# Patient Record
Sex: Male | Born: 1960
Health system: Southern US, Community
[De-identification: ages and names within clinical notes are randomized; demographics above are authoritative.]

## PROBLEM LIST (undated history)

## (undated) ENCOUNTER — Emergency Department (HOSPITAL_COMMUNITY)
Admission: EM | Disposition: A | Discharge status: Home / Self Care | Payer: Self-pay | Source: Home / Self Care | Attending: Emergency Medicine | Admitting: Emergency Medicine

## (undated) DIAGNOSIS — G4730 Sleep apnea, unspecified: Secondary | ICD-10-CM

## (undated) DIAGNOSIS — K219 Gastro-esophageal reflux disease without esophagitis: Secondary | ICD-10-CM

## (undated) DIAGNOSIS — R05 Cough: Secondary | ICD-10-CM

## (undated) DIAGNOSIS — K5900 Constipation, unspecified: Secondary | ICD-10-CM

## (undated) DIAGNOSIS — K429 Umbilical hernia without obstruction or gangrene: Secondary | ICD-10-CM

## (undated) DIAGNOSIS — R059 Cough, unspecified: Secondary | ICD-10-CM

## (undated) DIAGNOSIS — R531 Weakness: Secondary | ICD-10-CM

## (undated) DIAGNOSIS — R51 Headache: Secondary | ICD-10-CM

## (undated) DIAGNOSIS — R519 Headache, unspecified: Secondary | ICD-10-CM

## (undated) DIAGNOSIS — R062 Wheezing: Secondary | ICD-10-CM

## (undated) DIAGNOSIS — Z8719 Personal history of other diseases of the digestive system: Secondary | ICD-10-CM

## (undated) DIAGNOSIS — I10 Essential (primary) hypertension: Secondary | ICD-10-CM

## (undated) DIAGNOSIS — B009 Herpesviral infection, unspecified: Secondary | ICD-10-CM

## (undated) DIAGNOSIS — R011 Cardiac murmur, unspecified: Secondary | ICD-10-CM

## (undated) DIAGNOSIS — R7303 Prediabetes: Secondary | ICD-10-CM

## (undated) HISTORY — DX: Wheezing: R06.2

## (undated) HISTORY — DX: Umbilical hernia without obstruction or gangrene: K42.9

## (undated) HISTORY — DX: Weakness: R53.1

## (undated) HISTORY — DX: Gastro-esophageal reflux disease without esophagitis: K21.9

## (undated) HISTORY — PX: TOE SURGERY: SHX1073

## (undated) HISTORY — DX: Headache: R51

## (undated) HISTORY — PX: FEMUR SURGERY: SHX943

## (undated) HISTORY — DX: Headache, unspecified: R51.9

## (undated) HISTORY — PX: CARPAL TUNNEL RELEASE: SHX101

## (undated) HISTORY — DX: Essential (primary) hypertension: I10

## (undated) HISTORY — DX: Constipation, unspecified: K59.00

## (undated) HISTORY — PX: KNEE ARTHROSCOPY: SUR90

## (undated) HISTORY — DX: Cough, unspecified: R05.9

## (undated) HISTORY — DX: Cough: R05

---

## 1998-06-30 ENCOUNTER — Encounter: Payer: Self-pay | Admitting: Emergency Medicine

## 1998-06-30 ENCOUNTER — Emergency Department (HOSPITAL_COMMUNITY): Admission: EM | Admit: 1998-06-30 | Discharge: 1998-06-30 | Payer: Self-pay | Admitting: Emergency Medicine

## 2003-03-02 ENCOUNTER — Encounter: Admission: RE | Admit: 2003-03-02 | Discharge: 2003-03-02 | Payer: Self-pay | Admitting: Emergency Medicine

## 2003-03-10 ENCOUNTER — Encounter: Admission: RE | Admit: 2003-03-10 | Discharge: 2003-03-10 | Payer: Self-pay | Admitting: Emergency Medicine

## 2003-03-16 ENCOUNTER — Encounter: Admission: RE | Admit: 2003-03-16 | Discharge: 2003-03-16 | Payer: Self-pay | Admitting: Emergency Medicine

## 2003-07-20 ENCOUNTER — Encounter: Admission: RE | Admit: 2003-07-20 | Discharge: 2003-07-20 | Payer: Self-pay | Admitting: Emergency Medicine

## 2003-11-25 ENCOUNTER — Encounter: Admission: RE | Admit: 2003-11-25 | Discharge: 2003-11-25 | Payer: Self-pay | Admitting: Emergency Medicine

## 2004-02-09 ENCOUNTER — Ambulatory Visit: Payer: Self-pay | Admitting: Psychology

## 2004-02-28 ENCOUNTER — Ambulatory Visit: Payer: Self-pay | Admitting: Psychology

## 2004-03-13 ENCOUNTER — Ambulatory Visit: Payer: Self-pay | Admitting: Psychology

## 2004-03-26 ENCOUNTER — Ambulatory Visit: Payer: Self-pay | Admitting: Psychology

## 2004-04-12 ENCOUNTER — Ambulatory Visit: Payer: Self-pay | Admitting: Psychology

## 2004-04-28 ENCOUNTER — Ambulatory Visit: Payer: Self-pay | Admitting: Psychology

## 2004-05-04 ENCOUNTER — Ambulatory Visit: Payer: Self-pay | Admitting: Psychology

## 2004-05-18 ENCOUNTER — Ambulatory Visit: Payer: Self-pay | Admitting: Psychology

## 2004-06-28 ENCOUNTER — Ambulatory Visit: Payer: Self-pay | Admitting: Psychology

## 2004-07-12 ENCOUNTER — Ambulatory Visit: Payer: Self-pay | Admitting: Psychology

## 2004-07-26 ENCOUNTER — Ambulatory Visit: Payer: Self-pay | Admitting: Psychology

## 2004-08-09 ENCOUNTER — Ambulatory Visit: Payer: Self-pay | Admitting: Psychology

## 2004-08-23 ENCOUNTER — Ambulatory Visit: Payer: Self-pay | Admitting: Psychology

## 2004-09-20 ENCOUNTER — Ambulatory Visit: Payer: Self-pay | Admitting: Psychology

## 2004-10-04 ENCOUNTER — Ambulatory Visit: Payer: Self-pay | Admitting: Psychology

## 2004-10-18 ENCOUNTER — Ambulatory Visit: Payer: Self-pay | Admitting: Psychology

## 2004-11-01 ENCOUNTER — Ambulatory Visit: Payer: Self-pay | Admitting: Psychology

## 2004-11-15 ENCOUNTER — Ambulatory Visit: Payer: Self-pay | Admitting: Psychology

## 2004-11-29 ENCOUNTER — Ambulatory Visit: Payer: Self-pay | Admitting: Psychology

## 2004-12-13 ENCOUNTER — Ambulatory Visit: Payer: Self-pay | Admitting: Psychology

## 2005-01-10 ENCOUNTER — Ambulatory Visit: Payer: Self-pay | Admitting: Psychology

## 2005-03-07 ENCOUNTER — Ambulatory Visit: Payer: Self-pay | Admitting: Psychology

## 2005-04-04 ENCOUNTER — Ambulatory Visit: Payer: Self-pay | Admitting: Psychology

## 2005-04-29 ENCOUNTER — Encounter: Admission: RE | Admit: 2005-04-29 | Discharge: 2005-04-29 | Payer: Self-pay | Admitting: Emergency Medicine

## 2005-05-02 ENCOUNTER — Ambulatory Visit: Payer: Self-pay | Admitting: Psychology

## 2005-06-20 ENCOUNTER — Ambulatory Visit: Payer: Self-pay | Admitting: Psychology

## 2005-07-18 ENCOUNTER — Ambulatory Visit: Payer: Self-pay | Admitting: Psychology

## 2005-08-15 ENCOUNTER — Ambulatory Visit: Payer: Self-pay | Admitting: Psychology

## 2005-09-19 ENCOUNTER — Ambulatory Visit: Payer: Self-pay | Admitting: Psychology

## 2005-10-17 ENCOUNTER — Ambulatory Visit: Payer: Self-pay | Admitting: Psychology

## 2005-12-12 ENCOUNTER — Ambulatory Visit: Payer: Self-pay | Admitting: Psychology

## 2006-01-23 ENCOUNTER — Ambulatory Visit: Payer: Self-pay | Admitting: Psychology

## 2006-02-20 ENCOUNTER — Ambulatory Visit: Payer: Self-pay | Admitting: Psychology

## 2006-03-27 ENCOUNTER — Ambulatory Visit: Payer: Self-pay | Admitting: Psychology

## 2007-03-01 ENCOUNTER — Emergency Department (HOSPITAL_COMMUNITY): Admission: EM | Admit: 2007-03-01 | Discharge: 2007-03-01 | Payer: Self-pay | Admitting: Emergency Medicine

## 2008-06-21 ENCOUNTER — Emergency Department (HOSPITAL_COMMUNITY): Admission: EM | Admit: 2008-06-21 | Discharge: 2008-06-21 | Payer: Self-pay | Admitting: Family Medicine

## 2009-04-27 ENCOUNTER — Encounter: Payer: Self-pay | Admitting: Cardiovascular Disease

## 2009-12-13 ENCOUNTER — Emergency Department (HOSPITAL_COMMUNITY): Admission: EM | Admit: 2009-12-13 | Discharge: 2009-12-13 | Payer: Self-pay | Admitting: Family Medicine

## 2010-02-25 ENCOUNTER — Emergency Department (HOSPITAL_COMMUNITY)
Admission: EM | Admit: 2010-02-25 | Discharge: 2010-02-25 | Payer: Self-pay | Source: Home / Self Care | Admitting: Emergency Medicine

## 2010-05-07 LAB — CBC
HCT: 48.4 % (ref 39.0–52.0)
MCHC: 34.9 g/dL (ref 30.0–36.0)
MCV: 90 fL (ref 78.0–100.0)
RBC: 5.38 MIL/uL (ref 4.22–5.81)
RDW: 14.8 % (ref 11.5–15.5)
WBC: 7.9 10*3/uL (ref 4.0–10.5)

## 2010-05-07 LAB — DIFFERENTIAL
Basophils Absolute: 0 10*3/uL (ref 0.0–0.1)
Basophils Relative: 0 % (ref 0–1)
Monocytes Absolute: 0.7 10*3/uL (ref 0.1–1.0)
Neutro Abs: 4.2 10*3/uL (ref 1.7–7.7)

## 2010-05-07 LAB — POCT I-STAT, CHEM 8
Chloride: 110 mEq/L (ref 96–112)
Glucose, Bld: 99 mg/dL (ref 70–99)
HCT: 51 % (ref 39.0–52.0)
Potassium: 3.8 mEq/L (ref 3.5–5.1)
Sodium: 145 mEq/L (ref 135–145)

## 2010-05-07 LAB — RAPID STREP SCREEN (MED CTR MEBANE ONLY): Streptococcus, Group A Screen (Direct): NEGATIVE

## 2010-05-09 LAB — POCT URINALYSIS DIPSTICK
Bilirubin Urine: NEGATIVE
Glucose, UA: NEGATIVE mg/dL
Ketones, ur: NEGATIVE mg/dL
Nitrite: NEGATIVE
Specific Gravity, Urine: 1.025 (ref 1.005–1.030)
pH: 5.5 (ref 5.0–8.0)

## 2010-07-05 ENCOUNTER — Emergency Department (HOSPITAL_COMMUNITY)
Admission: EM | Admit: 2010-07-05 | Discharge: 2010-07-05 | Disposition: A | Discharge status: Home / Self Care | Payer: Self-pay | Attending: Emergency Medicine | Admitting: Emergency Medicine

## 2010-07-05 DIAGNOSIS — X500XXA Overexertion from strenuous movement or load, initial encounter: Secondary | ICD-10-CM | POA: Insufficient documentation

## 2010-07-05 DIAGNOSIS — IMO0001 Reserved for inherently not codable concepts without codable children: Secondary | ICD-10-CM | POA: Insufficient documentation

## 2010-07-05 DIAGNOSIS — M25519 Pain in unspecified shoulder: Secondary | ICD-10-CM | POA: Insufficient documentation

## 2010-07-05 DIAGNOSIS — IMO0002 Reserved for concepts with insufficient information to code with codable children: Secondary | ICD-10-CM | POA: Insufficient documentation

## 2010-07-05 DIAGNOSIS — M2550 Pain in unspecified joint: Secondary | ICD-10-CM | POA: Insufficient documentation

## 2010-11-21 ENCOUNTER — Emergency Department (HOSPITAL_COMMUNITY)
Admission: EM | Admit: 2010-11-21 | Discharge: 2010-11-22 | Disposition: A | Discharge status: Home / Self Care | Payer: Self-pay | Attending: Emergency Medicine | Admitting: Emergency Medicine

## 2010-11-21 DIAGNOSIS — M7120 Synovial cyst of popliteal space [Baker], unspecified knee: Secondary | ICD-10-CM | POA: Insufficient documentation

## 2010-11-21 DIAGNOSIS — M25569 Pain in unspecified knee: Secondary | ICD-10-CM | POA: Insufficient documentation

## 2010-11-22 ENCOUNTER — Emergency Department (HOSPITAL_COMMUNITY): Payer: Self-pay

## 2010-12-13 ENCOUNTER — Emergency Department (HOSPITAL_COMMUNITY)
Admission: EM | Admit: 2010-12-13 | Discharge: 2010-12-13 | Disposition: A | Discharge status: Home / Self Care | Payer: Self-pay | Attending: Emergency Medicine | Admitting: Emergency Medicine

## 2010-12-13 DIAGNOSIS — M7120 Synovial cyst of popliteal space [Baker], unspecified knee: Secondary | ICD-10-CM | POA: Insufficient documentation

## 2010-12-13 DIAGNOSIS — M25569 Pain in unspecified knee: Secondary | ICD-10-CM | POA: Insufficient documentation

## 2010-12-13 DIAGNOSIS — M25469 Effusion, unspecified knee: Secondary | ICD-10-CM | POA: Insufficient documentation

## 2010-12-13 DIAGNOSIS — Z79899 Other long term (current) drug therapy: Secondary | ICD-10-CM | POA: Insufficient documentation

## 2010-12-16 ENCOUNTER — Emergency Department (HOSPITAL_COMMUNITY)
Admission: EM | Admit: 2010-12-16 | Discharge: 2010-12-17 | Disposition: A | Discharge status: Home / Self Care | Payer: Self-pay | Attending: Emergency Medicine | Admitting: Emergency Medicine

## 2010-12-16 DIAGNOSIS — R079 Chest pain, unspecified: Secondary | ICD-10-CM | POA: Insufficient documentation

## 2010-12-16 DIAGNOSIS — F17200 Nicotine dependence, unspecified, uncomplicated: Secondary | ICD-10-CM | POA: Insufficient documentation

## 2010-12-16 DIAGNOSIS — Z79899 Other long term (current) drug therapy: Secondary | ICD-10-CM | POA: Insufficient documentation

## 2010-12-17 ENCOUNTER — Emergency Department (HOSPITAL_COMMUNITY): Payer: Self-pay

## 2010-12-17 LAB — BASIC METABOLIC PANEL
CO2: 24 mEq/L (ref 19–32)
Calcium: 9.8 mg/dL (ref 8.4–10.5)
Chloride: 104 mEq/L (ref 96–112)
GFR calc Af Amer: 79 mL/min — ABNORMAL LOW (ref >90)
GFR calc non Af Amer: 68 mL/min — ABNORMAL LOW (ref >90)
Sodium: 138 mEq/L (ref 135–145)

## 2010-12-17 LAB — CBC
Hemoglobin: 16.4 g/dL (ref 13.0–17.0)
MCH: 30.7 pg (ref 26.0–34.0)
WBC: 8.3 10*3/uL (ref 4.0–10.5)

## 2010-12-17 LAB — DIFFERENTIAL
Basophils Absolute: 0 10*3/uL (ref 0.0–0.1)
Basophils Relative: 1 % (ref 0–1)
Eosinophils Absolute: 0.3 10*3/uL (ref 0.0–0.7)
Eosinophils Relative: 4 % (ref 0–5)
Lymphs Abs: 3.6 10*3/uL (ref 0.7–4.0)
Monocytes Relative: 7 % (ref 3–12)
Neutro Abs: 3.8 10*3/uL (ref 1.7–7.7)

## 2010-12-17 LAB — PROTIME-INR
INR: 0.9 (ref 0.00–1.49)
Prothrombin Time: 12.3 seconds (ref 11.6–15.2)

## 2010-12-17 LAB — POCT I-STAT TROPONIN I: Troponin i, poc: 0 ng/mL (ref 0.00–0.08)

## 2011-05-11 ENCOUNTER — Emergency Department (HOSPITAL_COMMUNITY)
Admission: EM | Admit: 2011-05-11 | Discharge: 2011-05-11 | Disposition: A | Discharge status: Home / Self Care | Payer: Self-pay | Attending: Emergency Medicine | Admitting: Emergency Medicine

## 2011-05-11 ENCOUNTER — Encounter (HOSPITAL_COMMUNITY): Payer: Self-pay | Admitting: *Deleted

## 2011-05-11 DIAGNOSIS — Z79899 Other long term (current) drug therapy: Secondary | ICD-10-CM | POA: Insufficient documentation

## 2011-05-11 DIAGNOSIS — M25569 Pain in unspecified knee: Secondary | ICD-10-CM | POA: Insufficient documentation

## 2011-05-11 DIAGNOSIS — Z9889 Other specified postprocedural states: Secondary | ICD-10-CM | POA: Insufficient documentation

## 2011-05-11 DIAGNOSIS — M25561 Pain in right knee: Secondary | ICD-10-CM

## 2011-05-11 DIAGNOSIS — F17200 Nicotine dependence, unspecified, uncomplicated: Secondary | ICD-10-CM | POA: Insufficient documentation

## 2011-05-11 MED ORDER — NAPROXEN 375 MG PO TABS: 375.0000 mg | ORAL_TABLET | Freq: Two times a day (BID) | ORAL | Status: DC

## 2011-05-11 MED ORDER — IBUPROFEN 800 MG PO TABS
800.0000 mg | ORAL_TABLET | Freq: Once | ORAL | Status: AC
  Administered 2011-05-11: 800 mg via ORAL
  Filled 2011-05-11: qty 1

## 2011-05-11 MED ORDER — ACETAMINOPHEN-CODEINE #3 300-30 MG PO TABS
1.0000 | ORAL_TABLET | Freq: Four times a day (QID) | ORAL | Status: AC | PRN
Start: 2011-05-11 — End: 2011-05-21

## 2011-05-11 NOTE — ED Provider Notes (Signed)
History     CSN: [id number]  Arrival date & time 05/11/11  0805   First MD Initiated Contact with Patient 05/11/11 [phone]      Chief Complaint  Patient presents with  . Knee Pain    (Consider location/radiation/quality/duration/timing/severity/associated sxs/prior treatment) HPI  Patient presents to ER with complaint of a multiple month hx of intermittent right posterior knee pain. Patient states he notices the pain the most after he walks on his treadmill for long periods of time or has to climb the hill to his mail box. Patient states pain is the same pain that he was having in September of 2012 when he had US showing Baker's cyst of right knee but states at times the severity of pain increases but he correlates the severity of pain the the amount of movement/use of knee. Patient denies any swelling or redness of knee and denies pain or swelling into calf or thigh. Patient has taken an occasional advil with mild relief of pain. He states he is currently unemployeed and therefore did not follow up with ortho as directed in September. He denies any CP or SOB. Pain aggravated by walking and use and improved with rest and NSAID. States he has no other known medical problems.   History reviewed. No pertinent past medical history.  Past Surgical History  Procedure Date  . Femur surgery     left    No family history on file.  History  Substance Use Topics  . Smoking status: Current Everyday Smoker  . Smokeless tobacco: Not on file  . Alcohol Use: Yes      Review of Systems  All other systems reviewed and are negative.    Allergies  Review of patient's allergies indicates no known allergies.  Home Medications   Current Outpatient Rx  Name Route Sig Dispense Refill  . ACYCLOVIR PO Oral Take 1 capsule by mouth 2 (two) times daily as needed. When outbreak occurs    . ALEVE PO Oral Take 1 tablet by mouth daily as needed. For pain    . ACETAMINOPHEN-CODEINE #3 300-30 MG PO TABS  Oral Take 1-2 tablets by mouth every 6 (six) hours as needed for pain. 15 tablet 0  . NAPROXEN 375 MG PO TABS Oral Take 1 tablet (375 mg total) by mouth 2 (two) times daily. 20 tablet 0    BP 127/85  Pulse 77  Temp(Src) 97.8 F (36.6 C) (Oral)  Resp 16  SpO2 95%  Physical Exam  Nursing note and vitals reviewed. Constitutional: He is oriented to person, place, and time. He appears well-developed.  HENT:  Head: Normocephalic and atraumatic.  Eyes: Conjunctivae are normal.  Neck: Normal range of motion. Neck supple.  Cardiovascular: Normal rate.   Pulmonary/Chest: Effort normal.  Musculoskeletal: Normal range of motion. He exhibits tenderness. He exhibits no edema.       Mild TTP of right lateral popliteal fossa but no fullness. Mild pain in lateral posterior knee with FROM but no crepitous. No edema, erythema or skin changes of knee. No TTP of entire right thigh and calf. Good pedal pulse bilaterally.   Neurological: He is alert and oriented to person, place, and time.  Skin: Skin is warm and dry. No rash noted. No erythema. No pallor.    ED Course  Procedures (including critical care time)  PO ibuprofen.   Labs Reviewed - No data to display No results found.   1. Right knee pain       MDM  Patient with hx of multiple month hx of intermittent right knee pain with popliteal fossa with US showing baker's cyst in 9/12 with patient complaining of the same recurrent pain that is aggravated by over use like walking on treadmill and climbing hills. No personal risk for DVT formation with no TTP or swelling of calf. Knee has no edema and no worrisome signs or symptoms of infection. Patient agreeable to NSAID use, ice and decreasing aggravating over use injury then follow up with ortho for ongoing pain recheck.         Lenon Oms [place], [place] 05/11/11 [phone]

## 2011-05-11 NOTE — ED Notes (Signed)
Pt is here with behind the right knee pain.    Pt states hard to walk yesterday and states has been bothering him for one month.

## 2011-05-11 NOTE — Discharge Instructions (Signed)
Use naprosyn as directed for inflammation and pain with tylenol #3 for breakthrough pain but do not drive or operate machinery with Tylenol #3 use. Ice knee throughout the day for additional pain relief and to reduce inflammation. Follow up with orthopedic specialist in the next 2-3 weeks for further evaluation and management of ongoing knee pain but return to ER for emergent changing or worsening of symptoms.   Knee Pain The knee is the complex joint between your thigh and your lower leg. It is made up of bones, tendons, ligaments, and cartilage. The bones that make up the knee are:  The femur in the thigh.   The tibia and fibula in the lower leg.   The patella or kneecap riding in the groove on the lower femur.  CAUSES  Knee pain is a common complaint with many causes. A few of these causes are:  Injury, such as:   A ruptured ligament or tendon injury.   Torn cartilage.   Medical conditions, such as:   Gout   Arthritis   Infections   Overuse, over training or overdoing a physical activity.  Knee pain can be minor or severe. Knee pain can accompany debilitating injury. Minor knee problems often respond well to self-care measures or get well on their own. More serious injuries may need medical intervention or even surgery. SYMPTOMS The knee is complex. Symptoms of knee problems can vary widely. Some of the problems are:  Pain with movement and weight bearing.   Swelling and tenderness.   Buckling of the knee.   Inability to straighten or extend your knee.   Your knee locks and you cannot straighten it.   Warmth and redness with pain and fever.   Deformity or dislocation of the kneecap.  DIAGNOSIS  Determining what is wrong may be very straight forward such as when there is an injury. It can also be challenging because of the complexity of the knee. Tests to make a diagnosis may include:  Your caregiver taking a history and doing a physical exam.   Routine X-rays can  be used to rule out other problems. X-rays will not reveal a cartilage tear. Some injuries of the knee can be diagnosed by:   Arthroscopy a surgical technique by which a small video camera is inserted through tiny incisions on the sides of the knee. This procedure is used to examine and repair internal knee joint problems. Tiny instruments can be used during arthroscopy to repair the torn knee cartilage (meniscus).   Arthrography is a radiology technique. A contrast liquid is directly injected into the knee joint. Internal structures of the knee joint then become visible on X-ray film.   An MRI scan is a non x-ray radiology procedure in which magnetic fields and a computer produce two- or three-dimensional images of the inside of the knee. Cartilage tears are often visible using an MRI scanner. MRI scans have largely replaced arthrography in diagnosing cartilage tears of the knee.   Blood work.   Examination of the fluid that helps to lubricate the knee joint (synovial fluid). This is done by taking a sample out using a needle and a syringe.  TREATMENT The treatment of knee problems depends on the cause. Some of these treatments are:  Depending on the injury, proper casting, splinting, surgery or physical therapy care will be needed.   Give yourself adequate recovery time. Do not overuse your joints. If you begin to get sore during workout routines, back off. Slow down or  do fewer repetitions.   For repetitive activities such as cycling or running, maintain your strength and nutrition.   Alternate muscle groups. For example if you are a weight lifter, work the upper body on one day and the lower body the next.   Either tight or weak muscles do not give the proper support for your knee. Tight or weak muscles do not absorb the stress placed on the knee joint. Keep the muscles surrounding the knee strong.   Take care of mechanical problems.   If you have flat feet, orthotics or special shoes  may help. See your caregiver if you need help.   Arch supports, sometimes with wedges on the inner or outer aspect of the heel, can help. These can shift pressure away from the side of the knee most bothered by osteoarthritis.   A brace called an "unloader" brace also may be used to help ease the pressure on the most arthritic side of the knee.   If your caregiver has prescribed crutches, braces, wraps or ice, use as directed. The acronym for this is PRICE. This means protection, rest, ice, compression and elevation.   Nonsteroidal anti-inflammatory drugs (NSAID's), can help relieve pain. But if taken immediately after an injury, they may actually increase swelling. Take NSAID's with food in your stomach. Stop them if you develop stomach problems. Do not take these if you have a history of ulcers, stomach pain or bleeding from the bowel. Do not take without your caregiver's approval if you have problems with fluid retention, heart failure, or kidney problems.   For ongoing knee problems, physical therapy may be helpful.   Glucosamine and chondroitin are over-the-counter dietary supplements. Both may help relieve the pain of osteoarthritis in the knee. These medicines are different from the usual anti-inflammatory drugs. Glucosamine may decrease the rate of cartilage destruction.   Injections of a corticosteroid drug into your knee joint may help reduce the symptoms of an arthritis flare-up. They may provide pain relief that lasts a few months. You may have to wait a few months between injections. The injections do have a small increased risk of infection, water retention and elevated blood sugar levels.   Hyaluronic acid injected into damaged joints may ease pain and provide lubrication. These injections may work by reducing inflammation. A series of shots may give relief for as long as 6 months.   Topical painkillers. Applying certain ointments to your skin may help relieve the pain and stiffness  of osteoarthritis. Ask your pharmacist for suggestions. Many over the-counter products are approved for temporary relief of arthritis pain.   In some countries, doctors often prescribe topical NSAID's for relief of chronic conditions such as arthritis and tendinitis. A review of treatment with NSAID creams found that they worked as well as oral medications but without the serious side effects.  PREVENTION  Maintain a healthy weight. Extra pounds put more strain on your joints.   Get strong, stay limber. Weak muscles are a common cause of knee injuries. Stretching is important. Include flexibility exercises in your workouts.   Be smart about exercise. If you have osteoarthritis, chronic knee pain or recurring injuries, you may need to change the way you exercise. This does not mean you have to stop being active. If your knees ache after jogging or playing basketball, consider switching to swimming, water aerobics or other low-impact activities, at least for a few days a week. Sometimes limiting high-impact activities will provide relief.   Make sure your  shoes fit well. Choose footwear that is right for your sport.   Protect your knees. Use the proper gear for knee-sensitive activities. Use kneepads when playing volleyball or laying carpet. Buckle your seat belt every time you drive. Most shattered kneecaps occur in car accidents.   Rest when you are tired.  SEEK MEDICAL CARE IF:  You have knee pain that is continual and does not seem to be getting better.  SEEK IMMEDIATE MEDICAL CARE IF:  Your knee joint feels hot to the touch and you have a high fever. MAKE SURE YOU:   Understand these instructions.   Will watch your condition.   Will get help right away if you are not doing well or get worse.  Document Released: 12/09/2006 Document Revised: 01/31/2011 Document Reviewed: 12/09/2006 [facility] Patient Information 2012 [place], [place].

## 2011-05-12 NOTE — ED Provider Notes (Signed)
Medical screening examination/treatment/procedure(s) were performed by non-physician practitioner and as supervising physician I was immediately available for consultation/collaboration.  Rmoni Keplinger R. Glayds [place], MD 05/12/11 [id number] 

## 2011-06-09 ENCOUNTER — Emergency Department (HOSPITAL_COMMUNITY)
Admission: EM | Admit: 2011-06-09 | Discharge: 2011-06-09 | Disposition: A | Discharge status: Home / Self Care | Payer: Self-pay | Attending: Emergency Medicine | Admitting: Emergency Medicine

## 2011-06-09 ENCOUNTER — Encounter (HOSPITAL_COMMUNITY): Payer: Self-pay | Admitting: *Deleted

## 2011-06-09 DIAGNOSIS — M79609 Pain in unspecified limb: Secondary | ICD-10-CM | POA: Insufficient documentation

## 2011-06-09 DIAGNOSIS — F17200 Nicotine dependence, unspecified, uncomplicated: Secondary | ICD-10-CM | POA: Insufficient documentation

## 2011-06-09 DIAGNOSIS — R209 Unspecified disturbances of skin sensation: Secondary | ICD-10-CM | POA: Insufficient documentation

## 2011-06-09 DIAGNOSIS — G5600 Carpal tunnel syndrome, unspecified upper limb: Secondary | ICD-10-CM | POA: Insufficient documentation

## 2011-06-09 MED ORDER — NAPROXEN 375 MG PO TABS: 375.0000 mg | ORAL_TABLET | Freq: Two times a day (BID) | ORAL | Status: DC

## 2011-06-09 MED ORDER — IBUPROFEN 200 MG PO TABS
400.0000 mg | ORAL_TABLET | Freq: Once | ORAL | Status: AC
  Administered 2011-06-09: 400 mg via ORAL
  Filled 2011-06-09: qty 2

## 2011-06-09 MED ORDER — PREDNISONE 20 MG PO TABS
40.0000 mg | ORAL_TABLET | Freq: Once | ORAL | Status: AC
  Administered 2011-06-09: 40 mg via ORAL
  Filled 2011-06-09: qty 2

## 2011-06-09 MED ORDER — PREDNISONE 20 MG PO TABS: 20.0000 mg | ORAL_TABLET | Freq: Every day | ORAL | Status: AC

## 2011-06-09 NOTE — ED Provider Notes (Signed)
Medical screening examination/treatment/procedure(s) were performed by non-physician practitioner and as supervising physician I was immediately available for consultation/collaboration.   Vida Roller, MD 06/09/11 [phone]

## 2011-06-09 NOTE — ED Provider Notes (Signed)
History     CSN: [id number]  Arrival date & time 06/09/11  5621   First MD Initiated Contact with Patient 06/09/11 [phone]      Chief Complaint  Patient presents with  . Hand Pain    (Consider location/radiation/quality/duration/timing/severity/associated sxs/prior treatment) HPI Comments: Patient with no significant medical history presents the emergency department with chief complaint of numbness and tingling in his right hand.  Location of tingling is the middle ring and pinky fingers.  Onset of pain is about one week ago.  Patient denies associated symptoms or injury. Patient has full range of motion but states that certain maintenance induced tingling.  In addition patient states that he feels like he's been breathing funny lately but he denies any chest pain, shortness of breath, back pain, tingling of lower extremities, neck pain, neck stiffness, fevers, night sweats, chills, IV drug use, cancer history.  Patient is a current everyday smoker.  No other complaints this time.  Note that on further questioning patient states that in his right hand he has had a history of carpal tunnel that was treated with a brace years ago, but he has never had surgery.  Patient is a 51 y.o. male presenting with hand pain. The history is provided by the patient.  Hand Pain Pertinent negatives include no abdominal pain, chest pain, chills, congestion, fever, headaches, numbness or weakness.    History reviewed. No pertinent past medical history.  Past Surgical History  Procedure Date  . Femur surgery     left    History reviewed. No pertinent family history.  History  Substance Use Topics  . Smoking status: Current Everyday Smoker  . Smokeless tobacco: Not on file  . Alcohol Use: Yes      Review of Systems  Constitutional: Negative for fever, chills and appetite change.  HENT: Negative for congestion.   Eyes: Negative for visual disturbance.  Respiratory: Negative for shortness of  breath.   Cardiovascular: Negative for chest pain and leg swelling.  Gastrointestinal: Negative for abdominal pain.  Genitourinary: Negative for dysuria, urgency and frequency.  Neurological: Negative for dizziness, syncope, weakness, light-headedness, numbness and headaches.  Psychiatric/Behavioral: Negative for confusion.  All other systems reviewed and are negative.    Allergies  Review of patient's allergies indicates no known allergies.  Home Medications   Current Outpatient Rx  Name Route Sig Dispense Refill  . ACYCLOVIR PO Oral Take 1 capsule by mouth 2 (two) times daily as needed. When outbreak occurs. Patient does not know strength.    Marland Kitchen DOXAZOSIN MESYLATE 2 MG PO TABS Oral Take 2 mg by mouth daily.    Marland Kitchen VICODIN PO Oral Take 1 tablet by mouth daily as needed. For pain    . SULFAMETHOXAZOLE-TMP DS 800-160 MG PO TABS Oral Take 1 tablet by mouth 2 (two) times daily. Took for 10 days ending 06/03/11      BP 143/93  Pulse 80  Temp(Src) 98.4 F (36.9 C) (Oral)  Resp 18  SpO2 98%  Physical Exam  Nursing note and vitals reviewed. Constitutional: He is oriented to person, place, and time. He appears well-developed and well-nourished. No distress.  HENT:  Head: Normocephalic and atraumatic.  Eyes: Conjunctivae and EOM are normal.  Neck: Normal range of motion.  Pulmonary/Chest: Effort normal.  Musculoskeletal: Normal range of motion.       Positive Tinel and Phelan sign of right hand, median nerve distribution tingling and numbness. Sensation intact. No deformity  Neurological: He is alert and  oriented to person, place, and time.  Skin: Skin is warm and dry. No rash noted. He is not diaphoretic.  Psychiatric: He has a normal mood and affect. His behavior is normal.    ED Course  Procedures (including critical care time)  Labs Reviewed - No data to display No results found.   No diagnosis found.  The patient denies any neck pain. There is no tenderness on palpation  of the cervical spine and no step-offs. The patient can look to the left and right voluntarily without pain and flex and extend the neck without pain. Cervical collar cleared.    MDM  Carpal tunnel   Pt given a volar Velcro splint.  He is being given a prescription for prednisone, anti-inflammatories, and splint.  Patient advised that if his symptoms do not resolve in one to 2 weeks that he can follow up with orthopedics to discuss possible surgery.  Patient is agreeable with plan.  Patient without evidence of neck injury and C-spine cleared.  Likely diagnosis is median nerve compression and not cervical spine related.  Patient with vital signs stable agreeable to plan.        Jaci Carrel, [place] 06/09/11 [phone]

## 2011-06-09 NOTE — ED Notes (Signed)
Ortho at bedside.

## 2011-06-09 NOTE — ED Notes (Signed)
Patient states that he had onset of pain to his right hand and numbness to finger tips x 1 week. Pt states that numbness will go into his FA as well.  Pt denies any injury.  Pt has not trauma or swelling noted.  Pt has full ROM.

## 2011-06-09 NOTE — Progress Notes (Signed)
Orthopedic Tech Progress Note Patient Details:  Samuel Irwin [birth year] [id number]  Type of Splint: Thumb velcro Splint Location: right hand Splint Interventions: Application    Saundra Gin T 06/09/2011, 8:33 AM

## 2011-06-09 NOTE — Discharge Instructions (Signed)
Carpal Tunnel Syndrome You may have carpal tunnel syndrome. This is a common condition. Carpal tunnel syndrome occurs when the tendons, bones, or ligaments in the wrist press against the median nerve as it passes into the hand.  Symptoms can include:  Intermittent numbness.   Pain or a tingling sensation in thumb and first two fingers.  The pain may radiate up to the shoulder. There may even be weakness in the hand muscles. The pain is often worse at night and in the early morning. Nerve conduction tests may be used to prove the diagnosis. Carpal tunnel syndrome is most often due to repeated movements of the hand or wrist. Other causes can include:  Prior injuries.   Diabetes.   Obesity.   Smoking.   Pregnancy. Symptoms that develop during pregnancy often stop when the pregnancy is over.  Treatment includes:  Splinting - A wrist splint helps prevent movements that irritate the nerve. Splints are especially helpful at night when the symptoms are often worse.   Ice packs - Cold packs applied to the palm side of the wrist for 20 minutes every 2 hours while awake may give some relief.   Medication - Medicine to reduce inflammation and pain are often used. Cortisone injections around the nerve may also bring improvement.  Severe cases of carpal tunnel syndrome can require surgery to relieve the pressure on the nerve. This may be necessary if there is evidence of weakness or decreased sensation in your hand, or if your symptoms do not improve with conservative treatment. See your caregiver for follow-up to be certain your condition is improving. Document Released: 03/21/2004 Document Revised: 10/24/2010 Document Reviewed: 12/18/2006 [facility] Patient Information 2012 [place], [place].Carpal Tunnel Surgery The carpal tunnel is a narrow hollow area in the wrist. It is formed by the wrist bones and ligaments. Nerves, blood vessels, and tendons on the palm side of your hand pass through the carpal  tunnel. (The palm side is the side of your hand in the direction your fingers bend.) Repeated wrist motion or certain diseases may cause swelling within the tunnel. That is why these are sometimes called repetitive trauma disorders. It is also a common problem in late pregnancy because of water retention. This swelling pinches the main nerve in the wrist (median nerve). It causes the painful condition called carpal tunnel syndrome. A feeling of "pins and needles" may be noticed in the fingers or hand. The entire arm may ache from this condition. Carpal tunnel syndrome may clear up by itself. Cortisone injections may help. An electromyogram may be needed to confirm this diagnosis. This is a test which measures nerve conduction. The nerve conduction is usually slowed in a carpal tunnel syndrome. Sometimes, an operation may be needed to free the pinched nerve.  LET YOUR CAREGIVER KNOW ABOUT:  Allergies   Medications taken including herbs, eye drops, over the counter medications, and creams   Use of steroids (by mouth or creams)   Previous problems with anesthetics or novocaine   Possibility of pregnancy, if this applies   History of blood clots (thrombophlebitis)   History of bleeding or blood problems   Previous surgery   Other health problems  RISKS AND COMPLICATIONS  Infection: A germ starts growing in the wound. This can usually be treated with antibiotics.   Damage to other organs may occur.   Bleeding following surgery can be a complication of almost all surgeries. Your surgeon takes every precaution to keep this from happening.   Recurrence (return)  of carpal tunnel syndrome following treatment is rare.  BEFORE THE PROCEDURE  Stop smoking at least two weeks prior to surgery. This lowers risk during surgery.   Stop non steroidal medicine for ten days prior to surgery. Also, do not take aspirin unless OK'd by your surgeon.   Your caregiver may tell you to stop taking certain  medicine that may affect the outcome of the surgery and your ability to heal. For example, you may need to stop taking anti-inflammatories, such as aspirin, because of possible bleeding problems. Other medicine may have interactions with anesthesia.   BE SURE TO LET YOUR CAREGIVER KNOW IF YOU HAVE BEEN ON STEROIDS (INCLUDING CREAMS) FOR LONG PERIODS OF TIME. THIS IS CRITICAL.   Your caregiver will discuss possible risks and complications with you before surgery. In addition to the usual risks of anesthesia, other common risks and complications include:   Temporary increase in pain due to surgery.   Uncorrected pain.   Infection.  You should be present 60 minutes before your procedure or as directed.  PROCEDURE  Carpal tunnel release is generally recommended if symptoms last for 6 months. Surgery involves severing the band of tissue around the wrist to reduce pressure on the median nerve. Surgery is done under local anesthesia and does not require an overnight hospital stay. Many patients require surgery on both hands. The following are types of carpal tunnel release surgery:   Open release surgery, the traditional procedure used to correct carpal tunnel syndrome, consists of making an incision up to 2 inches in the wrist and then cutting the carpal ligament to enlarge the carpal tunnel. The procedure is generally done under local anesthesia on an outpatient basis, unless there are unusual medical considerations.   Endoscopic surgery may allow faster functional recovery and less post-operative discomfort than traditional open release surgery. The surgeon makes two incisions (cuts) (about 1/2 inch each) in the wrist and palm, inserts a camera attached to a tube, looks at the tissue on a screen, and cuts the carpal ligament (the tissue that holds joints together). This two-portal endoscopic surgery, generally performed under local anesthesia, is effective and minimizes scarring and scar tenderness, if  any. One-portal endoscopic surgery for carpal tunnel syndrome is also available.  Although symptoms may be better right after surgery, full recovery from carpal tunnel surgery can take months. Some patients may have infection, nerve damage, stiffness, and pain at the scar. Sometimes the wrist loses strength because the carpal ligament is cut. Patients should take part in physical therapy after surgery to restore wrist strength. Some patients may need to adjust job duties or even change jobs after recovery from surgery. The majority of patients recover completely without complications (additional problems). AFTER THE PROCEDURE After surgery, you will be taken to the recovery area where a nurse will watch and check your progress. Once you're awake, stable, and taking fluids well, without other problems you will be allowed to go home. HOME CARE INSTRUCTIONS   Once at home, an ice pack applied to your operative site may help with discomfort and keep the swelling down.   Follow your caregiver's instructions as to activities, exercises, physical therapy, and driving a car.   Maintain strength and range of motion as instructed.   If you were given a splint to keep your wrist from bending, use it as instructed. It is important to wear the splint at night. Use the splint for as long as you have pain or numbness in your hand,  arm or wrist. This may take 1 to 2 months.   If you have pain at night, it may help to elevate your hand above the level of your heart (the center of your chest).   It is important to avoid activities which originally caused your carpal tunnel syndrome for a couple weeks following surgery, or as directed by your surgeon. If your symptoms are work-related, you may need to talk to your employer about changing to a job that does not require using your wrist.   Only take over-the-counter or prescription medicines for pain, discomfort, or fever as directed by your caregiver.   Following  periods of extended use, particularly hard (strenuous) use, apply an ice pack wrapped in a towel to the palm (anterior) side of the affected wrist for 20 to 30 minutes. Repeat as needed three to four times per day. This will help reduce swelling following surgery.  SEEK MEDICAL CARE IF:   There is increased bleeding (more than a small spot) from the wound.   You notice redness, swelling, or increasing pain in the wound.   Pus is coming from wound.   An unexplained oral temperature above 102 F (38.9 C) develops.   You notice a foul smell coming from the wound or dressing.  SEEK IMMEDIATE MEDICAL CARE IF:   You develop a rash.   You have difficulty breathing.   You have any problems you think are related to allergies.  Document Released: 09/26/2003 Document Revised: 01/31/2011 Document Reviewed: 12/18/2006 [facility] Patient Information 2012 [place], [place].Carpal Tunnel Release Carpal tunnel release is done to relieve the pressure on the nerves and tendons on the bottom side of your wrist.  LET YOUR CAREGIVER KNOW ABOUT:   Allergies to food or medicine.   Medicines taken, including vitamins, herbs, eyedrops, over-the-counter medicines, and creams.   Use of steroids (by mouth or creams).   Previous problems with anesthetics or numbing medicines.   History of bleeding problems or blood clots.   Previous surgery.   Other health problems, including diabetes and kidney problems.   Possibility of pregnancy, if this applies.  RISKS AND COMPLICATIONS  Some problems that may happen after this procedure include:  Infection.   Damage to the nerves, arteries or tendons could occur. This would be very uncommon.   Bleeding.  BEFORE THE PROCEDURE   This surgery may be done while you are asleep (general anesthetic) or may be done under a block where only your forearm and the surgical area is numb.   If the surgery is done under a block, the numbness will gradually wear off within  several hours after surgery.  HOME CARE INSTRUCTIONS   Have a responsible person with you for 24 hours.   Do not drive a car or use public transportation for 24 hours.   Only take over-the-counter or prescription medicines for pain, discomfort, or fever as directed by your caregiver. Take them as directed.   You may put ice on the palm side of the affected wrist.   Put ice in a plastic bag.   Place a towel between your skin and the bag.   Leave the ice on for 20 to 30 minutes, 4 times per day.   If you were given a splint to keep your wrist from bending, use it as directed. It is important to wear the splint at night or as directed. Use the splint for as long as you have pain or numbness in your hand, arm, or wrist.  This may take 1 to 2 months.   Keep your hand raised (elevated) above the level of your heart as much as possible. This keeps swelling down and helps with discomfort.   Change bandages (dressings) as directed.   Keep the wound clean and dry.  SEEK MEDICAL CARE IF:   You develop pain not relieved with medications.   You develop numbness of your hand.   You develop bleeding from your surgical site.   You have an oral temperature above 102 F (38.9 C).   You develop redness or swelling of the surgical site.   You develop new, unexplained problems.  SEEK IMMEDIATE MEDICAL CARE IF:   You develop a rash.   You have difficulty breathing.   You develop any reaction or side effects to medications given.  Document Released: 05/04/2003 Document Revised: 01/31/2011 Document Reviewed: 12/18/2006 [facility] Patient Information 2012 [place], [place].

## 2011-06-09 NOTE — ED Notes (Signed)
PT reports he was diagnosed with carpal tunnel several years ago with an MRI. PT reports pain is similar and is interrupting his sleep.

## 2011-07-11 ENCOUNTER — Ambulatory Visit: Payer: Self-pay | Admitting: Psychology

## 2011-07-11 ENCOUNTER — Ambulatory Visit (INDEPENDENT_AMBULATORY_CARE_PROVIDER_SITE_OTHER): Payer: BC Managed Care – PPO | Admitting: Psychology

## 2011-07-11 DIAGNOSIS — F331 Major depressive disorder, recurrent, moderate: Secondary | ICD-10-CM

## 2011-07-15 ENCOUNTER — Ambulatory Visit: Payer: Self-pay | Admitting: Psychology

## 2011-07-25 ENCOUNTER — Ambulatory Visit (INDEPENDENT_AMBULATORY_CARE_PROVIDER_SITE_OTHER): Payer: BC Managed Care – PPO | Admitting: Psychology

## 2011-07-25 DIAGNOSIS — F331 Major depressive disorder, recurrent, moderate: Secondary | ICD-10-CM

## 2011-08-08 ENCOUNTER — Ambulatory Visit (INDEPENDENT_AMBULATORY_CARE_PROVIDER_SITE_OTHER): Payer: BC Managed Care – PPO | Admitting: Psychology

## 2011-08-08 DIAGNOSIS — F331 Major depressive disorder, recurrent, moderate: Secondary | ICD-10-CM

## 2011-08-22 ENCOUNTER — Ambulatory Visit: Payer: BC Managed Care – PPO | Admitting: Psychology

## 2011-09-04 ENCOUNTER — Ambulatory Visit (INDEPENDENT_AMBULATORY_CARE_PROVIDER_SITE_OTHER): Payer: BC Managed Care – PPO | Admitting: Psychology

## 2011-09-04 DIAGNOSIS — F331 Major depressive disorder, recurrent, moderate: Secondary | ICD-10-CM

## 2011-09-19 ENCOUNTER — Ambulatory Visit (INDEPENDENT_AMBULATORY_CARE_PROVIDER_SITE_OTHER): Payer: BC Managed Care – PPO | Admitting: Psychology

## 2011-09-19 DIAGNOSIS — F331 Major depressive disorder, recurrent, moderate: Secondary | ICD-10-CM

## 2012-01-17 ENCOUNTER — Emergency Department (HOSPITAL_COMMUNITY)
Admission: EM | Admit: 2012-01-17 | Discharge: 2012-01-17 | Disposition: A | Discharge status: Home / Self Care | Payer: Self-pay | Attending: Emergency Medicine | Admitting: Emergency Medicine

## 2012-01-17 DIAGNOSIS — F17200 Nicotine dependence, unspecified, uncomplicated: Secondary | ICD-10-CM | POA: Insufficient documentation

## 2012-01-17 DIAGNOSIS — K429 Umbilical hernia without obstruction or gangrene: Secondary | ICD-10-CM | POA: Insufficient documentation

## 2012-01-17 NOTE — ED Provider Notes (Signed)
History     CSN: [id number]  Arrival date & time 01/17/12  1050   First MD Initiated Contact with Patient 01/17/12 1059      Chief Complaint  Patient presents with  . Hernia    (Consider location/radiation/quality/duration/timing/severity/associated sxs/prior treatment) HPI Comments: 51 year old male presents emergency department to have an umbilical hernia checked. He has had this hernia for years and has never really caused him problems. One week ago he states the area became tender for "just a little while". Pain at that time is rated 5/10. Currently pain 0/10. He was able to person hernia back in. For the past week he has not had any problems since. He is in the process of looking for a new job and was advised to have the hernia checked out in case he needed surgery prior to starting a job. Moving his bowels normally. Denies abdominal pain, nausea or vomiting.  The history is provided by the patient.    No past medical history on file.  Past Surgical History  Procedure Date  . Femur surgery     left    No family history on file.  History  Substance Use Topics  . Smoking status: Current Every Day Smoker  . Smokeless tobacco: Not on file  . Alcohol Use: Yes      Review of Systems  Constitutional: Negative for fever, chills and appetite change.  Gastrointestinal: Negative for nausea, vomiting, abdominal pain and constipation.       Positive for hernia  Genitourinary: Negative.   Musculoskeletal: Negative for back pain.    Allergies  Review of patient's allergies indicates no known allergies.  Home Medications   Current Outpatient Rx  Name  Route  Sig  Dispense  Refill  . ACYCLOVIR PO   Oral   Take 1 capsule by mouth 2 (two) times daily as needed. When outbreak occurs. Patient does not know strength.         Marland Kitchen DOXAZOSIN MESYLATE 2 MG PO TABS   Oral   Take 2 mg by mouth daily.         Marland Kitchen VICODIN PO   Oral   Take 1 tablet by mouth daily as needed. For  pain         . NAPROXEN 375 MG PO TABS   Oral   Take 1 tablet (375 mg total) by mouth 2 (two) times daily.   20 tablet   0   . SULFAMETHOXAZOLE-TMP DS 800-160 MG PO TABS   Oral   Take 1 tablet by mouth 2 (two) times daily. Took for 10 days ending 06/03/11           BP 156/94  Temp 98.2 F (36.8 C)  Resp 22  SpO2 96%  Physical Exam  Constitutional: He is oriented to person, place, and time. He appears well-developed and well-nourished. No distress.  HENT:  Head: Normocephalic and atraumatic.  Eyes: Conjunctivae normal are normal.  Neck: Normal range of motion.  Cardiovascular: Normal rate, regular rhythm and normal heart sounds.   Pulmonary/Chest: Effort normal and breath sounds normal.  Abdominal: Soft. Bowel sounds are normal. There is no tenderness.    Musculoskeletal: Normal range of motion. He exhibits no edema.  Neurological: He is alert and oriented to person, place, and time.  Skin: Skin is warm and dry.  Psychiatric: He has a normal mood and affect. His behavior is normal.    ED Course  Procedures (including critical care time)  Labs Reviewed -  No data to display No results found.   1. Umbilical hernia       MDM  51 year old male with known umbilical hernia. Hernia is reducible and nontender. Patient is in no acute distress. Advised him to followup with outpatient surgery for evaluation. No concern for hernia strangulation or incarceration.       Trevor Mace, PA-C 01/17/12 1131

## 2012-01-17 NOTE — ED Notes (Signed)
Here to have naval hernia checked out. The hernia has been protruding for several weeks now. Ate a lot of pork/beans last week.

## 2012-01-17 NOTE — ED Notes (Signed)
NAD noted at time of d/c home 

## 2012-01-17 NOTE — ED Provider Notes (Signed)
Medical screening examination/treatment/procedure(s) were performed by non-physician practitioner and as supervising physician I was immediately available for consultation/collaboration.   Joya Gaskins, MD 01/17/12 [phone]

## 2012-02-05 ENCOUNTER — Ambulatory Visit (INDEPENDENT_AMBULATORY_CARE_PROVIDER_SITE_OTHER): Payer: Self-pay | Admitting: Surgery

## 2012-02-28 ENCOUNTER — Encounter (INDEPENDENT_AMBULATORY_CARE_PROVIDER_SITE_OTHER): Payer: Self-pay | Admitting: Surgery

## 2012-03-20 ENCOUNTER — Emergency Department (INDEPENDENT_AMBULATORY_CARE_PROVIDER_SITE_OTHER)
Admission: EM | Admit: 2012-03-20 | Discharge: 2012-03-20 | Disposition: A | Discharge status: Home / Self Care | Payer: No Typology Code available for payment source | Source: Home / Self Care | Attending: Family Medicine | Admitting: Family Medicine

## 2012-03-20 ENCOUNTER — Encounter (HOSPITAL_COMMUNITY): Payer: Self-pay

## 2012-03-20 DIAGNOSIS — K429 Umbilical hernia without obstruction or gangrene: Secondary | ICD-10-CM

## 2012-03-20 DIAGNOSIS — J449 Chronic obstructive pulmonary disease, unspecified: Secondary | ICD-10-CM

## 2012-03-20 DIAGNOSIS — G4730 Sleep apnea, unspecified: Secondary | ICD-10-CM

## 2012-03-20 DIAGNOSIS — F17200 Nicotine dependence, unspecified, uncomplicated: Secondary | ICD-10-CM

## 2012-03-20 DIAGNOSIS — J069 Acute upper respiratory infection, unspecified: Secondary | ICD-10-CM

## 2012-03-20 LAB — COMPREHENSIVE METABOLIC PANEL
ALT: 22 U/L (ref 0–53)
Alkaline Phosphatase: 87 U/L (ref 39–117)
BUN: 11 mg/dL (ref 6–23)
Chloride: 100 mEq/L (ref 96–112)
GFR calc Af Amer: >90 mL/min (ref >90)
Glucose, Bld: 135 mg/dL — ABNORMAL HIGH (ref 70–99)
Potassium: 3.8 mEq/L (ref 3.5–5.1)
Sodium: 137 mEq/L (ref 135–145)
Total Bilirubin: 0.3 mg/dL (ref 0.3–1.2)
Total Protein: 8.1 g/dL (ref 6.0–8.3)

## 2012-03-20 LAB — LIPID PANEL
HDL: 45 mg/dL (ref >39)
LDL Cholesterol: UNDETERMINED mg/dL (ref 0–99)
Triglycerides: 422 mg/dL — ABNORMAL HIGH (ref <150)
VLDL: UNDETERMINED mg/dL (ref 0–40)

## 2012-03-20 LAB — CBC
HCT: 52.3 % — ABNORMAL HIGH (ref 39.0–52.0)
Hemoglobin: 18.8 g/dL — ABNORMAL HIGH (ref 13.0–17.0)
MCHC: 35.9 g/dL (ref 30.0–36.0)
RBC: 5.78 MIL/uL (ref 4.22–5.81)
WBC: 10 10*3/uL (ref 4.0–10.5)

## 2012-03-20 LAB — TSH: TSH: 1.969 u[IU]/mL (ref 0.350–4.500)

## 2012-03-20 MED ORDER — ALBUTEROL SULFATE HFA 108 (90 BASE) MCG/ACT IN AERS: 2.0000 | INHALATION_SPRAY | Freq: Four times a day (QID) | RESPIRATORY_TRACT | Status: DC | PRN

## 2012-03-20 MED ORDER — FLUTICASONE PROPIONATE 50 MCG/ACT NA SUSP: 2.0000 | Freq: Every day | NASAL | Status: DC

## 2012-03-20 MED ORDER — AMOXICILLIN 500 MG PO CAPS: 500.0000 mg | ORAL_CAPSULE | Freq: Three times a day (TID) | ORAL | Status: DC

## 2012-03-20 MED ORDER — PREDNISONE 50 MG PO TABS: 50.0000 mg | ORAL_TABLET | Freq: Every day | ORAL | Status: DC

## 2012-03-20 MED ORDER — IPRATROPIUM BROMIDE 0.02 % IN SOLN
0.5000 mg | Freq: Once | RESPIRATORY_TRACT | Status: AC
  Administered 2012-03-20: 0.5 mg via RESPIRATORY_TRACT

## 2012-03-20 MED ORDER — ALBUTEROL SULFATE (5 MG/ML) 0.5% IN NEBU
INHALATION_SOLUTION | RESPIRATORY_TRACT | Status: AC
  Filled 2012-03-20: qty 0.5

## 2012-03-20 MED ORDER — ALBUTEROL SULFATE (5 MG/ML) 0.5% IN NEBU
2.5000 mg | INHALATION_SOLUTION | Freq: Once | RESPIRATORY_TRACT | Status: AC
  Administered 2012-03-20: 2.5 mg via RESPIRATORY_TRACT

## 2012-03-20 NOTE — ED Provider Notes (Signed)
History     CSN: [id number]  Arrival date & time 03/20/12  1714   First MD Initiated Contact with Patient 03/20/12 1756      Chief Complaint  Patient presents with  . Hernia   HPI Pt reports that he is having an umbilical hernia.  It is starting to protrude more.   Pt says that it is not causing any pain.  Pt says that he is having a lot of weight gain.  Pt says that he has never had his thyroid checked.  Pt says that he is having a cold.  He says that he is a smoker.  He is having some tenderness in hernia and wants to have it repaired before he starts working a new job.  He says that that he snores and wakes up every hour at night.  He never got to have a sleep study done.  He says that he is going to try to get insurance when he gets a new job.    He is coughing a lot in the past several days.     History reviewed. No pertinent past medical history.  Past Surgical History  Procedure Date  . Femur surgery     left    No family history on file.  History  Substance Use Topics  . Smoking status: Current Every Day Smoker  . Smokeless tobacco: Not on file  . Alcohol Use: Yes    Review of Systems  Constitutional: Positive for fatigue.       Not sleeping well  HENT: Positive for congestion, rhinorrhea, sneezing and postnasal drip.   Respiratory: Positive for cough, chest tightness, shortness of breath and wheezing.   Gastrointestinal:       Hernia - umbilicus   Genitourinary: Positive for enuresis.  Musculoskeletal: Positive for arthralgias.  All other systems reviewed and are negative.    Allergies  Review of patient's allergies indicates no known allergies.  Home Medications   Current Outpatient Rx  Name  Route  Sig  Dispense  Refill  . ACYCLOVIR 400 MG PO TABS   Oral   Take 400 mg by mouth 2 (two) times daily.         . ADULT MULTIVITAMIN W/MINERALS CH   Oral   Take 1 tablet by mouth daily.           BP 131/96  Pulse 101  Temp 97.7 F (36.5 C)  (Oral)  Resp 16  SpO2 99%  Physical Exam  Nursing note and vitals reviewed. Constitutional: He is oriented to person, place, and time. He appears well-developed and well-nourished. No distress.  HENT:  Head: Normocephalic and atraumatic.  Nose: Mucosal edema and rhinorrhea present. Right sinus exhibits maxillary sinus tenderness. Left sinus exhibits maxillary sinus tenderness.  Mouth/Throat: Dental caries present.  Eyes: EOM are normal. Pupils are equal, round, and reactive to light.  Neck: Normal range of motion. Neck supple. No JVD present. No tracheal deviation present. No thyromegaly present.  Cardiovascular: Regular rhythm and normal heart sounds.   Abdominal: Soft. Bowel sounds are normal. There is no hepatosplenomegaly. A hernia is present. Hernia confirmed positive in the ventral area.    Musculoskeletal: Normal range of motion.  Lymphadenopathy:    He has no cervical adenopathy.  Neurological: He is alert and oriented to person, place, and time.  Skin: Skin is warm and dry.  Psychiatric: He has a normal mood and affect. His behavior is normal. Judgment and thought content normal.  ED Course  Procedures (including critical care time)  Labs Reviewed - No data to display No results found.   No diagnosis found.  MDM  IMPRESSION  Suspected OSA  Elevated BP  Umbilical Hernia  Sinusitis  Acute Bronchitis  Cough  Smoker  RECOMMENDATIONS / PLAN Strongly encouraged tobacco cessation.  In addition, strongly advised patient to get a sleep study done.  He was to pursue seeing a general surgeon so that he can have his umbilical hernia treated.  He reports that he does not believe he is going to be able to get a good job if he has that umbilical hernia because he won't be able to do any significant lifting.  In addition, to treat the sinusitis and bronchitis he was given a nebulizer treatment in the office with DuoNeb and responded very well to that treatment.  Given  prescription for amoxicillin 500 mg 3 times a day, albuterol inhaler, I prescribed a short course burst of prednisone.  I plan to refer him to a general surgeon to evaluate the umbilical hernia.  I counseled the patient on precautions regarding avoiding lifting and strangulation of the hernia.  The patient verbalized understanding.  Encouraged alcohol cessation.   FOLLOW UP 2 months  The patient was given clear instructions to go to ER or return to medical center if symptoms don't improve, worsen or new problems develop.  The patient verbalized understanding.  The patient was told to call to get lab results if they haven't heard anything in the next week.            Cleora Fleet, MD 03/20/12 1945

## 2012-03-20 NOTE — ED Notes (Signed)
Complain on umbilical is protruding out  For about a year but now getting bigger

## 2012-03-21 NOTE — Progress Notes (Signed)
Quick Note:  Please notify patient that her triglycerides came back very high. We need to check fasting lipid panel in 1 month. Also pt's hemoglobin level was elevated. Recommend checking CBC in 1 month. Other labs came back OK. Please have pt return to clinic for fasting lab appointment in 1 month for CBC and Lipid Panel.   Rodney Langton, MD, CDE, FAAFP [facility] [place], [place]   ______

## 2012-03-23 NOTE — Progress Notes (Signed)
Patient stopped by office-lab results given

## 2012-03-27 MED ORDER — AMOXICILLIN 500 MG PO CAPS: 500.0000 mg | ORAL_CAPSULE | Freq: Three times a day (TID) | ORAL | Status: DC

## 2012-03-27 MED ORDER — PREDNISONE 50 MG PO TABS: 50.0000 mg | ORAL_TABLET | Freq: Every day | ORAL | Status: DC

## 2012-03-27 MED ORDER — FLUTICASONE PROPIONATE 50 MCG/ACT NA SUSP: 2.0000 | Freq: Every day | NASAL | Status: DC

## 2012-03-27 MED ORDER — ALBUTEROL SULFATE HFA 108 (90 BASE) MCG/ACT IN AERS: 2.0000 | INHALATION_SPRAY | Freq: Four times a day (QID) | RESPIRATORY_TRACT | Status: DC | PRN

## 2012-03-27 NOTE — ED Notes (Signed)
Referred to general surgery for umbilical hernia repair- waiting on an appt

## 2012-04-02 ENCOUNTER — Ambulatory Visit (INDEPENDENT_AMBULATORY_CARE_PROVIDER_SITE_OTHER): Payer: PRIVATE HEALTH INSURANCE | Admitting: General Surgery

## 2012-04-02 ENCOUNTER — Encounter (INDEPENDENT_AMBULATORY_CARE_PROVIDER_SITE_OTHER): Payer: Self-pay | Admitting: General Surgery

## 2012-04-02 VITALS — BP 148/86 | HR 72 | Temp 97.4°F | Resp 18 | Ht 67.5 in | Wt 256.5 lb

## 2012-04-02 DIAGNOSIS — K429 Umbilical hernia without obstruction or gangrene: Secondary | ICD-10-CM

## 2012-04-02 NOTE — Progress Notes (Signed)
Patient ID: Samuel Irwin, male   DOB: 01/15/1961, 52 y.o.   MRN: [id number]  Chief Complaint  Patient presents with  . Umbilical Hernia    HPI Samuel Irwin is a 52 y.o. male.  This patient presents for evaluation of an umbilical hernia. He says that he first noticed this about 2 years ago and initially and he noticed it, he was having some discomfort in the area of but has increased in size and over the last 2 years and says it has increased in size it has become less uncomfortable. He says his last episode of discomfort was about 4 months ago where he says it felt like "a push button".  He denies any abdominal distension or obstructive symptoms.  His bowels are normal. HPI  Past Medical History  Diagnosis Date  . GERD (gastroesophageal reflux disease)   . Umbilical hernia   . Constipation   . Cough   . Wheezing   . Weakness   . Generalized headaches     Past Surgical History  Procedure Date  . Femur surgery     left    Family History  Problem Relation Age of Onset  . Cancer Father     lung    Social History History  Substance Use Topics  . Smoking status: Current Every Day Smoker -- 0.5 packs/day  . Smokeless tobacco: Not on file  . Alcohol Use: Yes    No Known Allergies  Current Outpatient Prescriptions  Medication Sig Dispense Refill  . acyclovir (ZOVIRAX) 400 MG tablet Take 400 mg by mouth 2 (two) times daily.      . albuterol (PROVENTIL HFA;VENTOLIN HFA) 108 (90 BASE) MCG/ACT inhaler Inhale 2 puffs into the lungs every 6 (six) hours as needed for wheezing or shortness of breath.  1 Inhaler  2  . amoxicillin (AMOXIL) 500 MG capsule Take 1 capsule (500 mg total) by mouth 3 (three) times daily.  30 capsule  0  . fluticasone (FLONASE) 50 MCG/ACT nasal spray Place 2 sprays into the nose daily.  16 g  2  . Multiple Vitamin (MULTIVITAMIN WITH MINERALS) TABS Take 1 tablet by mouth daily.      . predniSONE (DELTASONE) 50 MG tablet Take 1 tablet (50 mg total) by  mouth daily.  7 tablet  0    Review of Systems Review of Systems All other review of systems negative or noncontributory except as stated in the HPI  Blood pressure 148/86, pulse 72, temperature 97.4 F (36.3 C), temperature source Temporal, resp. rate 18, height 5' 7.5" (1.715 m), weight 256 lb 8 oz (116.348 kg).  Physical Exam Physical Exam Physical Exam  Vitals reviewed. Constitutional: He is oriented to person, place, and time. He appears well-developed and well-nourished. No distress.  HENT:  Head: Normocephalic and atraumatic.  Mouth/Throat: No oropharyngeal exudate.  Eyes: Conjunctivae and EOM are normal. Pupils are equal, round, and reactive to light. Right eye exhibits no discharge. Left eye exhibits no discharge. No scleral icterus.  Neck: Normal range of motion. No tracheal deviation present.  Cardiovascular: Normal rate, regular rhythm and normal heart sounds.   Pulmonary/Chest: Effort normal and breath sounds normal. No stridor. No respiratory distress. He has no wheezes. He has no rales. He exhibits no tenderness.  Abdominal: Soft. Bowel sounds are normal. He exhibits no distension and no mass. There is no tenderness. There is no rebound and no guarding. He has a moderate sized abdominal hernia which is mildly tender and is   easily reducible. Musculoskeletal: Normal range of motion. He exhibits no edema and no tenderness.  Neurological: He is alert and oriented to person, place, and time.  Skin: Skin is warm and dry. No rash noted. He is not diaphoretic. No erythema. No pallor.  Psychiatric: He has a normal mood and affect. His behavior is normal. Judgment and thought content normal.    Data Reviewed   Assessment    Umbilical hernia-reducible He does have an easily reducible umbilical hernia. We discussed the options for repair including laparoscopic and open repair and I think that this  Is small enough for open repair. We discussed the pros and cons of each of them  the risks and benefits of the procedure. We discussed the risks of infection, bleeding, pain, scarring, recurrence, bowel injury, poor cosmesis and death. He expressed understanding and would like to proceed with umbilical hernia repair with possible mesh soon as available    Plan    We will go ahead and set him up for open umbilical hernia repair with mesh as soon as available.       Henritta Mutz DAVID 04/02/2012, 4:33 PM    

## 2012-04-03 NOTE — ED Notes (Signed)
Patient has appt [facility] 04/02/12 @ 4 pm-patient was seen

## 2012-04-06 ENCOUNTER — Encounter (HOSPITAL_COMMUNITY): Payer: Self-pay

## 2012-04-06 ENCOUNTER — Encounter (HOSPITAL_COMMUNITY): Payer: Self-pay | Admitting: Pharmacy Technician

## 2012-04-06 ENCOUNTER — Encounter (HOSPITAL_COMMUNITY)
Admission: RE | Admit: 2012-04-06 | Discharge: 2012-04-06 | Disposition: A | Discharge status: Home / Self Care | Payer: No Typology Code available for payment source | Source: Ambulatory Visit | Attending: General Surgery | Admitting: General Surgery

## 2012-04-06 ENCOUNTER — Encounter (HOSPITAL_COMMUNITY)
Admission: RE | Admit: 2012-04-06 | Discharge: 2012-04-06 | Disposition: A | Discharge status: Home / Self Care | Payer: No Typology Code available for payment source | Source: Ambulatory Visit | Attending: Anesthesiology | Admitting: Anesthesiology

## 2012-04-06 VITALS — BP 153/89 | HR 87 | Temp 98.7°F | Resp 20 | Ht 68.0 in | Wt 256.9 lb

## 2012-04-06 DIAGNOSIS — K429 Umbilical hernia without obstruction or gangrene: Secondary | ICD-10-CM

## 2012-04-06 HISTORY — DX: Cardiac murmur, unspecified: R01.1

## 2012-04-06 HISTORY — DX: Personal history of other diseases of the digestive system: Z87.19

## 2012-04-06 LAB — CBC
Platelets: 294 10*3/uL (ref 150–400)
RDW: 15.2 % (ref 11.5–15.5)
WBC: 8.7 10*3/uL (ref 4.0–10.5)

## 2012-04-06 LAB — BASIC METABOLIC PANEL
Chloride: 106 mEq/L (ref 96–112)
GFR calc Af Amer: 75 mL/min — ABNORMAL LOW (ref >90)
GFR calc non Af Amer: 64 mL/min — ABNORMAL LOW (ref >90)
Potassium: 4.1 mEq/L (ref 3.5–5.1)
Sodium: 142 mEq/L (ref 135–145)

## 2012-04-06 LAB — SURGICAL PCR SCREEN
MRSA, PCR: NEGATIVE
Staphylococcus aureus: NEGATIVE

## 2012-04-06 MED ORDER — CEFAZOLIN SODIUM-DEXTROSE 2-3 GM-% IV SOLR
2.0000 g | INTRAVENOUS | Status: AC
  Administered 2012-04-07: 2 g via INTRAVENOUS

## 2012-04-06 NOTE — Pre-Procedure Instructions (Addendum)
SEARS ORAN  04/06/2012   Your procedure is scheduled on:  04/07/12  Report to [facility] at 1245 AM.  Call this number if you have problems the morning of surgery: [phone]   Remember:   Do not eat food or drink liquids after midnight.   Take these medicines the morning of surgery with A SIP OF WATER: inhaler, prednisone, amoxicillin if needed  Do not wear jewelry, make-up or nail polish.  Do not wear lotions, powders, or perfumes. You may wear deodorant.  Do not shave 48 hours prior to surgery. Men may shave face and neck.  Do not bring valuables to the hospital.  Contacts, dentures or bridgework may not be worn into surgery.  Leave suitcase in the car. After surgery it may be brought to your room.  For patients admitted to the hospital, checkout time is 11:00 AM the day of  discharge.   Patients discharged the day of surgery will not be allowed to drive  home.  Name and phone number of your driver:  Special Instructions: Shower using CHG 2 nights before surgery and the night before surgery.  If you shower the day of surgery use CHG.  Use special wash - you have one bottle of CHG for all showers.  You should use approximately 1/3 of the bottle for each shower.   Please read over the following fact sheets that you were given: Pain Booklet, Coughing and Deep Breathing, MRSA Information and Surgical Site Infection Prevention

## 2012-04-07 ENCOUNTER — Ambulatory Visit (HOSPITAL_COMMUNITY): Payer: No Typology Code available for payment source | Admitting: Anesthesiology

## 2012-04-07 ENCOUNTER — Encounter (HOSPITAL_COMMUNITY)
Admission: RE | Disposition: A | Discharge status: Home / Self Care | Payer: Self-pay | Source: Ambulatory Visit | Attending: General Surgery

## 2012-04-07 ENCOUNTER — Ambulatory Visit (HOSPITAL_COMMUNITY)
Admission: RE | Admit: 2012-04-07 | Discharge: 2012-04-07 | Disposition: A | Discharge status: Home / Self Care | Payer: No Typology Code available for payment source | Source: Ambulatory Visit | Attending: General Surgery | Admitting: General Surgery

## 2012-04-07 ENCOUNTER — Ambulatory Visit (HOSPITAL_COMMUNITY): Payer: No Typology Code available for payment source

## 2012-04-07 ENCOUNTER — Encounter (HOSPITAL_COMMUNITY): Payer: Self-pay

## 2012-04-07 ENCOUNTER — Encounter (HOSPITAL_COMMUNITY): Payer: Self-pay | Admitting: Anesthesiology

## 2012-04-07 ENCOUNTER — Other Ambulatory Visit: Payer: Self-pay

## 2012-04-07 DIAGNOSIS — Z01818 Encounter for other preprocedural examination: Secondary | ICD-10-CM | POA: Insufficient documentation

## 2012-04-07 DIAGNOSIS — K420 Umbilical hernia with obstruction, without gangrene: Secondary | ICD-10-CM | POA: Insufficient documentation

## 2012-04-07 DIAGNOSIS — R079 Chest pain, unspecified: Secondary | ICD-10-CM | POA: Insufficient documentation

## 2012-04-07 DIAGNOSIS — Z01812 Encounter for preprocedural laboratory examination: Secondary | ICD-10-CM | POA: Insufficient documentation

## 2012-04-07 DIAGNOSIS — K429 Umbilical hernia without obstruction or gangrene: Secondary | ICD-10-CM

## 2012-04-07 DIAGNOSIS — R0602 Shortness of breath: Secondary | ICD-10-CM | POA: Insufficient documentation

## 2012-04-07 HISTORY — PX: INSERTION OF MESH: SHX5868

## 2012-04-07 HISTORY — PX: UMBILICAL HERNIA REPAIR: SHX196

## 2012-04-07 HISTORY — PX: HERNIA REPAIR: SHX51

## 2012-04-07 SURGERY — REPAIR, HERNIA, UMBILICAL, ADULT
Anesthesia: General | Site: Abdomen | Wound class: Clean

## 2012-04-07 MED ORDER — KETOROLAC TROMETHAMINE 30 MG/ML IJ SOLN
15.0000 mg | Freq: Once | INTRAMUSCULAR | Status: AC | PRN
  Administered 2012-04-07: 30 mg via INTRAVENOUS

## 2012-04-07 MED ORDER — OXYCODONE HCL 5 MG PO TABS
ORAL_TABLET | ORAL | Status: AC
  Administered 2012-04-07: 5 mg
  Filled 2012-04-07: qty 1

## 2012-04-07 MED ORDER — ACETAMINOPHEN 10 MG/ML IV SOLN
INTRAVENOUS | Status: AC
  Filled 2012-04-07: qty 100

## 2012-04-07 MED ORDER — BUPIVACAINE HCL (PF) 0.25 % IJ SOLN
INTRAMUSCULAR | Status: AC
  Filled 2012-04-07: qty 10

## 2012-04-07 MED ORDER — FENTANYL CITRATE 0.05 MG/ML IJ SOLN: 25.0000 ug | INTRAMUSCULAR | Status: DC | PRN

## 2012-04-07 MED ORDER — MIDAZOLAM HCL 5 MG/5ML IJ SOLN
INTRAMUSCULAR | Status: DC | PRN
  Administered 2012-04-07: 2 mg via INTRAVENOUS

## 2012-04-07 MED ORDER — OXYCODONE HCL 5 MG PO TABS: 5.0000 mg | ORAL_TABLET | ORAL | Status: DC | PRN

## 2012-04-07 MED ORDER — ONDANSETRON HCL 4 MG/2ML IJ SOLN
INTRAMUSCULAR | Status: DC | PRN
  Administered 2012-04-07: 4 mg via INTRAVENOUS

## 2012-04-07 MED ORDER — CEFAZOLIN SODIUM-DEXTROSE 2-3 GM-% IV SOLR
INTRAVENOUS | Status: AC
  Filled 2012-04-07: qty 50

## 2012-04-07 MED ORDER — FENTANYL CITRATE 0.05 MG/ML IJ SOLN
INTRAMUSCULAR | Status: DC | PRN
  Administered 2012-04-07: 100 ug via INTRAVENOUS
  Administered 2012-04-07: 50 ug via INTRAVENOUS

## 2012-04-07 MED ORDER — MEPERIDINE HCL 25 MG/ML IJ SOLN: 6.2500 mg | INTRAMUSCULAR | Status: DC | PRN

## 2012-04-07 MED ORDER — DEXAMETHASONE SODIUM PHOSPHATE 4 MG/ML IJ SOLN
INTRAMUSCULAR | Status: DC | PRN
  Administered 2012-04-07: 4 mg via INTRAVENOUS

## 2012-04-07 MED ORDER — BUPIVACAINE HCL (PF) 0.25 % IJ SOLN
INTRAMUSCULAR | Status: AC
  Filled 2012-04-07: qty 30

## 2012-04-07 MED ORDER — PROPOFOL 10 MG/ML IV BOLUS
INTRAVENOUS | Status: DC | PRN
  Administered 2012-04-07 (×2): 50 mg via INTRAVENOUS
  Administered 2012-04-07: 300 mg via INTRAVENOUS

## 2012-04-07 MED ORDER — KETOROLAC TROMETHAMINE 30 MG/ML IJ SOLN
INTRAMUSCULAR | Status: AC
  Filled 2012-04-07: qty 1

## 2012-04-07 MED ORDER — PROMETHAZINE HCL 25 MG/ML IJ SOLN: 6.2500 mg | INTRAMUSCULAR | Status: DC | PRN

## 2012-04-07 MED ORDER — ACETAMINOPHEN 10 MG/ML IV SOLN
1000.0000 mg | Freq: Four times a day (QID) | INTRAVENOUS | Status: DC
  Administered 2012-04-07: 1000 mg via INTRAVENOUS

## 2012-04-07 MED ORDER — LIDOCAINE-EPINEPHRINE (PF) 1 %-1:200000 IJ SOLN
INTRAMUSCULAR | Status: AC
  Filled 2012-04-07: qty 10

## 2012-04-07 MED ORDER — LACTATED RINGERS IV SOLN
INTRAVENOUS | Status: DC
  Administered 2012-04-07 (×3): via INTRAVENOUS

## 2012-04-07 MED ORDER — PHENYLEPHRINE HCL 10 MG/ML IJ SOLN
INTRAMUSCULAR | Status: DC | PRN
  Administered 2012-04-07 (×6): 80 ug via INTRAVENOUS
  Administered 2012-04-07: 100 ug via INTRAVENOUS

## 2012-04-07 MED ORDER — NEOSTIGMINE METHYLSULFATE 1 MG/ML IJ SOLN
INTRAMUSCULAR | Status: DC | PRN
  Administered 2012-04-07: 3 mg via INTRAVENOUS

## 2012-04-07 MED ORDER — BUPIVACAINE-EPINEPHRINE PF 0.25-1:200000 % IJ SOLN
INTRAMUSCULAR | Status: AC
  Filled 2012-04-07: qty 30

## 2012-04-07 MED ORDER — METOPROLOL TARTRATE 1 MG/ML IV SOLN
INTRAVENOUS | Status: DC | PRN
  Administered 2012-04-07: 2 mg via INTRAVENOUS
  Administered 2012-04-07: 3 mg via INTRAVENOUS

## 2012-04-07 MED ORDER — GLYCOPYRROLATE 0.2 MG/ML IJ SOLN
INTRAMUSCULAR | Status: DC | PRN
  Administered 2012-04-07: 0.2 mg via INTRAVENOUS
  Administered 2012-04-07: 0.4 mg via INTRAVENOUS

## 2012-04-07 MED ORDER — SUCCINYLCHOLINE CHLORIDE 20 MG/ML IJ SOLN
INTRAMUSCULAR | Status: DC | PRN
  Administered 2012-04-07: 100 mg via INTRAVENOUS

## 2012-04-07 MED ORDER — ROCURONIUM BROMIDE 100 MG/10ML IV SOLN
INTRAVENOUS | Status: DC | PRN
  Administered 2012-04-07: 30 mg via INTRAVENOUS

## 2012-04-07 MED ORDER — 0.9 % SODIUM CHLORIDE (POUR BTL) OPTIME
TOPICAL | Status: DC | PRN
  Administered 2012-04-07: 1000 mL

## 2012-04-07 MED ORDER — LIDOCAINE HCL 1 % IJ SOLN
INTRAMUSCULAR | Status: DC | PRN
  Administered 2012-04-07: 60 mg via INTRADERMAL

## 2012-04-07 MED ORDER — MIDAZOLAM HCL 2 MG/2ML IJ SOLN: 0.5000 mg | Freq: Once | INTRAMUSCULAR | Status: AC | PRN

## 2012-04-07 MED ORDER — LIDOCAINE-EPINEPHRINE (PF) 1 %-1:200000 IJ SOLN
INTRAMUSCULAR | Status: DC | PRN
  Administered 2012-04-07: 15:00:00

## 2012-04-07 SURGICAL SUPPLY — 50 items
ADH SKN CLS APL DERMABOND .7 (GAUZE/BANDAGES/DRESSINGS) ×1
APL SKNCLS STERI-STRIP NONHPOA (GAUZE/BANDAGES/DRESSINGS)
BALL CTTN LRG ABS STRL LF (GAUZE/BANDAGES/DRESSINGS) ×1
BENZOIN TINCTURE PRP APPL 2/3 (GAUZE/BANDAGES/DRESSINGS) IMPLANT
BLADE SURG 10 STRL SS (BLADE) ×2 IMPLANT
BLADE SURG 15 STRL LF DISP TIS (BLADE) ×1 IMPLANT
BLADE SURG 15 STRL SS (BLADE) ×2
BLADE SURG ROTATE 9660 (MISCELLANEOUS) IMPLANT
CHLORAPREP W/TINT 26ML (MISCELLANEOUS) ×2 IMPLANT
CLOTH BEACON ORANGE TIMEOUT ST (SAFETY) ×2 IMPLANT
COTTONBALL LRG STERILE PKG (GAUZE/BANDAGES/DRESSINGS) ×2 IMPLANT
COVER SURGICAL LIGHT HANDLE (MISCELLANEOUS) ×2 IMPLANT
DECANTER SPIKE VIAL GLASS SM (MISCELLANEOUS) ×4 IMPLANT
DERMABOND ADVANCED (GAUZE/BANDAGES/DRESSINGS) ×1
DERMABOND ADVANCED .7 DNX12 (GAUZE/BANDAGES/DRESSINGS) IMPLANT
DRAPE PED LAPAROTOMY (DRAPES) ×2 IMPLANT
DRSG TEGADERM 4X4.75 (GAUZE/BANDAGES/DRESSINGS) ×2 IMPLANT
ELECT COATED BLADE 2.86 ST (ELECTRODE) ×2 IMPLANT
ELECT REM PT RETURN 9FT ADLT (ELECTROSURGICAL) ×2
ELECTRODE REM PT RTRN 9FT ADLT (ELECTROSURGICAL) ×1 IMPLANT
GLOVE BIOGEL PI IND STRL 6 (GLOVE) IMPLANT
GLOVE BIOGEL PI IND STRL 7.0 (GLOVE) IMPLANT
GLOVE BIOGEL PI INDICATOR 6 (GLOVE) ×1
GLOVE BIOGEL PI INDICATOR 7.0 (GLOVE) ×2
GLOVE SURG SS PI 7.5 STRL IVOR (GLOVE) ×4 IMPLANT
GOWN BRE IMP SLV AUR XL STRL (GOWN DISPOSABLE) ×1 IMPLANT
GOWN STRL NON-REIN LRG LVL3 (GOWN DISPOSABLE) ×2 IMPLANT
GOWN STRL REIN XL XLG (GOWN DISPOSABLE) ×2 IMPLANT
KIT BASIN OR (CUSTOM PROCEDURE TRAY) ×2 IMPLANT
KIT ROOM TURNOVER OR (KITS) ×2 IMPLANT
NDL HYPO 25GX1X1/2 BEV (NEEDLE) ×1 IMPLANT
NEEDLE HYPO 25GX1X1/2 BEV (NEEDLE) ×2 IMPLANT
NS IRRIG 1000ML POUR BTL (IV SOLUTION) ×2 IMPLANT
PACK SURGICAL SETUP 50X90 (CUSTOM PROCEDURE TRAY) ×2 IMPLANT
PAD ARMBOARD 7.5X6 YLW CONV (MISCELLANEOUS) ×2 IMPLANT
PATCH VENTRAL SMALL 4.3 (Mesh Specialty) ×1 IMPLANT
PENCIL BUTTON HOLSTER BLD 10FT (ELECTRODE) ×2 IMPLANT
SPONGE LAP 18X18 X RAY DECT (DISPOSABLE) ×2 IMPLANT
STRIP CLOSURE SKIN 1/2X4 (GAUZE/BANDAGES/DRESSINGS) IMPLANT
SUT ETHIBOND 0 MO6 C/R (SUTURE) ×2 IMPLANT
SUT MNCRL AB 4-0 PS2 18 (SUTURE) ×2 IMPLANT
SUT PROLENE 2 0 SH DA (SUTURE) ×8 IMPLANT
SUT VIC AB 3-0 SH 27 (SUTURE) ×4
SUT VIC AB 3-0 SH 27X BRD (SUTURE) ×1 IMPLANT
SYR BULB 3OZ (MISCELLANEOUS) ×2 IMPLANT
SYR CONTROL 10ML LL (SYRINGE) ×2 IMPLANT
TOWEL OR 17X24 6PK STRL BLUE (TOWEL DISPOSABLE) ×2 IMPLANT
TOWEL OR 17X26 10 PK STRL BLUE (TOWEL DISPOSABLE) ×2 IMPLANT
TUBE CONNECTING 12X1/4 (SUCTIONS) ×1 IMPLANT
YANKAUER SUCT BULB TIP NO VENT (SUCTIONS) IMPLANT

## 2012-04-07 NOTE — Progress Notes (Signed)
Pt stated on arrival that he was has been having some chest pain since yesterday, after having chest ray and eating hamburger and rice and gravy with a friend. Described pain as radiating through to back. And having some SOB last night and wondered about sleep apnea. Also said he had pain in teeth and jaw last night but that it's gone today. States he coughs up some brown phlem sometimes, with the last time being today. EKG done and Dr. Brayton Caves called to report patient's description of episode, symptoms, and that ekg was done. EKG from today taken to holding for Dr. Sheral Apley to review and compare with EKG from October 2012. Order obtained to repeat CXR as well.

## 2012-04-07 NOTE — Progress Notes (Signed)
After review of EKG's Dr. Brayton Caves stated okay to go ahead with surgery and that he would evaluate further in holding area as needed, per Pam Hicks,RN.

## 2012-04-07 NOTE — Progress Notes (Signed)
Pt. Returned fr. Radiololgy, states there is "something that I feel  doesn't feel like everything is just right" BP- 125/82- resting on his back, elevated HOB- 60 degrees.  Pt. Vague, states that he is waking up at night with mucous on himself.  Pt. Reports headache & also states that this odd feeling is more in his head, ?congestion. Pt. Reassured & told that the anesth. Dept. Will evaluate him in the holding area.

## 2012-04-07 NOTE — Preoperative (Signed)
Beta Blockers   Reason not to administer Beta Blockers:Not Applicable 

## 2012-04-07 NOTE — Progress Notes (Signed)
Patient's valuables locked up in security. Clothing,and other belongings taken with patient and will be taken to  PACU.

## 2012-04-07 NOTE — Anesthesia Preprocedure Evaluation (Addendum)
Anesthesia Evaluation  Patient identified by MRN, date of birth, ID band Patient awake    Reviewed: Allergy & Precautions, H&P , NPO status , Patient's Chart, lab work & pertinent test results, reviewed documented beta blocker date and time   History of Anesthesia Complications Negative for: history of anesthetic complications  Airway Mallampati: IV TM Distance: >3 FB Neck ROM: full  Mouth opening: Limited Mouth Opening  Dental no notable dental hx. (+) Teeth Intact, Dental Advisory Given and Chipped   Pulmonary neg pulmonary ROS, shortness of breath, pneumonia -, resolved, COPD breath sounds clear to auscultation  Pulmonary exam normal       Cardiovascular Exercise Tolerance: Good negative cardio ROS  + Valvular Problems/Murmurs Rhythm:regular Rate:Normal     Neuro/Psych  Headaches, negative neurological ROS  negative psych ROS   GI/Hepatic Neg liver ROS, hiatal hernia, GERD-  ,(+)     substance abuse  alcohol use,   Endo/Other  Morbid obesity  Renal/GU negative Renal ROS     Musculoskeletal   Abdominal   Peds  Hematology negative hematology ROS (+)   Anesthesia Other Findings GERD (gastroesophageal reflux disease)     Umbilical hernia        Constipation     Cough        Wheezing     Weakness        Generalized headaches     Heart murmur        Shortness of breath   occ exersion Pneumonia   hx    Sinusitis     H/O hiatal hernia    Reproductive/Obstetrics negative OB ROS                        Anesthesia Physical Anesthesia Plan  ASA: III  Anesthesia Plan: General ETT   Post-op Pain Management:    Induction:   Airway Management Planned: Video Laryngoscope Planned  Additional Equipment:   Intra-op Plan:   Post-operative Plan:   Informed Consent: I have reviewed the patients History and Physical, chart, labs and discussed the procedure including the risks, benefits and  alternatives for the proposed anesthesia with the patient or authorized representative who has indicated his/her understanding and acceptance.   Dental Advisory Given  Plan Discussed with: CRNA and Surgeon  Anesthesia Plan Comments:        Anesthesia Quick Evaluation

## 2012-04-07 NOTE — Transfer of Care (Signed)
Immediate Anesthesia Transfer of Care Note  Patient: Samuel Irwin  Procedure(s) Performed: Procedure(s) with comments: HERNIA REPAIR UMBILICAL ADULT (N/A) - open umbilical hernia repair with mesh INSERTION OF MESH (N/A)  Patient Location: PACU  Anesthesia Type:General  Level of Consciousness: awake, alert , oriented and patient cooperative  Airway & Oxygen Therapy: Patient Spontanous Breathing and Patient connected to face mask oxygen  Post-op Assessment: Report given to PACU RN and Post -op Vital signs reviewed and stable  Post vital signs: Reviewed and stable  Complications: No apparent anesthesia complications

## 2012-04-07 NOTE — Anesthesia Procedure Notes (Addendum)
Procedure Name: Intubation Date/Time: 04/07/2012 3:48 PM Performed by: Leona Singleton A Pre-anesthesia Checklist: Patient identified Patient Re-evaluated:Patient Re-evaluated prior to inductionOxygen Delivery Method: Circle system utilized Preoxygenation: Pre-oxygenation with 100% oxygen Intubation Type: IV induction Ventilation: Oral airway inserted - appropriate to patient size and Two handed mask ventilation required Grade View: Grade IV Tube type: Oral Tube size: 7.5 mm Number of attempts: 1 Airway Equipment and Method: Video-laryngoscopy and Stylet Placement Confirmation: ETT inserted through vocal cords under direct vision,  positive ETCO2,  CO2 detector and breath sounds checked- equal and bilateral Secured at: 23 cm Tube secured with: Tape Dental Injury: Teeth and Oropharynx as per pre-operative assessment and Bloody posterior oropharynx  Difficulty Due To: Difficulty was anticipated, Difficult Airway- due to large tongue, Difficult Airway- due to anterior larynx and Difficult Airway- due to limited oral opening Future Recommendations: Recommend- induction with short-acting agent, and alternative techniques readily available

## 2012-04-07 NOTE — Anesthesia Postprocedure Evaluation (Signed)
  Anesthesia Post-op Note  Patient: Samuel Irwin  Procedure(s) Performed: Procedure(s) with comments: HERNIA REPAIR UMBILICAL ADULT (N/A) - open umbilical hernia repair with mesh INSERTION OF MESH (N/A)  Patient Location: PACU  Anesthesia Type:General  Level of Consciousness: awake and alert   Airway and Oxygen Therapy: Patient Spontanous Breathing  Post-op Pain: mild  Post-op Assessment: Post-op Vital signs reviewed, Patient's Cardiovascular Status Stable and Respiratory Function Stable  Post-op Vital Signs: stable  Complications: No apparent anesthesia complications

## 2012-04-07 NOTE — H&P (View-Only) (Signed)
Patient ID: Samuel Irwin, male   DOB: 1960/09/05, 52 y.o.   MRN: [id number]  Chief Complaint  Patient presents with  . Umbilical Hernia    HPI Samuel Irwin is a 52 y.o. male.  This patient presents for evaluation of an umbilical hernia. He says that he first noticed this about 2 years ago and initially and he noticed it, he was having some discomfort in the area of but has increased in size and over the last 2 years and says it has increased in size it has become less uncomfortable. He says his last episode of discomfort was about 4 months ago where he says it felt like "a push button".  He denies any abdominal distension or obstructive symptoms.  His bowels are normal. HPI  Past Medical History  Diagnosis Date  . GERD (gastroesophageal reflux disease)   . Umbilical hernia   . Constipation   . Cough   . Wheezing   . Weakness   . Generalized headaches     Past Surgical History  Procedure Date  . Femur surgery     left    Family History  Problem Relation Age of Onset  . Cancer Father     lung    Social History History  Substance Use Topics  . Smoking status: Current Every Day Smoker -- 0.5 packs/day  . Smokeless tobacco: Not on file  . Alcohol Use: Yes    No Known Allergies  Current Outpatient Prescriptions  Medication Sig Dispense Refill  . acyclovir (ZOVIRAX) 400 MG tablet Take 400 mg by mouth 2 (two) times daily.      Marland Kitchen albuterol (PROVENTIL HFA;VENTOLIN HFA) 108 (90 BASE) MCG/ACT inhaler Inhale 2 puffs into the lungs every 6 (six) hours as needed for wheezing or shortness of breath.  1 Inhaler  2  . amoxicillin (AMOXIL) 500 MG capsule Take 1 capsule (500 mg total) by mouth 3 (three) times daily.  30 capsule  0  . fluticasone (FLONASE) 50 MCG/ACT nasal spray Place 2 sprays into the nose daily.  16 g  2  . Multiple Vitamin (MULTIVITAMIN WITH MINERALS) TABS Take 1 tablet by mouth daily.      . predniSONE (DELTASONE) 50 MG tablet Take 1 tablet (50 mg total) by  mouth daily.  7 tablet  0    Review of Systems Review of Systems All other review of systems negative or noncontributory except as stated in the HPI  Blood pressure 148/86, pulse 72, temperature 97.4 F (36.3 C), temperature source Temporal, resp. rate 18, height 5' 7.5" (1.715 m), weight 256 lb 8 oz (116.348 kg).  Physical Exam Physical Exam Physical Exam  Vitals reviewed. Constitutional: He is oriented to person, place, and time. He appears well-developed and well-nourished. No distress.  HENT:  Head: Normocephalic and atraumatic.  Mouth/Throat: No oropharyngeal exudate.  Eyes: Conjunctivae and EOM are normal. Pupils are equal, round, and reactive to light. Right eye exhibits no discharge. Left eye exhibits no discharge. No scleral icterus.  Neck: Normal range of motion. No tracheal deviation present.  Cardiovascular: Normal rate, regular rhythm and normal heart sounds.   Pulmonary/Chest: Effort normal and breath sounds normal. No stridor. No respiratory distress. He has no wheezes. He has no rales. He exhibits no tenderness.  Abdominal: Soft. Bowel sounds are normal. He exhibits no distension and no mass. There is no tenderness. There is no rebound and no guarding. He has a moderate sized abdominal hernia which is mildly tender and is  easily reducible. Musculoskeletal: Normal range of motion. He exhibits no edema and no tenderness.  Neurological: He is alert and oriented to person, place, and time.  Skin: Skin is warm and dry. No rash noted. He is not diaphoretic. No erythema. No pallor.  Psychiatric: He has a normal mood and affect. His behavior is normal. Judgment and thought content normal.    Data Reviewed   Assessment    Umbilical hernia-reducible He does have an easily reducible umbilical hernia. We discussed the options for repair including laparoscopic and open repair and I think that this  Is small enough for open repair. We discussed the pros and cons of each of them  the risks and benefits of the procedure. We discussed the risks of infection, bleeding, pain, scarring, recurrence, bowel injury, poor cosmesis and death. He expressed understanding and would like to proceed with umbilical hernia repair with possible mesh soon as available    Plan    We will go ahead and set him up for open umbilical hernia repair with mesh as soon as available.       Lodema Pilot DAVID 04/02/2012, 4:33 PM

## 2012-04-07 NOTE — Brief Op Note (Signed)
04/07/2012  5:30 PM  PATIENT:  Samuel Irwin  52 y.o. male  PRE-OPERATIVE DIAGNOSIS:  umbilical hernia  POST-OPERATIVE DIAGNOSIS:  Umbilical Hernia  PROCEDURE:  Procedure(s) with comments: HERNIA REPAIR UMBILICAL ADULT (N/A) - open umbilical hernia repair with mesh INSERTION OF MESH (N/A)  SURGEON:  Surgeon(s) and Role:    * Lodema Pilot, DO - Primary  PHYSICIAN ASSISTANT:   ASSISTANTS: none   ANESTHESIA:   general  EBL:  Total I/O In: 1000 [I.V.:1000] Out: -   BLOOD ADMINISTERED:none  DRAINS: none   LOCAL MEDICATIONS USED:  MARCAINE    and LIDOCAINE   SPECIMEN:  No Specimen  DISPOSITION OF SPECIMEN:  N/A  COUNTS:  YES  TOURNIQUET:  * No tourniquets in log *  DICTATION: .Other Dictation: Dictation Number [id number]  PLAN OF CARE: Discharge to home after PACU  PATIENT DISPOSITION:  PACU - hemodynamically stable.   Delay start of Pharmacological VTE agent (>24hrs) due to surgical blood loss or risk of bleeding: no

## 2012-04-07 NOTE — Interval H&P Note (Signed)
History and Physical Interval Note:  04/07/2012 3:29 PM  [name]  has presented today for surgery, with the diagnosis of umbilical hernia  The various methods of treatment have been discussed with the patient and family. After consideration of risks, benefits and other options for treatment, the patient has consented to  Procedure(s) with comments: HERNIA REPAIR UMBILICAL ADULT (N/A) - open umbilical hernia repair with mesh INSERTION OF MESH (N/A) as a surgical intervention .  The patient's history has been reviewed, patient examined, no change in status, stable for surgery.  I have reviewed the patient's chart and labs.  Questions were answered to the patient's satisfaction.  He was seen in the preop area.  Site marked.  Risks of the procedure again discussed in lay terms including infection, bleeding, pain, persistent pain and symptoms, recurrence, seroma, need for mesh, bowel injury  Discussed and he desires to proceed with open umbilical hernia repair with possible mesh.   Lodema Pilot DAVID

## 2012-04-08 ENCOUNTER — Telehealth (INDEPENDENT_AMBULATORY_CARE_PROVIDER_SITE_OTHER): Payer: Self-pay | Admitting: General Surgery

## 2012-04-08 NOTE — Op Note (Signed)
NAME:  Samuel Irwin, Samuel Irwin NO.:  [id number]  MEDICAL RECORD NO.:  [id number]  LOCATION:  [facility]                         FACILITY:  [facility]  PHYSICIAN:  Lodema Pilot, MD       DATE OF BIRTH:  1960/04/18  DATE OF PROCEDURE:  04/07/2012 DATE OF DISCHARGE:                              OPERATIVE REPORT   PROCEDURE:  Open umbilical hernia repair with mesh.  PREOPERATIVE DIAGNOSIS:  Umbilical hernia.  POSTOPERATIVE DIAGNOSIS:  Umbilical hernia.  SURGEON:  Lodema Pilot, MD  ASSISTANT:  None.  ANESTHESIA:  General endotracheal anesthesia with 40 mL of 1% lidocaine with epinephrine and 0.25% Marcaine in a 50:50 mixture.  FLUIDS:  1600 mL of crystalloid.  ESTIMATED BLOOD LOSS:  Minimal.  DRAINS:  None.  SPECIMENS:  None.  COMPLICATIONS:  None apparent.  FINDINGS:  A 2 cm fascial defect at the umbilicus with some incarcerated preperitoneal fat.  Primary repair of the fascial defect over the 4.3 cm x 4.3 cm Proceed ventral patch.  INDICATIONS FOR PROCEDURE:  Samuel Irwin is a 52 year old male with a symptomatic umbilical hernia.  He desires repair.  OPERATIVE DETAILS:  Samuel Irwin was seen and evaluated in preoperative area.  Risks and benefits of the procedure were discussed in lay terms. Informed consent was obtained.  Surgical site was marked prior to anesthetic administration and he was given prophylactic antibiotics and taken to the operating room, placed on table in a supine position. General endotracheal tube anesthesia was obtained.  His abdomen was prepped and draped in a standard surgical fashion.  Procedure time-out was performed with all operative team members to confirm proper patient and procedure, and an infraumbilical incision was made in the skin and dissection carried down to the abdominal wall fascia using Bovie electrocautery.  The hernia sac was entered and he had some incarcerated preperitoneal fat within the defect.  I dissected the  sac circumferentially at the edge of the fascia, and after I had elevated the umbilical stalk off of the hernia sac and divided the hernia sac circumferentially at the fascial edge, I opened the peritoneum to confirm that there was no evidence of any bowel contents in the hernia, and indeed, this was only preperitoneal fat up in the hernia sac.  The fat was divided.  The defect remaining was approximately 2 cm, and I decided to perform primary repair of the fascia over a 4.3 cm x 4.3 cm fascial defect using the Proceed ventral patch.  A 2-0 Prolene sutures were placed at the 12 o'clock, 3 o'clock, 6 o'clock, and 9 o'clock positions on the mesh and these Prolene sutures were brought up transvaginally with each suture to be placed under direct visualization to ensure no evidence of bowel injury.  After all 4 sutures were placed through the fascia, the mesh was placed into the abdomen and the sutures were secured.  Then, the tails of the mesh were trimmed and then, the fascial edges were approximated with interrupted 0 Ethibond sutures and tails of the mesh were incorporated with each suture keeping the mesh adherent to the abdominal wall.  After the edge of interrupted 0 Ethibond sutures were placed under direct visualization, then  the sutures were secured to approximate the fascial edges.  At this time, his anesthesia was light, and he started coughing and bucking the ventilator and we were able to tell that the repair was solid.  I irrigated the wound with sterile saline solution and injected the wound and the fascia with 40 mL of 1% lidocaine with epinephrine and 0.25% Marcaine in a 50:50 mixture.  The umbilical stalk was then sutured to the underlying fascia with 2 interrupted 3-0 Vicryl sutures and the sutures were secured and the dermis was approximated with interrupted 3- 0 Vicryl sutures.  Skin edges were approximated with 4-0 Monocryl subcuticular suture.  Skin was washed and dried  and Dermabond was applied, and after Dermabond was applied, a sterile suction dressing was applied.  All sponge, needle, and instrument counts were correct at the end of the case.  The patient tolerated the procedure well without apparent complication.  Because it was a difficult intubation, he was awoken from his anesthesia very lightly and he was coughing and bucking the ventilator, but the repair appeared to be remain intact.  He was stable and ready for transfer to the recovery room in stable condition.          ______________________________ Lodema Pilot, MD     BL/MEDQ  D:  04/07/2012  T:  04/07/2012  Job:  [id number]

## 2012-04-08 NOTE — Telephone Encounter (Signed)
Spoke with patient he has  Po f/u visit for 04/29/12. He question why is his tongue black and sore . I explained I did not feel like it has anything to do with his [facility] hernia repair . But this message was given to his nurse .Christy.

## 2012-04-09 ENCOUNTER — Ambulatory Visit (INDEPENDENT_AMBULATORY_CARE_PROVIDER_SITE_OTHER): Payer: Self-pay | Admitting: General Surgery

## 2012-04-09 ENCOUNTER — Encounter (HOSPITAL_COMMUNITY): Payer: Self-pay | Admitting: General Surgery

## 2012-04-09 NOTE — Consult Note (Signed)
Mr. Samuel Irwin [phone]) called Pam at the OR front desk a few minutes ago. I immediately called the patient and did not get answer. He called back to the OR front desk and I talked to him. The patient stated that he tongue had a black mark that was getting dissipating and had a sore throat. Patient was talking well without difficulty.   I informed the patient to come to the ER if he could to get it evaluated if he felt it was not improving. I also informed the patient to call Dr. Delice Lesch office to inform them of his concerns.   I also informed the patient to call us anytime again if he had questions. CE

## 2012-04-09 NOTE — Addendum Note (Signed)
Addendum created 04/09/12 1121 by Judie Petit, MD   Modules edited: Clinical Notes   Clinical Notes:  File: [id number]

## 2012-04-13 ENCOUNTER — Emergency Department (INDEPENDENT_AMBULATORY_CARE_PROVIDER_SITE_OTHER)
Admission: EM | Admit: 2012-04-13 | Discharge: 2012-04-13 | Disposition: A | Discharge status: Home / Self Care | Payer: No Typology Code available for payment source | Source: Home / Self Care | Attending: Family Medicine | Admitting: Family Medicine

## 2012-04-13 ENCOUNTER — Encounter (HOSPITAL_COMMUNITY): Payer: Self-pay

## 2012-04-13 ENCOUNTER — Encounter (INDEPENDENT_AMBULATORY_CARE_PROVIDER_SITE_OTHER): Payer: Self-pay | Admitting: General Surgery

## 2012-04-13 DIAGNOSIS — K089 Disorder of teeth and supporting structures, unspecified: Secondary | ICD-10-CM

## 2012-04-13 DIAGNOSIS — K0889 Other specified disorders of teeth and supporting structures: Secondary | ICD-10-CM

## 2012-04-13 MED ORDER — ALUMINUM CHLORIDE 20 % EX SOLN: Freq: Every day | CUTANEOUS | Status: DC

## 2012-04-13 NOTE — ED Provider Notes (Signed)
History     CSN: [id number]  Arrival date & time 04/13/12  1149   First MD Initiated Contact with Patient 04/13/12 1229      Chief Complaint  Patient presents with  . Dental Pain   HPI Reports that after he recently had surgery done to repair an umbilical hernia he is now having dental pain from where he was placed under anesthesia and had a tube placed down his throat.  He reports that he's been having significant dental pain and would like to see a dentist.  He also requests a refill on his Cialis medication that he's been getting for years but does not remember the dosage.  Past Medical History  Diagnosis Date  . GERD (gastroesophageal reflux disease)   . Umbilical hernia   . Constipation   . Cough   . Wheezing   . Weakness   . Generalized headaches   . Heart murmur   . Shortness of breath     occ exersion  . Pneumonia     hx  . Sinusitis   . H/O hiatal hernia     Past Surgical History  Procedure Laterality Date  . Femur surgery      left  . Toe surgery      dislocated rt foot  . Carpal tunnel release      R hand  . Umbilical hernia repair N/A 04/07/2012    Procedure: HERNIA REPAIR UMBILICAL ADULT;  Surgeon: Lodema Pilot, DO;  Location: [facility] OR;  Service: General;  Laterality: N/A;  open umbilical hernia repair with mesh  . Insertion of mesh N/A 04/07/2012    Procedure: INSERTION OF MESH;  Surgeon: Lodema Pilot, DO;  Location: [facility] OR;  Service: General;  Laterality: N/A;    Family History  Problem Relation Age of Onset  . Cancer Father     lung    History  Substance Use Topics  . Smoking status: Current Every Day Smoker -- 0.50 packs/day for 15 years    Types: Cigarettes  . Smokeless tobacco: Not on file     Comment: smoke when drink  . Alcohol Use: 7.2 oz/week    12 Cans of beer per week    Review of Systems  HENT: Positive for dental problem.   All other systems reviewed and are negative.    Allergies  Review of patient's allergies indicates no  known allergies.  Home Medications   Current Outpatient Rx  Name  Route  Sig  Dispense  Refill  . acyclovir (ZOVIRAX) 400 MG tablet   Oral   Take 400 mg by mouth 2 (two) times daily.         . Aspirin Effervescent (ALKA-SELTZER PO)   Oral   Take 2 tablets by mouth daily as needed. For congestion         . Multiple Vitamin (MULTIVITAMIN WITH MINERALS) TABS   Oral   Take 1 tablet by mouth daily.         . naproxen sodium (ANAPROX) 220 MG tablet   Oral   Take 220 mg by mouth daily as needed. For pain         . oxyCODONE (ROXICODONE) 5 MG immediate release tablet   Oral   Take 1-2 tablets (5-10 mg total) by mouth every 4 (four) hours as needed for pain.   45 tablet   0   . predniSONE (DELTASONE) 50 MG tablet   Oral   Take 1 tablet (50 mg total) by mouth  daily.   7 tablet   0   . Tetrahydroz-Dextran-PEG-Povid (VISINE ADVANCED RELIEF OP)   Both Eyes   Place 2 drops into both eyes daily as needed (q). For eye dryness         . Throat Lozenges (COUGH DROPS MT)   Mouth/Throat   Use as directed 1 lozenge in the mouth or throat 3 (three) times daily as needed. For cough           BP 138/92  Pulse 66  Temp(Src) 98.2 F (36.8 C) (Oral)  Physical Exam  Nursing note and vitals reviewed. Constitutional: He is oriented to person, place, and time. He appears well-developed and well-nourished. No distress.  HENT:  Head: Normocephalic and atraumatic.  Mouth/Throat: Abnormal dentition. Dental caries present.  Eyes: Conjunctivae and EOM are normal. Pupils are equal, round, and reactive to light.  Neck: Normal range of motion. Neck supple.  Cardiovascular: Normal rate, regular rhythm and normal heart sounds.   Pulmonary/Chest: Effort normal and breath sounds normal.  Abdominal: Soft. Bowel sounds are normal.  Healing umbilical wound  Musculoskeletal: Normal range of motion.  Neurological: He is alert and oriented to person, place, and time.  Skin: Skin is warm and  dry.  Psychiatric: He has a normal mood and affect. His behavior is normal. Judgment and thought content normal.    ED Course  Procedures (including critical care time)  Labs Reviewed - No data to display No results found.  No diagnosis found.  MDM  IMPRESSION  Dental Pain  S/p umbilical hernia surgery   RECOMMENDATIONS / PLAN Dental referral   FOLLOW UP 3 months  The patient was given clear instructions to go to ER or return to medical center if symptoms don't improve, worsen or new problems develop.  The patient verbalized understanding.  The patient was told to call to get lab results if they haven't heard anything in the next week.           Cleora Fleet, MD 04/13/12 1404

## 2012-04-13 NOTE — ED Notes (Signed)
Patient states since his hernia surgery he has been having teeth pain- Needs referral to dentist

## 2012-04-13 NOTE — Consult Note (Signed)
Pt showed up in OR with complaint of a numb distal tongue with molar toothache. Pt described tongue being bruised postop but that has gone. Pt was intubated with Glidescope by Leona Singleton CRNA and Dr Brayton Caves. No trauma indicated. Oral Airway used.   O/E not much seen.  I advised the patient that his tongue was probably trapped between the ETT and airway and the pressure probably caused a bruise with the resultant numbness.  I advised him there was nothing to be done at this stage and to call us in 10 days if it is not resolved.  Arta Bruce MD

## 2012-04-13 NOTE — ED Notes (Signed)
Referral faxed to [facility]  Waiting for appt

## 2012-04-13 NOTE — Addendum Note (Signed)
Addendum created 04/13/12 1159 by Aubery Lapping, MD   Modules edited: Clinical Notes   Clinical Notes:  File: [id number]

## 2012-04-13 NOTE — Progress Notes (Signed)
This patient walked in office today with complaints of lt sided upper and lower back tooth pain and lump on bottom of tongue . I encouraged this patient he needed to contact the Dr who put him to sleep for the surgery he had and told him I did not think any of this was coming from his [facility] hernia repair. He was going directly to the hospital  .

## 2012-04-15 NOTE — ED Notes (Signed)
Received record of office visit from [facility]

## 2012-04-29 ENCOUNTER — Encounter (INDEPENDENT_AMBULATORY_CARE_PROVIDER_SITE_OTHER): Payer: PRIVATE HEALTH INSURANCE | Admitting: General Surgery

## 2012-05-20 ENCOUNTER — Ambulatory Visit (INDEPENDENT_AMBULATORY_CARE_PROVIDER_SITE_OTHER): Payer: PRIVATE HEALTH INSURANCE | Admitting: General Surgery

## 2012-05-20 ENCOUNTER — Encounter (INDEPENDENT_AMBULATORY_CARE_PROVIDER_SITE_OTHER): Payer: Self-pay | Admitting: General Surgery

## 2012-05-20 VITALS — BP 120/80 | HR 91 | Temp 97.6°F | Ht 68.0 in | Wt 257.4 lb

## 2012-05-20 DIAGNOSIS — Z5189 Encounter for other specified aftercare: Secondary | ICD-10-CM

## 2012-05-20 DIAGNOSIS — Z4889 Encounter for other specified surgical aftercare: Secondary | ICD-10-CM

## 2012-05-20 NOTE — Progress Notes (Signed)
Subjective:     Patient ID: Samuel Irwin, male   DOB: [birth year], 52 y.o.   MRN: [id number]  HPI This patient follows up 6 weeks status post open umbilical hernia repair with mesh. He is doing well and his only complaint is his bowels. He has to take stool softeners to move his bowels but he says that he was doing this prior to his procedure as well. He is off pain medication and denies any pain around the incision.  Review of Systems     Objective:   Physical Exam His abdomen is soft and nontender on exam I do not see any evidence of recurrent hernia. He does have a normal healing ridge and some firmness under his umbilicus which I think is normal for this stage postoperatively.    Assessment:     Status post open umbilical hernia repair with mesh Think that he is doing well for the stage postoperatively. I recommend that he follow up with me in 3 months if this hardness at his umbilicus does not continue to soften as I think that this is normal postoperatively. I do not think that he has any evidence of recurrent hernia. He can gradually increase activity as tolerated and I will see him back in 3 months if he continues to have some hardness in the area     Plan:     Gradually increase activity as tolerated and I will see him back in 3 months if he continues to have some hard tissue in the area of the umbilicus

## 2012-06-18 ENCOUNTER — Encounter (INDEPENDENT_AMBULATORY_CARE_PROVIDER_SITE_OTHER): Payer: PRIVATE HEALTH INSURANCE | Admitting: General Surgery

## 2012-08-20 ENCOUNTER — Ambulatory Visit (INDEPENDENT_AMBULATORY_CARE_PROVIDER_SITE_OTHER): Payer: PRIVATE HEALTH INSURANCE | Admitting: General Surgery

## 2012-08-24 ENCOUNTER — Ambulatory Visit: Payer: No Typology Code available for payment source | Attending: Family Medicine | Admitting: Family Medicine

## 2012-08-24 ENCOUNTER — Encounter: Payer: Self-pay | Admitting: Family Medicine

## 2012-08-24 VITALS — BP 143/85 | HR 94 | Temp 98.7°F | Resp 20 | Ht 68.0 in | Wt 256.4 lb

## 2012-08-24 DIAGNOSIS — G47419 Narcolepsy without cataplexy: Secondary | ICD-10-CM | POA: Insufficient documentation

## 2012-08-24 DIAGNOSIS — G4733 Obstructive sleep apnea (adult) (pediatric): Secondary | ICD-10-CM | POA: Insufficient documentation

## 2012-08-24 DIAGNOSIS — E8881 Metabolic syndrome: Secondary | ICD-10-CM

## 2012-08-24 NOTE — Progress Notes (Signed)
Pt will call for f/u appt in 3 mnths.

## 2012-08-24 NOTE — Patient Instructions (Addendum)
Sleep Apnea Sleep apnea is disorder that affects a person's sleep. A person with sleep apnea has abnormal pauses in their breathing when they sleep. It is hard for them to get a good sleep. This makes a person tired during the day. It also can lead to other physical problems. There are three types of sleep apnea. One type is when breathing stops for a short time because your airway is blocked (obstructive sleep apnea). Another type is when the brain sometimes fails to give the normal signal to breathe to the muscles that control your breathing (central sleep apnea). The third type is a combination of the other two types. HOME CARE  Do not sleep on your back. Try to sleep on your side.  Take all medicine as told by your doctor.  Avoid alcohol, calming medicines (sedatives), and depressant drugs.  Try to lose weight if you are overweight. Talk to your doctor about a healthy weight goal. Your doctor may have you use a device that helps to open your airway. It can help you get the air that you need. It is called a positive airway pressure (PAP) device. There are three types of PAP devices:  Continuous positive airway pressure (CPAP) device.  Nasal expiratory positive airway pressure (EPAP) device.  Bilevel positive airway pressure (BPAP) device. MAKE SURE YOU:  Understand these instructions.  Will watch your condition.  Will get help right away if you are not doing well or get worse. Document Released: 11/21/2007 Document Revised: 01/29/2012 Document Reviewed: 06/15/2011 [facility] Patient Information 2014 [place], [place]. Narcolepsy Narcolepsy is a disabling neurological disorder of sleep regulation. It affects the control of sleep. It also affects the control of wakefulness. It is an interruption of the dreaming state of sleep. This state is known as REM or rapid eye movement sleep.  SYMPTOMS  The development, number, and severity of symptoms vary widely among people with the disorder.  Symptoms generally begin between the ages of 39 and 49. The four classic symptoms of the disorder are:   Excessive daytime sleepiness.  Cataplexy. This is sudden, brief episodes of muscle weakness or paralysis. It is caused by strong emotions. Common strong emotions are laughter, anger, surprise, or anticipation.  Sleep paralysis. This is paralysis upon falling asleep or waking up.  Hallucinations. These are vivid dream-like images that occur at when you first fall asleep. Other symptoms include:   Unrelenting excessive sleepiness. This is usually the first and most obvious symptom.  Sleep attacks. Patients have strong sleep attacks throughout the day. These attacks can last for 30 seconds to more than 30 minutes. These happen no matter how much or how well the person slept the night before. These attacks end up making the person sleep at work and social events. The person can fall asleep while eating, talking, and driving. They also fall asleep at other out of place times.  Disturbed nighttime sleep.  Tossing and turning in bed.  Leg jerks.  Nightmares.  Waking up often. DIAGNOSIS  It's possible that genetics play a role in this disorder. Narcolepsy is not a rare disorder. It is often misdiagnosed. It is often diagnosed years after symptoms first appear. Early diagnosis and treatment are important. This help the physical and mental well-being of the patient. TREATMENT  There is no cure for narcolepsy. The symptoms can be controlled with behavioral and medical therapy. The excessive daytime sleepiness may be treated with stimulant drugs. It may also be treated with the drug modafinil (Provigil). Cataplexy and  other REM-sleep symptoms may be treated with antidepressant medications. Medications will reduce the symptoms. Medications will not ease symptoms entirely. Many available medications have side effects. Basic lifestyle changes may also reduce the symptoms. These changes include having  regular sleep schedules and scheduled daytime naps. Other lifestyle changes include avoiding "over-stimulating" situations. Document Released: 02/01/2002 Document Revised: 05/06/2011 Document Reviewed: 02/11/2005 [facility] Patient Information 2014 [place], [place].  Smoking Cessation, Tips for Success YOU CAN QUIT SMOKING If you are ready to quit smoking, congratulations! You have chosen to help yourself be healthier. Cigarettes bring nicotine, tar, carbon monoxide, and other irritants into your body. Your lungs, heart, and blood vessels will be able to work better without these poisons. There are many different ways to quit smoking. Nicotine gum, nicotine patches, a nicotine inhaler, or nicotine nasal spray can help with physical craving. Hypnosis, support groups, and medicines help break the habit of smoking. Here are some tips to help you quit for good.  Throw away all cigarettes.  Clean and remove all ashtrays from your home, work, and car.  On a card, write down your reasons for quitting. Carry the card with you and read it when you get the urge to smoke.  Cleanse your body of nicotine. Drink enough water and fluids to keep your urine clear or pale yellow. Do this after quitting to flush the nicotine from your body.  Learn to predict your moods. Do not let a bad situation be your excuse to have a cigarette. Some situations in your life might tempt you into wanting a cigarette.  Never have "just one" cigarette. It leads to wanting another and another. Remind yourself of your decision to quit.  Change habits associated with smoking. If you smoked while driving or when feeling stressed, try other activities to replace smoking. Stand up when drinking your coffee. Brush your teeth after eating. Sit in a different chair when you read the paper. Avoid alcohol while trying to quit, and try to drink fewer caffeinated beverages. Alcohol and caffeine may urge you to smoke.  Avoid foods and drinks that  can trigger a desire to smoke, such as sugary or spicy foods and alcohol.  Ask people who smoke not to smoke around you.  Have something planned to do right after eating or having a cup of coffee. Take a walk or exercise to perk you up. This will help to keep you from overeating.  Try a relaxation exercise to calm you down and decrease your stress. Remember, you may be tense and nervous for the first 2 weeks after you quit, but this will pass.  Find new activities to keep your hands busy. Play with a pen, coin, or rubber band. Doodle or draw things on paper.  Brush your teeth right after eating. This will help cut down on the craving for the taste of tobacco after meals. You can try mouthwash, too.  Use oral substitutes, such as lemon drops, carrots, a cinnamon stick, or chewing gum, in place of cigarettes. Keep them handy so they are available when you have the urge to smoke.  When you have the urge to smoke, try deep breathing.  Designate your home as a nonsmoking area.  If you are a heavy smoker, ask your caregiver about a prescription for nicotine chewing gum. It can ease your withdrawal from nicotine.  Reward yourself. Set aside the cigarette money you save and buy yourself something nice.  Look for support from others. Join a support group or smoking  cessation program. Ask someone at home or at work to help you with your plan to quit smoking.  Always ask yourself, "Do I need this cigarette or is this just a reflex?" Tell yourself, "Today, I choose not to smoke," or "I do not want to smoke." You are reminding yourself of your decision to quit, even if you do smoke a cigarette. HOW WILL I FEEL WHEN I QUIT SMOKING?  The benefits of not smoking start within days of quitting.  You may have symptoms of withdrawal because your body is used to nicotine (the addictive substance in cigarettes). You may crave cigarettes, be irritable, feel very hungry, cough often, get headaches, or have  difficulty concentrating.  The withdrawal symptoms are only temporary. They are strongest when you first quit but will go away within 10 to 14 days.  When withdrawal symptoms occur, stay in control. Think about your reasons for quitting. Remind yourself that these are signs that your body is healing and getting used to being without cigarettes.  Remember that withdrawal symptoms are easier to treat than the major diseases that smoking can cause.  Even after the withdrawal is over, expect periodic urges to smoke. However, these cravings are generally short-lived and will go away whether you smoke or not. Do not smoke!  If you relapse and smoke again, do not lose hope. Most smokers quit 3 times before they are successful.  If you relapse, do not give up! Plan ahead and think about what you will do the next time you get the urge to smoke. LIFE AS A NONSMOKER: MAKE IT FOR A MONTH, MAKE IT FOR LIFE Day 1: Hang this page where you will see it every day. Day 2: Get rid of all ashtrays, matches, and lighters. Day 3: Drink water. Breathe deeply between sips. Day 4: Avoid places with smoke-filled air, such as bars, clubs, or the smoking section of restaurants. Day 5: Keep track of how much money you save by not smoking. Day 6: Avoid boredom. Keep a good book with you or go to the movies. Day 7: Reward yourself! One week without smoking! Day 8: Make a dental appointment to get your teeth cleaned. Day 9: Decide how you will turn down a cigarette before it is offered to you. Day 10: Review your reasons for quitting. Day 11: Distract yourself. Stay active to keep your mind off smoking and to relieve tension. Take a walk, exercise, read a book, do a crossword puzzle, or try a new hobby. Day 12: Exercise. Get off the bus before your stop or use stairs instead of escalators. Day 13: Call on friends for support and encouragement. Day 14: Reward yourself! Two weeks without smoking! Day 15: Practice deep  breathing exercises. Day 16: Bet a friend that you can stay a nonsmoker. Day 17: Ask to sit in nonsmoking sections of restaurants. Day 18: Hang up "No Smoking" signs. Day 19: Think of yourself as a nonsmoker. Day 20: Each morning, tell yourself you will not smoke. Day 21: Reward yourself! Three weeks without smoking! Day 22: Think of smoking in negative ways. Remember how it stains your teeth, gives you bad breath, and leaves you short of breath. Day 23: Eat a nutritious breakfast. Day 24:Do not relive your days as a smoker. Day 25: Hold a pencil in your hand when talking on the telephone. Day 26: Tell all your friends you do not smoke. Day 27: Think about how much better food tastes. Day 28: Remember, one cigarette  is one too many. Day 29: Take up a hobby that will keep your hands busy. Day 30: Congratulations! One month without smoking! Give yourself a big reward. Your caregiver can direct you to community resources or hospitals for support, which may include:  Group support.  Education.  Hypnosis.  Subliminal therapy. Document Released: 11/10/2003 Document Revised: 05/06/2011 Document Reviewed: 11/28/2008 [facility] Patient Information 2014 [place], [place].  Alcohol Problems Most adults who drink alcohol drink in moderation (not a lot) are at low risk for developing problems related to their drinking. However, all drinkers, including low-risk drinkers, should know about the health risks connected with drinking alcohol. RECOMMENDATIONS FOR LOW-RISK DRINKING  Drink in moderation. Moderate drinking is defined as follows:   Men - no more than 2 drinks per day.  Nonpregnant women - no more than 1 drink per day.  Over age 61 - no more than 1 drink per day. A standard drink is 12 grams of pure alcohol, which is equal to a 12 ounce bottle of beer or wine cooler, a 5 ounce glass of wine, or 1.5 ounces of distilled spirits (such as whiskey, brandy, vodka, or rum).  ABSTAIN FROM (DO NOT  DRINK) ALCOHOL:  When pregnant or considering pregnancy.  When taking a medication that interacts with alcohol.  If you are alcohol dependent.  A medical condition that prohibits drinking alcohol (such as ulcer, liver disease, or heart disease). DISCUSS WITH YOUR CAREGIVER:  If you are at risk for coronary heart disease, discuss the potential benefits and risks of alcohol use: Light to moderate drinking is associated with lower rates of coronary heart disease in certain populations (for example, men over age 73 and postmenopausal women). Infrequent or nondrinkers are advised not to begin light to moderate drinking to reduce the risk of coronary heart disease so as to avoid creating an alcohol-related problem. Similar protective effects can likely be gained through proper diet and exercise.  Women and the elderly have smaller amounts of body water than men. As a result women and the elderly achieve a higher blood alcohol concentration after drinking the same amount of alcohol.  Exposing a fetus to alcohol can cause a broad range of birth defects referred to as Fetal Alcohol Syndrome (FAS) or Alcohol-Related Birth Defects (ARBD). Although FAS/ARBD is connected with excessive alcohol consumption during pregnancy, studies also have reported neurobehavioral problems in infants born to mothers reporting drinking an average of 1 drink per day during pregnancy.  Heavier drinking (the consumption of more than 4 drinks per occasion by men and more than 3 drinks per occasion by women) impairs learning (cognitive) and psychomotor functions and increases the risk of alcohol-related problems, including accidents and injuries. CAGE QUESTIONS:   Have you ever felt that you should Cut down on your drinking?  Have people Annoyed you by criticizing your drinking?  Have you ever felt bad or Guilty about your drinking?  Have you ever had a drink first thing in the morning to steady your nerves or get rid of a  hangover (Eye opener)? If you answered positively to any of these questions: You may be at risk for alcohol-related problems if alcohol consumption is:   Men: Greater than 14 drinks per week or more than 4 drinks per occasion.  Women: Greater than 7 drinks per week or more than 3 drinks per occasion. Do you or your family have a medical history of alcohol-related problems, such as:  Blackouts.  Sexual dysfunction.  Depression.  Trauma.  Liver  dysfunction.  Sleep disorders.  Hypertension.  Chronic abdominal pain.  Has your drinking ever caused you problems, such as problems with your family, problems with your work (or school) performance, or accidents/injuries?  Do you have a compulsion to drink or a preoccupation with drinking?  Do you have poor control or are you unable to stop drinking once you have started?  Do you have to drink to avoid withdrawal symptoms?  Do you have problems with withdrawal such as tremors, nausea, sweats, or mood disturbances?  Does it take more alcohol than in the past to get you high?  Do you feel a strong urge to drink?  Do you change your plans so that you can have a drink?  Do you ever drink in the morning to relieve the shakes or a hangover? If you have answered a number of the previous questions positively, it may be time for you to talk to your caregivers, family, and friends and see if they think you have a problem. Alcoholism is a chemical dependency that keeps getting worse and will eventually destroy your health and relationships. Many alcoholics end up dead, impoverished, or in prison. This is often the end result of all chemical dependency.  Do not be discouraged if you are not ready to take action immediately.  Decisions to change behavior often involve up and down desires to change and feeling like you cannot decide.  Try to think more seriously about your drinking behavior.  Think of the reasons to quit. WHERE TO GO FOR  ADDITIONAL INFORMATION   The National Institute on Alcohol Abuse and Alcoholism (NIAAA) BasicStudents.dk  [employer] on Alcoholism and Drug Dependence (NCADD) www.ncadd.org  American Society of Addiction Medicine (ASAM) RoyalDiary.gl  Document Released: 02/11/2005 Document Revised: 05/06/2011 Document Reviewed: 09/30/2007 [facility] Patient Information 2014 [place], [place].

## 2012-08-24 NOTE — Progress Notes (Signed)
Patient ID: Samuel Irwin, male   DOB: [birth year], 52 y.o.   MRN: [id number]  CC: sleep problems  HPI: Pt says that he can't stay awake during the day.  Pt says that he was fired on his new job after 1 week because of falling asleep on the job.  Pt says he snores a loud.  He says that he stops breathing at night when sleeping some times.  Pt wants to get himself a sleep study and get evaluated.  Pt says he breathes hard and gets injured sometimes when falling asleep spontaneously.    Pt comes in with c/o difficulty staying awake,nodding off at work with buckling of knees and fatigue. Sx has been ongoing. States he was told to f/u sleep study for poss Bipap/Cpap. Affecting lifestyle. Sob while sleeping.pt is smoker. sats 94-95% r/a. Denies pain      No Known Allergies Past Medical History  Diagnosis Date  . GERD (gastroesophageal reflux disease)   . Umbilical hernia   . Constipation   . Cough   . Wheezing   . Weakness   . Generalized headaches   . Heart murmur   . Shortness of breath     occ exersion  . Pneumonia     hx  . Sinusitis   . H/O hiatal hernia    Current Outpatient Prescriptions on File Prior to Visit  Medication Sig Dispense Refill  . acyclovir (ZOVIRAX) 400 MG tablet Take 400 mg by mouth 2 (two) times daily.      Marland Kitchen aluminum chloride (DRYSOL) 20 % external solution Apply topically at bedtime.  38 mL  0  . Multiple Vitamin (MULTIVITAMIN WITH MINERALS) TABS Take 1 tablet by mouth daily.      . naproxen sodium (ANAPROX) 220 MG tablet Take 220 mg by mouth daily as needed. For pain      . oxyCODONE (ROXICODONE) 5 MG immediate release tablet Take 1-2 tablets (5-10 mg total) by mouth every 4 (four) hours as needed for pain.  45 tablet  0  . Tetrahydroz-Dextran-PEG-Povid (VISINE ADVANCED RELIEF OP) Place 2 drops into both eyes daily as needed (q). For eye dryness       No current facility-administered medications on file prior to visit.   Family History  Problem Relation  Age of Onset  . Cancer Father     lung   History   Social History  . Marital Status: Single    Spouse Name: N/A    Number of Children: N/A  . Years of Education: N/A   Occupational History  . Not on file.   Social History Main Topics  . Smoking status: Current Every Day Smoker -- 0.50 packs/day for 15 years    Types: Cigarettes  . Smokeless tobacco: Not on file     Comment: smoke when drink  . Alcohol Use: 7.2 oz/week    12 Cans of beer per week  . Drug Use: No  . Sexually Active: Not on file   Other Topics Concern  . Not on file   Social History Narrative  . No narrative on file    Review of Systems  Constitutional: chronic fatigue HENT: Negative for ear pain, nosebleeds, congestion, facial swelling, rhinorrhea, neck pain, neck stiffness and ear discharge.   Eyes: Negative for pain, discharge, redness, itching and visual disturbance.  Respiratory: Negative for cough, choking, chest tightness, shortness of breath, wheezing and stridor.   Cardiovascular: Negative for chest pain, palpitations and leg swelling.  Gastrointestinal: Negative  for abdominal distention.  Genitourinary: Negative for dysuria, urgency, frequency, hematuria, flank pain, decreased urine volume, difficulty urinating and dyspareunia.  Musculoskeletal: Negative for back pain, joint swelling, arthralgias and gait problem.  Neurological: Negative for dizziness, tremors, seizures, syncope, facial asymmetry, speech difficulty, weakness, light-headedness, numbness and headaches.  Hematological: Negative for adenopathy. Does not bruise/bleed easily.  Psychiatric/Behavioral: Negative for hallucinations, behavioral problems, confusion, dysphoric mood, decreased concentration and agitation.    Objective:   Filed Vitals:   08/24/12 1704  BP: 143/85  Pulse: 94  Temp: 98.7 F (37.1 C)  Resp: 20    Physical Exam  Constitutional: Appears well-developed and well-nourished. No distress.  HENT:  Normocephalic. External right and left ear normal. Oropharynx is clear and moist.  Eyes: Conjunctivae and EOM are normal. PERRLA, no scleral icterus.  Neck: Normal ROM. Neck supple. No JVD. No tracheal deviation. No thyromegaly.  CVS: RRR, S1/S2 +, no murmurs, no gallops, no carotid bruit.  Pulmonary: Effort and breath sounds normal, no stridor, rhonchi, wheezes, rales.  Abdominal: large abdomen and pannus, Soft. BS +,  no distension, tenderness, rebound or guarding.  Musculoskeletal: Normal range of motion. No edema and no tenderness.  Lymphadenopathy: No lymphadenopathy noted, cervical, inguinal. Neuro: Alert. Normal reflexes, muscle tone coordination. No cranial nerve deficit. Skin: Skin is warm and dry. No rash noted. Not diaphoretic. No erythema. No pallor.  Psychiatric: Normal mood and affect. Behavior, judgment, thought content normal.   Lab Results  Component Value Date   WBC 8.7 04/06/2012   HGB 18.1* 04/06/2012   HCT 51.8 04/06/2012   MCV 91.0 04/06/2012   PLT 294 04/06/2012   Lab Results  Component Value Date   CREATININE 1.27 04/06/2012   BUN 18 04/06/2012   NA 142 04/06/2012   K 4.1 04/06/2012   CL 106 04/06/2012   CO2 25 04/06/2012    Lab Results  Component Value Date   HGBA1C 5.5 03/20/2012   Lipid Panel     Component Value Date/Time   CHOL 201* 03/20/2012 1850   TRIG 422* 03/20/2012 1850   HDL 45 03/20/2012 1850   CHOLHDL 4.5 03/20/2012 1850   VLDL UNABLE TO CALCULATE IF TRIGLYCERIDE OVER 400 mg/dL 06/03/8117 1478   LDLCALC UNABLE TO CALCULATE IF TRIGLYCERIDE OVER 400 mg/dL 2/95/6213 0865       Assessment and plan:   Patient Active Problem List   Diagnosis Date Noted  . OSA (obstructive sleep apnea) 08/24/2012  . 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    Metabolic Syndrome, encouraged weight loss , exercise  Tobacco The patient was counseled on the dangers of tobacco use, and was advised to quit and referred to  a tobacco cessation program.  Reviewed strategies to maximize success, including removing cigarettes and smoking materials from environment.   Follow up in 3 months  The patient was given clear instructions to go to ER or return to medical center if symptoms don't improve, worsen or new problems develop.  The patient verbalized understanding.  The patient was told to call to get any lab results if not heard anything in the next week.    Rodney Langton, MD, CDE, FAAFP [facility] [place], [place]

## 2012-08-24 NOTE — Progress Notes (Signed)
Pt comes in with c/o difficulty staying awake,nodding off at work with buckling of knees and fatigue. Sx has been ongoing. States he was told to f/u sleep study for poss Bipap/Cpap. Affecting lifestyle. Sob while sleeping.pt is smoker. sats 94-95% r/a. Denies pain

## 2012-08-26 ENCOUNTER — Encounter (INDEPENDENT_AMBULATORY_CARE_PROVIDER_SITE_OTHER): Payer: Self-pay | Admitting: General Surgery

## 2012-08-26 ENCOUNTER — Ambulatory Visit (HOSPITAL_BASED_OUTPATIENT_CLINIC_OR_DEPARTMENT_OTHER): Payer: No Typology Code available for payment source | Attending: Family Medicine | Admitting: Radiology

## 2012-08-26 ENCOUNTER — Ambulatory Visit (INDEPENDENT_AMBULATORY_CARE_PROVIDER_SITE_OTHER): Payer: PRIVATE HEALTH INSURANCE | Admitting: General Surgery

## 2012-08-26 VITALS — Ht 68.0 in | Wt 253.0 lb

## 2012-08-26 VITALS — BP 130/88 | HR 66 | Temp 98.1°F | Resp 12 | Ht 68.0 in | Wt 253.2 lb

## 2012-08-26 DIAGNOSIS — G47419 Narcolepsy without cataplexy: Secondary | ICD-10-CM

## 2012-08-26 DIAGNOSIS — G4733 Obstructive sleep apnea (adult) (pediatric): Secondary | ICD-10-CM | POA: Insufficient documentation

## 2012-08-26 DIAGNOSIS — R0989 Other specified symptoms and signs involving the circulatory and respiratory systems: Secondary | ICD-10-CM | POA: Insufficient documentation

## 2012-08-26 DIAGNOSIS — Z9889 Other specified postprocedural states: Secondary | ICD-10-CM

## 2012-08-26 DIAGNOSIS — R0609 Other forms of dyspnea: Secondary | ICD-10-CM | POA: Insufficient documentation

## 2012-08-26 DIAGNOSIS — E8881 Metabolic syndrome: Secondary | ICD-10-CM

## 2012-08-26 DIAGNOSIS — Z8719 Personal history of other diseases of the digestive system: Secondary | ICD-10-CM

## 2012-08-26 NOTE — Progress Notes (Signed)
Subjective:     Patient ID: Samuel Irwin, male   DOB: 01-10-1961, 52 y.o.   MRN: [id number]  HPI This patient follows up for months status post open umbilical hernia repair with mesh. We had him come back to evaluate for some firm tissue at his umbilicus and he had perioperatively. He says that he has not noticed any bulge at his umbilicus and he has no significant tenderness or any symptoms. His bowels are functioning normally and has no limitations.  Review of Systems     Objective:   Physical Exam No distress and nontoxic-appearing His abdomen is soft and nontender on exam his incision is well-healed without any sign of infection is no evidence of recurrent hernia. There is no tenderness at his umbilicus and he does have some postoperative changes remaining in the area but again, I do not appreciate any sign of recurrent hernia    Assessment:     Status post open umbilical hernia repair  he seems to be recovering fine from his procedure without any evidence of any postoperative complications.Head no restrictions or limitations for him and he can gradually increase his activity as tolerated     Plan:     He can followup on a when necessary basis

## 2012-08-29 DIAGNOSIS — R0609 Other forms of dyspnea: Secondary | ICD-10-CM

## 2012-08-29 DIAGNOSIS — R0989 Other specified symptoms and signs involving the circulatory and respiratory systems: Secondary | ICD-10-CM

## 2012-08-29 DIAGNOSIS — G4733 Obstructive sleep apnea (adult) (pediatric): Secondary | ICD-10-CM

## 2012-08-30 NOTE — Procedures (Signed)
NAME:  Samuel Irwin, Samuel Irwin NO.:  [id number]  MEDICAL RECORD NO.:  [id number]          PATIENT TYPE:  OUT  LOCATION:  SLEEP CENTER                 FACILITY:  [facility]  PHYSICIAN:  Forrest Jaroszewski D. Maple Hudson, MD, FCCP, FACPDATE OF BIRTH:  19-Feb-1963  DATE OF STUDY:                           NOCTURNAL POLYSOMNOGRAM  REFERRING PHYSICIAN:  Standley Dakins, MD  INDICATION FOR STUDY:  Hypersomnia with sleep apnea.  EPWORTH SLEEPINESS SCORED:  21/24, BMI 38.5, weight 253 pounds, height 68 inches, neck 18 inches.  HOME MEDICATIONS:  Home medications are charted for review.  SLEEP ARCHITECTURE:  Split study protocol.  During the diagnostic phase, total sleep time 120 minutes with sleep efficiency 77.4%.  Stage I was 35.8%, stage II 56.7%.  Stage III absent, REM 7.5% of total sleep time. Sleep latency 11 minutes, REM latency 59.5 minutes, awake after sleep onset 12.5 minutes.  Arousal index 92.  Bedtime medication:  None.  RESPIRATORY DATA:  Split study protocol.  Apnea/hypopnea index (AHI) 86.5 per hour.  A total of 173 events was scored including 128 obstructive apneas, 4 mixed apneas, 41 hypopneas.  Events were not positional.  REM AHI 73.3 per hour.  CPAP was titrated to 16 CWP, AHI 3.7 per hour.  He wore a medium ResMed Mirage Quattro full-face mask with heated humidifier and/heated tubing.  OXYGEN DATA:  Before CPAP, snoring was moderately loud with oxygen desaturation to a nadir of 70% on room air.  After CPAP, snoring was prevented and mean oxygen saturation of 94.5% on room air.  CARDIAC DATA:  Sinus rhythm with PACs.  MOVEMENT-PARASOMNIA:  No significant movement disturbance.  Bathroom x1.  IMPRESSIONS-RECOMMENDATIONS: 1. Severe obstructive sleep apnea/hypopnea syndrome, AHI 86.5 per     hour.  Nonpositional.  Moderately loud snoring with oxygen     desaturation to a nadir of 70% on room air. 2. Successful CPAP titration to 16 CWP, AHI 3.7 per hour.  He wore a  medium ResMed Mirage Quattro full-     face mask with heated humidifier and heated flex tubing.  Snoring     was prevented and mean oxygen saturation held 94.5% on room air     with CPAP.     Trashaun Streight D. Maple Hudson, MD, [facility], FACP Diplomate, American Board of Sleep Medicine    CDY/MEDQ  D:  08/29/2012 10:20:57  T:  08/30/2012 01:11:10  Job:  [id number]

## 2012-09-03 ENCOUNTER — Ambulatory Visit (INDEPENDENT_AMBULATORY_CARE_PROVIDER_SITE_OTHER): Payer: PRIVATE HEALTH INSURANCE | Admitting: General Surgery

## 2012-09-07 ENCOUNTER — Telehealth (INDEPENDENT_AMBULATORY_CARE_PROVIDER_SITE_OTHER): Payer: Self-pay

## 2012-09-07 NOTE — Telephone Encounter (Signed)
Called & left message for patient with new appointment date & time for 10/02/12 @ 8:30 w/Dr. Biagio Quint.

## 2012-09-07 NOTE — Telephone Encounter (Signed)
Message copied by Maryan Puls on Mon Sep 07, 2012 10:13 AM ------      Message from: Marin Shutter      Created: Tue Aug 18, 2012  2:25 PM      Regarding: Dr. Biagio Quint      Contact: [phone]       This pt would need an 8 or after 4PM appt.  He started a new job, and can't make the appt otherwise.  Let me know if you find a spot for him, and I can call him back.  I couldn't put him in a spot that he could take off work. Thx ------

## 2012-09-11 ENCOUNTER — Telehealth: Payer: Self-pay | Admitting: Family Medicine

## 2012-09-11 NOTE — Telephone Encounter (Signed)
Pt completed sleep study, now needs a machine.  Pt lost job and can't afford machine and fitting ($194 for fitting,  $92/month) prescribed.  Pt needs adivce as to where to find discount.

## 2012-09-14 ENCOUNTER — Other Ambulatory Visit: Payer: No Typology Code available for payment source

## 2012-09-16 ENCOUNTER — Telehealth: Payer: Self-pay | Admitting: *Deleted

## 2012-09-16 NOTE — Telephone Encounter (Signed)
09/16/12 Will ask Social Worker to contact patient regarding a discount. P.Jonta Gastineau,RNBSN MHA

## 2012-09-20 ENCOUNTER — Encounter (HOSPITAL_BASED_OUTPATIENT_CLINIC_OR_DEPARTMENT_OTHER): Payer: No Typology Code available for payment source

## 2012-10-02 ENCOUNTER — Encounter (INDEPENDENT_AMBULATORY_CARE_PROVIDER_SITE_OTHER): Payer: Self-pay | Admitting: General Surgery

## 2012-12-25 ENCOUNTER — Telehealth: Payer: Self-pay | Admitting: Family Medicine

## 2012-12-25 NOTE — Telephone Encounter (Signed)
Spoke with regarding Cpap machine. Informed pt to f/u with sleep study @ [facility] for setting.

## 2012-12-25 NOTE — Telephone Encounter (Signed)
Patient came in and ask if the prescription on his CPAP machine. Patient believes the pressure is too high and constantly wakes him up at night. Please call patient

## 2012-12-30 ENCOUNTER — Ambulatory Visit: Payer: No Typology Code available for payment source | Attending: Internal Medicine | Admitting: Internal Medicine

## 2012-12-30 VITALS — BP 146/96 | HR 74 | Temp 98.0°F | Resp 16

## 2012-12-30 DIAGNOSIS — Z0000 Encounter for general adult medical examination without abnormal findings: Secondary | ICD-10-CM

## 2012-12-30 DIAGNOSIS — G4733 Obstructive sleep apnea (adult) (pediatric): Secondary | ICD-10-CM | POA: Insufficient documentation

## 2012-12-30 NOTE — Progress Notes (Signed)
Patient needs referral to respiratory therapist Has problem with c pap machine

## 2012-12-30 NOTE — Patient Instructions (Signed)

## 2012-12-30 NOTE — Progress Notes (Signed)
Patient ID: Samuel Irwin, male   DOB: 1960/07/13, 52 y.o.   MRN: [id number]  CC: follow up  HPI: 52 year old male with past medical history of narcolepsy, weakness and sleep apnea who presented to clinic for followup and the need for respiratory therapy referral. Patient was to have sleep study done and things he may need to use CPAP. He used the CPAP before. No chest pain or shortness of breath.  No Known Allergies Past Medical History  Diagnosis Date  . GERD (gastroesophageal reflux disease)   . Umbilical hernia   . Constipation   . Cough   . Wheezing   . Weakness   . Generalized headaches   . Heart murmur   . Shortness of breath     occ exersion  . Pneumonia     hx  . Sinusitis   . H/O hiatal hernia    Current Outpatient Prescriptions on File Prior to Visit  Medication Sig Dispense Refill  . acyclovir (ZOVIRAX) 400 MG tablet Take 400 mg by mouth 2 (two) times daily.      Marland Kitchen aluminum chloride (DRYSOL) 20 % external solution Apply topically at bedtime.  38 mL  0  . Multiple Vitamin (MULTIVITAMIN WITH MINERALS) TABS Take 1 tablet by mouth daily.      . naproxen sodium (ANAPROX) 220 MG tablet Take 220 mg by mouth daily as needed. For pain      . oxyCODONE (ROXICODONE) 5 MG immediate release tablet Take 1-2 tablets (5-10 mg total) by mouth every 4 (four) hours as needed for pain.  45 tablet  0  . Tetrahydroz-Dextran-PEG-Povid (VISINE ADVANCED RELIEF OP) Place 2 drops into both eyes daily as needed (q). For eye dryness       No current facility-administered medications on file prior to visit.   Family History  Problem Relation Age of Onset  . Cancer Father     lung   History   Social History  . Marital Status: Single    Spouse Name: N/A    Number of Children: N/A  . Years of Education: N/A   Occupational History  . Not on file.   Social History Main Topics  . Smoking status: Current Every Day Smoker -- 0.50 packs/day for 15 years    Types: Cigarettes  . Smokeless  tobacco: Not on file     Comment: smoke when drink  . Alcohol Use: 7.2 oz/week    12 Cans of beer per week  . Drug Use: No  . Sexual Activity: Not on file   Other Topics Concern  . Not on file   Social History Narrative  . No narrative on file    Review of Systems  Constitutional: Negative for fever, chills, diaphoresis, activity change, appetite change and fatigue.  HENT: Negative for ear pain, nosebleeds, congestion, facial swelling, rhinorrhea, neck pain, neck stiffness and ear discharge.   Eyes: Negative for pain, discharge, redness, itching and visual disturbance.  Respiratory: Negative for cough, choking, chest tightness, shortness of breath, wheezing and stridor.   Cardiovascular: Negative for chest pain, palpitations and leg swelling.  Gastrointestinal: Negative for abdominal distention.  Genitourinary: Negative for dysuria, urgency, frequency, hematuria, flank pain, decreased urine volume, difficulty urinating and dyspareunia.  Musculoskeletal: Negative for back pain, joint swelling, arthralgias and gait problem.  Neurological: Negative for dizziness, tremors, seizures, syncope, facial asymmetry, speech difficulty, weakness, light-headedness, numbness and headaches.  Hematological: Negative for adenopathy. Does not bruise/bleed easily.  Psychiatric/Behavioral: Negative for hallucinations, behavioral problems, confusion,  dysphoric mood, decreased concentration and agitation.    Objective:   Filed Vitals:   12/30/12 1236  BP: 146/96  Pulse: 74  Temp: 98 F (36.7 C)  Resp: 16    Physical Exam  Constitutional: Appears well-developed and well-nourished. No distress.  HENT: Normocephalic. External right and left ear normal. Oropharynx is clear and moist.  Eyes: Conjunctivae and EOM are normal. PERRLA, no scleral icterus.  Neck: Normal ROM. Neck supple. No JVD. No tracheal deviation. No thyromegaly.  CVS: RRR, S1/S2 +, no murmurs, no gallops, no carotid bruit.   Pulmonary: Effort and breath sounds normal, no stridor, rhonchi, wheezes, rales.  Abdominal: Soft. BS +,  no distension, tenderness, rebound or guarding.  Musculoskeletal: Normal range of motion. No edema and no tenderness.  Lymphadenopathy: No lymphadenopathy noted, cervical, inguinal. Neuro: Alert. Normal reflexes, muscle tone coordination. No cranial nerve deficit. Skin: Skin is warm and dry. No rash noted. Not diaphoretic. No erythema. No pallor.  Psychiatric: Normal mood and affect. Behavior, judgment, thought content normal.   Lab Results  Component Value Date   WBC 8.7 04/06/2012   HGB 18.1* 04/06/2012   HCT 51.8 04/06/2012   MCV 91.0 04/06/2012   PLT 294 04/06/2012   Lab Results  Component Value Date   CREATININE 1.27 04/06/2012   BUN 18 04/06/2012   NA 142 04/06/2012   K 4.1 04/06/2012   CL 106 04/06/2012   CO2 25 04/06/2012    Lab Results  Component Value Date   HGBA1C 5.5 03/20/2012   Lipid Panel     Component Value Date/Time   CHOL 201* 03/20/2012 1850   TRIG 422* 03/20/2012 1850   HDL 45 03/20/2012 1850   CHOLHDL 4.5 03/20/2012 1850   VLDL UNABLE TO CALCULATE IF TRIGLYCERIDE OVER 400 mg/dL 9/60/4540 9811   LDLCALC UNABLE TO CALCULATE IF TRIGLYCERIDE OVER 400 mg/dL 11/09/7827 5621       Assessment and plan:   Patient Active Problem List   Diagnosis Date Noted  . OSA (obstructive sleep apnea) 08/24/2012    Priority: Medium - referral to resp therapy provided

## 2013-03-01 ENCOUNTER — Telehealth: Payer: Self-pay | Admitting: *Deleted

## 2013-03-01 NOTE — Telephone Encounter (Signed)
Patient stated that he is currently still working through the process of getting his cpap set appropriately for his needs.  Patient also shared that he has been making some progress with getting work but still feels a little anxious because he has not made significant progress with treating his sleep apnea.  LCSW provided some encouragement for patient to continue through the process by building on his previous progress.  LCSW did provide information about smoking cessation and encouraged patient to stop smoking and drinking due the impact on the body which can worsen sleep apnea.  LCSW informed patient of his appointment with pulmonary on tomorrow for further support and follow up.  Christene Lye MSW, LCSW Duration - 30 min

## 2013-03-02 ENCOUNTER — Encounter: Payer: Self-pay | Admitting: Pulmonary Disease

## 2013-03-02 ENCOUNTER — Ambulatory Visit (INDEPENDENT_AMBULATORY_CARE_PROVIDER_SITE_OTHER): Payer: Self-pay | Admitting: Pulmonary Disease

## 2013-03-02 ENCOUNTER — Telehealth: Payer: Self-pay | Admitting: Pulmonary Disease

## 2013-03-02 VITALS — BP 122/88 | HR 97 | Temp 98.9°F | Ht 68.0 in | Wt 250.0 lb

## 2013-03-02 DIAGNOSIS — G4733 Obstructive sleep apnea (adult) (pediatric): Secondary | ICD-10-CM

## 2013-03-02 NOTE — Patient Instructions (Signed)
Will decrease your cpap pressure to 10, to allow you to adapt to the pressure over time.  Will see you back in 6 weeks to see how things are going. If you are tolerating the pressure of 10, will gradually increase to your optimal pressure of 16. Work on weight loss.

## 2013-03-02 NOTE — Progress Notes (Signed)
Subjective:    Patient ID: Samuel Irwin, male    DOB: Sep 21, 1960, 53 y.o.   MRN: [id number]  HPI The patient is a 53 year old male who comes in today as a self-referral for management of obstructive sleep apnea. He underwent a sleep study in July of this year which showed very severe OSA, with an AHI of 87 events per hour. He was started on CPAP as part of a split night study, with his pressure increased as high as 16 cm of water. The patient was started on CPAP at an unknown pressure, but he tells me it is too high and difficult to tolerate. Because of this, he has not been compliant with his device. He feels that his mask fits extremely well, and does not have excessive leaking. Prior to starting on CPAP, the patient had difficulties with snoring and witnessed apneas, as well as nonrestorative sleep and daytime sleepiness. The patient states that his weight is up 20 pounds over the last 2 years, and his Epworth score today is abnormal at 14.   Sleep Questionnaire What time do you typically go to bed?( Between what hours) 1a-2a 1a-2a at 1201 on 03/02/13 by Virl Cagey, CMA How long does it take you to fall asleep? 3-77mins 3-12mins at 1201 on 03/02/13 by Virl Cagey, CMA How many times during the night do you wake up? 3 3 at 1201 on 03/02/13 by Virl Cagey, CMA What time do you get out of bed to start your day? No Value 830-9am at 1201 on 03/02/13 by Virl Cagey, CMA Do you drive or operate heavy machinery in your occupation? No No at 1201 on 03/02/13 by Virl Cagey, CMA How much has your weight changed (up or down) over the past two years? (In pounds) 20 lb (9.072 kg) 20 lb (9.072 kg) at 1201 on 03/02/13 by Virl Cagey, CMA Have you ever had a sleep study before? Yes Yes at 1201 on 03/02/13 by Virl Cagey, CMA If yes, [facility] at [facility]01 on 03/02/13 by Virl Cagey, CMA If yes, date of study? 08/2012 08/2012 at 1201 on 03/02/13 by Virl Cagey, CMA Do you currently use CPAP? Yes Yes at 1201 on 03/02/13 by Virl Cagey, CMA If so, what pressure? unsure unsure at 1201 on 03/02/13 by Virl Cagey, CMA Do you wear oxygen at any time? No No at 1201 on 03/02/13 by Virl Cagey, CMA   Review of Systems  Constitutional: Negative for fever and unexpected weight change.  HENT: Positive for congestion. Negative for dental problem, ear pain, nosebleeds, postnasal drip, rhinorrhea, sinus pressure, sneezing, sore throat and trouble swallowing.   Eyes: Negative for redness and itching.  Respiratory: Positive for cough and shortness of breath ( during night). Negative for chest tightness and wheezing.   Cardiovascular: Positive for chest pain ( x 3 days). Negative for palpitations and leg swelling.  Gastrointestinal: Negative for nausea and vomiting.  Genitourinary: Negative for dysuria.  Musculoskeletal: Negative for joint swelling.  Skin: Negative for rash.  Neurological: Negative for headaches.  Hematological: Does not bruise/bleed easily.  Psychiatric/Behavioral: Positive for dysphoric mood. The patient is not nervous/anxious.        Objective:   Physical Exam Constitutional:  Obese male,  no acute distress  HENT:  Nares patent without discharge, but large turbinates.  Oropharynx without exudate, palate and uvula are thick and elongated.  Eyes:  Perrla, eomi, no  scleral icterus  Neck:  No JVD, no TMG  Cardiovascular:  Normal rate, regular rhythm, no rubs or gallops.  No murmurs        Intact distal pulses  Pulmonary :  Normal breath sounds, no stridor or respiratory distress   No rales, rhonchi, or wheezing  Abdominal:  Soft, nondistended, bowel sounds present.  No tenderness noted.   Musculoskeletal:  No lower extremity edema noted.  Lymph Nodes:  No cervical lymphadenopathy noted  Skin:  No cyanosis noted  Neurologic:  Alert, appropriate, moves all 4 extremities without obvious deficit.          Assessment & Plan:

## 2013-03-02 NOTE — Telephone Encounter (Signed)
lmomtcb x1 for pt 

## 2013-03-02 NOTE — Assessment & Plan Note (Signed)
The patient has a history of very severe obstructive sleep apnea, and has been started on CPAP. He is having great difficulty tolerating the pressure, and I suspect he was started on his optimal pressure of 16 cm of water from the very beginning. I have explained this is a high pressure, and typically would start with a lower pressure to allow for desensitization. Could then slowly increase his pressure to the optimal level over time. I will have his device set at 10 cm of water, and see him back in about 6 weeks. If doing well, will gradually increase his pressure to his optimal level. I have also discussed the pathophysiology of sleep apnea with him, including its impact to his cardiovascular health. I've encouraged him to work aggressively on weight loss.

## 2013-03-03 NOTE — Telephone Encounter (Signed)
[email or web address]. Tamico Mundo, CMA  

## 2013-03-04 NOTE — Telephone Encounter (Signed)
Lmtcbx3. Samuel Irwin, CMA  

## 2013-03-05 NOTE — Telephone Encounter (Signed)
Per protocol I will sign off on message and await a call back. Rumi Taras, CMA  

## 2013-03-18 ENCOUNTER — Ambulatory Visit: Payer: Self-pay | Admitting: *Deleted

## 2013-03-18 ENCOUNTER — Ambulatory Visit: Payer: No Typology Code available for payment source | Attending: Internal Medicine

## 2013-03-18 NOTE — Progress Notes (Signed)
LCSW met with patient in order to update challenges with CPAP and provide encouragement and direction with maintaining healthcare and pursuing work.  Patient reported that his condition is improving and that he has some nervousness as he is adjusting to using the machine but he has seen some overall improvement.  LCSW encouraged patient to follow up with Pulmonologist if there are challenges that arise since the adjustment.    LCSW will continue to follow as necessary.    Christene Lye MSW, LCSW Duration 20 min

## 2013-03-30 ENCOUNTER — Telehealth: Payer: Self-pay | Admitting: Pulmonary Disease

## 2013-03-30 DIAGNOSIS — G4733 Obstructive sleep apnea (adult) (pediatric): Secondary | ICD-10-CM

## 2013-03-30 NOTE — Telephone Encounter (Signed)
[email or web address].Carrera Kiesel, CMA  

## 2013-03-31 NOTE — Telephone Encounter (Signed)
[email or web address]. Monika Chestang, CMA  

## 2013-04-01 NOTE — Telephone Encounter (Signed)
LMTCBx3. Jennifer Castillo, CMA  

## 2013-04-02 NOTE — Telephone Encounter (Signed)
Let pt know that "suffocation" is usually not a mask issue, but not getting enough air because of too little pressure.  As bad as their sleep apnea is, most likely needs more pressure.  Please send order to dme to change cpap machine to auto setting at 7-20cm asap.  Pt to call us if not doing well.

## 2013-04-02 NOTE — Telephone Encounter (Signed)
LMTCBx1 to advise pt,  order placed. Norman Bing, CMA

## 2013-04-02 NOTE — Telephone Encounter (Signed)
I spoke with the pt and he states that he currently has a full face mask and he feels like he is suffocating with it. He states he feels like he is not getting enough air and feels it is due to the mask. He wants to know can he try another type of mask. Please advise. Hereford Bing, CMA No Known Allergies

## 2013-04-05 NOTE — Telephone Encounter (Signed)
LMTCB on home and cell #. Iris Hairston, CMA  

## 2013-04-07 NOTE — Telephone Encounter (Signed)
Order placed and pt aware. [place] Bing, CMA

## 2013-04-13 ENCOUNTER — Ambulatory Visit: Payer: Self-pay | Admitting: Pulmonary Disease

## 2013-04-19 ENCOUNTER — Telehealth: Payer: Self-pay | Admitting: Pulmonary Disease

## 2013-04-19 NOTE — Telephone Encounter (Signed)
Called, spoke with pt.  He called on 03/30/13 with the following recs from [place]:  Kathee Delton, MD at 04/02/2013 9:20 AM     Status: Signed        Let pt know that "suffocation" is usually not a mask issue, but not getting enough air because of too little pressure. As bad as their sleep apnea is, most likely needs more pressure. Please send order to dme to change cpap machine to auto setting at 7-20cm asap. Pt to call us if not doing well.    ------  Pt states when he called he was on a full face mask.  Approx 2 wks ago, he was changed to a nasal mask.  He still feels like he isn't getting enough air and is waking up with this feeling.  He thinks it is some better from full face mask but symptoms are still there.  Pt reports he believes pressure needs to be increased.  Per pt, his machine is set on a set pressure of 7 and isn't on auto 7-20 as recommended above.   Called [facility], spoke with [name].  She will check on this to see what pressure pt is on - fixed or auto and will call back.

## 2013-04-20 NOTE — Telephone Encounter (Signed)
Samuel Irwin is calling back pt is on an auto setting 7-20 due to come in for a download on 02/27

## 2013-04-20 NOTE — Telephone Encounter (Signed)
Spoke with patient-he states he understands and no questions or concerns.

## 2013-04-20 NOTE — Telephone Encounter (Signed)
Pt is having a hard time understanding this. Pt is aware to go to [facility] and they will take care of him. Then come to our office on Friday.

## 2013-04-20 NOTE — Telephone Encounter (Signed)
Pt will need to get download from [facility] on Friday 04-23-13 before [facility] can suggest any increase to pressure.   LMTCB

## 2013-04-20 NOTE — Telephone Encounter (Signed)
What am I supposed to do with this

## 2013-04-23 ENCOUNTER — Ambulatory Visit: Payer: Self-pay | Admitting: Pulmonary Disease

## 2013-04-26 ENCOUNTER — Ambulatory Visit (INDEPENDENT_AMBULATORY_CARE_PROVIDER_SITE_OTHER): Payer: No Typology Code available for payment source | Admitting: Pulmonary Disease

## 2013-04-26 ENCOUNTER — Encounter: Payer: Self-pay | Admitting: Pulmonary Disease

## 2013-04-26 ENCOUNTER — Ambulatory Visit: Payer: Self-pay | Admitting: Internal Medicine

## 2013-04-26 VITALS — BP 140/90 | HR 80 | Temp 98.1°F | Ht 68.0 in | Wt 261.6 lb

## 2013-04-26 DIAGNOSIS — G4733 Obstructive sleep apnea (adult) (pediatric): Secondary | ICD-10-CM

## 2013-04-26 NOTE — Assessment & Plan Note (Signed)
The patient is trying to wear CPAP compliantly, but is having issues with his pressure setting. His sleep apnea was not adequately controlled on 10 cm, and therefore he was changed to automatic at 7-20 cm. He felt he was not getting enough air on that setting, and was removing the mask at night because of a feeling of suffocation. He went back to his setting of 10 cm. At this point, I would like to try him on auto again, with a starting pressure of 10 cm and a maximum of 20 cm. I've also encouraged him to work aggressively on weight loss. We'll get a download on his new setting after 2 or 3 weeks, to see how things are progressing.

## 2013-04-26 NOTE — Progress Notes (Signed)
   Subjective:    Patient ID: Samuel Irwin, male    DOB: [birth year], 53 y.o.   MRN: [id number]  HPI The patient comes in today for followup of his known severe obstructive sleep apnea. His CPAP machine was changed to the automatic setting at the last visit, however the patient could not tolerate pressure as low as 7 cm. He went back to his old machine at 10 cm, but still had issues because of breakthrough apnea. He denies any issues with his current CPAP mask. He continues to have poor sleep efficiency and daytime sleepiness while wearing CPAP at 10 cm.   Review of Systems  Constitutional: Negative for fever and unexpected weight change.  HENT: Negative for congestion, dental problem, ear pain, nosebleeds, postnasal drip, rhinorrhea, sinus pressure, sneezing, sore throat and trouble swallowing.   Eyes: Negative for redness and itching.  Respiratory: Negative for cough, chest tightness, shortness of breath and wheezing.   Cardiovascular: Negative for palpitations and leg swelling.  Gastrointestinal: Negative for nausea and vomiting.  Genitourinary: Negative for dysuria.  Musculoskeletal: Negative for joint swelling.  Skin: Negative for rash.  Neurological: Negative for headaches.  Hematological: Does not bruise/bleed easily.  Psychiatric/Behavioral: Negative for dysphoric mood. The patient is not nervous/anxious.        Objective:   Physical Exam Obese male in no acute distress Nose without purulence or discharge noted No skin breakdown or pressure necrosis from the CPAP mask Neck without lymphadenopathy or thyromegaly Chest clear to auscultation Cardiac exam with regular rate and rhythm Lower extremities with mild edema, no cyanosis Alert and oriented, moves all 4 extremities       Assessment & Plan:

## 2013-04-26 NOTE — Patient Instructions (Signed)
Will have your cpap machine put on auto at 10-20cm of pressure.  Please let us know if this is not doing well. Work on weight loss.  followup with me again in 64mos if doing well.

## 2013-05-17 ENCOUNTER — Ambulatory Visit: Payer: No Typology Code available for payment source | Admitting: Internal Medicine

## 2013-05-18 ENCOUNTER — Ambulatory Visit: Payer: No Typology Code available for payment source | Attending: Internal Medicine | Admitting: Internal Medicine

## 2013-05-18 ENCOUNTER — Encounter: Payer: Self-pay | Admitting: Internal Medicine

## 2013-05-18 VITALS — BP 146/91 | HR 76 | Temp 98.5°F | Resp 16 | Wt 256.2 lb

## 2013-05-18 DIAGNOSIS — Z008 Encounter for other general examination: Secondary | ICD-10-CM | POA: Insufficient documentation

## 2013-05-18 DIAGNOSIS — E8881 Metabolic syndrome: Secondary | ICD-10-CM | POA: Insufficient documentation

## 2013-05-18 DIAGNOSIS — Z79899 Other long term (current) drug therapy: Secondary | ICD-10-CM | POA: Insufficient documentation

## 2013-05-18 DIAGNOSIS — I10 Essential (primary) hypertension: Secondary | ICD-10-CM | POA: Insufficient documentation

## 2013-05-18 DIAGNOSIS — K219 Gastro-esophageal reflux disease without esophagitis: Secondary | ICD-10-CM | POA: Insufficient documentation

## 2013-05-18 DIAGNOSIS — E119 Type 2 diabetes mellitus without complications: Secondary | ICD-10-CM

## 2013-05-18 DIAGNOSIS — G4733 Obstructive sleep apnea (adult) (pediatric): Secondary | ICD-10-CM | POA: Insufficient documentation

## 2013-05-18 DIAGNOSIS — E781 Pure hyperglyceridemia: Secondary | ICD-10-CM | POA: Insufficient documentation

## 2013-05-18 DIAGNOSIS — E785 Hyperlipidemia, unspecified: Secondary | ICD-10-CM | POA: Insufficient documentation

## 2013-05-18 LAB — CBC WITH DIFFERENTIAL/PLATELET
Basophils Absolute: 0.1 10*3/uL (ref 0.0–0.1)
Basophils Relative: 1 % (ref 0–1)
EOS ABS: 0.3 10*3/uL (ref 0.0–0.7)
Eosinophils Relative: 4 % (ref 0–5)
HCT: 47.2 % (ref 39.0–52.0)
HEMOGLOBIN: 16.5 g/dL (ref 13.0–17.0)
LYMPHS ABS: 2.7 10*3/uL (ref 0.7–4.0)
LYMPHS PCT: 37 % (ref 12–46)
MCH: 30.4 pg (ref 26.0–34.0)
MCHC: 35 g/dL (ref 30.0–36.0)
MCV: 87.1 fL (ref 78.0–100.0)
MONOS PCT: 8 % (ref 3–12)
Monocytes Absolute: 0.6 10*3/uL (ref 0.1–1.0)
NEUTROS ABS: 3.6 10*3/uL (ref 1.7–7.7)
NEUTROS PCT: 50 % (ref 43–77)
PLATELETS: 335 10*3/uL (ref 150–400)
RBC: 5.42 MIL/uL (ref 4.22–5.81)
RDW: 15 % (ref 11.5–15.5)
WBC: 7.2 10*3/uL (ref 4.0–10.5)

## 2013-05-18 LAB — COMPLETE METABOLIC PANEL WITH GFR
ALBUMIN: 4.5 g/dL (ref 3.5–5.2)
ALT: 24 U/L (ref 0–53)
AST: 16 U/L (ref 0–37)
Alkaline Phosphatase: 66 U/L (ref 39–117)
BILIRUBIN TOTAL: 0.5 mg/dL (ref 0.2–1.2)
BUN: 14 mg/dL (ref 6–23)
CALCIUM: 9.8 mg/dL (ref 8.4–10.5)
CHLORIDE: 102 meq/L (ref 96–112)
CO2: 27 meq/L (ref 19–32)
Creat: 1.31 mg/dL (ref 0.50–1.35)
GFR, EST AFRICAN AMERICAN: 72 mL/min
GFR, EST NON AFRICAN AMERICAN: 63 mL/min
GLUCOSE: 115 mg/dL — AB (ref 70–99)
POTASSIUM: 4.2 meq/L (ref 3.5–5.3)
SODIUM: 140 meq/L (ref 135–145)
TOTAL PROTEIN: 7.4 g/dL (ref 6.0–8.3)

## 2013-05-18 LAB — LIPID PANEL
Cholesterol: 213 mg/dL — ABNORMAL HIGH (ref 0–200)
HDL: 53 mg/dL (ref >39)
LDL CALC: 111 mg/dL — AB (ref 0–99)
Total CHOL/HDL Ratio: 4 Ratio
Triglycerides: 243 mg/dL — ABNORMAL HIGH (ref <150)
VLDL: 49 mg/dL — ABNORMAL HIGH (ref 0–40)

## 2013-05-18 LAB — POCT GLYCOSYLATED HEMOGLOBIN (HGB A1C): HEMOGLOBIN A1C: 5.3

## 2013-05-18 LAB — TSH: TSH: 1.429 u[IU]/mL (ref 0.350–4.500)

## 2013-05-18 MED ORDER — HYDROCHLOROTHIAZIDE 25 MG PO TABS: 25.0000 mg | ORAL_TABLET | Freq: Every day | ORAL | Status: DC

## 2013-05-18 MED ORDER — TADALAFIL 20 MG PO TABS: 10.0000 mg | ORAL_TABLET | ORAL | Status: DC | PRN

## 2013-05-18 NOTE — Progress Notes (Signed)
Patient ID: Samuel Irwin, male   DOB: Aug 22, 1960, 53 y.o.   MRN: [id number]    CC:  HPI: 53 year old male here to get a routine physical. The patient is morbidly obese. He has a history of sleep apnea and currently has a CPAP at home but finds it very difficult to keep it in place and finds finds it to be very claustrophobic. He also admits to drinking twice a week, mostly beer and is a heavy drinker. He has also been found to be hypertensive with systolic blood pressure ranging in the 140s to 150s for office visits. His last lipid profile showed triglycerides greater than 400. The patient has been reluctant to get started on treatment for dyslipidemia and hypertension. He is requesting a colonoscopy, also requesting prostrate  to be evaluated.    No Known Allergies Past Medical History  Diagnosis Date  . GERD (gastroesophageal reflux disease)   . Umbilical hernia   . Constipation   . Cough   . Wheezing   . Weakness   . Generalized headaches   . Heart murmur   . Shortness of breath     occ exersion  . Pneumonia     hx  . Sinusitis   . H/O hiatal hernia    Current Outpatient Prescriptions on File Prior to Visit  Medication Sig Dispense Refill  . acyclovir (ZOVIRAX) 400 MG tablet Take 400 mg by mouth 2 (two) times daily.      . Multiple Vitamin (MULTIVITAMIN WITH MINERALS) TABS Take 1 tablet by mouth daily.      Sallye Lat (VISINE ADVANCED RELIEF OP) Place 2 drops into both eyes daily as needed (q). For eye dryness       No current facility-administered medications on file prior to visit.   Family History  Problem Relation Age of Onset  . Cancer Father     lung   History   Social History  . Marital Status: Single    Spouse Name: N/A    Number of Children: N/A  . Years of Education: N/A   Occupational History  . unemployed    Social History Main Topics  . Smoking status: Former Smoker -- 15 years    Types: Cigarettes    Quit date:  02/26/2013  . Smokeless tobacco: Not on file     Comment: Pt reports quit 02/26/13-- only smoked with alcohol  . Alcohol Use: No     Comment: stopped Feb 26, 2013  . Drug Use: No  . Sexual Activity: Not on file   Other Topics Concern  . Not on file   Social History Narrative  . No narrative on file    Review of Systems  Constitutional: Negative for fever, chills, diaphoresis, activity change, appetite change and fatigue.  HENT: Negative for ear pain, nosebleeds, congestion, facial swelling, rhinorrhea, neck pain, neck stiffness and ear discharge.   Eyes: Negative for pain, discharge, redness, itching and visual disturbance.  Respiratory: Negative for cough, choking, chest tightness, shortness of breath, wheezing and stridor.   Cardiovascular: Negative for chest pain, palpitations and leg swelling.  Gastrointestinal: Negative for abdominal distention.  Genitourinary: Negative for dysuria, urgency, frequency, hematuria, flank pain, decreased urine volume, difficulty urinating and dyspareunia.  Musculoskeletal: Negative for back pain, joint swelling, arthralgias and gait problem.  Neurological: Negative for dizziness, tremors, seizures, syncope, facial asymmetry, speech difficulty, weakness, light-headedness, numbness and headaches.  Hematological: Negative for adenopathy. Does not bruise/bleed easily.  Psychiatric/Behavioral: Negative for hallucinations, behavioral problems, confusion,  dysphoric mood, decreased concentration and agitation.    Objective:   Filed Vitals:   05/18/13 1538  BP: 146/91  Pulse:   Temp:   Resp:     Physical Exam  Constitutional: Appears well-developed and well-nourished. No distress.  HENT: Normocephalic. External right and left ear normal. Oropharynx is clear and moist.  Eyes: Conjunctivae and EOM are normal. PERRLA, no scleral icterus.  Neck: Normal ROM. Neck supple. No JVD. No tracheal deviation. No thyromegaly.  CVS: RRR, S1/S2 +, no murmurs, no  gallops, no carotid bruit.  Pulmonary: Effort and breath sounds normal, no stridor, rhonchi, wheezes, rales.  Abdominal: Soft. BS +,  no distension, tenderness, rebound or guarding.  Musculoskeletal: Normal range of motion. No edema and no tenderness.  Lymphadenopathy: No lymphadenopathy noted, cervical, inguinal. Neuro: Alert. Normal reflexes, muscle tone coordination. No cranial nerve deficit. Skin: Skin is warm and dry. No rash noted. Not diaphoretic. No erythema. No pallor.  Psychiatric: Normal mood and affect. Behavior, judgment, thought content normal.   Lab Results  Component Value Date   WBC 8.7 04/06/2012   HGB 18.1* 04/06/2012   HCT 51.8 04/06/2012   MCV 91.0 04/06/2012   PLT 294 04/06/2012   Lab Results  Component Value Date   CREATININE 1.27 04/06/2012   BUN 18 04/06/2012   NA 142 04/06/2012   K 4.1 04/06/2012   CL 106 04/06/2012   CO2 25 04/06/2012    Lab Results  Component Value Date   HGBA1C 5.5 03/20/2012   Lipid Panel     Component Value Date/Time   CHOL 201* 03/20/2012 1850   TRIG 422* 03/20/2012 1850   HDL 45 03/20/2012 1850   CHOLHDL 4.5 03/20/2012 1850   VLDL UNABLE TO CALCULATE IF TRIGLYCERIDE OVER 400 mg/dL 03/20/2012 1850   LDLCALC UNABLE TO CALCULATE IF TRIGLYCERIDE OVER 400 mg/dL 03/20/2012 1850       Assessment and plan:   Patient Active Problem List   Diagnosis Date Noted  . OSA (obstructive sleep apnea) 08/24/2012  . Metabolic syndrome 19/41/7408       Obstructive sleep apnea Patient will discuss difficulty with using his CPAP with his pulmonologist May benefit from a nasal mask   Metabolic syndrome Offered treatment for hypertension today Provided him with a prescription for hydrochlorothiazide 25 mg Patient wants to join the gym and try to lose weight Her blood pressure not under control, or stays above 130/90, he will pick up this prescription  Hypertriglyceridemia Will repeat lipid panel today  Gastroenterology referral provided for a  routine colonoscopy We'll also check a PSA if elevated patient will be referred to urology  The patient was given clear instructions to go to ER or return to medical center if symptoms don't improve, worsen or new problems develop. The patient verbalized understanding. The patient was told to call to get any lab results if not heard anything in the next week.

## 2013-05-18 NOTE — Progress Notes (Signed)
Patient here for follow up Would like to have his prostate check Needs referral to GI for colonoscopy

## 2013-05-19 LAB — VITAMIN D 25 HYDROXY (VIT D DEFICIENCY, FRACTURES): VIT D 25 HYDROXY: 12 ng/mL — AB (ref 30–89)

## 2013-05-19 LAB — PSA: PSA: 0.32 ng/mL (ref <=4.00)

## 2013-05-19 MED ORDER — ERGOCALCIFEROL 1.25 MG (50000 UT) PO CAPS: 50000.0000 [IU] | ORAL_CAPSULE | ORAL | Status: DC

## 2013-05-19 NOTE — Addendum Note (Signed)
Addended by: Allyson Sabal MD, Ascencion Dike on: 05/19/2013 10:00 AM   Modules accepted: Orders

## 2013-05-25 ENCOUNTER — Telehealth: Payer: Self-pay | Admitting: Emergency Medicine

## 2013-05-25 NOTE — Telephone Encounter (Signed)
Message copied by Ricci Barker on Tue May 25, 2013  4:00 PM ------      Message from: Allyson Sabal MD, [facility]      Created: Wed May 19, 2013 10:01 AM       Notify patient back triglycerides are elevated at 243. Patient is encouraged to start moderate exercise and lose weight      Vitamin D level is 12, [facility]. 50,000 units weekly      PSA is normal ------

## 2013-05-25 NOTE — Telephone Encounter (Signed)
Left message for pt to call clinic when message received

## 2013-06-21 ENCOUNTER — Encounter: Payer: Self-pay | Admitting: Family Medicine

## 2013-07-05 ENCOUNTER — Ambulatory Visit: Payer: No Typology Code available for payment source | Attending: Internal Medicine | Admitting: *Deleted

## 2013-07-05 VITALS — BP 143/97 | HR 79 | Resp 18 | Ht 68.0 in | Wt 252.6 lb

## 2013-07-05 DIAGNOSIS — I10 Essential (primary) hypertension: Secondary | ICD-10-CM

## 2013-07-05 NOTE — Patient Instructions (Signed)
Hypertension Hypertension is another name for high blood pressure. High blood pressure may mean that your heart needs to work harder to pump blood. Blood pressure consists of two numbers, which includes a higher number over a lower number (example: 110/72). HOME CARE   Make lifestyle changes as told by your doctor. This may include weight loss and exercise.  Take your blood pressure medicine every day.  Limit how much salt you use.  Stop smoking if you smoke.  Do not use drugs.  Talk to your doctor if you are using decongestants or birth control pills. These medicines might make blood pressure higher.  Females should not drink more than 1 alcoholic drink per day. Males should not drink more than 2 alcoholic drinks per day.  See your doctor as told. GET HELP RIGHT AWAY IF:   You have a blood pressure reading with a top number of 180 or higher.  You get a very bad headache.  You get blurred or changing vision.  You feel confused.  You feel weak, numb, or faint.  You get chest or belly (abdominal) pain.  You throw up (vomit).  You cannot breathe very well. MAKE SURE YOU:   Understand these instructions.  Will watch your condition.  Will get help right away if you are not doing well or get worse. Document Released: 07/31/2007 Document Revised: 05/06/2011 Document Reviewed: 07/31/2007 [employer] Patient Information 2014 [employer].  

## 2013-07-05 NOTE — Progress Notes (Signed)
Patient here for blood pressure recheck. Patient states he has not taken his BP medication in two weeks because he does not like taking pills. Informed patient of the importance of taking blood pressure medication, eating correctly, and exercising. Patient reports he does know what he needs to do.

## 2013-07-27 ENCOUNTER — Telehealth: Payer: Self-pay | Admitting: Pulmonary Disease

## 2013-07-27 DIAGNOSIS — G4733 Obstructive sleep apnea (adult) (pediatric): Secondary | ICD-10-CM

## 2013-07-27 NOTE — Telephone Encounter (Signed)
LMTCB

## 2013-07-28 NOTE — Telephone Encounter (Signed)
LMTCBx2. Jennifer Castillo, CMA  

## 2013-07-29 NOTE — Telephone Encounter (Signed)
lmtcb x3 

## 2013-07-30 NOTE — Telephone Encounter (Signed)
Spoke with the pt  He states that his CPAP pressure is not high enough  He states that when he uses CPAP, he feels SOB and has to take the mask off  Please advise, thanks

## 2013-07-30 NOTE — Telephone Encounter (Signed)
Pt returned call

## 2013-07-30 NOTE — Telephone Encounter (Signed)
Per 3.2.15 ov w/ KC: OSA (obstructive sleep apnea) - Kathee Delton, MD at 04/26/2013 10:12 AM     The patient is trying to wear CPAP compliantly, but is having issues with his pressure setting. His sleep apnea was not adequately controlled on 10 cm, and therefore he was changed to automatic at 7-20 cm. He felt he was not getting enough air on that setting, and was removing the mask at night because of a feeling of suffocation. He went back to his setting of 10 cm. At this point, I would like to try him on auto again, with a starting pressure of 10 cm and a maximum of 20 cm. I've also encouraged him to work aggressively on weight loss. We'll get a download on his new setting after 2 or 3 weeks, to see how things are progressing.   Patient Instructions     Will have your cpap machine put on auto at 10-20cm of pressure. Please let us know if this is not doing well.  Work on weight loss.  followup with me again in 73mos if doing well  +++++++++++++++++++++++++++++++++++++++++++++++++++++++++++++++++ ATC pt at home x1 - line rang numerous times with no answer and no option to LM.  WCB.

## 2013-07-30 NOTE — Telephone Encounter (Signed)
Patient returning call.  Please call home phone.  [phone]

## 2013-07-30 NOTE — Telephone Encounter (Signed)
Please get download on airview for last 4 weeks, and let pt know will put him on a fixed pressure that should solve this issue

## 2013-08-02 NOTE — Telephone Encounter (Signed)
lmomtcb x1 for melissa 

## 2013-08-02 NOTE — Telephone Encounter (Signed)
[name] returned call  °

## 2013-08-02 NOTE — Telephone Encounter (Signed)
Looked in [place] and pt is not enrolled it looks like. Called [name] and LMTCB x1

## 2013-08-03 NOTE — Telephone Encounter (Signed)
Spoke with [name]  She states that the pt's CPAP does not have a modem and that is why no airview  She states needs order for CPAP download  Order sent to [facility]

## 2013-08-11 ENCOUNTER — Telehealth: Payer: Self-pay | Admitting: Pulmonary Disease

## 2013-08-11 NOTE — Telephone Encounter (Signed)
lmomtcb x1 for melissa 

## 2013-08-12 NOTE — Telephone Encounter (Signed)
Spoke to [place] when returned call. She stated when her respiratory staff called the pt to get a download the pt stated he has not been wearing the CPAP and was confused on why a download was needed.   LMTCB for pt.

## 2013-08-12 NOTE — Telephone Encounter (Signed)
Staff message sent to [facility] to contact our office to discuss this patient.

## 2013-08-13 NOTE — Telephone Encounter (Signed)
lmtcb x2 for pt. 

## 2013-08-16 NOTE — Telephone Encounter (Signed)
lmtcb x3 

## 2013-08-17 NOTE — Telephone Encounter (Signed)
Spoke with pt he states that he had turned in a machine for download about a month ago and never heard anything.  I spoke with Gwinda Passe with [facility] and she states the last download they have on him was from 03/2013.  Gwinda Passe will contact pt to schedule download.

## 2013-08-18 ENCOUNTER — Ambulatory Visit: Payer: No Typology Code available for payment source | Admitting: Internal Medicine

## 2013-08-20 ENCOUNTER — Ambulatory Visit: Payer: No Typology Code available for payment source | Attending: Internal Medicine | Admitting: Internal Medicine

## 2013-08-20 ENCOUNTER — Encounter: Payer: Self-pay | Admitting: Internal Medicine

## 2013-08-20 VITALS — BP 132/84 | HR 89 | Temp 98.9°F | Resp 16 | Ht 68.0 in | Wt 252.0 lb

## 2013-08-20 DIAGNOSIS — Z760 Encounter for issue of repeat prescription: Secondary | ICD-10-CM | POA: Insufficient documentation

## 2013-08-20 DIAGNOSIS — E559 Vitamin D deficiency, unspecified: Secondary | ICD-10-CM | POA: Insufficient documentation

## 2013-08-20 DIAGNOSIS — M79609 Pain in unspecified limb: Secondary | ICD-10-CM | POA: Insufficient documentation

## 2013-08-20 DIAGNOSIS — R079 Chest pain, unspecified: Secondary | ICD-10-CM | POA: Insufficient documentation

## 2013-08-20 DIAGNOSIS — N528 Other male erectile dysfunction: Secondary | ICD-10-CM

## 2013-08-20 DIAGNOSIS — Z87891 Personal history of nicotine dependence: Secondary | ICD-10-CM | POA: Insufficient documentation

## 2013-08-20 DIAGNOSIS — E785 Hyperlipidemia, unspecified: Secondary | ICD-10-CM | POA: Insufficient documentation

## 2013-08-20 DIAGNOSIS — E66812 Obesity, class 2: Secondary | ICD-10-CM | POA: Insufficient documentation

## 2013-08-20 DIAGNOSIS — Z6837 Body mass index (BMI) 37.0-37.9, adult: Secondary | ICD-10-CM | POA: Insufficient documentation

## 2013-08-20 DIAGNOSIS — K219 Gastro-esophageal reflux disease without esophagitis: Secondary | ICD-10-CM | POA: Insufficient documentation

## 2013-08-20 DIAGNOSIS — I10 Essential (primary) hypertension: Secondary | ICD-10-CM | POA: Insufficient documentation

## 2013-08-20 DIAGNOSIS — Z1211 Encounter for screening for malignant neoplasm of colon: Secondary | ICD-10-CM

## 2013-08-20 DIAGNOSIS — N529 Male erectile dysfunction, unspecified: Secondary | ICD-10-CM | POA: Insufficient documentation

## 2013-08-20 MED ORDER — NYSTATIN-TRIAMCINOLONE 100000-0.1 UNIT/GM-% EX OINT: 1.0000 "application " | TOPICAL_OINTMENT | Freq: Two times a day (BID) | CUTANEOUS | Status: DC

## 2013-08-20 NOTE — Progress Notes (Signed)
MRN: [id number] Name: Samuel Irwin  Sex: male Age: 53 y.o. DOB: 1960/06/22  Allergies: Review of patient's allergies indicates no known allergies.  Chief Complaint  Patient presents with  . Follow-up    HPI: Patient is 53 y.o. male who has to of hypertension comes today for followup, patient is also requesting refill on Cialis which he use for erectile dysfunction, patient denies any history of cardiac problems any chest pain, she reported to have some bilateral feet pain but which is better now he has been using comfortable shoes, previous blood work reviewed patient has vitamin D deficiency and is taking vitamin D supplement.  Past Medical History  Diagnosis Date  . GERD (gastroesophageal reflux disease)   . Umbilical hernia   . Constipation   . Cough   . Wheezing   . Weakness   . Generalized headaches   . Heart murmur   . Shortness of breath     occ exersion  . Pneumonia     hx  . Sinusitis   . H/O hiatal hernia     Past Surgical History  Procedure Laterality Date  . Femur surgery      left  . Toe surgery      dislocated rt foot  . Carpal tunnel release      R hand  . Umbilical hernia repair N/A 04/07/2012    Procedure: HERNIA REPAIR UMBILICAL ADULT;  Surgeon: Madilyn Hook, DO;  Location: [place];  Service: General;  Laterality: N/A;  open umbilical hernia repair with mesh  . Insertion of mesh N/A 04/07/2012    Procedure: INSERTION OF MESH;  Surgeon: Madilyn Hook, DO;  Location: [place];  Service: General;  Laterality: N/A;  . Hernia repair  04/07/12    umb hernia repair      Medication List       This list is accurate as of: 08/20/13 12:24 PM.  Always use your most recent med list.               acyclovir 400 MG tablet  Commonly known as:  ZOVIRAX  Take 400 mg by mouth 2 (two) times daily.     ergocalciferol 50000 UNITS capsule  Commonly known as:  VITAMIN D2  Take 1 capsule (50,000 Units total) by mouth once a week.     hydrochlorothiazide 25 MG  tablet  Commonly known as:  HYDRODIURIL  Take 1 tablet (25 mg total) by mouth daily.     multivitamin with minerals Tabs tablet  Take 1 tablet by mouth daily.     nystatin-triamcinolone ointment  Commonly known as:  MYCOLOG  Apply 1 application topically 2 (two) times daily.     tadalafil 20 MG tablet  Commonly known as:  CIALIS  Take 0.5 tablets (10 mg total) by mouth every other day as needed for erectile dysfunction.     VISINE ADVANCED RELIEF OP  Place 2 drops into both eyes daily as needed (q). For eye dryness        Meds ordered this encounter  Medications  . nystatin-triamcinolone ointment (MYCOLOG)    Sig: Apply 1 application topically 2 (two) times daily.    Dispense:  30 g    Refill:  1     There is no immunization history on file for this patient.  Family History  Problem Relation Age of Onset  . Cancer Father     lung    History  Substance Use Topics  . Smoking status:  Former Smoker -- 15 years    Types: Cigarettes    Quit date: 02/26/2013  . Smokeless tobacco: Not on file     Comment: Pt reports quit 02/26/13-- only smoked with alcohol  . Alcohol Use: No     Comment: stopped Feb 26, 2013    Review of Systems   As noted in HPI  Filed Vitals:   08/20/13 1153  BP: 132/84  Pulse: 89  Temp: 98.9 F (37.2 C)  Resp: 16    Physical Exam  Physical Exam  Constitutional: No distress.  Eyes: EOM are normal. Pupils are equal, round, and reactive to light.  Cardiovascular: Normal rate and regular rhythm.   Pulmonary/Chest: Breath sounds normal. No respiratory distress. He has no wheezes. He has no rales.  Musculoskeletal: He exhibits no edema.    CBC    Component Value Date/Time   WBC 7.2 05/18/2013 1540   RBC 5.42 05/18/2013 1540   HGB 16.5 05/18/2013 1540   HCT 47.2 05/18/2013 1540   PLT 335 05/18/2013 1540   MCV 87.1 05/18/2013 1540   LYMPHSABS 2.7 05/18/2013 1540   MONOABS 0.6 05/18/2013 1540   EOSABS 0.3 05/18/2013 1540   BASOSABS 0.1  05/18/2013 1540    CMP     Component Value Date/Time   NA 140 05/18/2013 1540   K 4.2 05/18/2013 1540   CL 102 05/18/2013 1540   CO2 27 05/18/2013 1540   GLUCOSE 115* 05/18/2013 1540   BUN 14 05/18/2013 1540   CREATININE 1.31 05/18/2013 1540   CREATININE 1.27 04/06/2012 1413   CALCIUM 9.8 05/18/2013 1540   PROT 7.4 05/18/2013 1540   ALBUMIN 4.5 05/18/2013 1540   AST 16 05/18/2013 1540   ALT 24 05/18/2013 1540   ALKPHOS 66 05/18/2013 1540   BILITOT 0.5 05/18/2013 1540   GFRNONAA 63 05/18/2013 1540   GFRNONAA 64* 04/06/2012 1413   GFRAA 72 05/18/2013 1540   GFRAA 75* 04/06/2012 1413    Lab Results  Component Value Date/Time   CHOL 213* 05/18/2013  3:40 PM    No components found with this basename: hga1c    Lab Results  Component Value Date/Time   AST 16 05/18/2013  3:40 PM    Assessment and Plan  Essential hypertension, benign - Plan: Blood pressure is controlled will repeat blood chemistry COMPLETE METABOLIC PANEL WITH GFR  Morbid obesity Diet and exercise  Dyslipidemia Advised patient for low fat diet  Other male erectile dysfunction Patient is given refill on Cialis.   Special screening for malignant neoplasms, colon - Plan: Ambulatory referral to Gastroenterology   Health Maintenance -Colonoscopy: referred to GI   Return in about 3 months (around 11/20/2013) for hypertension.  Lorayne Marek, MD

## 2013-08-20 NOTE — Progress Notes (Signed)
Pt is here today following up on his HTN. Pt reports having pain in the bottom of his feet. Pt is requesting a colonoscopy. Pt states that his arms cramp and get soar.

## 2013-08-20 NOTE — Patient Instructions (Signed)
Fat and Cholesterol Control Diet  Fat and cholesterol levels in your blood and organs are influenced by your diet. High levels of fat and cholesterol may lead to diseases of the heart, small and large blood vessels, gallbladder, liver, and pancreas.  CONTROLLING FAT AND CHOLESTEROL WITH DIET  Although exercise and lifestyle factors are important, your diet is key. That is because certain foods are known to raise cholesterol and others to lower it. The goal is to balance foods for their effect on cholesterol and more importantly, to replace saturated and trans fat with other types of fat, such as monounsaturated fat, polyunsaturated fat, and omega-3 fatty acids.  On average, a person should consume no more than 15 to 17 g of saturated fat daily. Saturated and trans fats are considered "bad" fats, and they will raise LDL cholesterol. Saturated fats are primarily found in animal products such as meats, butter, and cream. However, that does not mean you need to give up all your favorite foods. Today, there are good tasting, low-fat, low-cholesterol substitutes for most of the things you like to eat. Choose low-fat or nonfat alternatives. Choose round or loin cuts of red meat. These types of cuts are lowest in fat and cholesterol. Chicken (without the skin), fish, veal, and ground turkey breast are great choices. Eliminate fatty meats, such as hot dogs and salami. Even shellfish have little or no saturated fat. Have a 3 oz (85 g) portion when you eat lean meat, poultry, or fish.  Trans fats are also called "partially hydrogenated oils." They are oils that have been scientifically manipulated so that they are solid at room temperature resulting in a longer shelf life and improved taste and texture of foods in which they are added. Trans fats are found in stick margarine, some tub margarines, cookies, crackers, and baked goods.   When baking and cooking, oils are a great substitute for butter. The monounsaturated oils are  especially beneficial since it is believed they lower LDL and raise HDL. The oils you should avoid entirely are saturated tropical oils, such as coconut and palm.   Remember to eat a lot from food groups that are naturally free of saturated and trans fat, including fish, fruit, vegetables, beans, grains (barley, rice, couscous, bulgur wheat), and pasta (without cream sauces).   IDENTIFYING FOODS THAT LOWER FAT AND CHOLESTEROL   Soluble fiber may lower your cholesterol. This type of fiber is found in fruits such as apples, vegetables such as broccoli, potatoes, and carrots, legumes such as beans, peas, and lentils, and grains such as barley. Foods fortified with plant sterols (phytosterol) may also lower cholesterol. You should eat at least 2 g per day of these foods for a cholesterol lowering effect.   Read package labels to identify low-saturated fats, trans fat free, and low-fat foods at the supermarket. Select cheeses that have only 2 to 3 g saturated fat per ounce. Use a heart-healthy tub margarine that is free of trans fats or partially hydrogenated oil. When buying baked goods (cookies, crackers), avoid partially hydrogenated oils. Breads and muffins should be made from whole grains (whole-wheat or whole oat flour, instead of "flour" or "enriched flour"). Buy non-creamy canned soups with reduced salt and no added fats.   FOOD PREPARATION TECHNIQUES   Never deep-fry. If you must fry, either stir-fry, which uses very little fat, or use non-stick cooking sprays. When possible, broil, bake, or roast meats, and steam vegetables. Instead of putting butter or margarine on vegetables, use lemon   and herbs, applesauce, and cinnamon (for squash and sweet potatoes). Use nonfat yogurt, salsa, and low-fat dressings for salads.   LOW-SATURATED FAT / LOW-FAT FOOD SUBSTITUTES  Meats / Saturated Fat (g)  · Avoid: Steak, marbled (3 oz/85 g) / 11 g  · Choose: Steak, lean (3 oz/85 g) / 4 g  · Avoid: Hamburger (3 oz/85 g) / 7  g  · Choose: Hamburger, lean (3 oz/85 g) / 5 g  · Avoid: Ham (3 oz/85 g) / 6 g  · Choose: Ham, lean cut (3 oz/85 g) / 2.4 g  · Avoid: Chicken, with skin, dark meat (3 oz/85 g) / 4 g  · Choose: Chicken, skin removed, dark meat (3 oz/85 g) / 2 g  · Avoid: Chicken, with skin, light meat (3 oz/85 g) / 2.5 g  · Choose: Chicken, skin removed, light meat (3 oz/85 g) / 1 g  Dairy / Saturated Fat (g)  · Avoid: Whole milk (1 cup) / 5 g  · Choose: Low-fat milk, 2% (1 cup) / 3 g  · Choose: Low-fat milk, 1% (1 cup) / 1.5 g  · Choose: Skim milk (1 cup) / 0.3 g  · Avoid: Hard cheese (1 oz/28 g) / 6 g  · Choose: Skim milk cheese (1 oz/28 g) / 2 to 3 g  · Avoid: Cottage cheese, 4% fat (1 cup) / 6.5 g  · Choose: Low-fat cottage cheese, 1% fat (1 cup) / 1.5 g  · Avoid: Ice cream (1 cup) / 9 g  · Choose: Sherbet (1 cup) / 2.5 g  · Choose: Nonfat frozen yogurt (1 cup) / 0.3 g  · Choose: Frozen fruit bar / trace  · Avoid: Whipped cream (1 tbs) / 3.5 g  · Choose: Nondairy whipped topping (1 tbs) / 1 g  Condiments / Saturated Fat (g)  · Avoid: Mayonnaise (1 tbs) / 2 g  · Choose: Low-fat mayonnaise (1 tbs) / 1 g  · Avoid: Butter (1 tbs) / 7 g  · Choose: Extra light margarine (1 tbs) / 1 g  · Avoid: Coconut oil (1 tbs) / 11.8 g  · Choose: Olive oil (1 tbs) / 1.8 g  · Choose: Corn oil (1 tbs) / 1.7 g  · Choose: Safflower oil (1 tbs) / 1.2 g  · Choose: Sunflower oil (1 tbs) / 1.4 g  · Choose: Soybean oil (1 tbs) / 2.4 g  · Choose: Canola oil (1 tbs) / 1 g  Document Released: 02/11/2005 Document Revised: 06/08/2012 Document Reviewed: 08/02/2010  Exit[employer]® Patient Information ©2015 [employer]. This information is not intended to replace advice given to you by your health care Samuel Irwin. Make sure you discuss any questions you have with your health care Samuel Irwin.

## 2013-08-21 LAB — COMPLETE METABOLIC PANEL WITH GFR
ALK PHOS: 66 U/L (ref 39–117)
ALT: 17 U/L (ref 0–53)
AST: 15 U/L (ref 0–37)
Albumin: 4.6 g/dL (ref 3.5–5.2)
BILIRUBIN TOTAL: 0.7 mg/dL (ref 0.2–1.2)
BUN: 10 mg/dL (ref 6–23)
CO2: 27 mEq/L (ref 19–32)
Calcium: 9.9 mg/dL (ref 8.4–10.5)
Chloride: 103 mEq/L (ref 96–112)
Creat: 1.14 mg/dL (ref 0.50–1.35)
GFR, EST NON AFRICAN AMERICAN: 74 mL/min
GFR, Est African American: 86 mL/min
Glucose, Bld: 105 mg/dL — ABNORMAL HIGH (ref 70–99)
Potassium: 3.9 mEq/L (ref 3.5–5.3)
SODIUM: 140 meq/L (ref 135–145)
Total Protein: 7.9 g/dL (ref 6.0–8.3)

## 2013-08-26 ENCOUNTER — Ambulatory Visit: Payer: No Typology Code available for payment source | Attending: Internal Medicine

## 2013-08-31 ENCOUNTER — Other Ambulatory Visit: Payer: Self-pay

## 2013-08-31 MED ORDER — TADALAFIL 20 MG PO TABS: 10.0000 mg | ORAL_TABLET | ORAL | Status: DC | PRN

## 2013-09-08 ENCOUNTER — Telehealth: Payer: Self-pay | Admitting: Pulmonary Disease

## 2013-09-08 NOTE — Telephone Encounter (Signed)
Spoke with pt and he states that he still has not turned in machine for download.  I explained to pt that we cannot tell what is going on with his machine and the pressure settings until we get this download.  Encouraged to wear cpap as much as possible for next 2 weeks and then turn machine in for download and then we can make some decisions regarding treatment.  Pt verbalized understanding and states he will try his best.

## 2013-09-08 NOTE — Telephone Encounter (Signed)
[email or web address].Jennifer Castillo, CMA  

## 2013-09-08 NOTE — Telephone Encounter (Signed)
Let pt know that I did get a download recently from April, and his apnea is well controlled on his current setting.  He is set on 10-20 cm by auto, and this should self adjust to his needs. Stick with it for awhile, and let us know if continues.

## 2013-09-09 NOTE — Telephone Encounter (Signed)
lmtcb w/ family member 

## 2013-09-16 NOTE — Telephone Encounter (Signed)
LMTCBx3. Jennifer Castillo, CMA  

## 2013-09-23 ENCOUNTER — Ambulatory Visit: Payer: Self-pay

## 2013-09-27 ENCOUNTER — Telehealth: Payer: Self-pay | Admitting: Pulmonary Disease

## 2013-09-27 DIAGNOSIS — G4733 Obstructive sleep apnea (adult) (pediatric): Secondary | ICD-10-CM

## 2013-09-27 NOTE — Telephone Encounter (Signed)
Let pt know that he should still be on auto setting 10-20cm unless he changed the machine back.  If he is still on auto, will need dme to get download off machine for last 4 weeks so I can pick an optimal pressure off the download.  Can't just pick 16 and go with it.  May not be enough.

## 2013-09-27 NOTE — Telephone Encounter (Signed)
lmomtcb x1 

## 2013-09-27 NOTE — Telephone Encounter (Signed)
Called spoke with pt. He reports his current CPAP machine is set at 10 cm. He wants to increase his pressure to 16 cm. He report he wears CPAP every night. He is still feeling sluggish and can call asleep throughout the day. He reports he can't sit still any longer w/o wanting to go to sleep. Please advise Samuel Irwin thanks

## 2013-09-27 NOTE — Telephone Encounter (Signed)
Called spoke with pt. Aware we will get download off machine. Nothing further needed

## 2013-09-27 NOTE — Telephone Encounter (Signed)
Patient returning call.  [phone]

## 2013-10-18 ENCOUNTER — Telehealth: Payer: Self-pay | Admitting: *Deleted

## 2013-10-18 DIAGNOSIS — G4733 Obstructive sleep apnea (adult) (pediatric): Secondary | ICD-10-CM

## 2013-10-18 NOTE — Telephone Encounter (Signed)
Kathee Delton, MD at 09/27/2013 5:26 PM     Status: Signed        Let pt know that he should still be on auto setting 10-20cm unless he changed the machine back. If he is still on auto, will need dme to get download off machine for last 4 weeks so I can pick an optimal pressure off the download. Can't just pick 16 and go with it. May not be enough.    I received a download but it looks like the pt is on a set pressure of 10. Download placed in your green folder. [place] Bing, CMA

## 2013-10-18 NOTE — Telephone Encounter (Signed)
His machine was on auto 10-20cm in April and should have been left there.  How did he get back on 10cm?? I want the pt on 10-20cm, and leave there.  Should cover the events.  Pt to let us know if not doing better on this.

## 2013-10-18 NOTE — Telephone Encounter (Signed)
i called and spoke with pt and he is aware of order sent to [facility] for the cpap to be set on 10-20 cm.  Pt is aware that [facility] will contact him to set up a time to fix this.  Nothing further is needed.

## 2013-10-19 ENCOUNTER — Telehealth: Payer: Self-pay | Admitting: Pulmonary Disease

## 2013-10-19 DIAGNOSIS — G4733 Obstructive sleep apnea (adult) (pediatric): Secondary | ICD-10-CM

## 2013-10-19 NOTE — Telephone Encounter (Signed)
lmomtcb x 1 for Samuel Irwin from [facility].

## 2013-10-20 NOTE — Telephone Encounter (Signed)
I spoke with [name] from [facility] and she states that pt has a machine through the [employer], cpap assistance program, and it is not auto capable. She states in the past they gave him a loaner and did auto for 2 weeks with download and that is how the pt go his current setting. Does this need to be done again? [place] Bing, CMA

## 2013-10-20 NOTE — Telephone Encounter (Signed)
[name] returning call.Stanley A Dalton °

## 2013-10-26 NOTE — Telephone Encounter (Signed)
I spoke with [name] and order has been placed to do auto 5-20 with download again. [place] Bing, CMA

## 2013-10-26 NOTE — Telephone Encounter (Signed)
lmomtcb x1 

## 2013-10-26 NOTE — Telephone Encounter (Signed)
Samuel Irwin returned call [phone] °

## 2013-10-26 NOTE — Telephone Encounter (Signed)
If the pt is not on an auto device, and feels pressure is not adequate, then will need auto 5-20 for 2-3 weeks with downlload to me.

## 2013-10-27 ENCOUNTER — Ambulatory Visit: Payer: Self-pay | Admitting: Pulmonary Disease

## 2013-11-19 ENCOUNTER — Ambulatory Visit: Payer: Self-pay | Admitting: Internal Medicine

## 2013-12-21 ENCOUNTER — Encounter (HOSPITAL_COMMUNITY): Payer: Self-pay | Admitting: Emergency Medicine

## 2013-12-21 ENCOUNTER — Emergency Department (HOSPITAL_COMMUNITY): Payer: Self-pay

## 2013-12-21 ENCOUNTER — Observation Stay (HOSPITAL_COMMUNITY)
Admission: EM | Admit: 2013-12-21 | Discharge: 2013-12-22 | Disposition: A | Discharge status: Home / Self Care | Payer: Self-pay | Attending: Internal Medicine | Admitting: Internal Medicine

## 2013-12-21 DIAGNOSIS — K219 Gastro-esophageal reflux disease without esophagitis: Secondary | ICD-10-CM | POA: Insufficient documentation

## 2013-12-21 DIAGNOSIS — Z23 Encounter for immunization: Secondary | ICD-10-CM | POA: Insufficient documentation

## 2013-12-21 DIAGNOSIS — R079 Chest pain, unspecified: Secondary | ICD-10-CM | POA: Diagnosis present

## 2013-12-21 DIAGNOSIS — F17210 Nicotine dependence, cigarettes, uncomplicated: Secondary | ICD-10-CM | POA: Insufficient documentation

## 2013-12-21 DIAGNOSIS — G4733 Obstructive sleep apnea (adult) (pediatric): Secondary | ICD-10-CM | POA: Diagnosis present

## 2013-12-21 DIAGNOSIS — R072 Precordial pain: Secondary | ICD-10-CM

## 2013-12-21 DIAGNOSIS — Z9119 Patient's noncompliance with other medical treatment and regimen: Secondary | ICD-10-CM

## 2013-12-21 DIAGNOSIS — Z91199 Patient's noncompliance with other medical treatment and regimen due to unspecified reason: Secondary | ICD-10-CM

## 2013-12-21 DIAGNOSIS — E785 Hyperlipidemia, unspecified: Secondary | ICD-10-CM | POA: Diagnosis present

## 2013-12-21 DIAGNOSIS — R0789 Other chest pain: Principal | ICD-10-CM | POA: Insufficient documentation

## 2013-12-21 DIAGNOSIS — E8881 Metabolic syndrome: Secondary | ICD-10-CM | POA: Insufficient documentation

## 2013-12-21 DIAGNOSIS — I10 Essential (primary) hypertension: Secondary | ICD-10-CM | POA: Diagnosis present

## 2013-12-21 DIAGNOSIS — Z9114 Patient's other noncompliance with medication regimen: Secondary | ICD-10-CM | POA: Insufficient documentation

## 2013-12-21 DIAGNOSIS — F1099 Alcohol use, unspecified with unspecified alcohol-induced disorder: Secondary | ICD-10-CM | POA: Insufficient documentation

## 2013-12-21 LAB — TROPONIN I
Troponin I: <0.3 ng/mL (ref <0.30)
Troponin I: <0.3 ng/mL (ref <0.30)

## 2013-12-21 LAB — CBC WITH DIFFERENTIAL/PLATELET
BASOS ABS: 0 10*3/uL (ref 0.0–0.1)
BASOS PCT: 1 % (ref 0–1)
EOS ABS: 0.2 10*3/uL (ref 0.0–0.7)
EOS PCT: 3 % (ref 0–5)
HEMATOCRIT: 46.6 % (ref 39.0–52.0)
HEMOGLOBIN: 15.7 g/dL (ref 13.0–17.0)
Lymphocytes Relative: 34 % (ref 12–46)
Lymphs Abs: 2.3 10*3/uL (ref 0.7–4.0)
MCH: 31.1 pg (ref 26.0–34.0)
MCHC: 33.7 g/dL (ref 30.0–36.0)
MCV: 92.3 fL (ref 78.0–100.0)
Monocytes Absolute: 0.5 10*3/uL (ref 0.1–1.0)
Monocytes Relative: 8 % (ref 3–12)
NEUTROS ABS: 3.6 10*3/uL (ref 1.7–7.7)
NEUTROS PCT: 54 % (ref 43–77)
Platelets: 290 10*3/uL (ref 150–400)
RBC: 5.05 MIL/uL (ref 4.22–5.81)
RDW: 14.7 % (ref 11.5–15.5)
WBC: 6.7 10*3/uL (ref 4.0–10.5)

## 2013-12-21 LAB — COMPREHENSIVE METABOLIC PANEL
ALBUMIN: 3.8 g/dL (ref 3.5–5.2)
ALT: 17 U/L (ref 0–53)
AST: 14 U/L (ref 0–37)
Alkaline Phosphatase: 70 U/L (ref 39–117)
Anion gap: 13 (ref 5–15)
BUN: 11 mg/dL (ref 6–23)
CALCIUM: 9.9 mg/dL (ref 8.4–10.5)
CO2: 26 mEq/L (ref 19–32)
CREATININE: 1.06 mg/dL (ref 0.50–1.35)
Chloride: 101 mEq/L (ref 96–112)
GFR calc Af Amer: >90 mL/min (ref >90)
GFR calc non Af Amer: 80 mL/min — ABNORMAL LOW (ref >90)
Glucose, Bld: 105 mg/dL — ABNORMAL HIGH (ref 70–99)
Potassium: 4.1 mEq/L (ref 3.7–5.3)
SODIUM: 140 meq/L (ref 137–147)
TOTAL PROTEIN: 7.4 g/dL (ref 6.0–8.3)
Total Bilirubin: 0.3 mg/dL (ref 0.3–1.2)

## 2013-12-21 LAB — D-DIMER, QUANTITATIVE (NOT AT ARMC): D DIMER QUANT: 0.29 ug{FEU}/mL (ref 0.00–0.48)

## 2013-12-21 LAB — TSH: TSH: 1.7 u[IU]/mL (ref 0.350–4.500)

## 2013-12-21 MED ORDER — ALBUTEROL SULFATE (2.5 MG/3ML) 0.083% IN NEBU: 2.5000 mg | INHALATION_SOLUTION | RESPIRATORY_TRACT | Status: DC | PRN

## 2013-12-21 MED ORDER — VITAMIN B-1 100 MG PO TABS
100.0000 mg | ORAL_TABLET | Freq: Every day | ORAL | Status: DC
  Administered 2013-12-21 – 2013-12-22 (×2): 100 mg via ORAL
  Filled 2013-12-21 (×2): qty 1

## 2013-12-21 MED ORDER — ONDANSETRON HCL 4 MG PO TABS: 4.0000 mg | ORAL_TABLET | Freq: Four times a day (QID) | ORAL | Status: DC | PRN

## 2013-12-21 MED ORDER — PNEUMOCOCCAL VAC POLYVALENT 25 MCG/0.5ML IJ INJ
0.5000 mL | INJECTION | INTRAMUSCULAR | Status: AC
  Administered 2013-12-22: 0.5 mL via INTRAMUSCULAR
  Filled 2013-12-21: qty 0.5

## 2013-12-21 MED ORDER — ACYCLOVIR 400 MG PO TABS
400.0000 mg | ORAL_TABLET | Freq: Every day | ORAL | Status: DC
  Filled 2013-12-21: qty 1

## 2013-12-21 MED ORDER — ACETAMINOPHEN 325 MG PO TABS
650.0000 mg | ORAL_TABLET | Freq: Four times a day (QID) | ORAL | Status: DC | PRN
  Administered 2013-12-22: 650 mg via ORAL

## 2013-12-21 MED ORDER — THIAMINE HCL 100 MG/ML IJ SOLN
100.0000 mg | Freq: Every day | INTRAMUSCULAR | Status: DC
  Filled 2013-12-21 (×2): qty 1

## 2013-12-21 MED ORDER — ASPIRIN 81 MG PO CHEW
324.0000 mg | CHEWABLE_TABLET | Freq: Once | ORAL | Status: AC
  Administered 2013-12-21: 324 mg via ORAL
  Filled 2013-12-21: qty 4

## 2013-12-21 MED ORDER — MORPHINE SULFATE 2 MG/ML IJ SOLN: 1.0000 mg | INTRAMUSCULAR | Status: DC | PRN

## 2013-12-21 MED ORDER — SODIUM CHLORIDE 0.9 % IV BOLUS (SEPSIS)
500.0000 mL | Freq: Once | INTRAVENOUS | Status: AC
  Administered 2013-12-21: 500 mL via INTRAVENOUS

## 2013-12-21 MED ORDER — ACETAMINOPHEN 650 MG RE SUPP: 650.0000 mg | Freq: Four times a day (QID) | RECTAL | Status: DC | PRN

## 2013-12-21 MED ORDER — ASPIRIN EC 81 MG PO TBEC
81.0000 mg | DELAYED_RELEASE_TABLET | Freq: Every day | ORAL | Status: DC
  Administered 2013-12-22: 81 mg via ORAL
  Filled 2013-12-21: qty 1

## 2013-12-21 MED ORDER — SODIUM CHLORIDE 0.9 % IJ SOLN: 3.0000 mL | INTRAMUSCULAR | Status: DC | PRN

## 2013-12-21 MED ORDER — SODIUM CHLORIDE 0.9 % IV SOLN: 250.0000 mL | INTRAVENOUS | Status: DC | PRN

## 2013-12-21 MED ORDER — ACYCLOVIR 200 MG PO CAPS
400.0000 mg | ORAL_CAPSULE | Freq: Every day | ORAL | Status: DC
  Administered 2013-12-22: 400 mg via ORAL
  Filled 2013-12-21 (×2): qty 2

## 2013-12-21 MED ORDER — ENOXAPARIN SODIUM 40 MG/0.4ML ~~LOC~~ SOLN
40.0000 mg | SUBCUTANEOUS | Status: DC
  Filled 2013-12-21 (×2): qty 0.4

## 2013-12-21 MED ORDER — ADULT MULTIVITAMIN W/MINERALS CH
1.0000 | ORAL_TABLET | Freq: Every day | ORAL | Status: DC
  Administered 2013-12-21 – 2013-12-22 (×2): 1 via ORAL
  Filled 2013-12-21 (×2): qty 1

## 2013-12-21 MED ORDER — FOLIC ACID 1 MG PO TABS
1.0000 mg | ORAL_TABLET | Freq: Every day | ORAL | Status: DC
  Administered 2013-12-21 – 2013-12-22 (×2): 1 mg via ORAL
  Filled 2013-12-21 (×2): qty 1

## 2013-12-21 MED ORDER — SODIUM CHLORIDE 0.9 % IJ SOLN
3.0000 mL | Freq: Two times a day (BID) | INTRAMUSCULAR | Status: DC
  Administered 2013-12-22: 3 mL via INTRAVENOUS

## 2013-12-21 MED ORDER — LORAZEPAM 1 MG PO TABS: 1.0000 mg | ORAL_TABLET | Freq: Four times a day (QID) | ORAL | Status: DC | PRN

## 2013-12-21 MED ORDER — PANTOPRAZOLE SODIUM 40 MG PO TBEC
40.0000 mg | DELAYED_RELEASE_TABLET | Freq: Every day | ORAL | Status: DC
  Administered 2013-12-21 – 2013-12-22 (×2): 40 mg via ORAL
  Filled 2013-12-21 (×2): qty 1

## 2013-12-21 MED ORDER — LORAZEPAM 2 MG/ML IJ SOLN: 1.0000 mg | Freq: Four times a day (QID) | INTRAMUSCULAR | Status: DC | PRN

## 2013-12-21 MED ORDER — INFLUENZA VAC SPLIT QUAD 0.5 ML IM SUSY
0.5000 mL | PREFILLED_SYRINGE | INTRAMUSCULAR | Status: AC
  Administered 2013-12-22: 0.5 mL via INTRAMUSCULAR
  Filled 2013-12-21: qty 0.5

## 2013-12-21 MED ORDER — ONDANSETRON HCL 4 MG/2ML IJ SOLN: 4.0000 mg | Freq: Four times a day (QID) | INTRAMUSCULAR | Status: DC | PRN

## 2013-12-21 MED ORDER — SODIUM CHLORIDE 0.9 % IJ SOLN: 3.0000 mL | Freq: Two times a day (BID) | INTRAMUSCULAR | Status: DC

## 2013-12-21 NOTE — ED Notes (Addendum)
Pt reports the Left neck pain has been consistent since Saturday.  Sharp pains worse with motion.  Pt taking Aleve at home.  Pt states he is not taking his blood pressure like he is directed by his doctor.  Denies blurring vision.  Denies abdominal pain.  Denies pain to palpation to the left neck and left shoulder.

## 2013-12-21 NOTE — H&P (Signed)
[facility] History and Physical  CONSTANTINO STARACE KDX:[id number] DOB: [birth year] DOA: 12/21/2013   PCP: Lorayne Marek, MD  Specialists: None  Chief Complaint: Chest pain  HPI: Samuel Irwin is a 53 y.o. male with a past medical history of hypertension who was in his usual state of health until Saturday when he started experiencing pain in the left side of his chest. Patient is a poor historian. He was unable to describe his symptoms. He tells me he experienced sharp pain. It was irrespective of his activity level. And then he would tell me that when he would move around and change his body position he would feel some pain. But this is not consistent. There was no radiation of this pain. He denies any diaphoresis. It would increase with deep breathing. But denies any shortness of breath per se. At worst it was 5 out of 10. Currently denies any chest pain. He hasn't had any nausea, vomiting. No falls or injuries. No cough. No fever, no chills. He does get lower leg swelling occasionally. He said he had similar pain about 2 years ago. Denies having had any cardiac testing at that time except for an EKG. Occasionally gets heartburns, but not on a daily basis. Overall, not a very good historian. He would change his story quite often.  Home Medications: Prior to Admission medications   Medication Sig Start Date End Date Taking? Authorizing Provider  acyclovir (ZOVIRAX) 400 MG tablet Take 400 mg by mouth daily.    Yes Historical Provider, MD  ergocalciferol (VITAMIN D2) 50000 UNITS capsule Take 1 capsule (50,000 Units total) by mouth once a week. 05/19/13  Yes Reyne Dumas, MD  hydrochlorothiazide (HYDRODIURIL) 25 MG tablet Take 1 tablet (25 mg total) by mouth daily. 05/18/13  Yes Reyne Dumas, MD  tadalafil (CIALIS) 20 MG tablet Take 0.5 tablets (10 mg total) by mouth every other day as needed for erectile dysfunction. 08/31/13  Yes Lorayne Marek, MD    Allergies: No Known Allergies  Past  Medical History: Past Medical History  Diagnosis Date  . GERD (gastroesophageal reflux disease)   . Umbilical hernia   . Constipation   . Cough   . Wheezing   . Weakness   . Generalized headaches   . Heart murmur   . Shortness of breath     occ exersion  . Pneumonia     hx  . Sinusitis   . H/O hiatal hernia     Past Surgical History  Procedure Laterality Date  . Femur surgery      left  . Toe surgery      dislocated rt foot  . Carpal tunnel release      R hand  . Umbilical hernia repair N/A 04/07/2012    Procedure: HERNIA REPAIR UMBILICAL ADULT;  Surgeon: Madilyn Hook, DO;  Location: [place];  Service: General;  Laterality: N/A;  open umbilical hernia repair with mesh  . Insertion of mesh N/A 04/07/2012    Procedure: INSERTION OF MESH;  Surgeon: Madilyn Hook, DO;  Location: [place];  Service: General;  Laterality: N/A;  . Hernia repair  04/07/12    umb hernia repair    Social History: He lives in [place] with his mother. He smokes the about quarter to half pack per day. Drinks three 40 ounces of beer twice a week. No liquor use. No wine intake. Independent with daily activities.  Family History:  Family History  Problem Relation Age of Onset  . Cancer Father  lung     Review of Systems - History obtained from the patient General ROS: negative Psychological ROS: negative Ophthalmic ROS: negative ENT ROS: negative Allergy and Immunology ROS: negative Hematological and Lymphatic ROS: negative Endocrine ROS: negative Respiratory ROS: no cough, shortness of breath, or wheezing Cardiovascular ROS: as in hpi Gastrointestinal ROS: no abdominal pain, change in bowel habits, or black or bloody stools Genito-Urinary ROS: no dysuria, trouble voiding, or hematuria Musculoskeletal ROS: negative Neurological ROS: no TIA or stroke symptoms Dermatological ROS: negative  Physical Examination  Filed Vitals:   12/21/13 1130 12/21/13 1145 12/21/13 1200 12/21/13 1245  BP:  139/87 132/88 121/85 143/104  Pulse: 73 59 74 66  Temp:      TempSrc:      Resp: _0 Height:      Weight:      SpO2: 93% 96% 94% 96%    BP 143/104  Pulse 66  Temp(Src) 98.3 F (36.8 C) (Oral)  Resp 15  Ht _1  (1.727 m)  Wt 115.214 kg (254 lb)  BMI 38.63 kg/m2  SpO2 96%  General appearance: alert, cooperative, appears stated age, no distress and moderately obese Head: Normocephalic, without obvious abnormality, atraumatic Eyes: conjunctivae/corneas clear. PERRL, EOM's intact.  Throat: lips, mucosa, and tongue normal; teeth and gums normal Neck: no adenopathy, no carotid bruit, no JVD, supple, symmetrical, trachea midline and thyroid not enlarged, symmetric, no tenderness/mass/nodules Resp: clear to auscultation bilaterally Chest wall: no tenderness Cardio: regular rate and rhythm, S1, S2 normal, no murmur, click, rub or gallop GI: soft, non-tender; bowel sounds normal; no masses,  no organomegaly Extremities: extremities normal, atraumatic, no cyanosis or edema Pulses: 2+ and symmetric Skin: Skin color, texture, turgor normal. No rashes or lesions Lymph nodes: Cervical, supraclavicular, and axillary nodes normal. Neurologic: Cranial nerves are intact. 2-12. Motor strength equal bilateral upper and lower extremities. 8. No distress.  Laboratory Data: Results for orders placed during the hospital encounter of 12/21/13 (from the past 48 hour(s))  CBC WITH DIFFERENTIAL     Status: None   Collection Time    12/21/13 11:10 AM      Result Value Ref Range   WBC 6.7  4.0 - 10.5 K/uL   RBC 5.05  4.22 - 5.81 MIL/uL   Hemoglobin 15.7  13.0 - 17.0 g/dL   HCT 46.6  39.0 - 52.0 %   MCV 92.3  78.0 - 100.0 fL   MCH 31.1  26.0 - 34.0 pg   MCHC 33.7  30.0 - 36.0 g/dL   RDW 14.7  11.5 - 15.5 %   Platelets 290  150 - 400 K/uL   Neutrophils Relative % 54  43 - 77 %   Neutro Abs 3.6  1.7 - 7.7 K/uL   Lymphocytes Relative 34  12 - 46 %   Lymphs Abs 2.3  0.7 - 4.0 K/uL    Monocytes Relative 8  3 - 12 %   Monocytes Absolute 0.5  0.1 - 1.0 K/uL   Eosinophils Relative 3  0 - 5 %   Eosinophils Absolute 0.2  0.0 - 0.7 K/uL   Basophils Relative 1  0 - 1 %   Basophils Absolute 0.0  0.0 - 0.1 K/uL  COMPREHENSIVE METABOLIC PANEL     Status: Abnormal   Collection Time    12/21/13 11:10 AM      Result Value Ref Range   Sodium 140  137 - 147 mEq/L   Potassium 4.1  3.7 -  5.3 mEq/L   Chloride 101  96 - 112 mEq/L   CO2 26  19 - 32 mEq/L   Glucose, Bld 105 (*) 70 - 99 mg/dL   BUN 11  6 - 23 mg/dL   Creatinine, Ser 1.06  0.50 - 1.35 mg/dL   Calcium 9.9  8.4 - 10.5 mg/dL   Total Protein 7.4  6.0 - 8.3 g/dL   Albumin 3.8  3.5 - 5.2 g/dL   AST 14  0 - 37 U/L   ALT 17  0 - 53 U/L   Alkaline Phosphatase 70  39 - 117 U/L   Total Bilirubin 0.3  0.3 - 1.2 mg/dL   GFR calc non Af Amer 80 (*) >90 mL/min   GFR calc Af Amer >90  >90 mL/min   Comment: (NOTE)     The eGFR has been calculated using the CKD EPI equation.     This calculation has not been validated in all clinical situations.     eGFR's persistently <90 mL/min signify possible Chronic Kidney     Disease.   Anion gap 13  5 - 15  TROPONIN I     Status: None   Collection Time    12/21/13 11:10 AM      Result Value Ref Range   Troponin I <0.30  <0.30 ng/mL   Comment:            Due to the release kinetics of cTnI,     a negative result within the first hours     of the onset of symptoms does not rule out     myocardial infarction with certainty.     If myocardial infarction is still suspected,     repeat the test at appropriate intervals.    Radiology Reports: Dg Chest 2 View  12/21/2013   CLINICAL DATA:  Chest pain. Pain from lifting weights 2-3 weeks ago .  EXAM: CHEST  2 VIEW  COMPARISON:  04/07/2012.  FINDINGS: Mediastinum hilar structures are normal. Lungs are clear. Mild cardiomegaly. No CHF. No pleural effusion or pneumothorax. No acute bony abnormality.  IMPRESSION: 1. Mild cardiomegaly, no CHF. 2.  No acute pulmonary disease.   Electronically Signed   By: Marcello Moores  Register   On: 12/21/2013 13:07    Electrocardiogram: Sinus rhythm with normal rate. Normal axis. Intervals are normal. No Q waves. No concerning ST or T-wave changes are noted.  Problem List  Active Problems:   OSA (obstructive sleep apnea)   Metabolic syndrome   Essential hypertension, benign   Dyslipidemia   Chest pain   Assessment: This is a 53 year old African-American male who presents to the hospital with complaints of chest pain ongoing on and off since Saturday. Hypertension is a risk factor. He also reports a history of hyperlipidemia but is not on medications. Chest pain appears to be atypical. There is some pleuritic component. Pain was not reproducible by palpation. Acid reflux causing pain is a differential.  Plan: #1. Chest pain: We will get a d-dimer. We will observe him overnight and rule him out for acute Coronary syndrome by serial troponin. EKG will be repeated in the morning. Check lipid panel in the morning. Aspirin will be continued for now. PPI.  #2 history of essential hypertension: Monitor blood pressures closely.  #3 history of dyslipidemia: Lipid panel in the morning.   #4 History of obstructive sleep apnea: CPAP every night.  #5. Alcohol use: He was asked to cut back on his alcohol intake.  He is at low risk for going into alcohol withdrawal. Ativan as needed. Thiamine.   DVT Prophylaxis: Lovenox Code Status: Full code Family Communication: Discussed with the patient  Disposition Plan: Observe to telemetry   Further management decisions will depend on results of further testing and patient's response to treatment.   [facility]  [facility] Pager [phone]  If 7PM-7AM, please contact night-coverage [email or web address] Password [email or web address]  12/21/2013, 1:44 PM

## 2013-12-21 NOTE — ED Notes (Signed)
Pt presents with 4 day h/o L sided chest pain.  Pt reports pain is intermittent and does not radiate.  Pt also reports L sided neck pain, increases with inspiration.  Pt denies any nausea or shortness of breath or diaphoresis.

## 2013-12-21 NOTE — Discharge Planning (Signed)
[place] Specialist  Spoke to patient regarding primary care resources and the [facility] orange card. Patients orange card has expired, new application and instructions given. Resource guide and my contact information also provided for any future questions or concerns. No other [place] Specialist needs identified at this time.

## 2013-12-21 NOTE — ED Provider Notes (Signed)
Medical screening examination/treatment/procedure(s) were performed by non-physician practitioner and as supervising physician I was immediately available for consultation/collaboration.   EKG Interpretation   Date/Time:  Tuesday December 21 2013 10:39:19 EDT Ventricular Rate:  87 PR Interval:  144 QRS Duration: 72 QT Interval:  352 QTC Calculation: 423 R Axis:   62 Text Interpretation:  Normal sinus rhythm Normal ECG Confirmed by WARD,   DO, KRISTEN ([id number]) on 12/21/2013 11:52:36 AM      \  [place], DO 12/21/13 1741

## 2013-12-21 NOTE — ED Provider Notes (Signed)
CSN: [id number]     Arrival date & time 12/21/13  1031 History   First MD Initiated Contact with Patient 12/21/13 1046     No chief complaint on file.    (Consider location/radiation/quality/duration/timing/severity/associated sxs/prior Treatment) The history is provided by the patient. No language interpreter was used.  Samuel Irwin is a 53 y/o M with PMHx of HTN, GERD, hiatal hernia presenting to the emergency department with chest pain and left-sided neck pain. Patient reported that the left-sided neck pain came first described as a sharp pain worse with deep relation that started on Saturday. Patient reported a couple of days ago he noticed left-sided chest pain with radiation down the left arm resulting in intermittent tingling to the left hand. Stated that pain is worse with deep inhalation and some motion. Stated that he has been having episodes of elevated blood pressure- when asked if the patient takes medications daily patient reports he takes hydrochlorothiazide, but states that he takes it as needed when he is not feeling well. Reported that he's been using Aleve with relief. Stated that he believes this might be pulled muscle secondary to lifting weights. States that he does smoke cigarettes only when he drinks. Reported that he does not have any family history of cardiac conditions. Denies blurred vision, some loss of vision, headache, dizziness, nausea, vomiting, diarrhea, diaphoresis, shortness of breath, weakness, loss of sensation. PCP Dr. Annitta Needs  Past Medical History  Diagnosis Date  . GERD (gastroesophageal reflux disease)   . Umbilical hernia   . Constipation   . Cough   . Wheezing   . Weakness   . Generalized headaches   . Heart murmur   . Shortness of breath     occ exersion  . Pneumonia     hx  . Sinusitis   . H/O hiatal hernia    Past Surgical History  Procedure Laterality Date  . Femur surgery      left  . Toe surgery      dislocated rt foot  . Carpal  tunnel release      R hand  . Umbilical hernia repair N/A 04/07/2012    Procedure: HERNIA REPAIR UMBILICAL ADULT;  Surgeon: Madilyn Hook, DO;  Location: [place];  Service: General;  Laterality: N/A;  open umbilical hernia repair with mesh  . Insertion of mesh N/A 04/07/2012    Procedure: INSERTION OF MESH;  Surgeon: Madilyn Hook, DO;  Location: [place];  Service: General;  Laterality: N/A;  . Hernia repair  04/07/12    umb hernia repair   Family History  Problem Relation Age of Onset  . Cancer Father     lung   History  Substance Use Topics  . Smoking status: Former Smoker -- 15 years    Types: Cigarettes    Quit date: 02/26/2013  . Smokeless tobacco: Not on file     Comment: Pt reports quit 02/26/13-- only smoked with alcohol  . Alcohol Use: Yes     Comment: 3 40oz beers twice a week    Review of Systems  Constitutional: Negative for fever and chills.  Eyes: Negative for visual disturbance.  Respiratory: Negative for chest tightness and shortness of breath.   Cardiovascular: Positive for chest pain.  Musculoskeletal: Positive for arthralgias.  Neurological: Positive for numbness (Left arm). Negative for dizziness, weakness and headaches.  All other systems reviewed and are negative.     Allergies  Review of patient's allergies indicates no known allergies.  Home Medications  Prior to Admission medications   Medication Sig Start Date End Date Taking? Authorizing Provider  acyclovir (ZOVIRAX) 400 MG tablet Take 400 mg by mouth daily.    Yes Historical Provider, MD  ergocalciferol (VITAMIN D2) 50000 UNITS capsule Take 1 capsule (50,000 Units total) by mouth once a week. 05/19/13  Yes Reyne Dumas, MD  hydrochlorothiazide (HYDRODIURIL) 25 MG tablet Take 1 tablet (25 mg total) by mouth daily. 05/18/13  Yes Reyne Dumas, MD  tadalafil (CIALIS) 20 MG tablet Take 0.5 tablets (10 mg total) by mouth every other day as needed for erectile dysfunction. 08/31/13  Yes Deepak Advani, MD   BP  143/104  Pulse 66  Temp(Src) 98.3 F (36.8 C) (Oral)  Resp 15  Ht 5\' 8"  (1.727 m)  Wt 254 lb (115.214 kg)  BMI 38.63 kg/m2  SpO2 96% Physical Exam  Nursing note and vitals reviewed. Constitutional: He is oriented to person, place, and time. He appears well-developed and well-nourished. No distress.  HENT:  Head: Normocephalic and atraumatic.  Mouth/Throat: Oropharynx is clear and moist. No oropharyngeal exudate.  Eyes: Conjunctivae and EOM are normal. Pupils are equal, round, and reactive to light. Right eye exhibits no discharge. Left eye exhibits no discharge.  Neck: Normal range of motion. Neck supple. No tracheal deviation present.  Cardiovascular: Normal rate, regular rhythm and normal heart sounds.  Exam reveals no friction rub.   No murmur heard. Pulses:      Radial pulses are 2+ on the right side, and 2+ on the left side.       Dorsalis pedis pulses are 2+ on the right side, and 2+ on the left side.  Cap refill less than 3 seconds Negative swelling or pitting edema identified to the lower extremities bilaterally  Negative bruit  Pulmonary/Chest: Effort normal and breath sounds normal. No respiratory distress. He has no wheezes. He has no rales. He exhibits no tenderness.  Patient is able to speak in full sentences without difficulty Negative use of accessory muscles Negative stridor Negative pain upon palpation to the chest wall  Musculoskeletal: Normal range of motion.  Full ROM to upper and lower extremities without difficulty noted, negative ataxia noted.  Lymphadenopathy:    He has no cervical adenopathy.  Neurological: He is alert and oriented to person, place, and time. No cranial nerve deficit. He exhibits normal muscle tone. Coordination normal.  Cranial nerves III-XII grossly intact Strength 5+/5+ to upper and lower extremities bilaterally with resistance applied, equal distribution noted Sensation intact to differentiation sharp and dull touch-equal upper  extremity bilaterally Equal grip strength bilaterally Patient is able to bring finger-to-nose bilaterally without difficulty or ataxia Negative facial droop Negative circumflex speech Negative invasion Patient follows commands well Patient response to questions appropriately Strength intact to MCP, PIP, DIP joints of left hand Negative arm drift Fine motor skills intact  Skin: Skin is warm and dry. No rash noted. He is not diaphoretic. No erythema.  Psychiatric: He has a normal mood and affect. His behavior is normal. Thought content normal.    ED Course  Procedures (including critical care time)  Results for orders placed during the hospital encounter of 12/21/13  CBC WITH DIFFERENTIAL      Result Value Ref Range   WBC 6.7  4.0 - 10.5 K/uL   RBC 5.05  4.22 - 5.81 MIL/uL   Hemoglobin 15.7  13.0 - 17.0 g/dL   HCT 46.6  39.0 - 52.0 %   MCV 92.3  78.0 - 100.0 fL  MCH 31.1  26.0 - 34.0 pg   MCHC 33.7  30.0 - 36.0 g/dL   RDW 14.7  11.5 - 15.5 %   Platelets 290  150 - 400 K/uL   Neutrophils Relative % 54  43 - 77 %   Neutro Abs 3.6  1.7 - 7.7 K/uL   Lymphocytes Relative 34  12 - 46 %   Lymphs Abs 2.3  0.7 - 4.0 K/uL   Monocytes Relative 8  3 - 12 %   Monocytes Absolute 0.5  0.1 - 1.0 K/uL   Eosinophils Relative 3  0 - 5 %   Eosinophils Absolute 0.2  0.0 - 0.7 K/uL   Basophils Relative 1  0 - 1 %   Basophils Absolute 0.0  0.0 - 0.1 K/uL  COMPREHENSIVE METABOLIC PANEL      Result Value Ref Range   Sodium 140  137 - 147 mEq/L   Potassium 4.1  3.7 - 5.3 mEq/L   Chloride 101  96 - 112 mEq/L   CO2 26  19 - 32 mEq/L   Glucose, Bld 105 (*) 70 - 99 mg/dL   BUN 11  6 - 23 mg/dL   Creatinine, Ser 1.06  0.50 - 1.35 mg/dL   Calcium 9.9  8.4 - 10.5 mg/dL   Total Protein 7.4  6.0 - 8.3 g/dL   Albumin 3.8  3.5 - 5.2 g/dL   AST 14  0 - 37 U/L   ALT 17  0 - 53 U/L   Alkaline Phosphatase 70  39 - 117 U/L   Total Bilirubin 0.3  0.3 - 1.2 mg/dL   GFR calc non Af Amer 80 (*) >90 mL/min    GFR calc Af Amer >90  >90 mL/min   Anion gap 13  5 - 15  TROPONIN I      Result Value Ref Range   Troponin I <0.30  <0.30 ng/mL    Labs Review Labs Reviewed  COMPREHENSIVE METABOLIC PANEL - Abnormal; Notable for the following:    Glucose, Bld 105 (*)    GFR calc non Af Amer 80 (*)    All other components within normal limits  CBC WITH DIFFERENTIAL  TROPONIN I  D-DIMER, QUANTITATIVE    Imaging Review Dg Chest 2 View  12/21/2013   CLINICAL DATA:  Chest pain. Pain from lifting weights 2-3 weeks ago .  EXAM: CHEST  2 VIEW  COMPARISON:  04/07/2012.  FINDINGS: Mediastinum hilar structures are normal. Lungs are clear. Mild cardiomegaly. No CHF. No pleural effusion or pneumothorax. No acute bony abnormality.  IMPRESSION: 1. Mild cardiomegaly, no CHF. 2. No acute pulmonary disease.   Electronically Signed   By: Marcello Moores  Register   On: 12/21/2013 13:07     EKG Interpretation   Date/Time:  Tuesday December 21 2013 10:39:19 EDT Ventricular Rate:  87 PR Interval:  144 QRS Duration: 72 QT Interval:  352 QTC Calculation: 423 R Axis:   62 Text Interpretation:  Normal sinus rhythm Normal ECG Confirmed by WARD,   DO, KRISTEN ([id number]) on 12/21/2013 11:52:36 AM     12:54 PM This provider spoke with Cardiology, Anda Kraft - discussed history, case, labs, vitals. Cardiology to consult.   1:16 PM This provider spoke with Dr. Curly Rim - discussed case, labs, imaging, vitals, ED course in great detail. Patient to be admitted to Telemetry floor for observation for cardiac rule out.   MDM   Final diagnoses:  Chest pain  Medical non-compliance  Medications  aspirin chewable tablet 324 mg (324 mg Oral Given 12/21/13 1213)  sodium chloride 0.9 % bolus 500 mL (0 mLs Intravenous Stopped 12/21/13 1345)    Filed Vitals:   12/21/13 1145 12/21/13 1200 12/21/13 1230 12/21/13 1245  BP: 132/88 121/85 135/98 143/104  Pulse: 59 74 82 66  Temp:      TempSrc:      Resp: 12 16 29 15   Height:       Weight:      SpO2: 96% 94% 98% 96%   EKG noted normal sinus rhythm with a heart rate of 87 bpm. Troponin negative elevation. CBC unremarkable. CMP unremarkable. Chest xray negative for acute cardiopulmonary disease.  Doubt PE. Doubt dissection. Doubt pneumonia. Patient has high risk factors regarding being poorly medical complaint, HTN, obese. Patient to be admitted for cardiac rule out. Cardiology to consult. Patient admitted to [facility]. Patient agrees to plan of care. Patient stable for transfer.   Jamse Mead, PA-C 12/21/13 1348

## 2013-12-21 NOTE — Consult Note (Addendum)
Name: Samuel Irwin is a 54 y.o. male Admit date: 12/21/2013 Referring Physician:  Bonnielee Haff, MD Primary Physician:  D. Advani, M.D. Primary Cardiologist:  None  Reason for Consultation:  Chest pain  ASSESSMENT: 1. Chest pain, atypical. 2. Elevated blood pressure and probable hypertension 3. Neck pain, musculoskeletal 4. Obstructive sleep apnea 5. Hyperlipidemia 6. Metabolic syndrome  PLAN:  1. Echocardiogram to rule out pericardial disease and hypertension related LVH. 2. Serial cardiac markers to exclude injury 3. Repeat EKG in a.m. 4. I do not believe this history represents symptoms that ischemia related. Multiple risk factors I have are concerning. Depending upon course, we may consider a stress nuclear study in a.m.  5. Aggressive blood pressure control   HPI: 54 year old with a 3 day history history of chest pain described as sharp pain in the left lateral chest that at times her positional. There is no exertional component. There is no dyspnea or diaphoresis. Additionally the patient has had left neck and shoulder discomfort at different times. He equates the neck discomfort with taking a strong draw on a cigarette. He denies orthopnea, PND, palpitations, syncope, edema, and history of heart disease. He states of the bed he sleeps on his very uncomfortable but became concerned about the combination of neck or arm and chest discomfort. He spoke to a cousin who is a Marine scientist and Mildred who advised him to come to the emergency room. He smokes cigarettes intermittently. He has not a daily smoker. He drinks alcohol occasionally but not daily. He was first told his blood pressure was elevated more than 10 years ago. He has only recently started blood pressure medication.  PMH:   Past Medical History  Diagnosis Date  . GERD (gastroesophageal reflux disease)   . Umbilical hernia   . Constipation   . Cough   . Wheezing   . Weakness   . Generalized headaches   . Heart  murmur   . Shortness of breath     occ exersion  . Pneumonia     hx  . Sinusitis   . H/O hiatal hernia     PSH:   Past Surgical History  Procedure Laterality Date  . Femur surgery      left  . Toe surgery      dislocated rt foot  . Carpal tunnel release      R hand  . Umbilical hernia repair N/A 04/07/2012    Procedure: HERNIA REPAIR UMBILICAL ADULT;  Surgeon: Madilyn Hook, DO;  Location: [place];  Service: General;  Laterality: N/A;  open umbilical hernia repair with mesh  . Insertion of mesh N/A 04/07/2012    Procedure: INSERTION OF MESH;  Surgeon: Madilyn Hook, DO;  Location: [place];  Service: General;  Laterality: N/A;  . Hernia repair  04/07/12    umb hernia repair   Allergies:  Review of patient's allergies indicates no known allergies. Prior to Admit Meds:   Prescriptions prior to admission  Medication Sig Dispense Refill  . acyclovir (ZOVIRAX) 400 MG tablet Take 400 mg by mouth daily.       . ergocalciferol (VITAMIN D2) 50000 UNITS capsule Take 1 capsule (50,000 Units total) by mouth once a week.  12 capsule  2  . hydrochlorothiazide (HYDRODIURIL) 25 MG tablet Take 1 tablet (25 mg total) by mouth daily.  90 tablet  3  . tadalafil (CIALIS) 20 MG tablet Take 0.5 tablets (10 mg total) by mouth every other day as needed for erectile dysfunction.  30 tablet  3   Fam HX:    Family History  Problem Relation Age of Onset  . Cancer Father     lung   Social HX:    History   Social History  . Marital Status: Single    Spouse Name: N/A    Number of Children: N/A  . Years of Education: N/A   Occupational History  . unemployed    Social History Main Topics  . Smoking status: Current Some Day Smoker -- 15 years    Types: Cigarettes    Last Attempt to Quit: 02/26/2013  . Smokeless tobacco: Not on file     Comment: Pt reports quit 02/26/13-- only smoked with alcohol  . Alcohol Use: Yes     Comment: 3 40oz beers twice a week  . Drug Use: No  . Sexual Activity: Not on file      Comment: "when I drink"   Other Topics Concern  . Not on file   Social History Narrative  . No narrative on file     Review of Systems: No history of stroke, CHF, heart murmur, kidney disease, asthma or COPD, and denies ulcer disease.  Physical Exam: Blood pressure 148/99, pulse 69, temperature 97.8 F (36.6 C), temperature source Oral, resp. rate 18, height 5\' 8"  (1.727 m), weight 251 lb 12.8 oz (114.216 kg), SpO2 96.00%. Weight change:    Moderate obesity, anxious and demeanor, but in no distress. Neck exam reveals no JVD or carotid bruits Cardiac exam is normal. No gallop, rub, murmur, or click is heard. Chest is clear to auscultation and percussion Abdomen is soft. Normal bowel sounds are heard. No bruits are present. Extremities reveal no edema. Neurological exam is normal.  Labs: Lab Results  Component Value Date   WBC 6.7 12/21/2013   HGB 15.7 12/21/2013   HCT 46.6 12/21/2013   MCV 92.3 12/21/2013   PLT 290 12/21/2013    Recent Labs Lab 12/21/13 1110  NA 140  K 4.1  CL 101  CO2 26  BUN 11  CREATININE 1.06  CALCIUM 9.9  PROT 7.4  BILITOT 0.3  ALKPHOS 70  ALT 17  AST 14  GLUCOSE 105*   No results found for this basename: PTT   Lab Results  Component Value Date   INR 0.90 12/16/2010   Lab Results  Component Value Date   TROPONINI <0.30 12/21/2013     Lab Results  Component Value Date   CHOL 213* 05/18/2013   CHOL 201* 03/20/2012   Lab Results  Component Value Date   HDL 53 05/18/2013   HDL 45 03/20/2012   Lab Results  Component Value Date   LDLCALC 111* 05/18/2013   LDLCALC UNABLE TO CALCULATE IF TRIGLYCERIDE OVER 400 mg/dL 03/20/2012   Lab Results  Component Value Date   TRIG 243* 05/18/2013   TRIG 422* 03/20/2012   Lab Results  Component Value Date   CHOLHDL 4.0 05/18/2013   CHOLHDL 4.5 03/20/2012   No results found for this basename: LDLDIRECT      Radiology:  Dg Chest 2 View  12/21/2013   CLINICAL DATA:  Chest pain. Pain from  lifting weights 2-3 weeks ago .  EXAM: CHEST  2 VIEW  COMPARISON:  04/07/2012.  FINDINGS: Mediastinum hilar structures are normal. Lungs are clear. Mild cardiomegaly. No CHF. No pleural effusion or pneumothorax. No acute bony abnormality.  IMPRESSION: 1. Mild cardiomegaly, no CHF. 2. No acute pulmonary disease.   Electronically Signed  By: [place]   On: 12/21/2013 13:07    EKG:  Normal sinus rhythm and normal appearance. Resolution of the lateral precordial T-wave inversion compared to 2014.    Sinclair Grooms 12/21/2013 3:17 PM

## 2013-12-22 ENCOUNTER — Observation Stay (HOSPITAL_COMMUNITY): Payer: Self-pay

## 2013-12-22 ENCOUNTER — Encounter (HOSPITAL_COMMUNITY): Payer: Self-pay | Admitting: Physician Assistant

## 2013-12-22 DIAGNOSIS — R072 Precordial pain: Secondary | ICD-10-CM

## 2013-12-22 DIAGNOSIS — I369 Nonrheumatic tricuspid valve disorder, unspecified: Secondary | ICD-10-CM

## 2013-12-22 LAB — LIPID PANEL
Cholesterol: 197 mg/dL (ref 0–200)
HDL: 46 mg/dL (ref >39)
LDL Cholesterol: 113 mg/dL — ABNORMAL HIGH (ref 0–99)
TRIGLYCERIDES: 191 mg/dL — AB (ref <150)
Total CHOL/HDL Ratio: 4.3 RATIO
VLDL: 38 mg/dL (ref 0–40)

## 2013-12-22 LAB — COMPREHENSIVE METABOLIC PANEL
ALK PHOS: 64 U/L (ref 39–117)
ALT: 17 U/L (ref 0–53)
ANION GAP: 14 (ref 5–15)
AST: 12 U/L (ref 0–37)
Albumin: 3.6 g/dL (ref 3.5–5.2)
BILIRUBIN TOTAL: 0.4 mg/dL (ref 0.3–1.2)
BUN: 11 mg/dL (ref 6–23)
CHLORIDE: 101 meq/L (ref 96–112)
CO2: 26 meq/L (ref 19–32)
Calcium: 9.9 mg/dL (ref 8.4–10.5)
Creatinine, Ser: 1.08 mg/dL (ref 0.50–1.35)
GFR calc Af Amer: >90 mL/min (ref >90)
GFR calc non Af Amer: 78 mL/min — ABNORMAL LOW (ref >90)
GLUCOSE: 100 mg/dL — AB (ref 70–99)
Potassium: 4 mEq/L (ref 3.7–5.3)
Sodium: 141 mEq/L (ref 137–147)
Total Protein: 7.4 g/dL (ref 6.0–8.3)

## 2013-12-22 LAB — CBC
HEMATOCRIT: 46.7 % (ref 39.0–52.0)
Hemoglobin: 15.7 g/dL (ref 13.0–17.0)
MCH: 31.1 pg (ref 26.0–34.0)
MCHC: 33.6 g/dL (ref 30.0–36.0)
MCV: 92.5 fL (ref 78.0–100.0)
Platelets: 300 10*3/uL (ref 150–400)
RBC: 5.05 MIL/uL (ref 4.22–5.81)
RDW: 14.7 % (ref 11.5–15.5)
WBC: 7.1 10*3/uL (ref 4.0–10.5)

## 2013-12-22 LAB — TROPONIN I: Troponin I: <0.3 ng/mL (ref <0.30)

## 2013-12-22 MED ORDER — TECHNETIUM TC 99M SESTAMIBI GENERIC - CARDIOLITE
10.0000 | Freq: Once | INTRAVENOUS | Status: AC | PRN
  Administered 2013-12-22: 10 via INTRAVENOUS

## 2013-12-22 MED ORDER — ASPIRIN 81 MG PO TBEC: 81.0000 mg | DELAYED_RELEASE_TABLET | Freq: Every day | ORAL | Status: DC

## 2013-12-22 MED ORDER — TECHNETIUM TC 99M SESTAMIBI GENERIC - CARDIOLITE
30.0000 | Freq: Once | INTRAVENOUS | Status: AC | PRN
  Administered 2013-12-22: 30 via INTRAVENOUS

## 2013-12-22 MED ORDER — ACETAMINOPHEN 325 MG PO TABS
ORAL_TABLET | ORAL | Status: AC
  Filled 2013-12-22: qty 2

## 2013-12-22 MED ORDER — ATORVASTATIN CALCIUM 40 MG PO TABS: 40.0000 mg | ORAL_TABLET | Freq: Every day | ORAL | Status: DC

## 2013-12-22 NOTE — Progress Notes (Signed)
  Echocardiogram 2D Echocardiogram has been performed.  Darlina Sicilian M 12/22/2013, 2:06 PM

## 2013-12-22 NOTE — Progress Notes (Signed)
Patient being discharged to home. Dc tele, DC IV. All discharge instructions explained. Patient verbalizes understanding.

## 2013-12-22 NOTE — Discharge Summary (Signed)
Physician Discharge Summary  Samuel Irwin WUX:[id number] DOB: [birth year] DOA: 12/21/2013  PCP: Lorayne Marek, MD  Admit date: 12/21/2013 Discharge date: 12/22/2013  Time spent: 35 minutes  Recommendations for Outpatient Follow-up:  1. Follow up with PCP in 2-4 weeks.  Discharge Diagnoses:  Active Problems:   OSA (obstructive sleep apnea)   Metabolic syndrome   Essential hypertension, benign   Dyslipidemia   Chest pain   Discharge Condition: stable  Diet recommendation: heart halethy  Filed Weights   12/21/13 1038 12/21/13 1416 12/22/13 0426  Weight: 115.214 kg (254 lb) 114.216 kg (251 lb 12.8 oz) 113.944 kg (251 lb 3.2 oz)    History of present illness:  53 y.o. male with a past medical history of hypertension who was in his usual state of health until Saturday when he started experiencing pain in the left side of his chest. Patient is a poor historian. He was unable to describe his symptoms. He tells me he experienced sharp pain. It was irrespective of his activity level. And then he would tell me that when he would move around and change his body position he would feel some pain. But this is not consistent. There was no radiation of this pain. He denies any diaphoresis. It would increase with deep breathing. But denies any shortness of breath per se. At worst it was 5 out of 10. Currently denies any chest pain. He hasn't had any nausea, vomiting. No falls or injuries. No cough. No fever, no chills. He does get lower leg swelling occasionally. He said he had similar pain about 2 years ago. Denies having had any cardiac testing at that time except for an EKG. Occasionally gets heartburns, but not on a daily basis. Overall, not a very good historian. He would change his story quite often.   Hospital Course:  Chest pain: - Pt's relates he had been lifting weight the week prior. It is mildly reproducible by palaption. - Troponin neg x3 nuclear stress test negative for inducible  ischemia. - 2D echo to be follow as an outpatient.  Essential hypertension:  - Monitor blood pressures closely.  - He take HCTZ at home   History of dyslipidemia:  - LDL 119, TG high. - Advise to eat low carb diet.  Obstructive sleep apnea:  - CPAP every night.    Procedures:  Echo pending to follow as an outpatient  Consultations:  cardiology  Discharge Exam: Filed Vitals:   12/22/13 1430  BP: 133/97  Pulse: 78  Temp:   Resp:     General: A&O x3 Cardiovascular: RRR Respiratory: good air movement CTA B/L  Discharge Instructions You were cared for by a hospitalist during your hospital stay. If you have any questions about your discharge medications or the care you received while you were in the hospital after you are discharged, you can call the unit and asked to speak with the hospitalist on call if the hospitalist that took care of you is not available. Once you are discharged, your primary care physician will handle any further medical issues. Please note that NO REFILLS for any discharge medications will be authorized once you are discharged, as it is imperative that you return to your primary care physician (or establish a relationship with a primary care physician if you do not have one) for your aftercare needs so that they can reassess your need for medications and monitor your lab values.  Discharge Instructions   Diet - low sodium heart healthy  Complete by:  As directed      Increase activity slowly    Complete by:  As directed           Current Discharge Medication List    START taking these medications   Details  aspirin EC 81 MG EC tablet Take 1 tablet (81 mg total) by mouth daily.      CONTINUE these medications which have NOT CHANGED   Details  acyclovir (ZOVIRAX) 400 MG tablet Take 400 mg by mouth daily.     ergocalciferol (VITAMIN D2) 50000 UNITS capsule Take 1 capsule (50,000 Units total) by mouth once a week. Qty: 12 capsule, Refills: 2     hydrochlorothiazide (HYDRODIURIL) 25 MG tablet Take 1 tablet (25 mg total) by mouth daily. Qty: 90 tablet, Refills: 3    tadalafil (CIALIS) 20 MG tablet Take 0.5 tablets (10 mg total) by mouth every other day as needed for erectile dysfunction. Qty: 30 tablet, Refills: 3       No Known Allergies Follow-up Information   Follow up with Lorayne Marek, MD In 2 weeks. (hospital follow up)    Specialty:  Internal Medicine   Contact information:   [place] [id number] [phone]        The results of significant diagnostics from this hospitalization (including imaging, microbiology, ancillary and laboratory) are listed below for reference.    Significant Diagnostic Studies: Dg Chest 2 View  12/21/2013   CLINICAL DATA:  Chest pain. Pain from lifting weights 2-3 weeks ago .  EXAM: CHEST  2 VIEW  COMPARISON:  04/07/2012.  FINDINGS: Mediastinum hilar structures are normal. Lungs are clear. Mild cardiomegaly. No CHF. No pleural effusion or pneumothorax. No acute bony abnormality.  IMPRESSION: 1. Mild cardiomegaly, no CHF. 2. No acute pulmonary disease.   Electronically Signed   By: Marcello Moores  Register   On: 12/21/2013 13:07   Nm Myocar Multi W/spect W/wall Motion / Ef  12/22/2013   CLINICAL DATA:  Chest pain.  EXAM: MYOCARDIAL IMAGING WITH SPECT (REST AND EXERCISE)  GATED LEFT VENTRICULAR WALL MOTION STUDY  LEFT VENTRICULAR EJECTION FRACTION  TECHNIQUE: Standard myocardial SPECT imaging was performed after resting intravenous injection of 10 mCi Tc-13m sestamibi. Subsequently, exercise tolerance test was performed by the patient under the supervision of the Cardiology staff. At peak-stress, 30 mCi Tc-20m sestamibi was injected intravenously and standard myocardial SPECT imaging was performed. Quantitative gated imaging was also performed to evaluate left ventricular wall motion, and estimate left ventricular ejection fraction.  COMPARISON:  None.  FINDINGS: Perfusion:  Inferior wall scar. No reversible defects to suggest ischemia.  Wall Motion: Wall motion abnormality in the inferior wall.  Left Ventricular Ejection Fraction: 56 %  End diastolic volume 413 ml  End systolic volume 45 ml  IMPRESSION: 1. Inferior wall scar.  No findings for ischemia.  2. Inferior wall motion abnormality.  3. Left ventricular ejection fraction 56%  4. Low-risk stress test findings*.  *2012 Appropriate Use Criteria for Coronary Revascularization Focused Update: J Am Coll Cardiol. 2440;10(2):725-366. http://content.airportbarriers.com.aspx?articleid=1201161   Electronically Signed   By: Kalman Jewels M.D.   On: 12/22/2013 15:27    Microbiology: No results found for this or any previous visit (from the past 240 hour(s)).   Labs: Basic Metabolic Panel:  Recent Labs Lab 12/21/13 1110 12/22/13 0538  NA 140 141  K 4.1 4.0  CL 101 101  CO2 26 26  GLUCOSE 105* 100*  BUN 11 11  CREATININE 1.06 1.08  CALCIUM 9.9 9.9   Liver Function Tests:  Recent Labs Lab 12/21/13 1110 12/22/13 0538  AST 14 12  ALT 17 17  ALKPHOS 70 64  BILITOT 0.3 0.4  PROT 7.4 7.4  ALBUMIN 3.8 3.6   No results found for this basename: LIPASE, AMYLASE,  in the last 168 hours No results found for this basename: AMMONIA,  in the last 168 hours CBC:  Recent Labs Lab 12/21/13 1110 12/22/13 0538  WBC 6.7 7.1  NEUTROABS 3.6  --   HGB 15.7 15.7  HCT 46.6 46.7  MCV 92.3 92.5  PLT 290 300   Cardiac Enzymes:  Recent Labs Lab 12/21/13 1110 12/21/13 1405 12/21/13 2043 12/22/13 0538  TROPONINI <0.30 <0.30 <0.30 <0.30   BNP: BNP (last 3 results) No results found for this basename: PROBNP,  in the last 8760 hours CBG: No results found for this basename: GLUCAP,  in the last 168 hours     Signed:  Charlynne Cousins  [facility] 12/22/2013, 4:20 PM

## 2013-12-22 NOTE — Progress Notes (Signed)
Patient set up on CPAP via nasal mask, auto titrate settings.  Patient tolerating well at this time.

## 2013-12-22 NOTE — Progress Notes (Signed)
UR completed 

## 2013-12-22 NOTE — Progress Notes (Signed)
Patient Name: Samuel Irwin Date of Encounter: 12/22/2013     Active Problems:   OSA (obstructive sleep apnea)   Metabolic syndrome   Essential hypertension, benign   Dyslipidemia   Chest pain    SUBJECTIVE   Seen in nuclear medicine for ett nuclear stress test. No chest pain but does have some neck and back pain when turns his neck. No SOB. Tolerated ETT stress tests well.   CURRENT MEDS . acetaminophen      . acyclovir  400 mg Oral Daily  . aspirin EC  81 mg Oral Daily  . enoxaparin (LOVENOX) injection  40 mg Subcutaneous Q24H  . folic acid  1 mg Oral Daily  . Influenza vac split quadrivalent PF  0.5 mL Intramuscular Tomorrow-1000  . multivitamin with minerals  1 tablet Oral Daily  . pantoprazole  40 mg Oral Q1200  . pneumococcal 23 valent vaccine  0.5 mL Intramuscular Tomorrow-1000  . sodium chloride  3 mL Intravenous Q12H  . sodium chloride  3 mL Intravenous Q12H  . thiamine  100 mg Oral Daily   Or  . thiamine  100 mg Intravenous Daily    OBJECTIVE  Filed Vitals:   12/22/13 0003 12/22/13 0022 12/22/13 0426 12/22/13 0931  BP: 124/82  120/78 124/88  Pulse: 77 96 73   Temp:   98.3 F (36.8 C)   TempSrc:   Oral   Resp: 18 18 18    Height:      Weight:   251 lb 3.2 oz (113.944 kg)   SpO2: 94% 96% 98%     Intake/Output Summary (Last 24 hours) at 12/22/13 0958 Last data filed at 12/22/13 0917  Gross per 24 hour  Intake    240 ml  Output    200 ml  Net     40 ml   Filed Weights   12/21/13 1038 12/21/13 1416 12/22/13 0426  Weight: 254 lb (115.214 kg) 251 lb 12.8 oz (114.216 kg) 251 lb 3.2 oz (113.944 kg)    PHYSICAL EXAM  General: Pleasant, NAD. obese Neuro: Alert and oriented X 3. Moves all extremities spontaneously. Psych: Normal affect. HEENT:  Normal  Neck: Supple without bruits or JVD. Lungs:  Resp regular and unlabored, CTA. Heart: RRR no s3, s4, or murmurs. Abdomen: Soft, non-tender, non-distended, BS + x 4.  Extremities: No clubbing,  cyanosis or edema. DP/PT/Radials 2+ and equal bilaterally.  Accessory Clinical Findings  CBC  Recent Labs  12/21/13 1110 12/22/13 0538  WBC 6.7 7.1  NEUTROABS 3.6  --   HGB 15.7 15.7  HCT 46.6 46.7  MCV 92.3 92.5  PLT 290 267   Basic Metabolic Panel  Recent Labs  12/21/13 1110 12/22/13 0538  NA 140 141  K 4.1 4.0  CL 101 101  CO2 26 26  GLUCOSE 105* 100*  BUN 11 11  CREATININE 1.06 1.08  CALCIUM 9.9 9.9   Liver Function Tests  Recent Labs  12/21/13 1110 12/22/13 0538  AST 14 12  ALT 17 17  ALKPHOS 70 64  BILITOT 0.3 0.4  PROT 7.4 7.4  ALBUMIN 3.8 3.6    Cardiac Enzymes  Recent Labs  12/21/13 1405 12/21/13 2043 12/22/13 0538  TROPONINI <0.30 <0.30 <0.30    D-Dimer  Recent Labs  12/21/13 1337  DDIMER 0.29    Thyroid Function Tests  Recent Labs  12/21/13 1430  TSH 1.700    TELE  NSR  Radiology/Studies  Dg Chest 2 View  12/21/2013  CLINICAL DATA:  Chest pain. Pain from lifting weights 2-3 weeks ago .  EXAM: CHEST  2 VIEW  COMPARISON:  04/07/2012.  FINDINGS: Mediastinum hilar structures are normal. Lungs are clear. Mild cardiomegaly. No CHF. No pleural effusion or pneumothorax. No acute bony abnormality.  IMPRESSION: 1. Mild cardiomegaly, no CHF. 2. No acute pulmonary disease.   Electronically Signed   By: Marcello Moores  Register   On: 12/21/2013 13:07    ASSESSMENT AND PLAN ARMON ORVIS is a 53 y.o. male with a past medical history of obesity, tobacco abuse, hypertension and no prior cardiac history who was admitted yesterday with atypical chest pain.   Chest pain- atypical.  -- Troponin neg x2 -- Await images from Ett nuclear stress test today. He walked 10 min 27 sec on Bruce protocol with no acute ST or TW changes. He reached target HR of 143.  -- 2D echo pending   Essential hypertension: Monitor blood pressures closely. -- He take HCTZ at home   History of dyslipidemia: Lipid panel pending  Obstructive sleep apnea: CPAP  every night.   Alcohol use: He was asked to cut back on his alcohol intake. -- On Ativan and Thiamine.  Tobacco abuse- counseled on cessation   Signed, Eileen Stanford PA-C  Pager [phone]  I have personally seen and examined this patient with Randal Buba. I agree with the assessment and plan as outlined above. Chest pain is atypical. Cardiac markers normal. EKG reviewed by me. No ischemia. Stress test today to exclude ischemia. Echo later today. We will follow with you.   Danya Spearman 12/22/2013 12:52 PM

## 2014-02-09 ENCOUNTER — Telehealth: Payer: Self-pay | Admitting: Pulmonary Disease

## 2014-02-09 NOTE — Telephone Encounter (Signed)
lmomtcb x1 

## 2014-02-09 NOTE — Telephone Encounter (Signed)
Went out and spoke with pt and he stated that he had to turn his cpap in about 3-4 weeks ago.  He stated that he has not been able to tolerate any of the settings that he has been on and he has changed his mask as well.  He stated that he is trying to get some of his weight  Off and is at the gym doing this.  He wanted to see what the next step will be.  He feels he would like to try the higher setting to see if this works.  [place] please advise.  We can call the pt back at [phone]

## 2014-02-09 NOTE — Telephone Encounter (Signed)
Did he turn in machine because he wanted to, or was it because he wasn't using and DME took away??

## 2014-02-10 NOTE — Telephone Encounter (Signed)
LMTCB

## 2014-02-11 ENCOUNTER — Telehealth: Payer: Self-pay | Admitting: Pulmonary Disease

## 2014-02-11 NOTE — Telephone Encounter (Signed)
Pt reports he turned the machine in on his own since he was not able to tolerate the pressure. Please advise thanks

## 2014-02-11 NOTE — Telephone Encounter (Signed)
Letter received and placed in [place] green look at folder. He will be back Monday to review the letter.  Called pt and LM advising the above. Please advise [place] thanks

## 2014-02-14 ENCOUNTER — Ambulatory Visit (INDEPENDENT_AMBULATORY_CARE_PROVIDER_SITE_OTHER): Payer: Self-pay | Admitting: Pulmonary Disease

## 2014-02-14 ENCOUNTER — Encounter: Payer: Self-pay | Admitting: Pulmonary Disease

## 2014-02-14 ENCOUNTER — Other Ambulatory Visit: Payer: Self-pay | Admitting: Pulmonary Disease

## 2014-02-14 VITALS — BP 124/78 | HR 79 | Temp 97.7°F | Ht 68.0 in | Wt 260.0 lb

## 2014-02-14 DIAGNOSIS — G4733 Obstructive sleep apnea (adult) (pediatric): Secondary | ICD-10-CM

## 2014-02-14 NOTE — Telephone Encounter (Signed)
Pt being seen in office today. Will have [place] review/address the letter.

## 2014-02-14 NOTE — Assessment & Plan Note (Signed)
The patient has been intolerant of C Pap because of pressure and mask issues. He has now found a mask that works well for him, and we just need to work on a different pressure delivery system. I would like to switch him to bilevel, which I think will be much more comfortable for him. I will see if we can get [facility] to switch his C Pap device to a bilevel device. Will also arrange for a titration study at the sleep Center to optimize his pressure. I have also discussed with him the option of a tracheostomy, which is plugged during the day and opened at night while sleeping. This will treat his sleep apnea 100%. Finally, I have encouraged him to work aggressively on weight loss.

## 2014-02-14 NOTE — Telephone Encounter (Signed)
Called and spoke to pt. Informed pt of the recs per [facility]. Appt made for today (02/14/14) at 3:45. Pt verbalized understanding and denied any further questions or concerns at this time.

## 2014-02-14 NOTE — Patient Instructions (Signed)
Will try to switch your cpap machine to a bipap machine thru [facility]. Will schedule you for a bipap titration study at the sleep center to optimize your pressure. Work on [name] loss Will arrange followup with me once your sleep study is done.

## 2014-02-14 NOTE — Progress Notes (Signed)
   Subjective:    Patient ID: Samuel Irwin, male    DOB: 1960-09-02, 53 y.o.   MRN: [id number]  HPI The patient comes in today for follow-up of his known severe obstructive sleep apnea. He has been started on CPAP, and pressure has been adjusted. He has had a difficult time tolerating the device, and he feels this is primarily because of the pressure. He has never tried a bilevel machine.   Review of Systems  Constitutional: Negative for fever and unexpected weight change.  HENT: Negative for congestion, dental problem, ear pain, nosebleeds, postnasal drip, rhinorrhea, sinus pressure, sneezing, sore throat and trouble swallowing.   Eyes: Negative for redness and itching.  Respiratory: Negative for cough, chest tightness, shortness of breath and wheezing.   Cardiovascular: Negative for palpitations and leg swelling.  Gastrointestinal: Negative for nausea and vomiting.  Genitourinary: Negative for dysuria.  Musculoskeletal: Negative for joint swelling.  Skin: Negative for rash.  Neurological: Negative for headaches.  Hematological: Does not bruise/bleed easily.  Psychiatric/Behavioral: Negative for dysphoric mood. The patient is not nervous/anxious.        Objective:   Physical Exam Obese male in no acute distress Nose without purulence or discharge noted No skin breakdown or pressure necrosis from the C Pap mask Neck without lymphadenopathy or thyromegaly Lower extremities with mild edema, no cyanosis Alert and oriented, moves all 4 extremities.       Assessment & Plan:

## 2014-02-14 NOTE — Telephone Encounter (Signed)
Pt needs ov to discuss all of this.  He is not eligible for disability for sleep apnea because there are treatments available that completely treat sleep apnea.  It is not an untreatable disease.

## 2014-02-21 ENCOUNTER — Telehealth: Payer: Self-pay | Admitting: Pulmonary Disease

## 2014-02-21 NOTE — Telephone Encounter (Signed)
lmtcb for pt.  

## 2014-02-22 NOTE — Telephone Encounter (Signed)
ATC x 2 line busy, wcb

## 2014-02-23 NOTE — Telephone Encounter (Signed)
Called and spoke with pt and he stated that [facility] called to let him know that he could come in and pick up the bipap.  Pt called and wanted to know if we wanted him to go and pick this up.  I advised the pt that if the bi pap is ready then he can pick this up from [facility]  Nothing further is needed.

## 2014-03-04 ENCOUNTER — Other Ambulatory Visit: Payer: Self-pay | Admitting: Internal Medicine

## 2014-03-04 NOTE — Telephone Encounter (Signed)
Patient called to request medication refills for all of his current medications, patient has an office visit on 03/09/2014. Please f/u with pt.

## 2014-03-04 NOTE — Telephone Encounter (Signed)
Pt requested Vit D refills Advice to continue taking OTC Vit D no need for Refill

## 2014-03-06 ENCOUNTER — Ambulatory Visit (HOSPITAL_BASED_OUTPATIENT_CLINIC_OR_DEPARTMENT_OTHER): Payer: Self-pay | Attending: Pulmonary Disease

## 2014-03-06 VITALS — Ht 68.0 in | Wt 248.0 lb

## 2014-03-06 DIAGNOSIS — G4733 Obstructive sleep apnea (adult) (pediatric): Secondary | ICD-10-CM | POA: Insufficient documentation

## 2014-03-09 ENCOUNTER — Telehealth: Payer: Self-pay | Admitting: Pulmonary Disease

## 2014-03-09 ENCOUNTER — Ambulatory Visit: Payer: Self-pay | Attending: Internal Medicine | Admitting: Internal Medicine

## 2014-03-09 ENCOUNTER — Encounter: Payer: Self-pay | Admitting: Internal Medicine

## 2014-03-09 VITALS — BP 119/86 | HR 76 | Temp 98.0°F | Resp 16 | Wt 252.0 lb

## 2014-03-09 DIAGNOSIS — Z79899 Other long term (current) drug therapy: Secondary | ICD-10-CM | POA: Insufficient documentation

## 2014-03-09 DIAGNOSIS — M25511 Pain in right shoulder: Secondary | ICD-10-CM

## 2014-03-09 DIAGNOSIS — Z7982 Long term (current) use of aspirin: Secondary | ICD-10-CM | POA: Insufficient documentation

## 2014-03-09 DIAGNOSIS — I10 Essential (primary) hypertension: Secondary | ICD-10-CM

## 2014-03-09 DIAGNOSIS — F17210 Nicotine dependence, cigarettes, uncomplicated: Secondary | ICD-10-CM | POA: Insufficient documentation

## 2014-03-09 DIAGNOSIS — K219 Gastro-esophageal reflux disease without esophagitis: Secondary | ICD-10-CM | POA: Insufficient documentation

## 2014-03-09 DIAGNOSIS — Z1211 Encounter for screening for malignant neoplasm of colon: Secondary | ICD-10-CM

## 2014-03-09 DIAGNOSIS — E785 Hyperlipidemia, unspecified: Secondary | ICD-10-CM

## 2014-03-09 LAB — COMPLETE METABOLIC PANEL WITH GFR
ALBUMIN: 4.3 g/dL (ref 3.5–5.2)
ALT: 18 U/L (ref 0–53)
AST: 14 U/L (ref 0–37)
Alkaline Phosphatase: 53 U/L (ref 39–117)
BUN: 15 mg/dL (ref 6–23)
CALCIUM: 10.2 mg/dL (ref 8.4–10.5)
CHLORIDE: 101 meq/L (ref 96–112)
CO2: 27 mEq/L (ref 19–32)
Creat: 1.14 mg/dL (ref 0.50–1.35)
GFR, Est African American: 86 mL/min
GFR, Est Non African American: 74 mL/min
Glucose, Bld: 94 mg/dL (ref 70–99)
POTASSIUM: 4 meq/L (ref 3.5–5.3)
SODIUM: 139 meq/L (ref 135–145)
Total Bilirubin: 0.6 mg/dL (ref 0.2–1.2)
Total Protein: 7.6 g/dL (ref 6.0–8.3)

## 2014-03-09 LAB — LIPID PANEL
CHOLESTEROL: 185 mg/dL (ref 0–200)
HDL: 44 mg/dL (ref >39)
LDL Cholesterol: 105 mg/dL — ABNORMAL HIGH (ref 0–99)
TRIGLYCERIDES: 182 mg/dL — AB (ref <150)
Total CHOL/HDL Ratio: 4.2 Ratio
VLDL: 36 mg/dL (ref 0–40)

## 2014-03-09 MED ORDER — IBUPROFEN 600 MG PO TABS: 600.0000 mg | ORAL_TABLET | Freq: Three times a day (TID) | ORAL | Status: DC | PRN

## 2014-03-09 NOTE — Progress Notes (Signed)
Patient here for routine physical and referral to GI for colonoscopy

## 2014-03-09 NOTE — Patient Instructions (Signed)

## 2014-03-09 NOTE — Telephone Encounter (Signed)
Called spoke with pt. He was set up with BIPAP on Friday and had bipap titration study done on Sunday. He wants to know if his settings needs to be adjusted. I made him aware once [place] reviews results we will let him know if changes needs to be made but in the meantime he is still to wear BIPAP. He would like a call as soon as results are reviewed. Please advise [place] thanks

## 2014-03-09 NOTE — Progress Notes (Signed)
MRN: [id number] Name: Samuel Irwin  Sex: male Age: 54 y.o. DOB: May 15, 1960  Allergies: Review of patient's allergies indicates no known allergies.  Chief Complaint  Patient presents with  . Follow-up    HPI: Patient is 53 y.o. male who , hyperlipidemia comes today for followup, as per patient is compliant with his medications blood pressure is well controlled, today he does complain of occasional right shoulder pain as per patient it started while he was doing exercise, denies any fall or trauma denies any numbness weakness.  Past Medical History  Diagnosis Date  . GERD (gastroesophageal reflux disease)   . Umbilical hernia   . Constipation   . Cough   . Wheezing   . Weakness   . Generalized headaches   . Heart murmur   . H/O hiatal hernia     Past Surgical History  Procedure Laterality Date  . Femur surgery      left  . Toe surgery      dislocated rt foot  . Carpal tunnel release      R hand  . Umbilical hernia repair N/A 04/07/2012    Procedure: HERNIA REPAIR UMBILICAL ADULT;  Surgeon: Madilyn Hook, DO;  Location: [place];  Service: General;  Laterality: N/A;  open umbilical hernia repair with mesh  . Insertion of mesh N/A 04/07/2012    Procedure: INSERTION OF MESH;  Surgeon: Madilyn Hook, DO;  Location: [place];  Service: General;  Laterality: N/A;  . Hernia repair  04/07/12    umb hernia repair      Medication List       This list is accurate as of: 03/09/14 12:35 PM.  Always use your most recent med list.               acyclovir 400 MG tablet  Commonly known as:  ZOVIRAX  Take 400 mg by mouth daily.     aspirin 81 MG EC tablet  Take 1 tablet (81 mg total) by mouth daily.     ergocalciferol 50000 UNITS capsule  Commonly known as:  VITAMIN D2  Take 1 capsule (50,000 Units total) by mouth once a week.     hydrochlorothiazide 25 MG tablet  Commonly known as:  HYDRODIURIL  Take 1 tablet (25 mg total) by mouth daily.     ibuprofen 600 MG tablet    Commonly known as:  ADVIL,MOTRIN  Take 1 tablet (600 mg total) by mouth every 8 (eight) hours as needed.     tadalafil 20 MG tablet  Commonly known as:  CIALIS  Take 0.5 tablets (10 mg total) by mouth every other day as needed for erectile dysfunction.        Meds ordered this encounter  Medications  . ibuprofen (ADVIL,MOTRIN) 600 MG tablet    Sig: Take 1 tablet (600 mg total) by mouth every 8 (eight) hours as needed.    Dispense:  30 tablet    Refill:  1    Immunization History  Administered Date(s) Administered  . Influenza,inj,Quad PF,36+ Mos 12/22/2013  . Pneumococcal Polysaccharide-23 12/22/2013    Family History  Problem Relation Age of Onset  . Cancer Father     lung    History  Substance Use Topics  . Smoking status: Current Some Day Smoker -- 15 years    Types: Cigarettes  . Smokeless tobacco: Not on file     Comment: occas still smokes when he drinks  . Alcohol Use: 0.0 oz/week  0 Not specified per week     Comment: 3 40oz beers twice a week    Review of Systems   As noted in HPI  Filed Vitals:   03/09/14 1157  BP: 119/86  Pulse: 76  Temp: 98 F (36.7 C)  Resp: 16    Physical Exam  Physical Exam  Constitutional: No distress.  Eyes: EOM are normal. Pupils are equal, round, and reactive to light.  Cardiovascular: Normal rate and regular rhythm.   Pulmonary/Chest: Breath sounds normal. No respiratory distress. He has no wheezes. He has no rales.  Musculoskeletal: He exhibits no edema.    CBC    Component Value Date/Time   WBC 7.1 12/22/2013 0538   RBC 5.05 12/22/2013 0538   HGB 15.7 12/22/2013 0538   HCT 46.7 12/22/2013 0538   PLT 300 12/22/2013 0538   MCV 92.5 12/22/2013 0538   LYMPHSABS 2.3 12/21/2013 1110   MONOABS 0.5 12/21/2013 1110   EOSABS 0.2 12/21/2013 1110   BASOSABS 0.0 12/21/2013 1110    CMP     Component Value Date/Time   NA 141 12/22/2013 0538   K 4.0 12/22/2013 0538   CL 101 12/22/2013 0538   CO2 26  12/22/2013 0538   GLUCOSE 100* 12/22/2013 0538   BUN 11 12/22/2013 0538   CREATININE 1.08 12/22/2013 0538   CREATININE 1.14 08/20/2013 1227   CALCIUM 9.9 12/22/2013 0538   PROT 7.4 12/22/2013 0538   ALBUMIN 3.6 12/22/2013 0538   AST 12 12/22/2013 0538   ALT 17 12/22/2013 0538   ALKPHOS 64 12/22/2013 0538   BILITOT 0.4 12/22/2013 0538   GFRNONAA 78* 12/22/2013 0538   GFRNONAA 74 08/20/2013 1227   GFRAA >90 12/22/2013 0538   GFRAA 86 08/20/2013 1227    Lab Results  Component Value Date/Time   CHOL 197 12/22/2013 05:38 AM    No components found for: HGA1C  Lab Results  Component Value Date/Time   AST 12 12/22/2013 05:38 AM    Assessment and Plan  Essential hypertension, benign - Plan:blood pressure is well controlled currently patient is on hydrochlorothiazide, will check  COMPLETE METABOLIC PANEL WITH GFR  Dyslipidemia - Plan:will recheck  Lipid panel  Right shoulder pain - Plan: I have prescribed ibuprofen (ADVIL,MOTRIN) 600 MG tablet  Special screening for malignant neoplasms, colon - Plan: Ambulatory referral to Gastroenterology   Health Maintenance  -Vaccinations:  uptodate with flu shot and pneumovax   Return in about 3 months (around 06/08/2014) for hypertension.  Lorayne Marek, MD

## 2014-03-10 ENCOUNTER — Encounter (HOSPITAL_BASED_OUTPATIENT_CLINIC_OR_DEPARTMENT_OTHER): Payer: Self-pay

## 2014-03-11 ENCOUNTER — Telehealth: Payer: Self-pay

## 2014-03-11 NOTE — Telephone Encounter (Signed)
Patient is aware of his lab results 

## 2014-03-11 NOTE — Telephone Encounter (Signed)
-----   Message from Lorayne Marek, MD sent at 03/10/2014  9:14 AM EST ----- Blood work reviewed, call and let the patient know that his triglycerides level is improved  but  still elevated advised patient for low fat diet.

## 2014-03-23 ENCOUNTER — Other Ambulatory Visit: Payer: Self-pay | Admitting: Pulmonary Disease

## 2014-03-23 DIAGNOSIS — G4733 Obstructive sleep apnea (adult) (pediatric): Secondary | ICD-10-CM

## 2014-03-23 NOTE — Sleep Study (Signed)
   NAME: Samuel Irwin DATE OF BIRTH:  [birth year] MEDICAL RECORD NUMBER [id number]  LOCATION: [facility]  PHYSICIAN: [place] OF STUDY: 03/06/2014  SLEEP STUDY TYPE: Nocturnal Polysomnogram               REFERRING PHYSICIAN: Clance, Armando Reichert, MD  INDICATION FOR STUDY: Hypersomnia with sleep apnea  EPWORTH SLEEPINESS SCORE:   HEIGHT: 5\' 8"  (172.7 cm)  WEIGHT: 248 lb (112.492 kg)    Body mass index is 37.72 kg/(m^2).  NECK SIZE: 18 in.  MEDICATIONS: Reviewed in the sleep record  SLEEP ARCHITECTURE: The patient had a total sleep time of 310 minutes, with no slow-wave sleep but a normal quantity of REM. Sleep onset latency was normal at 2 minutes, and REM onset was normal at 12 minutes. Sleep efficiency was mildly reduced at 80%.  RESPIRATORY DATA: The patient underwent a bilevel titration study with a large Fischer Paykel Simplus full face mask. His bilevel pressure was increased in order to treat both obstructive events and snoring. The patient appeared to have an optimal pressure of 18/14, and attempts to increase higher only resulted in worsening central apneas.  OXYGEN DATA: The patient had oxygen desaturation as low as 76% prior to optimal bilevel pressure.  CARDIAC DATA: Occasional PVC noted  MOVEMENT/PARASOMNIA: No significant limb movements or other abnormal behaviors were seen.  IMPRESSION/ RECOMMENDATION:    1) good control of previously documented obstructive sleep apnea with a bilevel pressure of 18/14, and delivered by a large Fischer Paykel Simplus full face mask. The patient should also be encouraged to work aggressively on weight loss.  2) occasional PVC noted, but no clinically significant arrhythmias were seen.    [place], American Board of Sleep Medicine  ELECTRONICALLY SIGNED ON:  03/23/2014, 5:46 PM [place] PH: [phone]   FX: [phone] [place]

## 2014-03-23 NOTE — Progress Notes (Signed)
Let pt know that his sleep study showed a great response to bilevel.  Will order the new device along with the mask fitted at the sleep center.  I need to see him back in 6 weeks.

## 2014-03-24 NOTE — Progress Notes (Signed)
lmomtcb x1 for pt 

## 2014-03-25 NOTE — Progress Notes (Signed)
lmomtcb x 2  

## 2014-03-28 ENCOUNTER — Telehealth: Payer: Self-pay | Admitting: Pulmonary Disease

## 2014-03-28 NOTE — Telephone Encounter (Signed)
lmomtcb x1 

## 2014-03-29 NOTE — Telephone Encounter (Signed)
lmtcb x2 

## 2014-03-29 NOTE — Progress Notes (Signed)
lmomtcb x 3  

## 2014-03-30 NOTE — Telephone Encounter (Signed)
Called and spoke to pt. Pt stated his current bipap pressure is different from the pressure during his sleep study. Pt stated he preferred the pressure during the sleep study over the his current pressure. Pt stated he takes his mask off during the night, feels like he is "choking" during the night and is overall not sleeping well.    Vaughn please advise.

## 2014-03-30 NOTE — Telephone Encounter (Signed)
Spoke with patient-he is aware of KC's comments below. Pt states he will stick with his current pressure settings for now and see how it goes for the rest of the week. If he still wants the same pressure from sleep study then patient will call us back first of next week to place order. Pt feels that he is just adjusting to the machine and mask at this time.

## 2014-03-30 NOTE — Telephone Encounter (Signed)
Let pt know that his bilevel device is currently set on LESS pressure than his sleep study.  I did this in order to let him slowly adapt to the higher pressures.  If he wants to be set on same pressure as sleep center ,please send order to DME to set pressure on 18/14

## 2014-03-30 NOTE — Progress Notes (Signed)
See phone note 03/29/14

## 2014-04-05 NOTE — Telephone Encounter (Signed)
Please see phone note from 03/28/2014. Will sign off.

## 2014-04-30 ENCOUNTER — Inpatient Hospital Stay (HOSPITAL_COMMUNITY): Payer: Self-pay

## 2014-04-30 ENCOUNTER — Inpatient Hospital Stay (HOSPITAL_COMMUNITY)
Admission: EM | Admit: 2014-04-30 | Discharge: 2014-05-03 | DRG: 871 | Disposition: A | Discharge status: Home / Self Care | Payer: Self-pay | Attending: Internal Medicine | Admitting: Internal Medicine

## 2014-04-30 ENCOUNTER — Emergency Department (HOSPITAL_COMMUNITY): Payer: Self-pay

## 2014-04-30 ENCOUNTER — Encounter (HOSPITAL_COMMUNITY): Payer: Self-pay | Admitting: Nurse Practitioner

## 2014-04-30 DIAGNOSIS — Z7982 Long term (current) use of aspirin: Secondary | ICD-10-CM

## 2014-04-30 DIAGNOSIS — A419 Sepsis, unspecified organism: Principal | ICD-10-CM | POA: Diagnosis present

## 2014-04-30 DIAGNOSIS — F17210 Nicotine dependence, cigarettes, uncomplicated: Secondary | ICD-10-CM | POA: Diagnosis present

## 2014-04-30 DIAGNOSIS — J42 Unspecified chronic bronchitis: Secondary | ICD-10-CM | POA: Diagnosis present

## 2014-04-30 DIAGNOSIS — I10 Essential (primary) hypertension: Secondary | ICD-10-CM | POA: Diagnosis present

## 2014-04-30 DIAGNOSIS — R7989 Other specified abnormal findings of blood chemistry: Secondary | ICD-10-CM | POA: Diagnosis present

## 2014-04-30 DIAGNOSIS — N179 Acute kidney failure, unspecified: Secondary | ICD-10-CM | POA: Diagnosis present

## 2014-04-30 DIAGNOSIS — K219 Gastro-esophageal reflux disease without esophagitis: Secondary | ICD-10-CM | POA: Diagnosis present

## 2014-04-30 DIAGNOSIS — G4733 Obstructive sleep apnea (adult) (pediatric): Secondary | ICD-10-CM | POA: Diagnosis present

## 2014-04-30 DIAGNOSIS — Z716 Tobacco abuse counseling: Secondary | ICD-10-CM | POA: Diagnosis present

## 2014-04-30 DIAGNOSIS — R197 Diarrhea, unspecified: Secondary | ICD-10-CM | POA: Diagnosis present

## 2014-04-30 DIAGNOSIS — M25511 Pain in right shoulder: Secondary | ICD-10-CM | POA: Diagnosis present

## 2014-04-30 DIAGNOSIS — E860 Dehydration: Secondary | ICD-10-CM | POA: Diagnosis present

## 2014-04-30 DIAGNOSIS — M25519 Pain in unspecified shoulder: Secondary | ICD-10-CM

## 2014-04-30 DIAGNOSIS — J189 Pneumonia, unspecified organism: Secondary | ICD-10-CM | POA: Diagnosis present

## 2014-04-30 DIAGNOSIS — R52 Pain, unspecified: Secondary | ICD-10-CM

## 2014-04-30 HISTORY — DX: Herpesviral infection, unspecified: B00.9

## 2014-04-30 LAB — CBC WITH DIFFERENTIAL/PLATELET
Basophils Absolute: 0 10*3/uL (ref 0.0–0.1)
Basophils Relative: 0 % (ref 0–1)
EOS PCT: 0 % (ref 0–5)
Eosinophils Absolute: 0 10*3/uL (ref 0.0–0.7)
HEMATOCRIT: 48.2 % (ref 39.0–52.0)
HEMOGLOBIN: 16.8 g/dL (ref 13.0–17.0)
Lymphocytes Relative: 7 % — ABNORMAL LOW (ref 12–46)
Lymphs Abs: 1.9 10*3/uL (ref 0.7–4.0)
MCH: 31.2 pg (ref 26.0–34.0)
MCHC: 34.9 g/dL (ref 30.0–36.0)
MCV: 89.6 fL (ref 78.0–100.0)
MONOS PCT: 7 % (ref 3–12)
Monocytes Absolute: 1.9 10*3/uL — ABNORMAL HIGH (ref 0.1–1.0)
NEUTROS ABS: 24 10*3/uL — AB (ref 1.7–7.7)
Neutrophils Relative %: 86 % — ABNORMAL HIGH (ref 43–77)
PLATELETS: 284 10*3/uL (ref 150–400)
RBC: 5.38 MIL/uL (ref 4.22–5.81)
RDW: 15.1 % (ref 11.5–15.5)
WBC: 27.8 10*3/uL — ABNORMAL HIGH (ref 4.0–10.5)

## 2014-04-30 LAB — COMPREHENSIVE METABOLIC PANEL
ALBUMIN: 4.3 g/dL (ref 3.5–5.2)
ALT: 17 U/L (ref 0–53)
AST: 18 U/L (ref 0–37)
Alkaline Phosphatase: 81 U/L (ref 39–117)
Anion gap: 13 (ref 5–15)
BILIRUBIN TOTAL: 1.6 mg/dL — AB (ref 0.3–1.2)
BUN: 13 mg/dL (ref 6–23)
CO2: 26 mmol/L (ref 19–32)
Calcium: 9.3 mg/dL (ref 8.4–10.5)
Chloride: 96 mmol/L (ref 96–112)
Creatinine, Ser: 1.7 mg/dL — ABNORMAL HIGH (ref 0.50–1.35)
GFR calc Af Amer: 52 mL/min — ABNORMAL LOW (ref >90)
GFR, EST NON AFRICAN AMERICAN: 45 mL/min — AB (ref >90)
GLUCOSE: 121 mg/dL — AB (ref 70–99)
POTASSIUM: 3.7 mmol/L (ref 3.5–5.1)
Sodium: 135 mmol/L (ref 135–145)
TOTAL PROTEIN: 8.3 g/dL (ref 6.0–8.3)

## 2014-04-30 LAB — URINALYSIS, ROUTINE W REFLEX MICROSCOPIC
Bilirubin Urine: NEGATIVE
GLUCOSE, UA: NEGATIVE mg/dL
Ketones, ur: NEGATIVE mg/dL
LEUKOCYTES UA: NEGATIVE
Nitrite: NEGATIVE
Protein, ur: NEGATIVE mg/dL
Specific Gravity, Urine: 1.02 (ref 1.005–1.030)
UROBILINOGEN UA: 0.2 mg/dL (ref 0.0–1.0)
pH: 6.5 (ref 5.0–8.0)

## 2014-04-30 LAB — I-STAT CG4 LACTIC ACID, ED: LACTIC ACID, VENOUS: 2.54 mmol/L — AB (ref 0.5–2.0)

## 2014-04-30 LAB — URINE MICROSCOPIC-ADD ON

## 2014-04-30 MED ORDER — SODIUM CHLORIDE 0.9 % IV BOLUS (SEPSIS)
1000.0000 mL | Freq: Once | INTRAVENOUS | Status: AC
  Administered 2014-04-30: 1000 mL via INTRAVENOUS

## 2014-04-30 MED ORDER — ACETAMINOPHEN 325 MG PO TABS
ORAL_TABLET | ORAL | Status: AC
  Filled 2014-04-30: qty 2

## 2014-04-30 MED ORDER — PIPERACILLIN-TAZOBACTAM 3.375 G IVPB 30 MIN
3.3750 g | Freq: Once | INTRAVENOUS | Status: DC
  Filled 2014-04-30: qty 50

## 2014-04-30 MED ORDER — DEXTROSE 5 % IV SOLN
500.0000 mg | INTRAVENOUS | Status: DC
  Administered 2014-04-30 – 2014-05-01 (×2): 500 mg via INTRAVENOUS
  Filled 2014-04-30 (×4): qty 500

## 2014-04-30 MED ORDER — DEXTROSE 5 % IV SOLN
1.0000 g | INTRAVENOUS | Status: DC
  Administered 2014-04-30 – 2014-05-02 (×3): 1 g via INTRAVENOUS
  Filled 2014-04-30 (×4): qty 10

## 2014-04-30 MED ORDER — ACETAMINOPHEN 325 MG PO TABS
650.0000 mg | ORAL_TABLET | Freq: Four times a day (QID) | ORAL | Status: DC | PRN
  Administered 2014-04-30 – 2014-05-02 (×7): 650 mg via ORAL
  Filled 2014-04-30 (×6): qty 2

## 2014-04-30 MED ORDER — VANCOMYCIN HCL 10 G IV SOLR
1250.0000 mg | Freq: Once | INTRAVENOUS | Status: AC
  Administered 2014-04-30: 1250 mg via INTRAVENOUS
  Filled 2014-04-30: qty 1250

## 2014-04-30 MED ORDER — SODIUM CHLORIDE 0.9 % IV SOLN
INTRAVENOUS | Status: DC
  Administered 2014-04-30 – 2014-05-03 (×5): via INTRAVENOUS

## 2014-04-30 MED ORDER — ENOXAPARIN SODIUM 40 MG/0.4ML ~~LOC~~ SOLN
40.0000 mg | SUBCUTANEOUS | Status: DC
  Administered 2014-04-30 – 2014-05-02 (×3): 40 mg via SUBCUTANEOUS
  Filled 2014-04-30 (×4): qty 0.4

## 2014-04-30 MED ORDER — GUAIFENESIN 100 MG/5ML PO SYRP
200.0000 mg | ORAL_SOLUTION | ORAL | Status: DC | PRN
  Filled 2014-04-30: qty 10

## 2014-04-30 MED ORDER — KETOROLAC TROMETHAMINE 30 MG/ML IJ SOLN
30.0000 mg | Freq: Once | INTRAMUSCULAR | Status: AC
  Administered 2014-04-30: 30 mg via INTRAVENOUS
  Filled 2014-04-30: qty 1

## 2014-04-30 NOTE — ED Notes (Signed)
He c/o body aches, headache and dry cough since early this morning.  He is concerned because hes a smoker and has had pneumonia in the past. He was around his neighbor that is sick.  He denies fevers.

## 2014-04-30 NOTE — ED Notes (Signed)
Attempted report 

## 2014-04-30 NOTE — Progress Notes (Signed)
Pt. Arrived to room from ER at 1755 p.m.  Pt. Is alert & oriented x 4.  No c/o pain.  Oriented to room and call bell.

## 2014-04-30 NOTE — ED Provider Notes (Signed)
CSN: [id number]     Arrival date & time 04/30/14  1224 History   First MD Initiated Contact with Patient 04/30/14 1255     Chief Complaint  Patient presents with  . Generalized Body Aches     (Consider location/radiation/quality/duration/timing/severity/associated sxs/prior Treatment) HPI Comments: Patient is a 54 year old male with a past medical history of GERD who presents with a 1 day history of myalgias. Symptoms started gradually and progressively worsened since the onset. The pain is aching and moderate without radiation. Patient reports associated dry cough since this morning. Patient reports sharing a beer and hanging out with his neighbor who has pneumonia. He thinks he may have contracted it from him. No aggravating/alleviating factors. No other associated symptoms.    Past Medical History  Diagnosis Date  . GERD (gastroesophageal reflux disease)   . Umbilical hernia   . Constipation   . Cough   . Wheezing   . Weakness   . Generalized headaches   . Heart murmur   . H/O hiatal hernia   . Herpes    Past Surgical History  Procedure Laterality Date  . Femur surgery      left  . Toe surgery      dislocated rt foot  . Carpal tunnel release      R hand  . Umbilical hernia repair N/A 04/07/2012    Procedure: HERNIA REPAIR UMBILICAL ADULT;  Surgeon: Madilyn Hook, DO;  Location: [place];  Service: General;  Laterality: N/A;  open umbilical hernia repair with mesh  . Insertion of mesh N/A 04/07/2012    Procedure: INSERTION OF MESH;  Surgeon: Madilyn Hook, DO;  Location: [place];  Service: General;  Laterality: N/A;  . Hernia repair  04/07/12    umb hernia repair   Family History  Problem Relation Age of Onset  . Cancer Father     lung   History  Substance Use Topics  . Smoking status: Current Some Day Smoker -- 15 years    Types: Cigarettes  . Smokeless tobacco: Not on file     Comment: occas still smokes when he drinks  . Alcohol Use: 0.0 oz/week    0 Standard drinks or  equivalent per week     Comment: 3 40oz beers twice a week    Review of Systems  Constitutional: Positive for fever. Negative for chills and fatigue.  HENT: Negative for trouble swallowing.   Eyes: Negative for visual disturbance.  Respiratory: Negative for shortness of breath.   Cardiovascular: Negative for chest pain and palpitations.  Gastrointestinal: Negative for nausea, vomiting, abdominal pain and diarrhea.  Genitourinary: Negative for dysuria and difficulty urinating.  Musculoskeletal: Positive for myalgias. Negative for arthralgias and neck pain.  Skin: Negative for color change.  Neurological: Negative for dizziness and weakness.  Psychiatric/Behavioral: Negative for dysphoric mood.      Allergies  Review of patient's allergies indicates no known allergies.  Home Medications   Prior to Admission medications   Medication Sig Start Date End Date Taking? Authorizing Provider  acyclovir (ZOVIRAX) 400 MG tablet Take 400 mg by mouth daily.     Historical Provider, MD  aspirin EC 81 MG EC tablet Take 1 tablet (81 mg total) by mouth daily. 12/22/13   Charlynne Cousins, MD  ergocalciferol (VITAMIN D2) 50000 UNITS capsule Take 1 capsule (50,000 Units total) by mouth once a week. 05/19/13   Reyne Dumas, MD  hydrochlorothiazide (HYDRODIURIL) 25 MG tablet Take 1 tablet (25 mg total) by  mouth daily. 05/18/13   Reyne Dumas, MD  ibuprofen (ADVIL,MOTRIN) 600 MG tablet Take 1 tablet (600 mg total) by mouth every 8 (eight) hours as needed. 03/09/14   Lorayne Marek, MD  tadalafil (CIALIS) 20 MG tablet Take 0.5 tablets (10 mg total) by mouth every other day as needed for erectile dysfunction. 08/31/13   Lorayne Marek, MD   BP 115/73 mmHg  Pulse 118  Temp(Src) 101 F (38.3 C) (Oral)  Resp 24  Ht 5\' 8"  (1.727 m)  Wt 140 lb (63.504 kg)  BMI 21.29 kg/m2  SpO2 95% Physical Exam  Constitutional: He is oriented to person, place, and time. He appears well-developed and well-nourished. No  distress.  HENT:  Head: Normocephalic and atraumatic.  Eyes: Conjunctivae and EOM are normal.  Neck: Normal range of motion.  Cardiovascular: Normal rate and regular rhythm.  Exam reveals no gallop and no friction rub.   No murmur heard. Pulmonary/Chest: Effort normal and breath sounds normal. He has no wheezes. He has no rales. He exhibits no tenderness.  Abdominal: Soft. He exhibits no distension. There is no tenderness. There is no rebound.  Musculoskeletal: Normal range of motion.  Neurological: He is alert and oriented to person, place, and time. Coordination normal.  Speech is goal-oriented. Moves limbs without ataxia.   Skin: Skin is warm and dry.  Psychiatric: He has a normal mood and affect. His behavior is normal.  Nursing note and vitals reviewed.   ED Course  Procedures (including critical care time) Labs Review Labs Reviewed  CBC WITH DIFFERENTIAL/PLATELET - Abnormal; Notable for the following:    WBC 27.8 (*)    Neutrophils Relative % 86 (*)    Lymphocytes Relative 7 (*)    Neutro Abs 24.0 (*)    Monocytes Absolute 1.9 (*)    All other components within normal limits  COMPREHENSIVE METABOLIC PANEL - Abnormal; Notable for the following:    Glucose, Bld 121 (*)    Creatinine, Ser 1.70 (*)    Total Bilirubin 1.6 (*)    GFR calc non Af Amer 45 (*)    GFR calc Af Amer 52 (*)    All other components within normal limits  I-STAT CG4 LACTIC ACID, ED - Abnormal; Notable for the following:    Lactic Acid, Venous 2.54 (*)    All other components within normal limits  CULTURE, BLOOD (ROUTINE X 2)  CULTURE, BLOOD (ROUTINE X 2)  URINE CULTURE  URINALYSIS, ROUTINE W REFLEX MICROSCOPIC   CRITICAL CARE Performed by: Alvina Chou   Total critical care time: 30 minutes  Critical care time was exclusive of separately billable procedures and treating other patients.  Critical care was necessary to treat or prevent imminent or life-threatening  deterioration.  Critical care was time spent personally by me on the following activities: development of treatment plan with patient and/or surrogate as well as nursing, discussions with consultants, evaluation of patient's response to treatment, examination of patient, obtaining history from patient or surrogate, ordering and performing treatments and interventions, ordering and review of laboratory studies, ordering and review of radiographic studies, pulse oximetry and re-evaluation of patient's condition.   Imaging Review Dg Chest 2 View  04/30/2014   CLINICAL DATA:  Body aches. Headache. Dry cough. Initial encounter. Onset of symptoms this morning.  EXAM: CHEST  2 VIEW  COMPARISON:  Chest radiograph 12/22/2014.  FINDINGS: LEFT upper lobe airspace opacity is present consistent with pneumonia. Asymmetric/ atypical pulmonary edema is less likely. This is also seen  on the lateral view projecting over the upper thoracic spine. The cardiopericardial silhouette is borderline for projection. No pleural effusions. Tortuous thoracic aorta.  IMPRESSION: LEFT upper lobe pneumonia. Followup in 4-6 weeks to ensure radiographic clearing and exclude an underlying lesion is recommended.   Electronically Signed   By: Dereck Ligas M.D.   On: 04/30/2014 15:52   Dg Shoulder Right Port  04/30/2014   CLINICAL DATA:  Right shoulder pain for 2 months. Initial encounter.  EXAM: PORTABLE RIGHT SHOULDER - 2+ VIEW  COMPARISON:  07/20/2003  FINDINGS: There is no evidence of fracture or dislocation. There is no evidence of arthropathy or other focal bone abnormality. Soft tissues are unremarkable.  IMPRESSION: Negative.   Electronically Signed   By: Margarette Canada M.D.   On: 04/30/2014 23:10     EKG Interpretation None      MDM   Final diagnoses:  Sepsis, due to unspecified organism  CAP (community acquired pneumonia)    1:27 PM Labs and chest xray pending. Patient is febrile and tachycardic at 130 at this time.  Patient will have fluids and toradol. Patient has an elevated lactic acid at 2.54.   3:59 PM Chest xray shows pneumonia. Patient will be admitted.     Alvina Chou, [place] 05/01/14 [phone]

## 2014-04-30 NOTE — ED Notes (Signed)
Called x-ray.  They stated transport is on their way to pick up pt.

## 2014-04-30 NOTE — ED Notes (Signed)
I Stat Lactic Acid results shown to Dr. Zenia Resides

## 2014-04-30 NOTE — Progress Notes (Signed)
MD notified that patient is having positional right shoulder pain.  Pt denies chest pain and radiation. VS taken and charted.  MD ordered shoulder xray.  Will continue to monitor patient.

## 2014-04-30 NOTE — H&P (Addendum)
[facility] History and Physical  Samuel Irwin KZS:[id number] DOB: 07-Dec-1960 DOA: 04/30/2014  Referring physician:EDP PCP: Lorayne Marek, MD   Chief Complaint: sob and feeling malaise for one week.   HPI: Samuel Irwin is a 54 y.o. male with h/o chronic bronchitis, gerd comes inf or feeling sob, cough, chest pain on coughing and generalized body aches, and subjective chills and fevers. On arrival to ED, he was found to have left upper lobe pneumonia. His labs revealed leukocytosis, acute renal failure and elevated lactic acid. He was found to be febrile, and tachycardic. He was referred to medical service for admission for sepsis from left upper lobe pneumonia.    Review of Systems:  Constitutional:  Positive for , Fevers, chills, fatigue.  HEENT:  No headaches, Difficulty swallowing,Tooth/dental problems,Sore throat,  No sneezing, itching, ear ache, nasal congestion, post nasal drip,  Cardio-vascular:  No , Orthopnea, PND, swelling in lower extremities, anasarca, dizziness, palpitations  GI:  No heartburn, indigestion, abdominal pain, nausea, vomiting, diarrhea, change in bowel habits, loss of appetite  Resp:  Positive for sob, chest pain, coughing.  Skin:  no rash or lesions.  GU:  no dysuria, change in color of urine, no urgency or frequency. No flank pain.  Musculoskeletal:  Generalized body aches.  Psych:  No change in mood or affect. No depression or anxiety. No memory loss.   Past Medical History  Diagnosis Date  . GERD (gastroesophageal reflux disease)   . Umbilical hernia   . Constipation   . Cough   . Wheezing   . Weakness   . Generalized headaches   . Heart murmur   . H/O hiatal hernia   . Herpes    Past Surgical History  Procedure Laterality Date  . Femur surgery      left  . Toe surgery      dislocated rt foot  . Carpal tunnel release      R hand  . Umbilical hernia repair N/A 04/07/2012    Procedure: HERNIA REPAIR UMBILICAL ADULT;   Surgeon: Madilyn Hook, DO;  Location: [place];  Service: General;  Laterality: N/A;  open umbilical hernia repair with mesh  . Insertion of mesh N/A 04/07/2012    Procedure: INSERTION OF MESH;  Surgeon: Madilyn Hook, DO;  Location: [place];  Service: General;  Laterality: N/A;  . Hernia repair  04/07/12    umb hernia repair   Social History:  reports that he has been smoking Cigarettes.  He has smoked for the past 15 years. He does not have any smokeless tobacco history on file. He reports that he drinks alcohol. He reports that he does not use illicit drugs.  No Known Allergies  Family History  Problem Relation Age of Onset  . Cancer Father     lung  positive for hypertension in mother and  Diabetes in aunt.    Prior to Admission medications   Medication Sig Start Date End Date Taking? Authorizing Provider  acyclovir (ZOVIRAX) 400 MG tablet Take 400 mg by mouth 2 (two) times daily.    Yes Historical Provider, MD  hydrochlorothiazide (HYDRODIURIL) 25 MG tablet Take 1 tablet (25 mg total) by mouth daily. 05/18/13  Yes Reyne Dumas, MD  aspirin EC 81 MG EC tablet Take 1 tablet (81 mg total) by mouth daily. 12/22/13   Charlynne Cousins, MD  ergocalciferol (VITAMIN D2) 50000 UNITS capsule Take 1 capsule (50,000 Units total) by mouth once a week. 05/19/13   Reyne Dumas,  MD  ibuprofen (ADVIL,MOTRIN) 600 MG tablet Take 1 tablet (600 mg total) by mouth every 8 (eight) hours as needed. Patient not taking: Reported on 04/30/2014 03/09/14   Lorayne Marek, MD  tadalafil (CIALIS) 20 MG tablet Take 0.5 tablets (10 mg total) by mouth every other day as needed for erectile dysfunction. Patient not taking: Reported on 04/30/2014 08/31/13   Lorayne Marek, MD   Physical Exam: Filed Vitals:   04/30/14 1400 04/30/14 1415 04/30/14 1428 04/30/14 1759  BP: 122/65 108/64  119/73  Pulse: 112 108  95  Temp:   99.6 F (37.6 C) 98.8 F (37.1 C)  TempSrc:   Oral Oral  Resp: 25 19  16   Height:      Weight:      SpO2:  96% 95%  95%    Wt Readings from Last 3 Encounters:  04/30/14 63.504 kg (140 lb)  03/09/14 114.306 kg (252 lb)  03/06/14 112.492 kg (248 lb)    General:  Appears calm and comfortable Eyes: PERRL, normal lids, irises & conjunctiva Neck: no LAD, masses or thyromegaly Cardiovascular: RRR, no m/r/g. No LE edema. Telemetry: SR, no arrhythmias  Respiratory: clear to ausculation, scattered rhonchi, no wheezing. Air entry fair.  Abdomen: soft, ntnd Skin: no rash or induration seen on limited exam Musculoskeletal: grossly normal tone BUE/BLE Psychiatric: grossly normal mood and affect, speech fluent and appropriate Neurologic: grossly non-focal.          Labs on Admission:  Basic Metabolic Panel:  Recent Labs Lab 04/30/14 1246  NA 135  K 3.7  CL 96  CO2 26  GLUCOSE 121*  BUN 13  CREATININE 1.70*  CALCIUM 9.3   Liver Function Tests:  Recent Labs Lab 04/30/14 1246  AST 18  ALT 17  ALKPHOS 81  BILITOT 1.6*  PROT 8.3  ALBUMIN 4.3   No results for input(s): LIPASE, AMYLASE in the last 168 hours. No results for input(s): AMMONIA in the last 168 hours. CBC:  Recent Labs Lab 04/30/14 1246  WBC 27.8*  NEUTROABS 24.0*  HGB 16.8  HCT 48.2  MCV 89.6  PLT 284   Cardiac Enzymes: No results for input(s): CKTOTAL, CKMB, CKMBINDEX, TROPONINI in the last 168 hours.  BNP (last 3 results) No results for input(s): BNP in the last 8760 hours.  ProBNP (last 3 results) No results for input(s): PROBNP in the last 8760 hours.  CBG: No results for input(s): GLUCAP in the last 168 hours.  Radiological Exams on Admission: Dg Chest 2 View  04/30/2014   CLINICAL DATA:  Body aches. Headache. Dry cough. Initial encounter. Onset of symptoms this morning.  EXAM: CHEST  2 VIEW  COMPARISON:  Chest radiograph 12/22/2014.  FINDINGS: LEFT upper lobe airspace opacity is present consistent with pneumonia. Asymmetric/ atypical pulmonary edema is less likely. This is also seen on the lateral  view projecting over the upper thoracic spine. The cardiopericardial silhouette is borderline for projection. No pleural effusions. Tortuous thoracic aorta.  IMPRESSION: LEFT upper lobe pneumonia. Followup in 4-6 weeks to ensure radiographic clearing and exclude an underlying lesion is recommended.   Electronically Signed   By: Dereck Ligas M.D.   On: 04/30/2014 15:52    EKG:  Not done, ordered  Assessment/Plan Active Problems:   CAP (community acquired pneumonia)   Sepsis secondary to left upper lobe pneumonia: - on admission, patient, was febrile, tachycardic, leukocytosis of 27,000 and  Reviewed CXR revealing left upper lobe pneumonia.  - he was initially started on  broad spectrum antibiotics, IV vancomycin and IV zosyn, later on changed to IV rocephin and IV zithromax. He reports he is still smoking, but only occasionally. He refused nicotine patch. He reports chest pai on coughing. Will add robitussin. Get influenza pCR.   Smoking cessation counseling given.   Acute renal failure: Possibly from dehydration and sepsis. Started him on fluids and repeat renal parameters in am.  Check UA, urine electrolytes, US RENAL . Avoid nephrotoxins.   Elevated lactic acid: possibly from sepsis from CAP.    Tachycardia: 12 lead EKG ORDERED and pending.   Hypertension:  His bp is borderline, . Fluid bolus ordered.  On IV FLUIDS.   Obstructive  Sleep apnea:  CPAP  Ordered for tonight.     Code Status: full code.  DVT Prophylaxis: lovenox. Family Communication: none at bedside.  Disposition Plan: admit to telemetry.   Time spent: 70 minutes.  [facility] Pager [phone]

## 2014-04-30 NOTE — ED Provider Notes (Signed)
Medical screening examination/treatment/procedure(s) were conducted as a shared visit with non-physician practitioner(s) and myself.  I personally evaluated the patient during the encounter.   EKG Interpretation None     Patient here with fever and shortness of breath with minimal cough. Febrile here on arrival. Labs show leukocytosis as well as mild elevated lactate. Suspect pneumonia will start on antibiotics. Patient will require admission. Patient also with renal insufficiency with cranial to 1.7 which is elevated from his baseline.  Leota Jacobsen, MD 04/30/14 [phone]

## 2014-05-01 ENCOUNTER — Inpatient Hospital Stay (HOSPITAL_COMMUNITY): Payer: Self-pay

## 2014-05-01 LAB — CBC
HEMATOCRIT: 42.5 % (ref 39.0–52.0)
Hemoglobin: 14.3 g/dL (ref 13.0–17.0)
MCH: 30.6 pg (ref 26.0–34.0)
MCHC: 33.6 g/dL (ref 30.0–36.0)
MCV: 90.8 fL (ref 78.0–100.0)
PLATELETS: 254 10*3/uL (ref 150–400)
RBC: 4.68 MIL/uL (ref 4.22–5.81)
RDW: 15.5 % (ref 11.5–15.5)
WBC: 27.5 10*3/uL — ABNORMAL HIGH (ref 4.0–10.5)

## 2014-05-01 LAB — HIV ANTIBODY (ROUTINE TESTING W REFLEX): HIV SCREEN 4TH GENERATION: NONREACTIVE

## 2014-05-01 LAB — BASIC METABOLIC PANEL
Anion gap: 10 (ref 5–15)
BUN: 18 mg/dL (ref 6–23)
CO2: 24 mmol/L (ref 19–32)
CREATININE: 1.56 mg/dL — AB (ref 0.50–1.35)
Calcium: 8.4 mg/dL (ref 8.4–10.5)
Chloride: 101 mmol/L (ref 96–112)
GFR, EST AFRICAN AMERICAN: 57 mL/min — AB (ref >90)
GFR, EST NON AFRICAN AMERICAN: 49 mL/min — AB (ref >90)
Glucose, Bld: 119 mg/dL — ABNORMAL HIGH (ref 70–99)
POTASSIUM: 3.5 mmol/L (ref 3.5–5.1)
Sodium: 135 mmol/L (ref 135–145)

## 2014-05-01 LAB — INFLUENZA PANEL BY PCR (TYPE A & B)
H1N1 flu by pcr: NOT DETECTED
Influenza A By PCR: NEGATIVE
Influenza B By PCR: NEGATIVE

## 2014-05-01 LAB — STREP PNEUMONIAE URINARY ANTIGEN: Strep Pneumo Urinary Antigen: POSITIVE — AB

## 2014-05-01 LAB — CLOSTRIDIUM DIFFICILE BY PCR: Toxigenic C. Difficile by PCR: NEGATIVE

## 2014-05-01 LAB — SODIUM, URINE, RANDOM

## 2014-05-01 LAB — CREATININE, URINE, RANDOM: Creatinine, Urine: 397.06 mg/dL

## 2014-05-01 LAB — PROCALCITONIN: PROCALCITONIN: 2.01 ng/mL

## 2014-05-01 MED ORDER — ACYCLOVIR 400 MG PO TABS
400.0000 mg | ORAL_TABLET | Freq: Two times a day (BID) | ORAL | Status: DC
  Administered 2014-05-01 – 2014-05-03 (×5): 400 mg via ORAL
  Filled 2014-05-01 (×6): qty 1

## 2014-05-01 MED ORDER — TRAMADOL HCL 50 MG PO TABS
50.0000 mg | ORAL_TABLET | Freq: Once | ORAL | Status: AC
  Administered 2014-05-01: 50 mg via ORAL
  Filled 2014-05-01: qty 1

## 2014-05-01 NOTE — Progress Notes (Signed)
RT Note:  Patient refused CPAP at this time.  Patient encouraged to call for Respiratory if CPAP desired during hospital stay.  

## 2014-05-01 NOTE — Progress Notes (Signed)
MD notified that patient is complaining of right shoulder pain again, however, this incident also includes radiation down the right arm.  VS obtained and EKG ordered and showed NSR. Will continue to monitor patient.

## 2014-05-01 NOTE — Progress Notes (Signed)
[facility] PROGRESS NOTE  Samuel Irwin [id number]:[id number] DOB: [birth year] DOA: 04/30/2014 PCP: Lorayne Marek, MD INTERIM SUMMARY: 54 y.o male admitted for sob, ws found to have left upper lobe pneumonia.  Assessment/Plan: 1. Sepsis from left upper lobe pneumonia: Improving, he is afebrile, his tachycardia has improved and he is no longer hypotensive. He reports breathing has improved. He was started on IV antibiotics. His oxygen sats are good on RA. Influenza PCR is negative.  Urine for streptococcal antigen is postive. Blood cultures and sputum cultures are negative so far.    2. Smoking cessation counseling given. He reports he only smokes occasionally.    Elevated lactic acid level: repeeat levels in am.    Tachycardia: Sinus. Resolved with resolution of the fever.    Hypertension: His bp was borderline on admission, stopped HCTZ and ordered.  His bp much improved.   OSA;  on CPAP at home, he is refusing to wear here.    Shoulder pain : resolved.  Reviewed the X RAY of the  Right shoulder, it is negative for fractures or dislocation or arthritis.  Acute renal failure: Probably from dehydration and sepsis, improving with fluids. UA is negative for infection. US renal ordered and is pending. Recommend checking repeat renal parameters in am.   Leukocytosis: Persistent and procalcitonin levels ordered.  No other source of infection found.   Diarrhea: - slightly improved.  c diff pcr is negative.  - will order GI pathogen pcr if diarrhea does n't improve.      Code Status: full code.  Family Communication:none at bedside Disposition Plan: home when pneumonia and diarrhea, and renal failure improves.    Consultants:  none  Procedures:  US renal  Antibiotics:  Rocephin and zithromax.   HPI/Subjective: Reports breathing better.   Objective: Filed Vitals:   05/01/14 1352  BP: 116/72  Pulse: 87  Temp: 98.5 F (36.9 C)  Resp: 18     Intake/Output Summary (Last 24 hours) at 05/01/14 1601 Last data filed at 05/01/14 1400  Gross per 24 hour  Intake 2452.5 ml  Output    650 ml  Net 1802.5 ml   Filed Weights   04/30/14 1234  Weight: 63.504 kg (140 lb)    Exam:   General:  Alert afebrile not in any distress  Cardiovascular: s1s2, no MRG RRR  Respiratory: clear to auscultation, no wheezing or rhonchi  Abdomen: soft non tender non distended bowel sounds heard.   Musculoskeletal: no pedal edema cyanosis or clubbing.   Data Reviewed: Basic Metabolic Panel:  Recent Labs Lab 04/30/14 1246 05/01/14 0549  NA 135 135  K 3.7 3.5  CL 96 101  CO2 26 24  GLUCOSE 121* 119*  BUN 13 18  CREATININE 1.70* 1.56*  CALCIUM 9.3 8.4   Liver Function Tests:  Recent Labs Lab 04/30/14 1246  AST 18  ALT 17  ALKPHOS 81  BILITOT 1.6*  PROT 8.3  ALBUMIN 4.3   No results for input(s): LIPASE, AMYLASE in the last 168 hours. No results for input(s): AMMONIA in the last 168 hours. CBC:  Recent Labs Lab 04/30/14 1246 05/01/14 0549  WBC 27.8* 27.5*  NEUTROABS 24.0*  --   HGB 16.8 14.3  HCT 48.2 42.5  MCV 89.6 90.8  PLT 284 254   Cardiac Enzymes: No results for input(s): CKTOTAL, CKMB, CKMBINDEX, TROPONINI in the last 168 hours. BNP (last 3 results) No results for input(s): BNP in the last 8760 hours.  ProBNP (last 3 results)  No results for input(s): PROBNP in the last 8760 hours.  CBG: No results for input(s): GLUCAP in the last 168 hours.  Recent Results (from the past 240 hour(s))  Blood culture (routine x 2)     Status: None (Preliminary result)   Collection Time: 04/30/14 12:46 PM  Result Value Ref Range Status   Specimen Description BLOOD RIGHT ARM  Final   Special Requests BOTTLES DRAWN AEROBIC ONLY 3CC  Final   Culture   Final           BLOOD CULTURE RECEIVED NO GROWTH TO DATE CULTURE WILL BE HELD FOR 5 DAYS BEFORE ISSUING A FINAL NEGATIVE REPORT Performed at [facility]     Report Status PENDING  Incomplete  Blood culture (routine x 2)     Status: None (Preliminary result)   Collection Time: 04/30/14  1:30 PM  Result Value Ref Range Status   Specimen Description BLOOD RIGHT HAND  Final   Special Requests BOTTLES DRAWN AEROBIC ONLY 10CC  Final   Culture   Final           BLOOD CULTURE RECEIVED NO GROWTH TO DATE CULTURE WILL BE HELD FOR 5 DAYS BEFORE ISSUING A FINAL NEGATIVE REPORT Performed at [facility]    Report Status PENDING  Incomplete  Clostridium Difficile by PCR     Status: None   Collection Time: 04/30/14 11:59 PM  Result Value Ref Range Status   C difficile by pcr NEGATIVE NEGATIVE Final     Studies: Dg Chest 2 View  04/30/2014   CLINICAL DATA:  Body aches. Headache. Dry cough. Initial encounter. Onset of symptoms this morning.  EXAM: CHEST  2 VIEW  COMPARISON:  Chest radiograph 12/22/2014.  FINDINGS: LEFT upper lobe airspace opacity is present consistent with pneumonia. Asymmetric/ atypical pulmonary edema is less likely. This is also seen on the lateral view projecting over the upper thoracic spine. The cardiopericardial silhouette is borderline for projection. No pleural effusions. Tortuous thoracic aorta.  IMPRESSION: LEFT upper lobe pneumonia. Followup in 4-6 weeks to ensure radiographic clearing and exclude an underlying lesion is recommended.   Electronically Signed   By: Dereck Ligas M.D.   On: 04/30/2014 15:52   Dg Shoulder Right Port  04/30/2014   CLINICAL DATA:  Right shoulder pain for 2 months. Initial encounter.  EXAM: PORTABLE RIGHT SHOULDER - 2+ VIEW  COMPARISON:  07/20/2003  FINDINGS: There is no evidence of fracture or dislocation. There is no evidence of arthropathy or other focal bone abnormality. Soft tissues are unremarkable.  IMPRESSION: Negative.   Electronically Signed   By: Margarette Canada M.D.   On: 04/30/2014 23:10    Scheduled Meds: . acyclovir  400 mg Oral BID  . azithromycin  500 mg Intravenous Q24H  .  cefTRIAXone (ROCEPHIN)  IV  1 g Intravenous Q24H  . enoxaparin (LOVENOX) injection  40 mg Subcutaneous Q24H   Continuous Infusions: . sodium chloride 75 mL/hr at 05/01/14 1350    Active Problems:   OSA (obstructive sleep apnea)   Essential hypertension, benign   CAP (community acquired pneumonia)   Acute renal failure   Sepsis    Time spent: 25 minutes    [facility] Pager [phone]. If 7PM-7AM, please contact night-coverage at [email or web address], password [facility] 05/01/2014, 4:01 PM  LOS: 1 day

## 2014-05-02 ENCOUNTER — Inpatient Hospital Stay (HOSPITAL_COMMUNITY): Payer: Self-pay

## 2014-05-02 LAB — BASIC METABOLIC PANEL
Anion gap: 3 — ABNORMAL LOW (ref 5–15)
Anion gap: 4 — ABNORMAL LOW (ref 5–15)
BUN: 7 mg/dL (ref 6–23)
BUN: 10 mg/dL (ref 6–23)
CO2: 31 mmol/L (ref 19–32)
CO2: 27 mmol/L (ref 19–32)
CREATININE: 1.36 mg/dL — AB (ref 0.50–1.35)
Calcium: 8.5 mg/dL (ref 8.4–10.5)
Calcium: 8.7 mg/dL (ref 8.4–10.5)
Chloride: 102 mmol/L (ref 96–112)
Chloride: 104 mmol/L (ref 96–112)
Creatinine, Ser: 1.13 mg/dL (ref 0.50–1.35)
GFR calc Af Amer: 85 mL/min — ABNORMAL LOW (ref >90)
GFR calc non Af Amer: 73 mL/min — ABNORMAL LOW (ref >90)
GFR, EST AFRICAN AMERICAN: 68 mL/min — AB (ref >90)
GFR, EST NON AFRICAN AMERICAN: 58 mL/min — AB (ref >90)
Glucose, Bld: 128 mg/dL — ABNORMAL HIGH (ref 70–99)
Glucose, Bld: 141 mg/dL — ABNORMAL HIGH (ref 70–99)
Potassium: 3.8 mmol/L (ref 3.5–5.1)
Potassium: 3.5 mmol/L (ref 3.5–5.1)
Sodium: 136 mmol/L (ref 135–145)
Sodium: 135 mmol/L (ref 135–145)

## 2014-05-02 LAB — CBC
HEMATOCRIT: 41.2 % (ref 39.0–52.0)
HEMOGLOBIN: 14 g/dL (ref 13.0–17.0)
MCH: 30.6 pg (ref 26.0–34.0)
MCHC: 34 g/dL (ref 30.0–36.0)
MCV: 90.2 fL (ref 78.0–100.0)
Platelets: 253 10*3/uL (ref 150–400)
RBC: 4.57 MIL/uL (ref 4.22–5.81)
RDW: 15.3 % (ref 11.5–15.5)
WBC: 14.8 10*3/uL — ABNORMAL HIGH (ref 4.0–10.5)

## 2014-05-02 LAB — URINE CULTURE
COLONY COUNT: NO GROWTH
Culture: NO GROWTH
Special Requests: NORMAL

## 2014-05-02 LAB — LACTIC ACID, PLASMA: Lactic Acid, Venous: 1.6 mmol/L (ref 0.5–2.0)

## 2014-05-02 LAB — LEGIONELLA ANTIGEN, URINE

## 2014-05-02 LAB — TROPONIN I: Troponin I: 0.17 ng/mL — ABNORMAL HIGH (ref <0.031)

## 2014-05-02 MED ORDER — KETOROLAC TROMETHAMINE 30 MG/ML IJ SOLN: 15.0000 mg | Freq: Once | INTRAMUSCULAR | Status: DC

## 2014-05-02 MED ORDER — LORAZEPAM 2 MG/ML IJ SOLN
2.0000 mg | Freq: Once | INTRAMUSCULAR | Status: AC
  Administered 2014-05-02: 2 mg via INTRAVENOUS

## 2014-05-02 MED ORDER — AZITHROMYCIN 500 MG PO TABS
500.0000 mg | ORAL_TABLET | Freq: Every day | ORAL | Status: DC
  Administered 2014-05-02: 500 mg via ORAL
  Filled 2014-05-02 (×3): qty 1

## 2014-05-02 MED ORDER — LORAZEPAM 2 MG/ML IJ SOLN
INTRAMUSCULAR | Status: AC
  Administered 2014-05-02: 2 mg via INTRAVENOUS
  Filled 2014-05-02: qty 1

## 2014-05-02 MED ORDER — TRAMADOL HCL 50 MG PO TABS
100.0000 mg | ORAL_TABLET | Freq: Four times a day (QID) | ORAL | Status: DC | PRN
  Administered 2014-05-02 – 2014-05-03 (×2): 100 mg via ORAL
  Filled 2014-05-02 (×2): qty 2

## 2014-05-02 MED ORDER — MORPHINE SULFATE 2 MG/ML IJ SOLN: 1.0000 mg | INTRAMUSCULAR | Status: DC | PRN

## 2014-05-02 NOTE — Progress Notes (Signed)
[facility] PROGRESS NOTE  Samuel Irwin [id number] DOB: 09/23/1960 DOA: 04/30/2014 PCP: Lorayne Marek, MD INTERIM SUMMARY: 54 y.o male admitted for sob, ws found to have left upper lobe pneumonia.  Assessment/Plan: 1. Sepsis from left upper lobe pneumonia: Improved. He is no longer febrile and his leukocytosis is improving. His breathing has improved.  He was started on IV antibiotics. His oxygen sats are good on RA. Influenza PCR is negative.  Urine for streptococcal antigen is postive. Blood cultures and sputum cultures are negative so far.  Plan to transition him to oral antibiotics in am after today's dose.    2. Smoking cessation counseling given. He reports he only smokes occasionally.    Elevated lactic acid level: repeeat levels in am normal .   Tachycardia: Sinus. Resolved with resolution of the fever.    Hypertension: Well controlled. .   OSA;  on CPAP at home, he is refusing to wear here.    Right shoulder pain, reoccured today, severe in nature and cannot lift his right shoulder. Not relieved with 50 mg of tramadol. Added morphine and tramadol.  Reviewed the X RAY of the  Right shoulder, is negative for fractures or dislocation or arthritis. ordered MRI of the right shoulder.   Acute renal failure: Probably from dehydration and sepsis,  Renal function back to baseline today.   UA is negative for infection. US renal ordered and reviewed, and is negative for hydronephrosis.    Leukocytosis: Persistent and procalcitonin levels show greater than 2, showing sepsis.  No other source of infection found.   Diarrhea: Improved. c idff pcr is negative.   Elevated troponin, :  Ordered when he was having right shoulder pain.  EKG reviewed and  does not show any ischemic changes. NSR.  He denies any chest pain or sob at this time. Probably from sepsis and renal insufficiency.      Code Status: full code.  Family Communication:none at  bedside Disposition Plan: home possibly tomorrow. Awaiting for MRI of the shoulder results.    Consultants:  none  Procedures:  US renal  Antibiotics:  Rocephin and zithromax.   HPI/Subjective: Reports breathing better. But right shoulder pain.   Objective: Filed Vitals:   05/02/14 1421  BP: 115/79  Pulse:   Temp: 98.2 F (36.8 C)  Resp: 16    Intake/Output Summary (Last 24 hours) at 05/02/14 1727 Last data filed at 05/02/14 1421  Gross per 24 hour  Intake    960 ml  Output    375 ml  Net    585 ml   Filed Weights   04/30/14 1234  Weight: 63.504 kg (140 lb)    Exam:   General:  Alert afebrile not in any distress  Cardiovascular: s1s2, no MRG RRR  Respiratory: clear to auscultation, no wheezing or rhonchi  Abdomen: soft non tender non distended bowel sounds heard.   Musculoskeletal: no pedal edema cyanosis or clubbing.   Data Reviewed: Basic Metabolic Panel:  Recent Labs Lab 04/30/14 1246 05/01/14 0549 05/02/14 0003 05/02/14 1108  NA 135 135 136 135  K 3.7 3.5 3.8 3.5  CL 96 101 102 104  CO2 26 24 31 27   GLUCOSE 121* 119* 128* 141*  BUN 13 18 10 7   CREATININE 1.70* 1.56* 1.36* 1.13  CALCIUM 9.3 8.4 8.5 8.7   Liver Function Tests:  Recent Labs Lab 04/30/14 1246  AST 18  ALT 17  ALKPHOS 81  BILITOT 1.6*  PROT 8.3  ALBUMIN 4.3  No results for input(s): LIPASE, AMYLASE in the last 168 hours. No results for input(s): AMMONIA in the last 168 hours. CBC:  Recent Labs Lab 04/30/14 1246 05/01/14 0549 05/02/14 0003  WBC 27.8* 27.5* 14.8*  NEUTROABS 24.0*  --   --   HGB 16.8 14.3 14.0  HCT 48.2 42.5 41.2  MCV 89.6 90.8 90.2  PLT 284 254 253   Cardiac Enzymes:  Recent Labs Lab 05/02/14 0003  TROPONINI 0.17*   BNP (last 3 results) No results for input(s): BNP in the last 8760 hours.  ProBNP (last 3 results) No results for input(s): PROBNP in the last 8760 hours.  CBG: No results for input(s): GLUCAP in the last 168  hours.  Recent Results (from the past 240 hour(s))  Blood culture (routine x 2)     Status: None (Preliminary result)   Collection Time: 04/30/14 12:46 PM  Result Value Ref Range Status   Specimen Description BLOOD RIGHT ARM  Final   Special Requests BOTTLES DRAWN AEROBIC ONLY 3CC  Final   Culture   Final           BLOOD CULTURE RECEIVED NO GROWTH TO DATE CULTURE WILL BE HELD FOR 5 DAYS BEFORE ISSUING A FINAL NEGATIVE REPORT Performed at [facility]    Report Status PENDING  Incomplete  Blood culture (routine x 2)     Status: None (Preliminary result)   Collection Time: 04/30/14  1:30 PM  Result Value Ref Range Status   Specimen Description BLOOD RIGHT HAND  Final   Special Requests BOTTLES DRAWN AEROBIC ONLY 10CC  Final   Culture   Final           BLOOD CULTURE RECEIVED NO GROWTH TO DATE CULTURE WILL BE HELD FOR 5 DAYS BEFORE ISSUING A FINAL NEGATIVE REPORT Performed at [facility]    Report Status PENDING  Incomplete  Urine culture     Status: None   Collection Time: 04/30/14  4:53 PM  Result Value Ref Range Status   Specimen Description URINE, CLEAN CATCH  Final   Special Requests Normal  Final   Colony Count NO GROWTH Performed at [facility]   Final   Culture NO GROWTH Performed at [facility]   Final   Report Status 05/02/2014 FINAL  Final  Clostridium Difficile by PCR     Status: None   Collection Time: 04/30/14 11:59 PM  Result Value Ref Range Status   C difficile by pcr NEGATIVE NEGATIVE Final     Studies: US Renal  05/01/2014   CLINICAL DATA:  Additional dilation acute renal failure sepsis dehydration  EXAM: RENAL/URINARY TRACT ULTRASOUND COMPLETE  COMPARISON:  None.  FINDINGS: Right Kidney:  Length: 11.8 cm. Echogenicity within normal limits. No mass or hydronephrosis visualized.  Left Kidney:  Length: 11.1 cm. Echogenicity within normal limits. No mass or hydronephrosis visualized.  Bladder:  Appears normal for degree of  bladder distention.  IMPRESSION: Normal renal ultrasound   Electronically Signed   By: Skipper Cliche M.D.   On: 05/01/2014 18:03   Dg Shoulder Right Port  04/30/2014   CLINICAL DATA:  Right shoulder pain for 2 months. Initial encounter.  EXAM: PORTABLE RIGHT SHOULDER - 2+ VIEW  COMPARISON:  07/20/2003  FINDINGS: There is no evidence of fracture or dislocation. There is no evidence of arthropathy or other focal bone abnormality. Soft tissues are unremarkable.  IMPRESSION: Negative.   Electronically Signed   By: Cleatis Polka.D.  On: 04/30/2014 23:10    Scheduled Meds: . acyclovir  400 mg Oral BID  . azithromycin  500 mg Oral q1800  . cefTRIAXone (ROCEPHIN)  IV  1 g Intravenous Q24H  . enoxaparin (LOVENOX) injection  40 mg Subcutaneous Q24H  . LORazepam      . LORazepam  2 mg Intravenous Once   Continuous Infusions: . sodium chloride 75 mL/hr at 05/01/14 2026    Active Problems:   OSA (obstructive sleep apnea)   Essential hypertension, benign   CAP (community acquired pneumonia)   Acute renal failure   Sepsis    Time spent: 25 minutes    [place] [facility] Pager [phone]. If 7PM-7AM, please contact night-coverage at [email or web address], password [facility] 05/02/2014, 5:27 PM  LOS: 2 days

## 2014-05-02 NOTE — Progress Notes (Signed)
Patient refused to wear CPAP. Stated he does not wear his CPAP at home. Patient aware to call RT if he changes his mind.

## 2014-05-03 DIAGNOSIS — I10 Essential (primary) hypertension: Secondary | ICD-10-CM

## 2014-05-03 LAB — PROCALCITONIN: PROCALCITONIN: 0.73 ng/mL

## 2014-05-03 MED ORDER — LEVOFLOXACIN 750 MG PO TABS: 750.0000 mg | ORAL_TABLET | Freq: Every day | ORAL | Status: DC

## 2014-05-03 MED ORDER — TRAMADOL HCL 50 MG PO TABS: 100.0000 mg | ORAL_TABLET | Freq: Four times a day (QID) | ORAL | Status: DC | PRN

## 2014-05-03 NOTE — Discharge Summary (Signed)
Physician Discharge Summary  Samuel Irwin [id number] DOB: Jul 12, 1960 DOA: 04/30/2014  PCP: Lorayne Marek, MD  Admit date: 04/30/2014 Discharge date: 05/03/2014  Time spent: 30 minutes  Recommendations for Outpatient Follow-up:  1. Follow up with PCP and orthopedics as recommended.   Discharge Diagnoses:  Active Problems:   OSA (obstructive sleep apnea)   Essential hypertension, benign   CAP (community acquired pneumonia)   Acute renal failure   Sepsis   Discharge Condition: improved  Diet recommendation: low sodium diet.   Filed Weights   04/30/14 1234  Weight: 63.504 kg (140 lb)    History of present illness:  54 y.o male admitted for sob, ws found to have left upper lobe pneumonia.   Hospital Course:  1. Sepsis from left upper lobe pneumonia: Improved. He is no longer febrile and his leukocytosis is improving. His breathing has improved.  He was started on IV antibiotics. His oxygen sats are good on RA. Influenza PCR is negative.  Urine for streptococcal antigen is postive. Blood cultures and sputum cultures are negative so far.  Transition to oral antibiotics on discharge.    2. Smoking cessation counseling given. He reports he only smokes occasionally.    Elevated lactic acid level: repeeat levels in am normal .   Tachycardia: Sinus. Resolved with resolution of the fever.    Hypertension: Well controlled. .   OSA; on CPAP at home, he is refusing to wear here.    Right shoulder pain, reoccured today, severe in nature and cannot lift his right shoulder. Not relieved with 50 mg of tramadol. Added morphine and tramadol. Reviewed the X RAY of the Right shoulder, is negative for fractures or dislocation or arthritis. ordered MRI of the right shoulder. And disccused the results of the MRI with orthopedics recommended outpatient follow up in the office in one week.   Acute renal failure: Probably from dehydration and sepsis,  Renal function back to  baseline today.  UA is negative for infection. US renal ordered and reviewed, and is negative for hydronephrosis.    Leukocytosis: Persistent and procalcitonin levels show greater than 2, showing sepsis.  No other source of infection found. Improved on discharge.   Diarrhea: Improved. c idff pcr is negative.   Elevated troponin, :  Ordered when he was having right shoulder pain. EKG reviewed and does not show any ischemic changes. NSR.  He denies any chest pain or sob at this time. Probably from sepsis and renal insufficiency.   Procedures:  MRI of the shoulder.   Consultations:  orthopedics  Discharge Exam: Filed Vitals:   05/03/14 1345  BP: 131/90  Pulse: 78  Temp: 98.2 F (36.8 C)  Resp: 20    General: alert afebrile comfortable Cardiovascular: s1s2 Respiratory: ctab  Discharge Instructions   Discharge Instructions    Diet - low sodium heart healthy    Complete by:  As directed      Discharge instructions    Complete by:  As directed   Follow up with PCP in one week.  Follow up with orthopedics in one week.          Current Discharge Medication List    START taking these medications   Details  levofloxacin (LEVAQUIN) 750 MG tablet Take 1 tablet (750 mg total) by mouth daily. Qty: 3 tablet, Refills: 0    traMADol (ULTRAM) 50 MG tablet Take 2 tablets (100 mg total) by mouth every 6 (six) hours as needed for severe pain. Qty: 30 tablet,  Refills: 0      CONTINUE these medications which have NOT CHANGED   Details  acyclovir (ZOVIRAX) 400 MG tablet Take 400 mg by mouth 2 (two) times daily.     hydrochlorothiazide (HYDRODIURIL) 25 MG tablet Take 1 tablet (25 mg total) by mouth daily. Qty: 90 tablet, Refills: 3    aspirin EC 81 MG EC tablet Take 1 tablet (81 mg total) by mouth daily.    ergocalciferol (VITAMIN D2) 50000 UNITS capsule Take 1 capsule (50,000 Units total) by mouth once a week. Qty: 12 capsule, Refills: 2      STOP taking these  medications     ibuprofen (ADVIL,MOTRIN) 600 MG tablet      tadalafil (CIALIS) 20 MG tablet        No Known Allergies Follow-up Information    Follow up with Lorayne Marek, MD. Schedule an appointment as soon as possible for a visit in 1 week.   Specialty:  Internal Medicine   Contact information:   [address] [place] [address] [phone]       Follow up with Edmonia Lynch, D, MD. Schedule an appointment as soon as possible for a visit in 1 week.   Specialty:  Orthopedic Surgery   Contact information:   [address] [place] [address] [phone]        The results of significant diagnostics from this hospitalization (including imaging, microbiology, ancillary and laboratory) are listed below for reference.    Significant Diagnostic Studies: Dg Chest 2 View  04/30/2014   CLINICAL DATA:  Body aches. Headache. Dry cough. Initial encounter. Onset of symptoms this morning.  EXAM: CHEST  2 VIEW  COMPARISON:  Chest radiograph 12/22/2014.  FINDINGS: LEFT upper lobe airspace opacity is present consistent with pneumonia. Asymmetric/ atypical pulmonary edema is less likely. This is also seen on the lateral view projecting over the upper thoracic spine. The cardiopericardial silhouette is borderline for projection. No pleural effusions. Tortuous thoracic aorta.  IMPRESSION: LEFT upper lobe pneumonia. Followup in 4-6 weeks to ensure radiographic clearing and exclude an underlying lesion is recommended.   Electronically Signed   By: Dereck Ligas M.D.   On: 04/30/2014 15:52   US Renal  05/01/2014   CLINICAL DATA:  Additional dilation acute renal failure sepsis dehydration  EXAM: RENAL/URINARY TRACT ULTRASOUND COMPLETE  COMPARISON:  None.  FINDINGS: Right Kidney:  Length: 11.8 cm. Echogenicity within normal limits. No mass or hydronephrosis visualized.  Left Kidney:  Length: 11.1 cm. Echogenicity within normal limits. No mass or hydronephrosis visualized.   Bladder:  Appears normal for degree of bladder distention.  IMPRESSION: Normal renal ultrasound   Electronically Signed   By: Skipper Cliche M.D.   On: 05/01/2014 18:03   Mr Shoulder Right Wo Contrast  05/02/2014   CLINICAL DATA:  Chronic right shoulder pain.  EXAM: MRI OF THE RIGHT SHOULDER WITHOUT CONTRAST  TECHNIQUE: Multiplanar, multisequence MR imaging of the shoulder was performed. No intravenous contrast was administered.  COMPARISON:  Radiographs 04/30/2014  FINDINGS: Examination is limited due to patient motion. Patient could not complete the examination.  Rotator cuff: Moderate rotator cuff tendinopathy/ tendinosis with interstitial tears and bursal and articular surface fraying and fibrillation. No discrete full-thickness retracted rotator cuff tear.  Muscles:  Grossly normal.  Biceps long head: Not demonstrated on and any of the imaging planes. I suspect it is torn and retracted.  Acromioclavicular Joint: Moderate AC joint degenerative changes. The acromion is type 2 in  shape. No lateral downsloping or definite undersurface spurring.  Glenohumeral Joint: Mild degenerative changes. Small joint effusion.  Labrum: The superior labrum is degenerated and torn. The anterior and posterior labrum grossly normal. Examination is limited however due to patient motion.  Bones:  No acute bony findings.  Other: Moderate subacromial/ subdeltoid bursitis.  IMPRESSION: Limited examination due to patient motion.  Moderate rotator cuff tendinopathy/tendinosis with interstitial tears and bursal and articular surface fraying and fibrillation.  Suspect long head biceps tendon rupture.  Degenerated and torn superior labrum.   Electronically Signed   By: Marijo Sanes M.D.   On: 05/02/2014 17:32   Dg Shoulder Right Port  04/30/2014   CLINICAL DATA:  Right shoulder pain for 2 months. Initial encounter.  EXAM: PORTABLE RIGHT SHOULDER - 2+ VIEW  COMPARISON:  07/20/2003  FINDINGS: There is no evidence of fracture or  dislocation. There is no evidence of arthropathy or other focal bone abnormality. Soft tissues are unremarkable.  IMPRESSION: Negative.   Electronically Signed   By: Margarette Canada M.D.   On: 04/30/2014 23:10    Microbiology: Recent Results (from the past 240 hour(s))  Blood culture (routine x 2)     Status: None (Preliminary result)   Collection Time: 04/30/14 12:46 PM  Result Value Ref Range Status   Specimen Description BLOOD RIGHT ARM  Final   Special Requests BOTTLES DRAWN AEROBIC ONLY 3CC  Final   Culture   Final           BLOOD CULTURE RECEIVED NO GROWTH TO DATE CULTURE WILL BE HELD FOR 5 DAYS BEFORE ISSUING A FINAL NEGATIVE REPORT Performed at [facility]    Report Status PENDING  Incomplete  Blood culture (routine x 2)     Status: None (Preliminary result)   Collection Time: 04/30/14  1:30 PM  Result Value Ref Range Status   Specimen Description BLOOD RIGHT HAND  Final   Special Requests BOTTLES DRAWN AEROBIC ONLY 10CC  Final   Culture   Final           BLOOD CULTURE RECEIVED NO GROWTH TO DATE CULTURE WILL BE HELD FOR 5 DAYS BEFORE ISSUING A FINAL NEGATIVE REPORT Performed at [facility]    Report Status PENDING  Incomplete  Urine culture     Status: None   Collection Time: 04/30/14  4:53 PM  Result Value Ref Range Status   Specimen Description URINE, CLEAN CATCH  Final   Special Requests Normal  Final   Colony Count NO GROWTH Performed at [facility]   Final   Culture NO GROWTH Performed at [facility]   Final   Report Status 05/02/2014 FINAL  Final  Clostridium Difficile by PCR     Status: None   Collection Time: 04/30/14 11:59 PM  Result Value Ref Range Status   C difficile by pcr NEGATIVE NEGATIVE Final     Labs: Basic Metabolic Panel:  Recent Labs Lab 04/30/14 1246 05/01/14 0549 05/02/14 0003 05/02/14 1108  NA 135 135 136 135  K 3.7 3.5 3.8 3.5  CL 96 101 102 104  CO2 26 24 31 27   GLUCOSE 121* 119* 128* 141*  BUN  13 18 10 7   CREATININE 1.70* 1.56* 1.36* 1.13  CALCIUM 9.3 8.4 8.5 8.7   Liver Function Tests:  Recent Labs Lab 04/30/14 1246  AST 18  ALT 17  ALKPHOS 81  BILITOT 1.6*  PROT 8.3  ALBUMIN 4.3   No results for input(s):  LIPASE, AMYLASE in the last 168 hours. No results for input(s): AMMONIA in the last 168 hours. CBC:  Recent Labs Lab 04/30/14 1246 05/01/14 0549 05/02/14 0003  WBC 27.8* 27.5* 14.8*  NEUTROABS 24.0*  --   --   HGB 16.8 14.3 14.0  HCT 48.2 42.5 41.2  MCV 89.6 90.8 90.2  PLT 284 254 253   Cardiac Enzymes:  Recent Labs Lab 05/02/14 0003  TROPONINI 0.17*   BNP: BNP (last 3 results) No results for input(s): BNP in the last 8760 hours.  ProBNP (last 3 results) No results for input(s): PROBNP in the last 8760 hours.  CBG: No results for input(s): GLUCAP in the last 168 hours.     SignedHosie Poisson  [facility] 05/03/2014, 2:39 PM

## 2014-05-06 LAB — CULTURE, BLOOD (ROUTINE X 2)
CULTURE: NO GROWTH
Culture: NO GROWTH

## 2014-05-10 ENCOUNTER — Encounter: Payer: Self-pay | Admitting: Internal Medicine

## 2014-05-10 ENCOUNTER — Ambulatory Visit: Payer: Self-pay | Attending: Internal Medicine | Admitting: Internal Medicine

## 2014-05-10 VITALS — BP 143/93 | HR 77 | Temp 98.3°F | Resp 16 | Ht 68.0 in | Wt 240.0 lb

## 2014-05-10 DIAGNOSIS — J189 Pneumonia, unspecified organism: Secondary | ICD-10-CM

## 2014-05-10 DIAGNOSIS — Z720 Tobacco use: Secondary | ICD-10-CM | POA: Insufficient documentation

## 2014-05-10 DIAGNOSIS — R200 Anesthesia of skin: Secondary | ICD-10-CM

## 2014-05-10 DIAGNOSIS — Z139 Encounter for screening, unspecified: Secondary | ICD-10-CM

## 2014-05-10 DIAGNOSIS — I10 Essential (primary) hypertension: Secondary | ICD-10-CM

## 2014-05-10 DIAGNOSIS — M67911 Unspecified disorder of synovium and tendon, right shoulder: Secondary | ICD-10-CM

## 2014-05-10 DIAGNOSIS — IMO0001 Reserved for inherently not codable concepts without codable children: Secondary | ICD-10-CM

## 2014-05-10 DIAGNOSIS — R202 Paresthesia of skin: Secondary | ICD-10-CM

## 2014-05-10 DIAGNOSIS — M75111 Incomplete rotator cuff tear or rupture of right shoulder, not specified as traumatic: Secondary | ICD-10-CM | POA: Insufficient documentation

## 2014-05-10 DIAGNOSIS — F17200 Nicotine dependence, unspecified, uncomplicated: Secondary | ICD-10-CM

## 2014-05-10 LAB — COMPLETE METABOLIC PANEL WITH GFR
ALT: 28 U/L (ref 0–53)
AST: 16 U/L (ref 0–37)
Albumin: 3.9 g/dL (ref 3.5–5.2)
Alkaline Phosphatase: 70 U/L (ref 39–117)
BILIRUBIN TOTAL: 0.5 mg/dL (ref 0.2–1.2)
BUN: 14 mg/dL (ref 6–23)
CO2: 26 mEq/L (ref 19–32)
Calcium: 9.9 mg/dL (ref 8.4–10.5)
Chloride: 102 mEq/L (ref 96–112)
Creat: 1.19 mg/dL (ref 0.50–1.35)
GFR, EST AFRICAN AMERICAN: 81 mL/min
GFR, Est Non African American: 70 mL/min
Glucose, Bld: 90 mg/dL (ref 70–99)
Potassium: 4.5 mEq/L (ref 3.5–5.3)
Sodium: 139 mEq/L (ref 135–145)
Total Protein: 7.5 g/dL (ref 6.0–8.3)

## 2014-05-10 LAB — VITAMIN B12: VITAMIN B 12: 331 pg/mL (ref 211–911)

## 2014-05-10 NOTE — Patient Instructions (Signed)
DASH Eating Plan °DASH stands for "Dietary Approaches to Stop Hypertension." The DASH eating plan is a healthy eating plan that has been shown to reduce high blood pressure (hypertension). Additional health benefits may include reducing the risk of type 2 diabetes mellitus, heart disease, and stroke. The DASH eating plan may also help with weight loss. °WHAT DO I NEED TO KNOW ABOUT THE DASH EATING PLAN? °For the DASH eating plan, you will follow these general guidelines: °· Choose foods with a percent daily value for sodium of less than 5% (as listed on the food label). °· Use salt-free seasonings or herbs instead of table salt or sea salt. °· Check with your health care provider or pharmacist before using salt substitutes. °· Eat lower-sodium products, often labeled as "lower sodium" or "no salt added." °· Eat fresh foods. °· Eat more vegetables, fruits, and low-fat dairy products. °· Choose whole grains. Look for the word "whole" as the first word in the ingredient list. °· Choose fish and skinless chicken or turkey more often than red meat. Limit fish, poultry, and meat to 6 oz (170 g) each day. °· Limit sweets, desserts, sugars, and sugary drinks. °· Choose heart-healthy fats. °· Limit cheese to 1 oz (28 g) per day. °· Eat more home-cooked food and less restaurant, buffet, and fast food. °· Limit fried foods. °· Cook foods using methods other than frying. °· Limit canned vegetables. If you do use them, rinse them well to decrease the sodium. °· When eating at a restaurant, ask that your food be prepared with less salt, or no salt if possible. °WHAT FOODS CAN I EAT? °Seek help from a dietitian for individual calorie needs. °Grains °Whole grain or whole wheat bread. Brown rice. Whole grain or whole wheat pasta. Quinoa, bulgur, and whole grain cereals. Low-sodium cereals. Corn or whole wheat flour tortillas. Whole grain cornbread. Whole grain crackers. Low-sodium crackers. °Vegetables °Fresh or frozen vegetables  (raw, steamed, roasted, or grilled). Low-sodium or reduced-sodium tomato and vegetable juices. Low-sodium or reduced-sodium tomato sauce and paste. Low-sodium or reduced-sodium canned vegetables.  °Fruits °All fresh, canned (in natural juice), or frozen fruits. °Meat and Other Protein Products °Ground beef (85% or leaner), grass-fed beef, or beef trimmed of fat. Skinless chicken or turkey. Ground chicken or turkey. Pork trimmed of fat. All fish and seafood. Eggs. Dried beans, peas, or lentils. Unsalted nuts and seeds. Unsalted canned beans. °Dairy °Low-fat dairy products, such as skim or 1% milk, 2% or reduced-fat cheeses, low-fat ricotta or cottage cheese, or plain low-fat yogurt. Low-sodium or reduced-sodium cheeses. °Fats and Oils °Tub margarines without trans fats. Light or reduced-fat mayonnaise and salad dressings (reduced sodium). Avocado. Safflower, olive, or canola oils. Natural peanut or almond butter. °Other °Unsalted popcorn and pretzels. °The items listed above may not be a complete list of recommended foods or beverages. Contact your dietitian for more options. °WHAT FOODS ARE NOT RECOMMENDED? °Grains °White bread. White pasta. White rice. Refined cornbread. Bagels and croissants. Crackers that contain trans fat. °Vegetables °Creamed or fried vegetables. Vegetables in a cheese sauce. Regular canned vegetables. Regular canned tomato sauce and paste. Regular tomato and vegetable juices. °Fruits °Dried fruits. Canned fruit in light or heavy syrup. Fruit juice. °Meat and Other Protein Products °Fatty cuts of meat. Ribs, chicken wings, bacon, sausage, bologna, salami, chitterlings, fatback, hot dogs, bratwurst, and packaged luncheon meats. Salted nuts and seeds. Canned beans with salt. °Dairy °Whole or 2% milk, cream, half-and-half, and cream cheese. Whole-fat or sweetened yogurt. Full-fat   cheeses or blue cheese. Nondairy creamers and whipped toppings. Processed cheese, cheese spreads, or cheese  curds. °Condiments °Onion and garlic salt, seasoned salt, table salt, and sea salt. Canned and packaged gravies. Worcestershire sauce. Tartar sauce. Barbecue sauce. Teriyaki sauce. Soy sauce, including reduced sodium. Steak sauce. Fish sauce. Oyster sauce. Cocktail sauce. Horseradish. Ketchup and mustard. Meat flavorings and tenderizers. Bouillon cubes. Hot sauce. Tabasco sauce. Marinades. Taco seasonings. Relishes. °Fats and Oils °Butter, stick margarine, lard, shortening, ghee, and bacon fat. Coconut, palm kernel, or palm oils. Regular salad dressings. °Other °Pickles and olives. Salted popcorn and pretzels. °The items listed above may not be a complete list of foods and beverages to avoid. Contact your dietitian for more information. °WHERE CAN I FIND MORE INFORMATION? °National Heart, Lung, and Blood Institute: www.nhlbi.nih.gov/health/health-topics/topics/dash/ °Document Released: 01/31/2011 Document Revised: 06/28/2013 Document Reviewed: 12/16/2012 °ExitCare® Patient Information ©2015 [employer]. This information is not intended to replace advice given to you by your health care provider. Make sure you discuss any questions you have with your health care provider. ° °

## 2014-05-10 NOTE — Progress Notes (Signed)
Pt is here following up on his HTN. Pt is requesting a prostate exam. Pt recently was in the ED with pneumonia. Pt is c/o right shoulder pain.

## 2014-05-10 NOTE — Progress Notes (Signed)
MRN: [id number] Name: Samuel Irwin  Sex: male Age: 54 y.o. DOB: [birth year]  Allergies: Review of patient's allergies indicates no known allergies.  Chief Complaint  Patient presents with  . Follow-up    HPI: Patient is 54 y.o. male who history of hypertension, hyperlipidemia comes today for followup patient was recently hospitalized and was treated for community-acquired pneumonia which patient completed a course of antibiotics denies any fever chills, also has history of right shoulder pain and had MRI done which reported rotator cuff  Tendinopathy/tear, patient has not followed up with orthopedics, he also has ? family history of prostate problem , patient declines rectal examination, would like to have the PSA checked, denies any urinary frequency urgency.patient complains of occasional numbness tingling in the hands.  Past Medical History  Diagnosis Date  . GERD (gastroesophageal reflux disease)   . Umbilical hernia   . Constipation   . Cough   . Wheezing   . Weakness   . Generalized headaches   . Heart murmur   . H/O hiatal hernia   . Herpes     Past Surgical History  Procedure Laterality Date  . Femur surgery      left  . Toe surgery      dislocated rt foot  . Carpal tunnel release      R hand  . Umbilical hernia repair N/A 04/07/2012    Procedure: HERNIA REPAIR UMBILICAL ADULT;  Surgeon: Madilyn Hook, DO;  Location: [place];  Service: General;  Laterality: N/A;  open umbilical hernia repair with mesh  . Insertion of mesh N/A 04/07/2012    Procedure: INSERTION OF MESH;  Surgeon: Madilyn Hook, DO;  Location: [place];  Service: General;  Laterality: N/A;  . Hernia repair  04/07/12    umb hernia repair      Medication List       This list is accurate as of: 05/10/14  3:59 PM.  Always use your most recent med list.               acyclovir 400 MG tablet  Commonly known as:  ZOVIRAX  Take 400 mg by mouth 2 (two) times daily.     aspirin 81 MG EC tablet    Take 1 tablet (81 mg total) by mouth daily.     ergocalciferol 50000 UNITS capsule  Commonly known as:  VITAMIN D2  Take 1 capsule (50,000 Units total) by mouth once a week.     hydrochlorothiazide 25 MG tablet  Commonly known as:  HYDRODIURIL  Take 1 tablet (25 mg total) by mouth daily.     levofloxacin 750 MG tablet  Commonly known as:  LEVAQUIN  Take 1 tablet (750 mg total) by mouth daily.     traMADol 50 MG tablet  Commonly known as:  ULTRAM  Take 2 tablets (100 mg total) by mouth every 6 (six) hours as needed for severe pain.        No orders of the defined types were placed in this encounter.    Immunization History  Administered Date(s) Administered  . Influenza,inj,Quad PF,36+ Mos 12/22/2013  . Pneumococcal Polysaccharide-23 12/22/2013    Family History  Problem Relation Age of Onset  . Cancer Father     lung    History  Substance Use Topics  . Smoking status: Current Some Day Smoker -- 0.25 packs/day for 15 years    Types: Cigarettes  . Smokeless tobacco: Not on file     Comment:  occas still smokes when he drinks  . Alcohol Use: 0.0 oz/week    0 Standard drinks or equivalent per week     Comment: 3 40oz beers twice a week    Review of Systems   As noted in HPI  Filed Vitals:   05/10/14 1453  BP: 143/93  Pulse: 77  Temp: 98.3 F (36.8 C)  Resp: 16    Physical Exam  Physical Exam  Constitutional: No distress.  Eyes: EOM are normal. Pupils are equal, round, and reactive to light.  Cardiovascular: Normal rate and regular rhythm.   Pulmonary/Chest: Breath sounds normal. No respiratory distress. He has no wheezes. He has no rales.  Abdominal: Soft. There is no tenderness.  Musculoskeletal:  Right shoulder tenderness anteriorly some limitation with full range of motion with extension.    CBC    Component Value Date/Time   WBC 14.8* 05/02/2014 0003   RBC 4.57 05/02/2014 0003   HGB 14.0 05/02/2014 0003   HCT 41.2 05/02/2014 0003   PLT  253 05/02/2014 0003   MCV 90.2 05/02/2014 0003   LYMPHSABS 1.9 04/30/2014 1246   MONOABS 1.9* 04/30/2014 1246   EOSABS 0.0 04/30/2014 1246   BASOSABS 0.0 04/30/2014 1246    CMP     Component Value Date/Time   NA 135 05/02/2014 1108   K 3.5 05/02/2014 1108   CL 104 05/02/2014 1108   CO2 27 05/02/2014 1108   GLUCOSE 141* 05/02/2014 1108   BUN 7 05/02/2014 1108   CREATININE 1.13 05/02/2014 1108   CREATININE 1.14 03/09/2014 1241   CALCIUM 8.7 05/02/2014 1108   PROT 8.3 04/30/2014 1246   ALBUMIN 4.3 04/30/2014 1246   AST 18 04/30/2014 1246   ALT 17 04/30/2014 1246   ALKPHOS 81 04/30/2014 1246   BILITOT 1.6* 04/30/2014 1246   GFRNONAA 73* 05/02/2014 1108   GFRNONAA 74 03/09/2014 1241   GFRAA 85* 05/02/2014 1108   GFRAA 86 03/09/2014 1241    Lab Results  Component Value Date/Time   CHOL 185 03/09/2014 12:41 PM    No components found for: HGA1C  Lab Results  Component Value Date/Time   AST 18 04/30/2014 12:46 PM    Assessment and Plan  Essential hypertension, benign - Plan:blood pressure is borderline elevated, advise patient for DASH diet, continue with current medication COMPLETE METABOLIC PANEL WITH GFR  CAP (community acquired pneumonia) Patient has already been treated and completed the course of antibiotic.  Tendinopathy of rotator cuff, right - Plan: Ambulatory referral to Orthopedic Surgery  Smoking Counseled patient to quit smoking.  Screening - Plan: PSA  Numbness and tingling - Plan: Vitamin B12, Vit D  25 hydroxy (rtn osteoporosis monitoring)   Health Maintenance  -Vaccinations:  Up-to-date with flu shot and Pneumovax  Return in about 3 months (around 08/10/2014) for hypertension.   This note has been created with Surveyor, quantity. Any transcriptional errors are unintentional.    Lorayne Marek, MD

## 2014-05-11 ENCOUNTER — Telehealth: Payer: Self-pay

## 2014-05-11 LAB — VITAMIN D 25 HYDROXY (VIT D DEFICIENCY, FRACTURES): Vit D, 25-Hydroxy: 17 ng/mL — ABNORMAL LOW (ref 30–100)

## 2014-05-11 LAB — PSA: PSA: 0.55 ng/mL (ref <=4.00)

## 2014-05-11 MED ORDER — VITAMIN D (ERGOCALCIFEROL) 1.25 MG (50000 UNIT) PO CAPS: 50000.0000 [IU] | ORAL_CAPSULE | ORAL | Status: DC

## 2014-05-11 NOTE — Telephone Encounter (Signed)
Patient not available Left message on voice mail to return our call 

## 2014-05-11 NOTE — Telephone Encounter (Signed)
-----   Message from Lorayne Marek, MD sent at 05/11/2014  9:57 AM EDT ----- Blood work reviewed, noticed low vitamin D, call patient advise to start ergocalciferol 50,000 units once a week for the duration of  12 weeks. Also noted  vitamin B12 in the low normal range, advise patient to take  over-the-counter cyanocobalamin 1000 mcg daily

## 2014-05-12 ENCOUNTER — Telehealth: Payer: Self-pay

## 2014-05-12 ENCOUNTER — Telehealth: Payer: Self-pay | Admitting: Internal Medicine

## 2014-05-12 NOTE — Telephone Encounter (Signed)
Returned patient phone call Patient not available Can not leave message-mailbox full

## 2014-05-12 NOTE — Telephone Encounter (Signed)
Pt is here to inquire about his results. Pt says it is okay to leave his results in his voicemail if he does not answer. Please follow up with pt.

## 2014-05-13 NOTE — Telephone Encounter (Signed)
Attempted to notify patient of lab results, however, patient's VM box is full and could not leave message.

## 2014-05-16 ENCOUNTER — Telehealth: Payer: Self-pay | Admitting: General Practice

## 2014-05-16 NOTE — Telephone Encounter (Signed)
Patient presents to clinic to inquire about lab results. Pt says he has missed quite a few calls from the nurse, states that it is okay to leave his results on his voicemail if he does not answer. Please assist

## 2014-05-18 ENCOUNTER — Other Ambulatory Visit: Payer: Self-pay

## 2014-05-18 ENCOUNTER — Ambulatory Visit: Payer: Self-pay | Attending: Internal Medicine | Admitting: Family Medicine

## 2014-05-18 VITALS — BP 129/87 | HR 71 | Temp 98.3°F | Ht 70.0 in | Wt 248.2 lb

## 2014-05-18 DIAGNOSIS — R229 Localized swelling, mass and lump, unspecified: Secondary | ICD-10-CM

## 2014-05-18 DIAGNOSIS — IMO0002 Reserved for concepts with insufficient information to code with codable children: Secondary | ICD-10-CM

## 2014-05-18 MED ORDER — ACYCLOVIR 400 MG PO TABS: 400.0000 mg | ORAL_TABLET | Freq: Two times a day (BID) | ORAL | Status: DC

## 2014-05-18 NOTE — Patient Instructions (Signed)
The soreness is most likel due to number of sit-up.  The knot is most likely related to shots in abdomen given in hospital. Return is anything worsen. As long as knot is not enlarging do not worry.

## 2014-05-18 NOTE — Progress Notes (Signed)
Patient ID: Samuel Irwin, male   DOB: September 30, 1960, 54 y.o.   MRN: [id number]    Chief c/o:  Knot on abdomen.  Subjective:  Patient presents today because of a knot he found on this abdomen this am. He is concerned.  He was very recently in hospital and was given injection into abdomen to prevent blood clots.He also does a lot of sit-ups and is concerned if is related to that. He denies any other symptoms.   Objective:  There is a 3/4'' roundish nodule in the subq tissue of the right nodule. This is consistent with having been given injections there.  Assessment:  SubQ nodule.  Plan:  Keep an eye on this and follow-up if enlarges for becomes tender.   Micheline Chapman, FNP,BC

## 2014-06-06 ENCOUNTER — Other Ambulatory Visit: Payer: Self-pay

## 2014-06-06 MED ORDER — TADALAFIL 20 MG PO TABS: 20.0000 mg | ORAL_TABLET | Freq: Every day | ORAL | Status: DC | PRN

## 2014-06-14 ENCOUNTER — Telehealth: Payer: Self-pay | Admitting: Internal Medicine

## 2014-06-14 ENCOUNTER — Other Ambulatory Visit: Payer: Self-pay | Admitting: Internal Medicine

## 2014-06-14 NOTE — Telephone Encounter (Signed)
Pt calling for refill on bp meds and vit D, pt's last PCP appt was on 05/10/14, please f/u with pt.

## 2014-06-17 ENCOUNTER — Telehealth: Payer: Self-pay

## 2014-06-17 NOTE — Telephone Encounter (Signed)
Patient called back returning nurse call.  Transferred nurse call

## 2014-06-17 NOTE — Telephone Encounter (Signed)
Returned patient phone call Patient not available Left message on voice mail to return our call 

## 2014-06-22 ENCOUNTER — Telehealth: Payer: Self-pay | Admitting: Pulmonary Disease

## 2014-06-22 DIAGNOSIS — G4733 Obstructive sleep apnea (adult) (pediatric): Secondary | ICD-10-CM

## 2014-06-22 NOTE — Telephone Encounter (Signed)
Spoke with pt. States that his BiPAP is causing him the same problems he had with CPAP. Wonders if his pressure needs to be changed. Also needs a letter excusing him from jury duty. He was to report yesterday but he slept through it.  [place] - please advise. Thanks.

## 2014-06-22 NOTE — Telephone Encounter (Signed)
Let pt know that we will change the pressure on his machine.  Please send order to change bipap to :  Auto 12-20 with PS of 4.    Letter done.

## 2014-06-22 NOTE — Telephone Encounter (Signed)
The machine is not blowing air out, he ends up waking up choking and pulls mask off.  Patient says that he just needs a note saying that he has sleep apnea.  He wants to pick it up tomorrow.

## 2014-06-22 NOTE — Telephone Encounter (Signed)
Duplicate message. See telephone encounter 06/22/14.

## 2014-06-22 NOTE — Telephone Encounter (Signed)
I need to know specifically what issues he is having with bipap.  If we change his pressures, will increase not decrease. Cannot write EXCUSE for jury duty.  Can verify he has sleep apnea only.

## 2014-06-23 ENCOUNTER — Telehealth: Payer: Self-pay | Admitting: Pulmonary Disease

## 2014-06-23 NOTE — Telephone Encounter (Signed)
Called pt and is aware. Order placed. Letter also placed for pick up. Copy of letter placed in scan folder

## 2014-06-23 NOTE — Telephone Encounter (Signed)
Duplicate. See prior phone note

## 2014-07-01 ENCOUNTER — Other Ambulatory Visit: Payer: Self-pay | Admitting: *Deleted

## 2014-07-01 MED ORDER — ACYCLOVIR 400 MG PO TABS: 400.0000 mg | ORAL_TABLET | Freq: Two times a day (BID) | ORAL | Status: DC

## 2014-07-05 ENCOUNTER — Telehealth: Payer: Self-pay

## 2014-07-05 NOTE — Telephone Encounter (Signed)
Pt had left a VM to call to schedule the colonoscopy. I called and could not leave a message, vm is full.

## 2014-07-06 ENCOUNTER — Other Ambulatory Visit: Payer: Self-pay

## 2014-07-07 NOTE — Telephone Encounter (Signed)
Letter mailed for pt to call.  

## 2014-07-15 ENCOUNTER — Telehealth: Payer: Self-pay | Admitting: Pulmonary Disease

## 2014-07-15 DIAGNOSIS — G4733 Obstructive sleep apnea (adult) (pediatric): Secondary | ICD-10-CM

## 2014-07-15 NOTE — Telephone Encounter (Signed)
lmtcb X1 for [employer]

## 2014-07-18 NOTE — Telephone Encounter (Signed)
lmtcb for [name].  

## 2014-07-18 NOTE — Telephone Encounter (Signed)
When I ordered his bilevel device from the sleep foundation in 02/2014, I ordered a bipap auto device.  How come he did not get this device?  Please have [facility] check into this.  I think auto bipap is a choice on the sleep foundation ordering form.

## 2014-07-18 NOTE — Telephone Encounter (Signed)
Spoke with [name] at [facility], states that the bipap that the pt has is from an assistance program and is not capable of doing autotitrating settings, can only be a set standard setting.   [place] please advise if the order for pt's bipap needs to be changed.  Thanks!

## 2014-07-18 NOTE — Telephone Encounter (Signed)
PCC's please advise on the below.  Thanks!

## 2014-07-18 NOTE — Telephone Encounter (Signed)
[name] returned call [phone] °

## 2014-07-20 NOTE — Telephone Encounter (Signed)
Sent message for Samuel Irwin to call me Joellen Jersey

## 2014-07-20 NOTE — Telephone Encounter (Signed)
Machine pt got from the cpap foundation was not a auto bipap even though that is what you put that is not what he got he got a phillips respironic bipap st it can do standard bipap and cpap settings but not auto so what now?

## 2014-07-21 NOTE — Telephone Encounter (Signed)
Order placed. Nothing further needed at this time. 

## 2014-07-21 NOTE — Telephone Encounter (Signed)
Why don't we send an order to advanced to put him on auto bipap 5-20cm with ps 4.  Would do this as a loaner for 2 weeks with a download to me afterward.  Let him know this is the only way to optimize his pressure.

## 2014-07-26 ENCOUNTER — Ambulatory Visit: Payer: Self-pay | Admitting: Nurse Practitioner

## 2014-07-26 ENCOUNTER — Encounter: Payer: Self-pay | Admitting: Nurse Practitioner

## 2014-07-26 ENCOUNTER — Encounter: Payer: Self-pay | Admitting: Gastroenterology

## 2014-08-08 ENCOUNTER — Telehealth: Payer: Self-pay | Admitting: Internal Medicine

## 2014-08-08 ENCOUNTER — Telehealth: Payer: Self-pay | Admitting: Pulmonary Disease

## 2014-08-08 MED ORDER — ACYCLOVIR 400 MG PO TABS: 400.0000 mg | ORAL_TABLET | Freq: Two times a day (BID) | ORAL | Status: DC

## 2014-08-08 NOTE — Telephone Encounter (Signed)
Samuel Irwin called back and she is going to check on this. Will await call back

## 2014-08-08 NOTE — Telephone Encounter (Signed)
Patient has come in today to request a medication refill on Acyclovir 400mg ; please f/u with patient about this request

## 2014-08-08 NOTE — Telephone Encounter (Signed)
Prescription done

## 2014-08-08 NOTE — Telephone Encounter (Signed)
Per previous phone message; Kathee Delton, MD at 07/21/2014 8:39 AM     Status: Signed       Expand All Collapse All   Why don't we send an order to advanced to put him on auto bipap 5-20cm with ps 4. Would do this as a loaner for 2 weeks with a download to me afterward. Let him know this is the only way to optimize his pressure      --  Called spoke with pt. He reports he tried the loaner and did not work for him. He reports he has had the loaner for a while but makes him feel like he is choking. He reports he still has the loaner. He was not clear on what exactly he needed.  I called melissa to see 1) does pt still have the loaner machine? If not, can they send download over on pt?  Called LMTCB x1

## 2014-08-09 ENCOUNTER — Other Ambulatory Visit: Payer: Self-pay

## 2014-08-09 ENCOUNTER — Telehealth: Payer: Self-pay

## 2014-08-09 NOTE — Telephone Encounter (Signed)
Returned patient phone call Patient not available Left message on voice mail to return our call 

## 2014-08-11 NOTE — Telephone Encounter (Signed)
Spoke with Lenna Sciara- states that pt has an [employer] unit which is unable to do autotitrating settings.  Was told that he could use a loaner cpap through them for a $50 fee and would be charged for the device if he didn't return the device to [facility].  Pt declined this offer, so he never received an autotitrating machine for his download.  Sending to Dr. Halford Chessman as pt was not assigned a provider after [facility] has left.  Please advise.  Thanks!

## 2014-08-11 NOTE — Telephone Encounter (Signed)
Spoke with Aiyana at [facility], will request the respiratory dept to fax a download to our office to front fax at Dr. Durwin Nora attn.    Forwarding to Ashtyn to look out for.

## 2014-08-11 NOTE — Telephone Encounter (Signed)
Please have download sent from his current device.  Will then determine what adjustments need to be made to his set up.

## 2014-08-12 ENCOUNTER — Ambulatory Visit: Payer: MEDICAID | Attending: Family Medicine | Admitting: Family Medicine

## 2014-08-12 ENCOUNTER — Encounter: Payer: Self-pay | Admitting: Family Medicine

## 2014-08-12 VITALS — BP 136/79 | HR 93 | Temp 97.5°F | Ht 69.0 in | Wt 245.0 lb

## 2014-08-12 DIAGNOSIS — I10 Essential (primary) hypertension: Secondary | ICD-10-CM | POA: Insufficient documentation

## 2014-08-12 DIAGNOSIS — R443 Hallucinations, unspecified: Secondary | ICD-10-CM | POA: Insufficient documentation

## 2014-08-12 DIAGNOSIS — G5602 Carpal tunnel syndrome, left upper limb: Secondary | ICD-10-CM | POA: Insufficient documentation

## 2014-08-12 DIAGNOSIS — Z720 Tobacco use: Secondary | ICD-10-CM | POA: Insufficient documentation

## 2014-08-12 DIAGNOSIS — G5600 Carpal tunnel syndrome, unspecified upper limb: Secondary | ICD-10-CM | POA: Insufficient documentation

## 2014-08-12 MED ORDER — TRAMADOL HCL 50 MG PO TABS: 50.0000 mg | ORAL_TABLET | Freq: Four times a day (QID) | ORAL | Status: DC | PRN

## 2014-08-12 MED ORDER — PREDNISONE 10 MG PO TABS: 10.0000 mg | ORAL_TABLET | Freq: Every day | ORAL | Status: DC
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
## 2014-08-12 NOTE — Telephone Encounter (Signed)
Ashtyn, has you received this download?
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
## 2014-08-12 NOTE — Patient Instructions (Signed)
Carpal Tunnel Syndrome The carpal tunnel is a narrow area located on the palm side of your wrist. The tunnel is formed by the wrist bones and ligaments. Nerves, blood vessels, and tendons pass through the carpal tunnel. Repeated wrist motion or certain diseases may cause swelling within the tunnel. This swelling pinches the main nerve in the wrist (median nerve) and causes the painful hand and arm condition called carpal tunnel syndrome. CAUSES   Repeated wrist motions.  Wrist injuries.  Certain diseases like arthritis, diabetes, alcoholism, hyperthyroidism, and kidney failure.  Obesity.  Pregnancy. SYMPTOMS   A "pins and needles" feeling in your fingers or hand, especially in your thumb, index and middle fingers.  Tingling or numbness in your fingers or hand.  An aching feeling in your entire arm, especially when your wrist and elbow are bent for long periods of time.  Wrist pain that goes up your arm to your shoulder.  Pain that goes down into your palm or fingers.  A weak feeling in your hands. DIAGNOSIS  Your health care provider will take your history and perform a physical exam. An electromyography test may be needed. This test measures electrical signals sent out by your nerves into the muscles. The electrical signals are usually slowed by carpal tunnel syndrome. You may also need X-rays. TREATMENT  Carpal tunnel syndrome may clear up by itself. Your health care provider may recommend a wrist splint or medicine such as a nonsteroidal anti-inflammatory medicine. Cortisone injections may help. Sometimes, surgery may be needed to free the pinched nerve.  HOME CARE INSTRUCTIONS   Take all medicine as directed by your health care provider. Only take over-the-counter or prescription medicines for pain, discomfort, or fever as directed by your health care provider.  If you were given a splint to keep your wrist from bending, wear it as directed. It is important to wear the splint at  night. Wear the splint for as long as you have pain or numbness in your hand, arm, or wrist. This may take 1 to 2 months.  Rest your wrist from any activity that may be causing your pain. If your symptoms are work-related, you may need to talk to your employer about changing to a job that does not require using your wrist.  Put ice on your wrist after long periods of wrist activity.  Put ice in a plastic bag.  Place a towel between your skin and the bag.  Leave the ice on for 15-20 minutes, 03-04 times a day.  Keep all follow-up visits as directed by your health care provider. This includes any orthopedic referrals, physical therapy, and rehabilitation. Any delay in getting necessary care could result in a delay or failure of your condition to heal. SEEK IMMEDIATE MEDICAL CARE IF:   You have new, unexplained symptoms.  Your symptoms get worse and are not helped or controlled with medicines. MAKE SURE YOU:   Understand these instructions.  Will watch your condition.  Will get help right away if you are not doing well or get worse. Document Released: 02/09/2000 Document Revised: 06/28/2013 Document Reviewed: 12/28/2010 ExitCare Patient Information 2015 [employer]. This information is not intended to replace advice given to you by your health care provider. Make sure you discuss any questions you have with your health care provider.  

## 2014-08-16 NOTE — Telephone Encounter (Signed)
Called [facility] to get D/L from CPAP  Spoke with Ailene Ravel - she is going to contact the patient to have her bring in machine to get a current setting D/L off machine.  This will be faxed to main fax # [phone] Will hold in my box to follow up on

## 2014-08-18 NOTE — Telephone Encounter (Signed)
Ashtyn, did you ever receive download? thanks

## 2014-08-18 NOTE — Telephone Encounter (Signed)
PATIENT IN LOBBY to discuss CPAP machine.

## 2014-08-18 NOTE — Telephone Encounter (Signed)
Spoke with pt. Pt stated he took his card to [facility] to get the download. Advised pt once we receive the download from [facility] and VS reviews it then we will call him with recs. Pt verbalized understanding.

## 2014-08-22 ENCOUNTER — Other Ambulatory Visit: Payer: Self-pay

## 2014-08-22 NOTE — Telephone Encounter (Signed)
I have no received a D/L yet.  Called [facility] to have this faxed  Placed on a very long hold. Will call back later

## 2014-08-23 NOTE — Telephone Encounter (Signed)
Per message from [facility]:  [place], [place]           When we downloaded this pt's card, there was no information available. Here are the notes from the respiratory tech:   Conflicting data on SD CARD - encore anywhere would not save the data because 4 of the data periods where corrupt/conflicting/multiple units    We called the pt back to have him bring in his CPAP to try to get a d/l.  As of today, that has not happened.   Could you call him to advise him as well?   Thanks   ---  Called pt and LMTCB x1

## 2014-08-23 NOTE — Telephone Encounter (Signed)
I have sent [name] a staff message to have this faxed over. Will forward to [employer]

## 2014-08-24 NOTE — Telephone Encounter (Signed)
lmtcb for pt.  

## 2014-08-24 NOTE — Telephone Encounter (Signed)
Pt returning call - cell # [phone]

## 2014-08-24 NOTE — Telephone Encounter (Signed)
lmomtcb x1 for pt 

## 2014-08-25 NOTE — Telephone Encounter (Signed)
lmtcb for pt.  

## 2014-08-26 NOTE — Telephone Encounter (Signed)
Pt returned call, please leave a message on machine. Pt will call back when available  [phone]

## 2014-08-26 NOTE — Telephone Encounter (Signed)
Spoke with pt. States that he will take his CPAP unit back to [facility] to try to be downloaded. Will route message to Ashtyn follow up on download

## 2014-08-31 ENCOUNTER — Ambulatory Visit: Payer: Self-pay | Attending: Internal Medicine | Admitting: Internal Medicine

## 2014-08-31 ENCOUNTER — Encounter: Payer: Self-pay | Admitting: Internal Medicine

## 2014-08-31 VITALS — BP 122/80 | HR 66 | Temp 97.4°F | Resp 18 | Ht 68.0 in | Wt 246.2 lb

## 2014-08-31 DIAGNOSIS — K219 Gastro-esophageal reflux disease without esophagitis: Secondary | ICD-10-CM | POA: Insufficient documentation

## 2014-08-31 DIAGNOSIS — F17200 Nicotine dependence, unspecified, uncomplicated: Secondary | ICD-10-CM

## 2014-08-31 DIAGNOSIS — G5602 Carpal tunnel syndrome, left upper limb: Secondary | ICD-10-CM

## 2014-08-31 DIAGNOSIS — Z0000 Encounter for general adult medical examination without abnormal findings: Secondary | ICD-10-CM | POA: Insufficient documentation

## 2014-08-31 DIAGNOSIS — Z720 Tobacco use: Secondary | ICD-10-CM

## 2014-08-31 DIAGNOSIS — Z7982 Long term (current) use of aspirin: Secondary | ICD-10-CM | POA: Insufficient documentation

## 2014-08-31 DIAGNOSIS — F17210 Nicotine dependence, cigarettes, uncomplicated: Secondary | ICD-10-CM | POA: Insufficient documentation

## 2014-08-31 DIAGNOSIS — G4733 Obstructive sleep apnea (adult) (pediatric): Secondary | ICD-10-CM | POA: Insufficient documentation

## 2014-08-31 DIAGNOSIS — Z79899 Other long term (current) drug therapy: Secondary | ICD-10-CM | POA: Insufficient documentation

## 2014-08-31 DIAGNOSIS — G5600 Carpal tunnel syndrome, unspecified upper limb: Secondary | ICD-10-CM | POA: Insufficient documentation

## 2014-08-31 DIAGNOSIS — E559 Vitamin D deficiency, unspecified: Secondary | ICD-10-CM | POA: Insufficient documentation

## 2014-08-31 DIAGNOSIS — Z7952 Long term (current) use of systemic steroids: Secondary | ICD-10-CM | POA: Insufficient documentation

## 2014-08-31 DIAGNOSIS — I10 Essential (primary) hypertension: Secondary | ICD-10-CM | POA: Insufficient documentation

## 2014-08-31 LAB — COMPLETE METABOLIC PANEL WITH GFR
ALBUMIN: 4.4 g/dL (ref 3.5–5.2)
ALT: 21 U/L (ref 0–53)
AST: 14 U/L (ref 0–37)
Alkaline Phosphatase: 66 U/L (ref 39–117)
BUN: 13 mg/dL (ref 6–23)
CO2: 22 mEq/L (ref 19–32)
Calcium: 9.5 mg/dL (ref 8.4–10.5)
Chloride: 105 mEq/L (ref 96–112)
Creat: 1.06 mg/dL (ref 0.50–1.35)
GFR, EST NON AFRICAN AMERICAN: 80 mL/min
GFR, Est African American: >89 mL/min
GLUCOSE: 101 mg/dL — AB (ref 70–99)
POTASSIUM: 4 meq/L (ref 3.5–5.3)
Sodium: 141 mEq/L (ref 135–145)
TOTAL PROTEIN: 7.5 g/dL (ref 6.0–8.3)
Total Bilirubin: 0.4 mg/dL (ref 0.2–1.2)

## 2014-08-31 MED ORDER — OMEPRAZOLE 20 MG PO CPDR: 20.0000 mg | DELAYED_RELEASE_CAPSULE | Freq: Every day | ORAL | Status: DC

## 2014-08-31 NOTE — Patient Instructions (Signed)

## 2014-08-31 NOTE — Progress Notes (Signed)
Patient here for physical.  Patient feels "alright" today and has no complaints of pain at this time.  Patient reports numbness on tops of fingers from carpal tunnel syndrome.  Patient also reports blood in stool, "more than usual," but states he has colonoscopy scheduled.

## 2014-08-31 NOTE — Progress Notes (Signed)
Patient Demographics  Samuel Irwin, is a 54 y.o. male  EYC:[id number]  JSH:[id number]  DOB - 08-Jun-1960  CC:  Chief Complaint  Patient presents with  . Annual Exam       HPI: Samuel Irwin is a 54 y.o. male here today for annual physical examination., Patient has history of hypertension, hyperlipidemia, has been compliant in taking his medications denies any acute symptoms, does complains of occasional bleeding per rectum denies constipation, already has scheduled appointment with GI for screening colonoscopy, denies any nausea vomiting change in bowel habits,he is complaining of reflux symptoms and has tried over-the-counter tums.patient also history of carpal tunnel syndrome and has occasional numbness tingling in his left hand, he reported prior surgery in the right hand. Patient has not been using the wrist brace at night which he is advised to do. Patient has No headache, No chest pain, No abdominal pain - No Nausea, No new weakness tingling or numbness, No Cough - SOB.  No Known Allergies Past Medical History  Diagnosis Date  . GERD (gastroesophageal reflux disease)   . Umbilical hernia   . Constipation   . Cough   . Wheezing   . Weakness   . Generalized headaches   . Heart murmur   . H/O hiatal hernia   . Herpes   . Hypertension    Current Outpatient Prescriptions on File Prior to Visit  Medication Sig Dispense Refill  . acyclovir (ZOVIRAX) 400 MG tablet Take 1 tablet (400 mg total) by mouth 2 (two) times daily. 60 tablet 2  . aspirin EC 81 MG EC tablet Take 1 tablet (81 mg total) by mouth daily.    . hydrochlorothiazide (HYDRODIURIL) 25 MG tablet TAKE 1 TABLET BY MOUTH DAILY. 90 tablet 3  . tadalafil (CIALIS) 20 MG tablet Take 1 tablet (20 mg total) by mouth daily as needed for erectile dysfunction. 30 tablet 2  . traMADol (ULTRAM) 50 MG tablet Take 1 tablet (50 mg total) by mouth every 6 (six) hours as needed for severe pain. 40 tablet 0  . ergocalciferol  (VITAMIN D2) 50000 UNITS capsule Take 1 capsule (50,000 Units total) by mouth once a week. (Patient not taking: Reported on 08/31/2014) 12 capsule 2  . levofloxacin (LEVAQUIN) 750 MG tablet Take 1 tablet (750 mg total) by mouth daily. (Patient not taking: Reported on 05/10/2014) 3 tablet 0  . predniSONE (DELTASONE) 10 MG tablet Take 1 tablet (10 mg total) by mouth daily with breakfast. (Patient not taking: Reported on 08/31/2014) 5 tablet 0  . Vitamin D, Ergocalciferol, (DRISDOL) 50000 UNITS CAPS capsule Take 1 capsule (50,000 Units total) by mouth every 7 (seven) days. (Patient not taking: Reported on 08/31/2014) 12 capsule 0   No current facility-administered medications on file prior to visit.   Family History  Problem Relation Age of Onset  . Cancer Father     lung   History   Social History  . Marital Status: Single    Spouse Name: N/A  . Number of Children: N/A  . Years of Education: N/A   Occupational History  . unemployed    Social History Main Topics  . Smoking status: Current Some Day Smoker -- 0.25 packs/day for 15 years    Types: Cigarettes  . Smokeless tobacco: Not on file     Comment: occas still smokes when he drinks  . Alcohol Use: 0.0 oz/week    0 Standard drinks or equivalent per week     Comment: social drinks,  about one 6-pack per week (08/31/14)  . Drug Use: No  . Sexual Activity: Not on file     Comment: "when I drink"   Other Topics Concern  . Not on file   Social History Narrative    Review of Systems: Constitutional: Negative for fever, chills, diaphoresis, activity change, appetite change and fatigue. HENT: Negative for ear pain, nosebleeds, congestion, facial swelling, rhinorrhea, neck pain, neck stiffness and ear discharge.  Eyes: Negative for pain, discharge, redness, itching and visual disturbance. Respiratory: Negative for cough, choking, chest tightness, shortness of breath, wheezing and stridor.  Cardiovascular: Negative for chest pain,  palpitations and leg swelling. Gastrointestinal: Negative for abdominal distention. Genitourinary: Negative for dysuria, urgency, frequency, hematuria, flank pain, decreased urine volume, difficulty urinating and dyspareunia.  Musculoskeletal: Negative for back pain, joint swelling, arthralgia and gait problem. Neurological: Negative for dizziness, tremors, seizures, syncope, facial asymmetry, speech difficulty, weakness, light-headedness, numbness and headaches.  Hematological: Negative for adenopathy. Does not bruise/bleed easily. Psychiatric/Behavioral: Negative for hallucinations, behavioral problems, confusion, dysphoric mood, decreased concentration and agitation.    Objective:   Filed Vitals:   08/31/14 1519  BP: 122/80  Pulse: 66  Temp: 97.4 F (36.3 C)  Resp: 18    Physical Exam: Constitutional: Patient appears well-developed and well-nourished. No distress. HENT: Normocephalic, atraumatic, External right and left ear normal. Oropharynx is clear and moist.  Eyes: Conjunctivae and EOM are normal. PERRLA, no scleral icterus. Neck: Normal ROM. Neck supple. No JVD. No tracheal deviation. No thyromegaly. CVS: RRR, S1/S2 +, no murmurs, no gallops, no carotid bruit.  Pulmonary: Effort and breath sounds normal, no stridor, rhonchi, wheezes, rales.  Abdominal: Soft. BS +, no distension, tenderness, rebound or guarding.  Musculoskeletal: Normal range of motion. No edema and no tenderness.  Neuro: Alert. Normal reflexes, muscle tone coordination. No cranial nerve deficit. Skin: Skin is warm and dry. No rash noted. Not diaphoretic. No erythema. No pallor. Psychiatric: Normal mood and affect. Behavior, judgment, thought content normal.  Lab Results  Component Value Date   WBC 14.8* 05/02/2014   HGB 14.0 05/02/2014   HCT 41.2 05/02/2014   MCV 90.2 05/02/2014   PLT 253 05/02/2014   Lab Results  Component Value Date   CREATININE 1.19 05/10/2014   BUN 14 05/10/2014   NA 139  05/10/2014   K 4.5 05/10/2014   CL 102 05/10/2014   CO2 26 05/10/2014    Lab Results  Component Value Date/Time   HGBA1C 5.3 05/18/2013 05:54 PM   HGBA1C 5.5 03/20/2012 06:06 PM   Lipid Panel     Component Value Date/Time   CHOL 185 03/09/2014 1241   TRIG 182* 03/09/2014 1241   HDL 44 03/09/2014 1241   CHOLHDL 4.2 03/09/2014 1241   VLDL 36 03/09/2014 1241   LDLCALC 105* 03/09/2014 1241       Assessment and plan:   1. Annual physical exam  - COMPLETE METABOLIC PANEL WITH GFR  2. Carpal tunnel syndrome of left wrist Continue with wrist brace.  3. Smoking Advise patient to quit smoking  4. Morbid obesity Diet and exercise.  5. OSA (obstructive sleep apnea) Currently on CPAP.  6. Vitamin D deficiency  - Vit D  25 hydroxy (rtn osteoporosis monitoring)  7. Essential hypertension Blood pressure is well controlled continue with current meds, repeat blood chemistry. - COMPLETE METABOLIC PANEL WITH GFR  8. Gastroesophageal reflux disease, esophagitis presence not specified Life Style modification, trial of PPI  - omeprazole (PRILOSEC) 20 MG capsule; Take  1 capsule (20 mg total) by mouth daily.  Dispense: 30 capsule; Refill: 3      Health Maintenance -Colonoscopy:patient is scheduled for colonoscopy  Return in about 3 months (around 12/01/2014), or if symptoms worsen or fail to improve, for hypertension.    The patient was given clear instructions to go to ER or return to medical center if symptoms don't improve, worsen or new problems develop. The patient verbalized understanding. The patient was told to call to get lab results if they haven't heard anything in the next week.    This note has been created with Surveyor, quantity. Any transcriptional errors are unintentional.   Lorayne Marek, MD

## 2014-09-01 ENCOUNTER — Telehealth: Payer: Self-pay

## 2014-09-01 LAB — VITAMIN D 25 HYDROXY (VIT D DEFICIENCY, FRACTURES): Vit D, 25-Hydroxy: 21 ng/mL — ABNORMAL LOW (ref 30–100)

## 2014-09-01 MED ORDER — VITAMIN D (ERGOCALCIFEROL) 1.25 MG (50000 UNIT) PO CAPS: 50000.0000 [IU] | ORAL_CAPSULE | ORAL | Status: DC

## 2014-09-01 NOTE — Telephone Encounter (Signed)
Pt has been scheduled with VS on 09/05/14 at 3pm. Nothing further was needed.

## 2014-09-01 NOTE — Telephone Encounter (Signed)
He needs to have an ROV with me to determine what options are available for treating his sleep apnea.

## 2014-09-01 NOTE — Telephone Encounter (Signed)
Patient not available Left message on voice mail to return our call [facility]

## 2014-09-01 NOTE — Telephone Encounter (Signed)
Received fax from [facility] stating pt took machine in for download on 08/26/14 but no data on the card. The patient advised [facility] he has not been using his BIPAP machine because it chokes him in his sleep. They offered pt to private pay for auto bipap titration study for $220 but patient refused again stating he could not afford it. Patient received  BIPAPST set in standard BIPAP mode from [facility] 03/04/14. Fax given to [employer] as well. Will forward to Dr. Halford Chessman. Please advise thanks

## 2014-09-01 NOTE — Telephone Encounter (Signed)
-----   Message from Lorayne Marek, MD sent at 09/01/2014 12:50 PM EDT ----- Blood work reviewed, noticed low vitamin D, call patient advise to start ergocalciferol 50,000 units once a week for the duration of  12 weeks, then take OTC vitamin d 2000 units daily.

## 2014-09-05 ENCOUNTER — Encounter: Payer: Self-pay | Admitting: Pulmonary Disease

## 2014-09-05 ENCOUNTER — Ambulatory Visit (INDEPENDENT_AMBULATORY_CARE_PROVIDER_SITE_OTHER): Payer: Self-pay | Admitting: Pulmonary Disease

## 2014-09-05 ENCOUNTER — Ambulatory Visit: Payer: Self-pay | Admitting: Nurse Practitioner

## 2014-09-05 VITALS — BP 136/88 | HR 77 | Ht 68.0 in | Wt 254.0 lb

## 2014-09-05 DIAGNOSIS — G4733 Obstructive sleep apnea (adult) (pediatric): Secondary | ICD-10-CM

## 2014-09-05 NOTE — Progress Notes (Signed)
Chief Complaint  Patient presents with  . Follow-up    Discuss Bipap treatment. Unable to use d/t feeling of suffocating and takes mask off at night.     History of Present Illness: Samuel Irwin is a 54 y.o. male with OSA.  He was previously seen by Dr. Gwenette Greet.  He was originally tried on CPAP, but then had titration study and was changed to BiPAP.  He has not been able to tolerate PAP therapy.  He still feels like his breathing cuts off while he is asleep.  As a result he has persistent daytime sleepiness.  This has made it difficulty for him to keep a job.  He was down to 227 lbs previously, and his sleep was much better.  He has since moved in with his mother after he lost his job >> he says his mother has food all over the house, and he can't control himself to not eat the food.  He has been trying to workout with cardiovascular exercises.   He is trying to apply for disability based on his sleep apnea and persistent daytime sleepiness.  He has asked I call his mother to discuss his current respiratory conditions >> home [phone] , cell [phone].   TESTS: PSG 08/27/12 >> AHI 87  Past medical hx >> GERD, HH, HTN  Past surgical hx, Medications, Allergies, Family hx, Social hx all reviewed.   Physical Exam: BP 136/88 mmHg  Pulse 77  Ht 5\' 8"  (1.727 m)  Wt 254 lb (115.214 kg)  BMI 38.63 kg/m2  SpO2 94%  General - No distress ENT - No sinus tenderness, no oral exudate, no LAN, MP 4, enlarged tongue Cardiac - s1s2 regular, no murmur Chest - No wheeze/rales/dullness Back - No focal tenderness Abd - Soft, non-tender Ext - No edema Neuro - Normal strength Skin - No rashes Psych - normal mood, and behavior  CMP Latest Ref Rng 08/31/2014 05/10/2014 05/02/2014  Glucose 70 - 99 mg/dL 101(H) 90 141(H)  BUN 6 - 23 mg/dL 13 14 7   Creatinine 0.50 - 1.35 mg/dL 1.06 1.19 1.13  Sodium 135 - 145 mEq/L 141 139 135  Potassium 3.5 - 5.3 mEq/L 4.0 4.5 3.5  Chloride 96 - 112 mEq/L  105 102 104  CO2 19 - 32 mEq/L 22 26 27   Calcium 8.4 - 10.5 mg/dL 9.5 9.9 8.7  Total Protein 6.0 - 8.3 g/dL 7.5 7.5 -  Total Bilirubin 0.2 - 1.2 mg/dL 0.4 0.5 -  Alkaline Phos 39 - 117 U/L 66 70 -  AST 0 - 37 U/L 14 16 -  ALT 0 - 53 U/L 21 28 -    Assessment/Plan:  Obstructive sleep apnea. He has difficulty tolerating to PAP therapy. Plan: - will change his BiPAP to 20/16 cm H2O and get download after pressure change - explained to him importance of maintaining compliance with PAP therapy - if he is unable to tolerate PAP, then options are oral appliance and/or surgical assessment >> best option is to stick with PAP therapy  Obesity. Plan: - discussed options to assist with weight loss - encouraged him to continue his exercise regimen - emphasized importance of monitoring his diet  Persistent hypersomnia. Plan: - I have written a letter for his disability application    Chesley Mires, MD [facility] Pager:  [phone]

## 2014-09-05 NOTE — Patient Instructions (Signed)
Will change BiPAP to 20/16 cm H2O Will call when your letter is written Follow up in 2 months

## 2014-09-15 ENCOUNTER — Telehealth: Payer: Self-pay | Admitting: Pulmonary Disease

## 2014-09-15 NOTE — Telephone Encounter (Signed)
lmtcb x1 

## 2014-09-16 NOTE — Telephone Encounter (Signed)
Pt returning call and can be reached [phone].Samuel Irwin

## 2014-09-16 NOTE — Telephone Encounter (Signed)
Order was entered on 7/11 for patient to have changes to his BiPAP pressure settings.   Patient has not heard anything from [facility]. Advised patient that I would send them a message and have them contact him directly. Message sent to [facility] requesting that patient receive call from them on his cell phone today, phone number given to [facility] to contact patient. Nothing further needed.

## 2014-09-16 NOTE — Telephone Encounter (Signed)
lmomtcb x 2  

## 2014-09-21 ENCOUNTER — Encounter: Payer: Self-pay | Admitting: Gastroenterology

## 2014-09-21 ENCOUNTER — Other Ambulatory Visit: Payer: Self-pay

## 2014-09-21 ENCOUNTER — Ambulatory Visit (INDEPENDENT_AMBULATORY_CARE_PROVIDER_SITE_OTHER): Payer: Self-pay | Admitting: Gastroenterology

## 2014-09-21 VITALS — BP 133/79 | HR 83 | Temp 97.8°F | Ht 68.0 in | Wt 248.2 lb

## 2014-09-21 DIAGNOSIS — R112 Nausea with vomiting, unspecified: Secondary | ICD-10-CM | POA: Insufficient documentation

## 2014-09-21 DIAGNOSIS — K219 Gastro-esophageal reflux disease without esophagitis: Secondary | ICD-10-CM | POA: Insufficient documentation

## 2014-09-21 DIAGNOSIS — K625 Hemorrhage of anus and rectum: Secondary | ICD-10-CM | POA: Insufficient documentation

## 2014-09-21 NOTE — Assessment & Plan Note (Signed)
Several year history of intermittent reflux, worsening lately. Recently started PPI although takes when necessary only. Frequent episodes of vomiting postprandially although patient describes "overeating". Complains of poor dentition from repetitive vomiting. Discussed antireflux measures at length. Suspect symptoms related to GERD, poor dietary habits however cannot exclude peptic ulcer disease/gastritis given NSAID use. Plan on EGD for further evaluation of symptoms. EGD in the OR with deep sedation due to alcohol use.  I have discussed the risks, alternatives, benefits with regards to but not limited to the risk of reaction to medication, bleeding, infection, perforation and the patient is agreeable to proceed. Written consent to be obtained.

## 2014-09-21 NOTE — Patient Instructions (Signed)
1. Upper endoscopy and colonoscopy with Dr. Oneida Alar. See separate instructions.  2. Please cut back on your alcohol consumption. You should consume no more than 24-36 ounces of beer per day, preferably much less.

## 2014-09-21 NOTE — Assessment & Plan Note (Signed)
Intermittent rectal bleeding, possibly benign anorectal source. Diagnostic colonoscopy recommended. Deep sedation given significant alcohol use.  I have discussed the risks, alternatives, benefits with regards to but not limited to the risk of reaction to medication, bleeding, infection, perforation and the patient is agreeable to proceed. Written consent to be obtained.  Cannot exclude possibility of hemorrhoids. Patient may be interested in [facility] banding if he is felt to be a candidate.

## 2014-09-21 NOTE — Progress Notes (Signed)
Primary Care Physician:  Lorayne Marek, MD  Primary Gastroenterologist:  Barney Drain, MD   Chief Complaint  Patient presents with  . Rectal Bleeding    HPI:  Samuel Irwin is a 54 y.o. male here to schedule first ever colonoscopy. He has h/o rectal bleeding, noted on stool and tissue. Complains of incomplete evacuation. Thinks he may have a hemorrhoid. Sometimes some itching but no rectal pain. Sometimes will take a laxative to help. Eating lot of fruits/veggies. Drinks a lot of Kool-aid and juice. Takes Aleve sometimes several times per week. No melena. Heartburn for couple of years. Recently started omeprazole, doesn't take it every day. Drinks beer about three times per week, 80-120 ounces at a time. No dysphagia. Good appetite. Complains of frequent vomiting but feels like he overeats. Complains of poor dentition related to repetitive vomiting.   Current Outpatient Prescriptions  Medication Sig Dispense Refill  . acyclovir (ZOVIRAX) 400 MG tablet Take 1 tablet (400 mg total) by mouth 2 (two) times daily. 60 tablet 2  . hydrochlorothiazide (HYDRODIURIL) 25 MG tablet TAKE 1 TABLET BY MOUTH DAILY. 90 tablet 3  . Multiple Vitamin (MULTIVITAMIN) capsule Take 1 capsule by mouth daily.    Marland Kitchen omeprazole (PRILOSEC) 20 MG capsule Take 1 capsule (20 mg total) by mouth daily. 30 capsule 3  . tadalafil (CIALIS) 20 MG tablet Take 1 tablet (20 mg total) by mouth daily as needed for erectile dysfunction. 30 tablet 2  . Vitamin D, Ergocalciferol, (DRISDOL) 50000 UNITS CAPS capsule Take 1 capsule (50,000 Units total) by mouth every 7 (seven) days. 12 capsule 0   No current facility-administered medications for this visit.    Allergies as of 09/21/2014  . (No Known Allergies)    Past Medical History  Diagnosis Date  . GERD (gastroesophageal reflux disease)   . Umbilical hernia   . Constipation   . Cough   . Wheezing   . Weakness   . Generalized headaches   . Heart murmur   . H/O hiatal  hernia   . Herpes   . Hypertension     Past Surgical History  Procedure Laterality Date  . Femur surgery      left  . Toe surgery      dislocated lt foot  . Carpal tunnel release      R hand  . Umbilical hernia repair N/A 04/07/2012    Procedure: HERNIA REPAIR UMBILICAL ADULT;  Surgeon: Madilyn Hook, DO;  Location: [facility];  Service: General;  Laterality: N/A;  open umbilical hernia repair with mesh  . Insertion of mesh N/A 04/07/2012    Procedure: INSERTION OF MESH;  Surgeon: Madilyn Hook, DO;  Location: [facility];  Service: General;  Laterality: N/A;  . Hernia repair  04/07/12    umb hernia repair  . Knee arthroscopy      right    Family History  Problem Relation Age of Onset  . Cancer Father     lung  . Colon cancer Neg Hx   . Hypertension Mother     History   Social History  . Marital Status: Single    Spouse Name: N/A  . Number of Children: N/A  . Years of Education: N/A   Occupational History  . unemployed    Social History Main Topics  . Smoking status: Current Some Day Smoker -- 0.25 packs/day for 15 years    Types: Cigarettes  . Smokeless tobacco: Not on file     Comment: occas still  smokes when he drinks  . Alcohol Use: 0.0 oz/week    0 Standard drinks or equivalent per week     Comment: social drinks, drinks 80-120 ounces of beer three times weekly (09/21/14)  . Drug Use: No  . Sexual Activity: Not on file     Comment: "when I drink"   Other Topics Concern  . Not on file   Social History Narrative      ROS:  General: Negative for anorexia, weight loss, fever, chills, fatigue, weakness. Eyes: Negative for vision changes.  ENT: Negative for hoarseness, difficulty swallowing , nasal congestion. CV: Negative for chest pain, angina, palpitations, dyspnea on exertion, peripheral edema.  Respiratory: Negative for dyspnea at rest, dyspnea on exertion, cough, sputum, wheezing.  GI: See history of present illness. GU:  Negative for dysuria, hematuria,  urinary incontinence, urinary frequency, nocturnal urination.  MS: Negative for joint pain, low back pain.  Derm: Negative for rash or itching.  Neuro: Negative for weakness, abnormal sensation, seizure, frequent headaches, memory loss, confusion.  Psych: Negative for anxiety, depression, suicidal ideation, hallucinations.  Endo: Negative for unusual weight change.  Heme: Negative for bruising or bleeding. Allergy: Negative for rash or hives.    Physical Examination:  BP 133/79 mmHg  Pulse 83  Temp(Src) 97.8 F (36.6 C) (Oral)  Ht 5\' 8"  (1.727 m)  Wt 248 lb 3.2 oz (112.583 kg)  BMI 37.75 kg/m2   General: Well-nourished, well-developed in no acute distress.  Head: Normocephalic, atraumatic.   Eyes: Conjunctiva pink, no icterus. Mouth: Oropharyngeal mucosa moist and pink , no lesions erythema or exudate. Neck: Supple without thyromegaly, masses, or lymphadenopathy.  Lungs: Clear to auscultation bilaterally.  Heart: Regular rate and rhythm, no murmurs rubs or gallops.  Abdomen: Bowel sounds are normal, nontender, nondistended, no hepatosplenomegaly or masses, no abdominal bruits or    hernia , no rebound or guarding.   Rectal: Deferred Extremities: No lower extremity edema. No clubbing or deformities.  Neuro: Alert and oriented x 4 , grossly normal neurologically.  Skin: Warm and dry, no rash or jaundice.   Psych: Alert and cooperative, normal mood and affect.  Labs: Lab Results  Component Value Date   WBC 14.8* 05/02/2014   HGB 14.0 05/02/2014   HCT 41.2 05/02/2014   MCV 90.2 05/02/2014   PLT 253 05/02/2014   Lab Results  Component Value Date   CREATININE 1.06 08/31/2014   BUN 13 08/31/2014   NA 141 08/31/2014   K 4.0 08/31/2014   CL 105 08/31/2014   CO2 22 08/31/2014   Lab Results  Component Value Date   ALT 21 08/31/2014   AST 14 08/31/2014   ALKPHOS 66 08/31/2014   BILITOT 0.4 08/31/2014     Imaging Studies: No results found.

## 2014-09-23 ENCOUNTER — Telehealth: Payer: Self-pay | Admitting: Internal Medicine

## 2014-09-23 ENCOUNTER — Telehealth: Payer: Self-pay

## 2014-09-23 NOTE — Telephone Encounter (Signed)
Pt. Came in asking for lab results....please call patient

## 2014-09-23 NOTE — Telephone Encounter (Signed)
Returned patient phone call and he is aware of his lab results

## 2014-09-26 NOTE — Progress Notes (Signed)
CC'ED TO PCP 

## 2014-10-13 NOTE — Patient Instructions (Signed)
Samuel Irwin  10/13/2014     @PREFPERIOPPHARMACY @   Your procedure is scheduled on 10/18/2014.  Report to [facility] at 10:45 A.M.  Call this number if you have problems the morning of surgery:  [phone]   Remember:  Do not eat food or drink liquids after midnight.  Take these medicines the morning of surgery with A SIP OF WATER Prilosec   Do not wear jewelry, make-up or nail polish.  Do not wear lotions, powders, or perfumes.  You may wear deodorant.  Do not shave 48 hours prior to surgery.  Men may shave face and neck.  Do not bring valuables to the hospital.  [facility] is not responsible for any belongings or valuables.  Contacts, dentures or bridgework may not be worn into surgery.  Leave your suitcase in the car.  After surgery it may be brought to your room.  For patients admitted to the hospital, discharge time will be determined by your treatment team.  Patients discharged the day of surgery will not be allowed to drive home.    Please read over the following fact sheets that you were given. Anesthesia Post-op Instructions     PATIENT INSTRUCTIONS POST-ANESTHESIA  IMMEDIATELY FOLLOWING SURGERY:  Do not drive or operate machinery for the first twenty four hours after surgery.  Do not make any important decisions for twenty four hours after surgery or while taking narcotic pain medications or sedatives.  If you develop intractable nausea and vomiting or a severe headache please notify your doctor immediately.  FOLLOW-UP:  Please make an appointment with your surgeon as instructed. You do not need to follow up with anesthesia unless specifically instructed to do so.  WOUND CARE INSTRUCTIONS (if applicable):  Keep a dry clean dressing on the anesthesia/puncture wound site if there is drainage.  Once the wound has quit draining you may leave it open to air.  Generally you should leave the bandage intact for twenty four hours unless there is drainage.  If the  epidural site drains for more than 36-48 hours please call the anesthesia department.  QUESTIONS?:  Please feel free to call your physician or the hospital operator if you have any questions, and they will be happy to assist you.      Esophagogastroduodenoscopy Esophagogastroduodenoscopy (EGD) is a procedure to examine the lining of the esophagus, stomach, and first part of the small intestine (duodenum). A long, flexible, lighted tube with a camera attached (endoscope) is inserted down the throat to view these organs. This procedure is done to detect problems or abnormalities, such as inflammation, bleeding, ulcers, or growths, in order to treat them. The procedure lasts about 5-20 minutes. It is usually an outpatient procedure, but it may need to be performed in emergency cases in the hospital. LET YOUR CAREGIVER KNOW ABOUT:   Allergies to food or medicine.  All medicines you are taking, including vitamins, herbs, eyedrops, and over-the-counter medicines and creams.  Use of steroids (by mouth or creams).  Previous problems you or members of your family have had with the use of anesthetics.  Any blood disorders you have.  Previous surgeries you have had.  Other health problems you have.  Possibility of pregnancy, if this applies. RISKS AND COMPLICATIONS  Generally, EGD is a safe procedure. However, as with any procedure, complications can occur. Possible complications include:  Infection.  Bleeding.  Tearing (perforation) of the esophagus, stomach, or duodenum.  Difficulty breathing or not being able to breath.  Excessive sweating.  Spasms of the larynx.  Slowed heartbeat.  Low blood pressure. BEFORE THE PROCEDURE  Do not eat or drink anything for 6-8 hours before the procedure or as directed by your caregiver.  Ask your caregiver about changing or stopping your regular medicines.  If you wear dentures, be prepared to remove them before the procedure.  Arrange for  someone to drive you home after the procedure. PROCEDURE   A vein will be accessed to give medicines and fluids. A medicine to relax you (sedative) and a pain reliever will be given through that access into the vein.  A numbing medicine (local anesthetic) may be sprayed on your throat for comfort and to stop you from gagging or coughing.  A mouth guard may be placed in your mouth to protect your teeth and to keep you from biting on the endoscope.  You will be asked to lie on your left side.  The endoscope is inserted down your throat and into the esophagus, stomach, and duodenum.  Air is put through the endoscope to allow your caregiver to view the lining of your esophagus clearly.  The esophagus, stomach, and duodenum is then examined. During the exam, your caregiver may:  Remove tissue to be examined under a microscope (biopsy) for inflammation, infection, or other medical problems.  Remove growths.  Remove objects (foreign bodies) that are stuck.  Treat any bleeding with medicines or other devices that stop tissues from bleeding (hot cautery, clipping devices).  Widen (dilate) or stretch narrowed areas of the esophagus and stomach.  The endoscope will then be withdrawn. AFTER THE PROCEDURE  You will be taken to a recovery area to be monitored. You will be able to go home once you are stable and alert.  Do not eat or drink anything until the local anesthetic and numbing medicines have worn off. You may choke.  It is normal to feel bloated, have pain with swallowing, or have a sore throat for a short time. This will wear off.  Your caregiver should be able to discuss his or her findings with you. It will take longer to discuss the test results if any biopsies were taken. Document Released: 06/14/2004 Document Revised: 06/28/2013 Document Reviewed: 01/15/2012 [facility] Patient Information 2015 [place], [place]. This information is not intended to replace advice given to you by  your health care provider. Make sure you discuss any questions you have with your health care provider. Colonoscopy A colonoscopy is an exam to look at the entire large intestine (colon). This exam can help find problems such as tumors, polyps, inflammation, and areas of bleeding. The exam takes about 1 hour.  LET [facility] CARE PROVIDER KNOW ABOUT:   Any allergies you have.  All medicines you are taking, including vitamins, herbs, eye drops, creams, and over-the-counter medicines.  Previous problems you or members of your family have had with the use of anesthetics.  Any blood disorders you have.  Previous surgeries you have had.  Medical conditions you have. RISKS AND COMPLICATIONS  Generally, this is a safe procedure. However, as with any procedure, complications can occur. Possible complications include:  Bleeding.  Tearing or rupture of the colon wall.  Reaction to medicines given during the exam.  Infection (rare). BEFORE THE PROCEDURE   Ask your health care provider about changing or stopping your regular medicines.  You may be prescribed an oral bowel prep. This involves drinking a large amount of medicated liquid, starting the day before your procedure. The  liquid will cause you to have multiple loose stools until your stool is almost clear or light green. This cleans out your colon in preparation for the procedure.  Do not eat or drink anything else once you have started the bowel prep, unless your health care provider tells you it is safe to do so.  Arrange for someone to drive you home after the procedure. PROCEDURE   You will be given medicine to help you relax (sedative).  You will lie on your side with your knees bent.  A long, flexible tube with a light and camera on the end (colonoscope) will be inserted through the rectum and into the colon. The camera sends video back to a computer screen as it moves through the colon. The colonoscope also releases carbon  dioxide gas to inflate the colon. This helps your health care provider see the area better.  During the exam, your health care provider may take a small tissue sample (biopsy) to be examined under a microscope if any abnormalities are found.  The exam is finished when the entire colon has been viewed. AFTER THE PROCEDURE   Do not drive for 24 hours after the exam.  You may have a small amount of blood in your stool.  You may pass moderate amounts of gas and have mild abdominal cramping or bloating. This is caused by the gas used to inflate your colon during the exam.  Ask when your test results will be ready and how you will get your results. Make sure you get your test results. Document Released: 02/09/2000 Document Revised: 12/02/2012 Document Reviewed: 10/19/2012 [facility] Patient Information 2015 [place], [place]. This information is not intended to replace advice given to you by your health care provider. Make sure you discuss any questions you have with your health care provider.

## 2014-10-14 ENCOUNTER — Encounter (HOSPITAL_COMMUNITY): Payer: Self-pay

## 2014-10-14 ENCOUNTER — Encounter (HOSPITAL_COMMUNITY)
Admission: RE | Admit: 2014-10-14 | Discharge: 2014-10-14 | Disposition: A | Discharge status: Home / Self Care | Payer: Self-pay | Source: Ambulatory Visit | Attending: Gastroenterology | Admitting: Gastroenterology

## 2014-10-14 DIAGNOSIS — R112 Nausea with vomiting, unspecified: Secondary | ICD-10-CM | POA: Insufficient documentation

## 2014-10-14 DIAGNOSIS — K625 Hemorrhage of anus and rectum: Secondary | ICD-10-CM | POA: Insufficient documentation

## 2014-10-14 DIAGNOSIS — K219 Gastro-esophageal reflux disease without esophagitis: Secondary | ICD-10-CM | POA: Insufficient documentation

## 2014-10-14 DIAGNOSIS — Z01818 Encounter for other preprocedural examination: Secondary | ICD-10-CM | POA: Insufficient documentation

## 2014-10-14 LAB — COMPREHENSIVE METABOLIC PANEL
ALBUMIN: 4 g/dL (ref 3.5–5.0)
ALK PHOS: 58 U/L (ref 38–126)
ALT: 20 U/L (ref 17–63)
ANION GAP: 7 (ref 5–15)
AST: 16 U/L (ref 15–41)
BILIRUBIN TOTAL: 0.8 mg/dL (ref 0.3–1.2)
BUN: 16 mg/dL (ref 6–20)
CALCIUM: 9.4 mg/dL (ref 8.9–10.3)
CO2: 27 mmol/L (ref 22–32)
CREATININE: 1.08 mg/dL (ref 0.61–1.24)
Chloride: 105 mmol/L (ref 101–111)
GFR calc Af Amer: >60 mL/min (ref >60)
GFR calc non Af Amer: >60 mL/min (ref >60)
GLUCOSE: 108 mg/dL — AB (ref 65–99)
Potassium: 4 mmol/L (ref 3.5–5.1)
SODIUM: 139 mmol/L (ref 135–145)
TOTAL PROTEIN: 7.2 g/dL (ref 6.5–8.1)

## 2014-10-14 LAB — CBC
HEMATOCRIT: 46.2 % (ref 39.0–52.0)
HEMOGLOBIN: 15.6 g/dL (ref 13.0–17.0)
MCH: 31.1 pg (ref 26.0–34.0)
MCHC: 33.8 g/dL (ref 30.0–36.0)
MCV: 92 fL (ref 78.0–100.0)
Platelets: 301 10*3/uL (ref 150–400)
RBC: 5.02 MIL/uL (ref 4.22–5.81)
RDW: 14.7 % (ref 11.5–15.5)
WBC: 6.7 10*3/uL (ref 4.0–10.5)

## 2014-10-14 NOTE — Progress Notes (Signed)
REVIEWED-NO ADDITIONAL RECOMMENDATIONS. Egd/tcs/possible hemorrhoid banding.

## 2014-10-18 ENCOUNTER — Ambulatory Visit (HOSPITAL_COMMUNITY)
Admission: RE | Admit: 2014-10-18 | Discharge: 2014-10-18 | Disposition: A | Discharge status: Home / Self Care | Payer: Self-pay | Source: Ambulatory Visit | Attending: Gastroenterology | Admitting: Gastroenterology

## 2014-10-18 ENCOUNTER — Encounter (HOSPITAL_COMMUNITY): Payer: Self-pay | Admitting: *Deleted

## 2014-10-18 ENCOUNTER — Ambulatory Visit (HOSPITAL_COMMUNITY): Payer: Self-pay | Admitting: Anesthesiology

## 2014-10-18 ENCOUNTER — Encounter (HOSPITAL_COMMUNITY)
Admission: RE | Disposition: A | Discharge status: Home / Self Care | Payer: Self-pay | Source: Ambulatory Visit | Attending: Gastroenterology

## 2014-10-18 DIAGNOSIS — B009 Herpesviral infection, unspecified: Secondary | ICD-10-CM | POA: Insufficient documentation

## 2014-10-18 DIAGNOSIS — K625 Hemorrhage of anus and rectum: Secondary | ICD-10-CM | POA: Insufficient documentation

## 2014-10-18 DIAGNOSIS — K649 Unspecified hemorrhoids: Secondary | ICD-10-CM

## 2014-10-18 DIAGNOSIS — K921 Melena: Secondary | ICD-10-CM | POA: Insufficient documentation

## 2014-10-18 DIAGNOSIS — R1013 Epigastric pain: Secondary | ICD-10-CM | POA: Insufficient documentation

## 2014-10-18 DIAGNOSIS — Q438 Other specified congenital malformations of intestine: Secondary | ICD-10-CM | POA: Insufficient documentation

## 2014-10-18 DIAGNOSIS — Z79899 Other long term (current) drug therapy: Secondary | ICD-10-CM | POA: Insufficient documentation

## 2014-10-18 DIAGNOSIS — K648 Other hemorrhoids: Secondary | ICD-10-CM | POA: Insufficient documentation

## 2014-10-18 DIAGNOSIS — K2950 Unspecified chronic gastritis without bleeding: Secondary | ICD-10-CM | POA: Insufficient documentation

## 2014-10-18 DIAGNOSIS — K2970 Gastritis, unspecified, without bleeding: Secondary | ICD-10-CM | POA: Insufficient documentation

## 2014-10-18 DIAGNOSIS — I10 Essential (primary) hypertension: Secondary | ICD-10-CM | POA: Insufficient documentation

## 2014-10-18 DIAGNOSIS — F17210 Nicotine dependence, cigarettes, uncomplicated: Secondary | ICD-10-CM | POA: Insufficient documentation

## 2014-10-18 HISTORY — PX: ESOPHAGOGASTRODUODENOSCOPY (EGD) WITH PROPOFOL: SHX5813

## 2014-10-18 HISTORY — PX: BIOPSY: SHX5522

## 2014-10-18 HISTORY — PX: COLONOSCOPY WITH PROPOFOL: SHX5780

## 2014-10-18 SURGERY — COLONOSCOPY WITH PROPOFOL
Anesthesia: Monitor Anesthesia Care

## 2014-10-18 MED ORDER — PROPOFOL INFUSION 10 MG/ML OPTIME
INTRAVENOUS | Status: DC | PRN
  Administered 2014-10-18: 12:00:00 via INTRAVENOUS
  Administered 2014-10-18: 100 ug/kg/min via INTRAVENOUS

## 2014-10-18 MED ORDER — FENTANYL CITRATE (PF) 100 MCG/2ML IJ SOLN
INTRAMUSCULAR | Status: AC
  Filled 2014-10-18: qty 4

## 2014-10-18 MED ORDER — FENTANYL CITRATE (PF) 100 MCG/2ML IJ SOLN
25.0000 ug | INTRAMUSCULAR | Status: AC
  Administered 2014-10-18: 25 ug via INTRAVENOUS
  Filled 2014-10-18: qty 2

## 2014-10-18 MED ORDER — PROPOFOL 10 MG/ML IV BOLUS
INTRAVENOUS | Status: AC
  Filled 2014-10-18: qty 20

## 2014-10-18 MED ORDER — SIMETHICONE 40 MG/0.6ML PO SUSP
ORAL | Status: DC | PRN
  Administered 2014-10-18: 1000 mL

## 2014-10-18 MED ORDER — LIDOCAINE HCL (PF) 1 % IJ SOLN
INTRAMUSCULAR | Status: AC
  Filled 2014-10-18: qty 5

## 2014-10-18 MED ORDER — GLYCOPYRROLATE 0.2 MG/ML IJ SOLN
0.2000 mg | Freq: Once | INTRAMUSCULAR | Status: AC
  Administered 2014-10-18: 0.2 mg via INTRAVENOUS
  Filled 2014-10-18: qty 1

## 2014-10-18 MED ORDER — MIDAZOLAM HCL 2 MG/2ML IJ SOLN
1.0000 mg | INTRAMUSCULAR | Status: DC | PRN
  Administered 2014-10-18: 2 mg via INTRAVENOUS
  Filled 2014-10-18: qty 2

## 2014-10-18 MED ORDER — FENTANYL CITRATE (PF) 100 MCG/2ML IJ SOLN: 25.0000 ug | INTRAMUSCULAR | Status: DC | PRN

## 2014-10-18 MED ORDER — LIDOCAINE VISCOUS 2 % MT SOLN
OROMUCOSAL | Status: AC
  Filled 2014-10-18: qty 15

## 2014-10-18 MED ORDER — ONDANSETRON HCL 4 MG/2ML IJ SOLN
4.0000 mg | Freq: Once | INTRAMUSCULAR | Status: AC
Start: 2014-10-18 — End: 2014-10-18
  Administered 2014-10-18: 4 mg via INTRAVENOUS
  Filled 2014-10-18: qty 2

## 2014-10-18 MED ORDER — MIDAZOLAM HCL 2 MG/2ML IJ SOLN
INTRAMUSCULAR | Status: AC
  Filled 2014-10-18: qty 2

## 2014-10-18 MED ORDER — LACTATED RINGERS IV SOLN
INTRAVENOUS | Status: DC
  Administered 2014-10-18: 11:00:00 via INTRAVENOUS

## 2014-10-18 MED ORDER — ONDANSETRON HCL 4 MG/2ML IJ SOLN: 4.0000 mg | Freq: Once | INTRAMUSCULAR | Status: DC | PRN

## 2014-10-18 SURGICAL SUPPLY — 9 items
BLOCK BITE 60FR ADLT L/F BLUE (MISCELLANEOUS) ×2 IMPLANT
FLOOR PAD 36X40 (MISCELLANEOUS) ×4
FORCEPS BIOP RAD 4 LRG CAP 4 (CUTTING FORCEPS) ×2 IMPLANT
FORMALIN 10 PREFIL 20ML (MISCELLANEOUS) ×2 IMPLANT
KIT ENDO PROCEDURE PEN (KITS) ×4 IMPLANT
MANIFOLD NEPTUNE II (INSTRUMENTS) ×4 IMPLANT
PAD FLOOR 36X40 (MISCELLANEOUS) ×2 IMPLANT
TUBING IRRIGATION ENDOGATOR (MISCELLANEOUS) ×2 IMPLANT
WATER STERILE IRR 1000ML POUR (IV SOLUTION) ×2 IMPLANT

## 2014-10-18 NOTE — Transfer of Care (Signed)
Immediate Anesthesia Transfer of Care Note  Patient: Samuel Irwin  Procedure(s) Performed: Procedure(s): COLONOSCOPY WITH PROPOFOL (Procedure #1) At cecum at 1159, withdrawal time=11 minutes (N/A) ESOPHAGOGASTRODUODENOSCOPY (EGD) WITH PROPOFOL (N/A) BIOPSY (Gastric)  Patient Location: PACU  Anesthesia Type:MAC  Level of Consciousness: awake, alert , oriented and patient cooperative  Airway & Oxygen Therapy: Patient Spontanous Breathing and Patient connected to nasal cannula oxygen  Post-op Assessment: Report given to RN and Post -op Vital signs reviewed and stable  Post vital signs: Reviewed and stable  Last Vitals:  Filed Vitals:   10/18/14 1135  BP: 113/77  Resp: 13    Complications: No apparent anesthesia complications

## 2014-10-18 NOTE — Op Note (Signed)
[facility] [address] [place], [address]   ENDOSCOPY PROCEDURE REPORT  PATIENT: Samuel, Irwin  MR#: [id number] BIRTHDATE: [birth year] , 73  yrs. old GENDER: male  ENDOSCOPIST: Danie Binder, MD REFERRED TM:[id number] Advani, MD  PROCEDURE DATE: Nov 08, 2014 PROCEDURE:   EGD w/ biopsy  INDICATIONS:dyspepsia. USES ASA/NSAIDS MEDICATIONS: Monitored anesthesia care TOPICAL ANESTHETIC:   Viscous Xylocaine ASA CLASS:  DESCRIPTION OF PROCEDURE:     Physical exam was performed.  Informed consent was obtained from the patient after explaining the benefits, risks, and alternatives to the procedure.  The patient was connected to the monitor and placed in the left lateral position.  Continuous oxygen was provided by nasal cannula and IV medicine administered through an indwelling cannula.  After administration of sedation, the patients esophagus was intubated and the     endoscope was advanced under direct visualization to the second portion of the duodenum.  The scope was removed slowly by carefully examining the color, texture, anatomy, and integrity of the mucosa on the way out.  The patient was recovered in endoscopy and discharged home in satisfactory condition.  Estimated blood loss is zero unless otherwise noted in this procedure report.     ESOPHAGUS: The mucosa of the esophagus appeared normal.   STOMACH: Moderate erosive gastritis (inflammation) was found at the pylorus and in the gastric antrum.  Multiple biopsies were performed using cold forceps.   DUODENUM: The duodenal mucosa showed no abnormalities in the bulb and 2nd part of the duodenum. COMPLICATIONS: There were no immediate complications.  ENDOSCOPIC IMPRESSION: 1.   EPIGASTRIC PAIN DUE TO MODERATE NSAID gastritis  RECOMMENDATIONS: DRINK WATER TO KEEP URINE LIGHT YELLOW. CONTINUE WEIGHT LOSS EFFORTS. FOLLOW A HIGH FIBER/LOW FAT DIET. AVOID ITEMS THAT TRIGGER GASTRITIS.  SEE INFO  BELOW. CONTINUE OMEPRAZOLE.  TAKE 30 MINUTES PRIOR TO YOUR FIRST MEAL. USE PREPARATION H 2 TO 4 TIMES A DAY AS NEEDED TO RELIEVE RECTAL PRESSURE/ITCHING/BLEEDING. AWAIT BIOPSY RESULTS. FOLLOW UP IN 4 MOS. NEXT TCS IN 10 YEARS.  REPEAT EXAM:    eSigned:  Danie Binder, MD November 08, 2014 1:48 PM   CPT CODES: ICD CODES:  The ICD and CPT codes recommended by this software are interpretations from the data that the clinical staff has captured with the software.  The verification of the translation of this report to the ICD and CPT codes and modifiers is the sole responsibility of the health care institution and practicing physician where this report was generated.  [employer] will not be held responsible for the validity of the ICD and CPT codes included on this report.  AMA assumes no liability for data contained or not contained herein. [employer] is a Designer, television/film set of the [employer].

## 2014-10-18 NOTE — Discharge Instructions (Signed)
You have REFLUX. YOUR UPPER ENDOSCOPY SHOWED A HIATAL HERNIA AND GASTRITIS DUE TO USING ASPIRIN PRODUCTS AND BEER. YOU DID NOT HAVE ANY POLYPS. You have MODERATE SIZE internal AND EXTERNAL hemorrhoids, WHICH CAN CAUSE RECTAL BLEEDING AND PAIN/ITCHING. I biopsied your stomach.   DRINK WATER TO KEEP YOUR URINE LIGHT YELLOW.  CONTINUE YOUR WEIGHT LOSS EFFORTS. LOSE 20 LBS. IT WILL HELP YOUR REFLUX BE BETTER CONTROLLED AND DECREASE YOUR RISK FOR COLON CANCER.  FOLLOW A HIGH FIBER/LOW FAT DIET. AVOID ITEMS THAT CAUSE BLOATING. SEE INFO BELOW.  AVOID ITEMS THAT TRIGGER GASTRITIS. SEE INFO BELOW.  CONTINUE OMEPRAZOLE.  TAKE 30 MINUTES PRIOR TO YOUR FIRST MEAL.  USE PREPARATION H 2 TO 4 TIMES A DAY AS NEEDED TO RELIEVE RECTAL PRESSURE/ITCHING/BLEEDING.  YOUR BIOPSY RESULTS WILL BE AVAILABLE IN MY CHART AFTER AUG 25 AND MY OFFICE WILL CONTACT YOU IN 10-14 DAYS WITH YOUR RESULTS.   FOLLOW UP IN 4 MOS.   ENDOSCOPY Care After Read the instructions outlined below and refer to this sheet in the next week. These discharge instructions provide you with general information on caring for yourself after you leave the hospital. While your treatment has been planned according to the most current medical practices available, unavoidable complications occasionally occur. If you have any problems or questions after discharge, call DR. Lakeasha Petion, [phone].  ACTIVITY  You may resume your regular activity, but move at a slower pace for the next 24 hours.   Take frequent rest periods for the next 24 hours.   Walking will help get rid of the air and reduce the bloated feeling in your belly (abdomen).   No driving for 24 hours (because of the medicine (anesthesia) used during the test).   You may shower.   Do not sign any important legal documents or operate any machinery for 24 hours (because of the anesthesia used during the test).    NUTRITION  Drink plenty of fluids.   You may resume your normal  diet as instructed by your doctor.   Begin with a light meal and progress to your normal diet. Heavy or fried foods are harder to digest and may make you feel sick to your stomach (nauseated).   Avoid alcoholic beverages for 24 hours or as instructed.    MEDICATIONS  You may resume your normal medications.   WHAT YOU CAN EXPECT TODAY  Some feelings of bloating in the abdomen.   Passage of more gas than usual.   Spotting of blood in your stool or on the toilet paper  .  IF YOU HAD POLYPS REMOVED DURING THE ENDOSCOPY:  Eat a soft diet IF YOU HAVE NAUSEA, BLOATING, ABDOMINAL PAIN, OR VOMITING.    FINDING OUT THE RESULTS OF YOUR TEST Not all test results are available during your visit. DR. Oneida Alar WILL CALL YOU WITHIN 14 DAYS OF YOUR PROCEDUE WITH YOUR RESULTS. Do not assume everything is normal if you have not heard from DR. Mckinzy Fuller, CALL HER OFFICE AT [phone].  SEEK IMMEDIATE MEDICAL ATTENTION AND CALL THE OFFICE: [phone] IF:  You have more than a spotting of blood in your stool.   Your belly is swollen (abdominal distention).   You are nauseated or vomiting.   You have a temperature over 101F.   You have abdominal pain or discomfort that is severe or gets worse throughout the day.   Gastritis  Gastritis is an inflammation (the body's way of reacting to injury and/or infection) of the stomach. It is often caused  by bacterial (germ) infections. It can also be caused BY ASPIRIN, BC/GOODY POWDER'S, (IBUPROFEN) MOTRIN, OR ALEVE (NAPROXEN), chemicals (including alcohol), SPICY FOODS, and medications. This illness may be associated with generalized malaise (feeling tired, not well), UPPER ABDOMINAL STOMACH cramps, and fever. One common bacterial cause of gastritis is an organism known as H. Pylori. This can be treated with antibiotics.   REFLUX/HIATAL HERNIA  A hiatal hernia occurs when a part of the stomach slides above the diaphragm. The diaphragm is the thin muscle  separating the belly (abdomen) from the chest. A hiatal hernia can be something you are born with or develop over time. Hiatal hernias may allow stomach acid to flow back into your esophagus, the tube which carries food from your mouth to your stomach. If this acid causes problems it is called GERD (gastro-esophageal reflux disease).   SYMPTOMS Common symptoms of GERD are heartburn (burning in your chest OR UPPER STOMACH). This is worse when lying down or bending over. It may also cause belching and indigestion. Some of the things which make GERD worse are:  Increased weight pushes on stomach making acid rise more easily.   Smoking markedly increases acid production.   Alcohol decreases lower esophageal sphincter pressure (valve between stomach and esophagus), allowing acid from stomach into esophagus.   Late evening meals and going to bed with a full stomach increases pressure.    HOME CARE INSTRUCTIONS  Try to achieve and maintain an ideal body weight.   Avoid drinking alcoholic beverages.   DO NOT smokE.   Do not wear tight clothing around your chest or stomach.   Eat smaller meals and eat more frequently. This keeps your stomach from getting too full. Eat slowly.   Do not lie down for 2 or 3 hours after eating. Do not eat or drink anything 1 to 2 hours before going to bed.   Avoid caffeine beverages (colas, coffee, cocoa, tea), fatty foods, citrus fruits and all other foods and drinks that contain acid and that seem to increase the problems.   Avoid bending over, especially after eating OR STRAINING. Anything that increases the pressure in your belly increases the amount of acid that may be pushed up into your esophagus.    High-Fiber Diet A high-fiber diet changes your normal diet to include more whole grains, legumes, fruits, and vegetables. Changes in the diet involve replacing refined carbohydrates with unrefined foods. The calorie level of the diet is essentially unchanged.  The Dietary Reference Intake (recommended amount) for adult males is 38 grams per day. For adult females, it is 25 grams per day. Pregnant and lactating women should consume 28 grams of fiber per day. Fiber is the intact part of a plant that is not broken down during digestion. Functional fiber is fiber that has been isolated from the plant to provide a beneficial effect in the body. PURPOSE  Increase stool bulk.   Ease and regulate bowel movements.   Lower cholesterol.   REDUCE RISK OF COLON CANCER  INDICATIONS THAT YOU NEED MORE FIBER  Constipation and hemorrhoids.   Uncomplicated diverticulosis (intestine condition) and irritable bowel syndrome.   Weight management.   As a protective measure against hardening of the arteries (atherosclerosis), diabetes, and cancer.   GUIDELINES FOR INCREASING FIBER IN THE DIET  Start adding fiber to the diet slowly. A gradual increase of about 5 more grams (2 slices of whole-wheat bread, 2 servings of most fruits or vegetables, or 1 bowl of high-fiber cereal) per  day is best. Too rapid an increase in fiber may result in constipation, flatulence, and bloating.   Drink enough water and fluids to keep your urine clear or pale yellow. Water, juice, or caffeine-free drinks are recommended. Not drinking enough fluid may cause constipation.   Eat a variety of high-fiber foods rather than one type of fiber.   Try to increase your intake of fiber through using high-fiber foods rather than fiber pills or supplements that contain small amounts of fiber.   The goal is to change the types of food eaten. Do not supplement your present diet with high-fiber foods, but replace foods in your present diet.  INCLUDE A VARIETY OF FIBER SOURCES  Replace refined and processed grains with whole grains, canned fruits with fresh fruits, and incorporate other fiber sources. White rice, white breads, and most bakery goods contain little or no fiber.   Brown whole-grain  rice, buckwheat oats, and many fruits and vegetables are all good sources of fiber. These include: broccoli, Brussels sprouts, cabbage, cauliflower, beets, sweet potatoes, white potatoes (skin on), carrots, tomatoes, eggplant, squash, berries, fresh fruits, and dried fruits.   Cereals appear to be the richest source of fiber. Cereal fiber is found in whole grains and bran. Bran is the fiber-rich outer coat of cereal grain, which is largely removed in refining. In whole-grain cereals, the bran remains. In breakfast cereals, the largest amount of fiber is found in those with "bran" in their names. The fiber content is sometimes indicated on the label.   You may need to include additional fruits and vegetables each day.   In baking, for 1 cup white flour, you may use the following substitutions:   1 cup whole-wheat flour minus 2 tablespoons.   1/2 cup white flour plus 1/2 cup whole-wheat flour.   Low-Fat Diet BREADS, CEREALS, PASTA, RICE, DRIED PEAS, AND BEANS These products are high in carbohydrates and most are low in fat. Therefore, they can be increased in the diet as substitutes for fatty foods. They too, however, contain calories and should not be eaten in excess. Cereals can be eaten for snacks as well as for breakfast.  Include foods that contain fiber (fruits, vegetables, whole grains, and legumes). Research shows that fiber may lower blood cholesterol levels, especially the water-soluble fiber found in fruits, vegetables, oat products, and legumes. FRUITS AND VEGETABLES It is good to eat fruits and vegetables. Besides being sources of fiber, both are rich in vitamins and some minerals. They help you get the daily allowances of these nutrients. Fruits and vegetables can be used for snacks and desserts. MEATS Limit lean meat, chicken, [place], and fish to no more than 6 ounces per day. Beef, Pork, and Lamb Use lean cuts of beef, pork, and lamb. Lean cuts include:  Extra-lean ground beef.    Arm roast.  Sirloin tip.  Center-cut ham.  Round steak.  Loin chops.  Rump roast.  Tenderloin.  Trim all fat off the outside of meats before cooking. It is not necessary to severely decrease the intake of red meat, but lean choices should be made. Lean meat is rich in protein and contains a highly absorbable form of iron. Premenopausal women, in particular, should avoid reducing lean red meat because this could increase the risk for low red blood cells (iron-deficiency anemia). The organ meats, such as liver, sweetbreads, kidneys, and brain are very rich in cholesterol. They should be limited. Chicken and [place] These are good sources of protein. The fat of  poultry can be reduced by removing the skin and underlying fat layers before cooking. Chicken and [place] can be substituted for lean red meat in the diet. Poultry should not be fried or covered with high-fat sauces. Fish and Shellfish Fish is a good source of protein. Shellfish contain cholesterol, but they usually are low in saturated fatty acids. The preparation of fish is important. Like chicken and [place], they should not be fried or covered with high-fat sauces. EGGS Egg whites contain no fat or cholesterol. They can be eaten often. Try 1 to 2 egg whites instead of whole eggs in recipes or use egg substitutes that do not contain yolk. MILK AND DAIRY PRODUCTS Use skim or 1% milk instead of 2% or whole milk. Decrease whole milk, natural, and processed cheeses. Use nonfat or low-fat (2%) cottage cheese or low-fat cheeses made from vegetable oils. Choose nonfat or low-fat (1 to 2%) yogurt. Experiment with evaporated skim milk in recipes that call for heavy cream. Substitute low-fat yogurt or low-fat cottage cheese for sour cream in dips and salad dressings. Have at least 2 servings of low-fat dairy products, such as 2 glasses of skim (or 1%) milk each day to help get your daily calcium intake.  FATS AND OILS Reduce the total intake of fats,  especially saturated fat. Butterfat, lard, and beef fats are high in saturated fat and cholesterol. These should be avoided as much as possible. Vegetable fats do not contain cholesterol, but certain vegetable fats, such as coconut oil, palm oil, and palm kernel oil are very high in saturated fats. These should be limited. These fats are often used in bakery goods, processed foods, popcorn, oils, and nondairy creamers. Vegetable shortenings and some peanut butters contain hydrogenated oils, which are also saturated fats. Read the labels on these foods and check for saturated vegetable oils. Unsaturated vegetable oils and fats do not raise blood cholesterol. However, they should be limited because they are fats and are high in calories. Total fat should still be limited to 30% of your daily caloric intake. Desirable liquid vegetable oils are corn oil, cottonseed oil, olive oil, canola oil, safflower oil, soybean oil, and sunflower oil. Peanut oil is not as good, but small amounts are acceptable. Buy a heart-healthy tub margarine that has no partially hydrogenated oils in the ingredients. Mayonnaise and salad dressings often are made from unsaturated fats, but they should also be limited because of their high calorie and fat content. Seeds, nuts, peanut butter, olives, and avocados are high in fat, but the fat is mainly the unsaturated type. These foods should be limited mainly to avoid excess calories and fat. OTHER EATING TIPS Snacks  Most sweets should be limited as snacks. They tend to be rich in calories and fats, and their caloric content outweighs their nutritional value. Some good choices in snacks are graham crackers, melba toast, soda crackers, bagels (no egg), English muffins, fruits, and vegetables. These snacks are preferable to snack crackers, [place] fries, and chips. Popcorn should be air-popped or cooked in small amounts of liquid vegetable oil. Desserts Eat fruit, low-fat yogurt, and fruit  ices. AVOID pastries, cake, and cookies. Sherbet, angel food cake, gelatin dessert, frozen low-fat yogurt, or other frozen products that do not contain saturated fat (pure fruit juice bars, frozen ice pops) are also acceptable.  COOKING METHODS Choose those methods that use little or no fat. They include: Poaching.  Braising.  Steaming.  Grilling.  Baking.  Stir-frying.  Broiling.  Microwaving.  Foods  can be cooked in a nonstick pan without added fat, or use a nonfat cooking spray in regular cookware. Limit fried foods and avoid frying in saturated fat. Add moisture to lean meats by using water, broth, cooking wines, and other nonfat or low-fat sauces along with the cooking methods mentioned above. Soups and stews should be chilled after cooking. The fat that forms on top after a few hours in the refrigerator should be skimmed off. When preparing meals, avoid using excess salt. Salt can contribute to raising blood pressure in some people. EATING AWAY FROM HOME Order entres, potatoes, and vegetables without sauces or butter. When meat exceeds the size of a deck of cards (3 to 4 ounces), the rest can be taken home for another meal. Choose vegetable or fruit salads and ask for low-calorie salad dressings to be served on the side. Use dressings sparingly. Limit high-fat toppings, such as bacon, crumbled eggs, cheese, sunflower seeds, and olives. Ask for heart-healthy tub margarine instead of butter.  Hemorrhoids Hemorrhoids are dilated (enlarged) veins around the rectum. Sometimes clots will form in the veins. This makes them swollen and painful. These are called thrombosed hemorrhoids. Causes of hemorrhoids include:  Constipation.   Straining to have a bowel movement.   HEAVY LIFTING  HOME CARE INSTRUCTIONS  Eat a well balanced diet and drink 6 to 8 glasses of water every day to avoid constipation. You may also use a bulk laxative.   Avoid straining to have bowel movements.   Keep anal  area dry and clean.   Do not use a donut shaped pillow or sit on the toilet for long periods. This increases blood pooling and pain.   Move your bowels when your body has the urge; this will require less straining and will decrease pain and pressure.

## 2014-10-18 NOTE — Op Note (Signed)
[facility] [address] [place], [address]   COLONOSCOPY PROCEDURE REPORT  PATIENT: Samuel, Irwin  MR#: [id number] BIRTHDATE: [birth year] , 60  yrs. old GENDER: male ENDOSCOPIST: Danie Binder, MD REFERRED [id number] Advani, MD PROCEDURE DATE:  10-21-2014 PROCEDURE:   Colonoscopy, diagnostic INDICATIONS:hematochezia. MEDICATIONS: Monitored anesthesia care  DESCRIPTION OF PROCEDURE:    Physical exam was performed.  Informed consent was obtained from the patient after explaining the benefits, risks, and alternatives to procedure.  The patient was connected to monitor and placed in left lateral position. Continuous oxygen was provided by nasal cannula and IV medicine administered through an indwelling cannula.  After administration of sedation and rectal exam, the patients rectum was intubated and the     colonoscope was advanced under direct visualization to the cecum.  The scope was removed slowly by carefully examining the color, texture, anatomy, and integrity mucosa on the way out.  The patient was recovered in endoscopy and discharged home in satisfactory condition. Estimated blood loss is zero unless otherwise noted in this procedure report.     COLON FINDINGS: The colon was redundant.  OTHERWISE, The colonic mucosa appeared normal.  , and Moderate sized external and internal hemorrhoids were found.  PREP QUALITY: good.  CECAL W/D TIME: 11       minutes COMPLICATIONS: None  ENDOSCOPIC IMPRESSION: 1.   The LEFT colon IS redundant 2.   The colonic mucosa appeared normal 3.   RECTAL BLEEDING/ITCHING DUE TO Moderate sized external and internal hemorrhoids  RECOMMENDATIONS: DRINK WATER TO KEEP URINE LIGHT YELLOW. CONTINUE WEIGHT LOSS EFFORTS. FOLLOW A HIGH FIBER/LOW FAT DIET. AVOID ITEMS THAT TRIGGER GASTRITIS.  SEE INFO BELOW. CONTINUE OMEPRAZOLE.  TAKE 30 MINUTES PRIOR TO YOUR FIRST MEAL. USE PREPARATION H 2 TO 4 TIMES A DAY AS NEEDED TO  RELIEVE RECTAL PRESSURE/ITCHING/BLEEDING. AWAIT BIOPSY RESULTS. FOLLOW UP IN 4 MOS. NEXT TCS IN 10 YEARS.  _______________________________ eSignedDanie Binder, MD 2014/10/21 1:14 PM    CPT CODES: ICD CODES:  The ICD and CPT codes recommended by this software are interpretations from the data that the clinical staff has captured with the software.  The verification of the translation of this report to the ICD and CPT codes and modifiers is the sole responsibility of the health care institution and practicing physician where this report was generated.  [employer] will not be held responsible for the validity of the ICD and CPT codes included on this report.  AMA assumes no liability for data contained or not contained herein. [employer] is a Designer, television/film set of the [employer].

## 2014-10-18 NOTE — H&P (Signed)
Primary Care Physician:  Lorayne Marek, MD Primary Gastroenterologist:  Dr. Oneida Alar  Pre-Procedure History & Physical: HPI:  Samuel Irwin is a 54 y.o. male here for RECTAL BLEEDING/DYSPEPSIA.  Past Medical History  Diagnosis Date  . GERD (gastroesophageal reflux disease)   . Umbilical hernia   . Constipation   . Cough   . Wheezing   . Weakness   . Generalized headaches   . Heart murmur   . H/O hiatal hernia   . Herpes   . Hypertension     Past Surgical History  Procedure Laterality Date  . Femur surgery      left  . Toe surgery      dislocated lt foot  . Carpal tunnel release      R hand  . Umbilical hernia repair N/A 04/07/2012    Procedure: HERNIA REPAIR UMBILICAL ADULT;  Surgeon: Madilyn Hook, DO;  Location: [facility];  Service: General;  Laterality: N/A;  open umbilical hernia repair with mesh  . Insertion of mesh N/A 04/07/2012    Procedure: INSERTION OF MESH;  Surgeon: Madilyn Hook, DO;  Location: [facility];  Service: General;  Laterality: N/A;  . Hernia repair  04/07/12    umb hernia repair  . Knee arthroscopy      right    Prior to Admission medications   Medication Sig Start Date End Date Taking? Authorizing Provider  acyclovir (ZOVIRAX) 400 MG tablet Take 1 tablet (400 mg total) by mouth 2 (two) times daily. 08/08/14  Yes Deepak Advani, MD  hydrochlorothiazide (HYDRODIURIL) 25 MG tablet TAKE 1 TABLET BY MOUTH DAILY. 06/14/14  Yes Lorayne Marek, MD  Multiple Vitamin (MULTIVITAMIN) capsule Take 1 capsule by mouth daily.   Yes Historical Provider, MD  omeprazole (PRILOSEC) 20 MG capsule Take 1 capsule (20 mg total) by mouth daily. Patient taking differently: Take 20 mg by mouth daily as needed (acid reflux).  08/31/14  Yes Lorayne Marek, MD  tadalafil (CIALIS) 20 MG tablet Take 1 tablet (20 mg total) by mouth daily as needed for erectile dysfunction. 06/06/14  Yes Lorayne Marek, MD  Vitamin D, Ergocalciferol, (DRISDOL) 50000 UNITS CAPS capsule Take 1 capsule (50,000 Units  total) by mouth every 7 (seven) days. 09/01/14  Yes Lorayne Marek, MD    Allergies as of 09/21/2014  . (No Known Allergies)    Family History  Problem Relation Age of Onset  . Cancer Father     lung  . Colon cancer Neg Hx   . Hypertension Mother     Social History   Social History  . Marital Status: Single    Spouse Name: N/A  . Number of Children: N/A  . Years of Education: N/A   Occupational History  . unemployed    Social History Main Topics  . Smoking status: Current Some Day Smoker -- 0.25 packs/day for 15 years    Types: Cigarettes  . Smokeless tobacco: Not on file     Comment: occas still smokes when he drinks  . Alcohol Use: 0.0 oz/week    0 Standard drinks or equivalent per week     Comment: social drinks, drinks 80-120 ounces of beer three times weekly (09/21/14)  . Drug Use: No  . Sexual Activity: Not on file     Comment: "when I drink"   Other Topics Concern  . Not on file   Social History Narrative    Review of Systems: See HPI, otherwise negative ROS   Physical Exam: There were no  vitals taken for this visit. General:   Alert,  pleasant and cooperative in NAD Head:  Normocephalic and atraumatic. Neck:  Supple; Lungs:  Clear throughout to auscultation.    Heart:  Regular rate and rhythm. Abdomen:  Soft, nontender and nondistended. Normal bowel sounds, without guarding, and without rebound.   Neurologic:  Alert and  oriented x4;  grossly normal neurologically.  Impression/Plan:     RECTAL BLEEDING/DYSPEPSIA  PLAN:  1. TCS/EGD/? Hemorrhoid banding TODAY

## 2014-10-18 NOTE — Anesthesia Preprocedure Evaluation (Addendum)
Anesthesia Evaluation  Patient identified by MRN, date of birth, ID band Patient awake    Reviewed: Allergy & Precautions, H&P , NPO status , Patient's Chart, lab work & pertinent test results, reviewed documented beta blocker date and time   History of Anesthesia Complications Negative for: history of anesthetic complications  Airway Mallampati: IV  TM Distance: >3 FB Neck ROM: full  Mouth opening: Limited Mouth Opening  Dental no notable dental hx. (+) Teeth Intact, Dental Advisory Given, Chipped   Pulmonary neg pulmonary ROS, shortness of breath, sleep apnea , pneumonia -, resolved, COPDCurrent Smoker,  breath sounds clear to auscultation  Pulmonary exam normal       Cardiovascular Exercise Tolerance: Good hypertension, Pt. on medications negative cardio ROS  + Valvular Problems/Murmurs Rhythm:regular Rate:Normal     Neuro/Psych  Headaches, negative neurological ROS  negative psych ROS   GI/Hepatic Neg liver ROS, hiatal hernia, GERD-  ,(+)     substance abuse  alcohol use,   Endo/Other  Morbid obesity  Renal/GU negative Renal ROS     Musculoskeletal   Abdominal   Peds  Hematology negative hematology ROS (+)   Anesthesia Other Findings       Reproductive/Obstetrics negative OB ROS                             Anesthesia Physical Anesthesia Plan  ASA: III  Anesthesia Plan: MAC   Post-op Pain Management:    Induction: Intravenous  Airway Management Planned: Simple Face Mask  Additional Equipment:   Intra-op Plan:   Post-operative Plan:   Informed Consent: I have reviewed the patients History and Physical, chart, labs and discussed the procedure including the risks, benefits and alternatives for the proposed anesthesia with the patient or authorized representative who has indicated his/her understanding and acceptance.     Plan Discussed with:   Anesthesia Plan  Comments:         Anesthesia Quick Evaluation

## 2014-10-18 NOTE — Anesthesia Postprocedure Evaluation (Signed)
  Anesthesia Post-op Note  Patient: Samuel Irwin  Procedure(s) Performed: Procedure(s): COLONOSCOPY WITH PROPOFOL (Procedure #1) At cecum at 1159, withdrawal time=11 minutes (N/A) ESOPHAGOGASTRODUODENOSCOPY (EGD) WITH PROPOFOL (N/A) BIOPSY (Gastric)  Patient Location: PACU  Anesthesia Type:MAC  Level of Consciousness: awake, alert , oriented and patient cooperative  Airway and Oxygen Therapy: Patient Spontanous Breathing and Patient connected to nasal cannula oxygen  Post-op Pain: none  Post-op Assessment: Post-op Vital signs reviewed, Patient's Cardiovascular Status Stable, Respiratory Function Stable, Patent Airway, No signs of Nausea or vomiting and Pain level controlled              Post-op Vital Signs: Reviewed and stable  Last Vitals:  Filed Vitals:   10/18/14 1135  BP: 113/77  Resp: 13    Complications: No apparent anesthesia complications

## 2014-10-20 ENCOUNTER — Encounter (HOSPITAL_COMMUNITY): Payer: Self-pay | Admitting: Gastroenterology

## 2014-10-21 ENCOUNTER — Telehealth: Payer: Self-pay | Admitting: Gastroenterology

## 2014-10-21 NOTE — Telephone Encounter (Signed)
Please call pt. His stomach Bx shows gastritis.  DRINK WATER TO KEEP YOUR URINE LIGHT YELLOW.  CONTINUE YOUR WEIGHT LOSS EFFORTS. LOSE 20 LBS. IT WILL HELP YOUR REFLUX BE BETTER CONTROLLED AND DECREASE YOUR RISK FOR COLON CANCER.  FOLLOW A HIGH FIBER/LOW FAT DIET. AVOID ITEMS THAT CAUSE BLOATING.  AVOID ITEMS THAT TRIGGER GASTRITIS.   CONTINUE OMEPRAZOLE.  TAKE 30 MINUTES PRIOR TO YOUR FIRST MEAL.  USE PREPARATION H 2 TO 4 TIMES A DAY AS NEEDED TO RELIEVE RECTAL PRESSURE/ITCHING/BLEEDING.  FOLLOW UP IN 4 MOS E30 GASTRITIS/HEMORRHOIDS.  NEXT TCS IN 10 YEARS.

## 2014-10-24 ENCOUNTER — Telehealth: Payer: Self-pay | Admitting: Pulmonary Disease

## 2014-10-24 ENCOUNTER — Encounter: Payer: Self-pay | Admitting: Gastroenterology

## 2014-10-24 NOTE — Telephone Encounter (Signed)
Tried to call pt. Mailbox is full and cannot accept any messages.  Printing the info and mailing to pt.

## 2014-10-24 NOTE — Telephone Encounter (Signed)
APPOINTMENT MADE AND ON RECALL  °

## 2014-10-24 NOTE — Telephone Encounter (Signed)
Message made in error

## 2014-10-25 ENCOUNTER — Telehealth: Payer: Self-pay | Admitting: Pulmonary Disease

## 2014-10-25 NOTE — Telephone Encounter (Signed)
Patient obtained [name]'s cell phone number and called her on her Cell asking her what he needs to do to obtain BiPAP.   They set up 2 appointments and he no showed for both appointments. Called and spoke with patient and he said that he never received an appointment.   Gave patient the number for [facility] - [phone] and advised him to contact that number to schedule an appointment to have settings changed  Called and advised [name] that patient is calling their office to schedule appointment Nothing further needed.

## 2014-10-27 ENCOUNTER — Telehealth: Payer: Self-pay | Admitting: Pulmonary Disease
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
## 2014-11-01 NOTE — Telephone Encounter (Signed)
Spoke with patient-he states that the [facility] is trying to take his License away and really needs this taken care of asap. Will forward to VS to make him away.

## 2014-11-04 NOTE — Telephone Encounter (Signed)
Form completed.

## 2014-11-04 NOTE — Telephone Encounter (Signed)
Called pt. Samuel Irwin and unable to leave VM. WCB Does pt want to pick up forms? Forms are in triage. Copies made and place in VS scan folder.

## 2014-11-04 NOTE — Telephone Encounter (Signed)
Spoke with pt.  He is aware DMV forms are ready for pick up.  Pt states DMV is looking for a 30 day compliance of using the machine for appox 4 hours each night or his license will be taken.  States this information will not be available to them because he hasn't been able to use the bipap because he feels like he is "choking" with it on.  Pt states he's been trying for 2 yrs to get the machine comfortable for him to use but nothing is working.  OV scheduled with Dr. Halford Chessman for Monday, Sept 12 to discuss bipap issues.  Pt confirmed appt and will pick up forms during OV.    Forms placed with VS's schedule.

## 2014-11-04 NOTE — Telephone Encounter (Signed)
Pt would like to pick these forms up on Monday.  No phone call back needed.

## 2014-11-07 ENCOUNTER — Encounter: Payer: Self-pay | Admitting: Pulmonary Disease

## 2014-11-07 ENCOUNTER — Ambulatory Visit (INDEPENDENT_AMBULATORY_CARE_PROVIDER_SITE_OTHER): Payer: Self-pay | Admitting: Pulmonary Disease

## 2014-11-07 VITALS — BP 140/80 | HR 86 | Temp 98.2°F | Ht 68.0 in | Wt 256.6 lb

## 2014-11-07 DIAGNOSIS — G4733 Obstructive sleep apnea (adult) (pediatric): Secondary | ICD-10-CM

## 2014-11-07 NOTE — Progress Notes (Signed)
Chief Complaint  Patient presents with  . Follow-up    pt states he is doing better but he is still loosing his breath a little. maybe the pressure needs to be put up. pt tries to use BIPAP every night for about 3 hours the most. pt has only really been able to keep it on for about an hour without waking. the mask fits well for pt. DME: [facility]    History of Present Illness: Samuel Irwin is a 54 y.o. male with OSA.  He is concerned about losing his driving license because of dx of sleep apnea.  He has BiPAP and tries to use this as much as he can.  He has trouble adjusting to pressure, and wakes up with a choke.  He is trying to lose weight, but has actually gained 8 lbs since July 2016.    He does not work.  He lives in his mother's basement.  He seems to spend most of his time partying with his girlfriend, and stays up until late in evening and early morning hours.   TESTS: PSG 08/27/12 >> AHI 87  Past medical hx >> GERD, HH, HTN  Past surgical hx, Medications, Allergies, Family hx, Social hx all reviewed.   Physical Exam: BP 140/80 mmHg  Pulse 86  Temp(Src) 98.2 F (36.8 C) (Oral)  Ht 5\' 8"  (1.727 m)  Wt 256 lb 9.6 oz (116.393 kg)  BMI 39.02 kg/m2  SpO2 96%  General - No distress ENT - No sinus tenderness, no oral exudate, no LAN, MP 4, enlarged tongue Cardiac - s1s2 regular, no murmur Chest - No wheeze/rales/dullness Back - No focal tenderness Abd - Soft, non-tender Ext - No edema Neuro - Normal strength Skin - No rashes Psych - normal mood, and behavior  Assessment/Plan:  Obstructive sleep apnea. He has difficulty tolerating to PAP therapy. Plan: - will change to auto BiPAP with range of 10 to 20 cm H2O - will get download 1 week after pressure change, and then will determine if he is compliant with therapy; will use this information to update report to [facility] - if he is unable to tolerate PAP, then options are oral appliance and/or surgical assessment >> best  option is to stick with PAP therapy  Obesity. Plan: - discussed options to assist with weight loss - encouraged him to continue his exercise regimen - emphasized importance of monitoring his diet  Chesley Mires, MD [facility] Pager:  [phone]

## 2014-11-07 NOTE — Patient Instructions (Signed)
Will arrange for pressure change with BiPAP machine Will get download within 1 week and notify DMV of results  Follow up in 2 months

## 2014-11-09 ENCOUNTER — Telehealth: Payer: Self-pay | Admitting: Pulmonary Disease

## 2014-11-09 NOTE — Telephone Encounter (Signed)
Called and spoke with [name] from [facility] stated that pt has CPAP machine through the sleep apnea association. Pt paid fee to receive a machine Machine is not capable of auto settings as ordered from last OV with Dr Orene Desanctis stated that the only option for pt is to go back to sleep lab and have titration study so CPAP can be set to correct setting  Dr Halford Chessman, please advise in the next step you would like to take for pt. Thanks

## 2014-11-10 ENCOUNTER — Telehealth: Payer: Self-pay | Admitting: Pulmonary Disease

## 2014-11-10 DIAGNOSIS — G4733 Obstructive sleep apnea (adult) (pediatric): Secondary | ICD-10-CM

## 2014-11-10 NOTE — Telephone Encounter (Signed)
Samuel Irwin needs to be more creative.  Please have them send a current download from his machine.  I can adjust pressure setting accordingly from this information.

## 2014-11-10 NOTE — Telephone Encounter (Addendum)
Spoke with pt. Pt stated he is unable to have the auto setting VS requesting on his machine. VS already aware per 9.14.16 phone note. Pt stated he is needing the DMV papers submitted by 9.24.16. Advised pt once VS gets the download we can adjust BiPAP accordingly. Called [facility] and the pt will need to take his machine to [facility] to get download. Pt verbalized understanding.   Pt wanted to let VS know he has to have the information turned in to the [facility] by 9.24.16 - Will forward to VS as FYI.

## 2014-11-10 NOTE — Telephone Encounter (Signed)
Called spoke with [name]. Made aware of below. Nothing further needed

## 2014-11-11 NOTE — Telephone Encounter (Signed)
Patient came by office to see if he could get a copy of his DMV papers.  He was on his way to the social security office and wanted to take a copy of the papers with him.  He says that they are trying to take his license away from him.  I advised patient that Dr. Halford Chessman has the papers with him and will fill them out as soon as he can.  I told patient that if Social Security office needs the forms, we can fax the forms to them if they send [place] a request.  Patient also says that he would like Dr. Halford Chessman to do an extension on the [facility] papers for 30 days because they are trying to get him to pay up front and he is thinking of getting a Chief Executive Officer.

## 2014-11-11 NOTE — Telephone Encounter (Signed)
Patient came back after going to [facility] office.  Said that he has to go to appeals court through [employer]  Doesn't know when the date is.  Patient says that he is going to get a Chief Executive Officer.  Patient also says that he has concerns about his CPAP.  He cannot get the mask to fit properly.  Patient would like to have mask fitting.    Dr. Halford Chessman, ok to order mask fitting?  Please advise about this and below.  Thanks.

## 2014-11-11 NOTE — Telephone Encounter (Signed)
Pt is back in lobby  

## 2014-11-11 NOTE — Telephone Encounter (Signed)
Pt in the lobby

## 2014-11-11 NOTE — Telephone Encounter (Signed)
Noted  

## 2014-11-12 NOTE — Telephone Encounter (Signed)
Okay to send order for CPAP mask desensitization with sleep lab.

## 2014-11-14 NOTE — Telephone Encounter (Signed)
Order has been placed per VS. Pt is aware. Nothing further was needed at this time.

## 2014-11-15 ENCOUNTER — Ambulatory Visit (HOSPITAL_BASED_OUTPATIENT_CLINIC_OR_DEPARTMENT_OTHER): Payer: Self-pay | Attending: Pulmonary Disease | Admitting: Radiology

## 2014-11-15 DIAGNOSIS — Z9989 Dependence on other enabling machines and devices: Principal | ICD-10-CM

## 2014-11-15 DIAGNOSIS — G4733 Obstructive sleep apnea (adult) (pediatric): Secondary | ICD-10-CM

## 2014-11-17 ENCOUNTER — Telehealth: Payer: Self-pay | Admitting: Pulmonary Disease

## 2014-11-17 NOTE — Telephone Encounter (Signed)
ATC, na no vm wcb

## 2014-11-18 NOTE — Telephone Encounter (Signed)
Okay to send information to DOT asking for 30 day extension.

## 2014-11-18 NOTE — Telephone Encounter (Signed)
[phone] calling back

## 2014-11-18 NOTE — Telephone Encounter (Signed)
atc pt, line rang several times with no VM picking up.  Wcb.

## 2014-11-18 NOTE — Telephone Encounter (Signed)
Spoke with pt. He reports tomorrow is the deadline for his paperwork. He reports there is a number on the form to contact the DOT and he is wanting an extension for another 30 days. He reports he just got another mask and has to wait 30 days for compliance report. Please advise Dr. Halford Chessman thanks

## 2014-11-18 NOTE — Telephone Encounter (Signed)
Spoke with pt, aware of the 30 day extension.  DOT phone number needed to call is [phone]  Extension has been granted-new extension date  Is 01/03/15.    Pt aware of this extension date.  Nothing further needed.

## 2014-11-23 ENCOUNTER — Telehealth: Payer: Self-pay | Admitting: Pulmonary Disease

## 2014-11-23 DIAGNOSIS — G4733 Obstructive sleep apnea (adult) (pediatric): Secondary | ICD-10-CM

## 2014-11-23 NOTE — Telephone Encounter (Signed)
Pt reports he received his new mask and feels the pressure may be too strong now on BIPAP. He reports he feels like he is choking on the pressure. He knows he isn't wearing it 4 hrs a night d/t this. He has not taken BIPAP in for download. Please advise Dr. Halford Chessman thanks

## 2014-11-24 NOTE — Telephone Encounter (Signed)
Spoke with the pt and notified of recs per VS  He verbalized understanding  Nothing further needed

## 2014-11-24 NOTE — Telephone Encounter (Signed)
Called pt and LMTCB x1 Order has been placed to change settings.

## 2014-11-24 NOTE — Telephone Encounter (Signed)
Please have his DME change is BiPAP setting to 12/8 cm H2O.

## 2014-12-14 ENCOUNTER — Telehealth: Payer: Self-pay | Admitting: Internal Medicine

## 2014-12-15 ENCOUNTER — Telehealth: Payer: Self-pay | Admitting: Pulmonary Disease

## 2014-12-15 NOTE — Telephone Encounter (Signed)
Pt had come to the office with disability appeal forms requesting to have them filled out Pt refused to follow office protocol to pay the $25 fee and have them forwarded to medical records  Discussed with patient who had become irate with [employer] Roller Form taken to give to VS - pt is requesting to pick this up once complete Please call [phone]  Form placed in VS' lookat

## 2014-12-19 NOTE — Telephone Encounter (Signed)
Forms left up front for pt.  Pt aware.  Nothing further needed.

## 2014-12-19 NOTE — Telephone Encounter (Signed)
Form completed.

## 2014-12-27 ENCOUNTER — Other Ambulatory Visit: Payer: Self-pay | Admitting: Internal Medicine

## 2014-12-29 ENCOUNTER — Telehealth: Payer: Self-pay

## 2014-12-29 MED ORDER — ACYCLOVIR 400 MG PO TABS: 400.0000 mg | ORAL_TABLET | Freq: Two times a day (BID) | ORAL | Status: DC

## 2014-12-29 NOTE — Telephone Encounter (Signed)
Pt. Needs refill on his medication .Marland KitchenMarland Kitchenplease send to [facility] pharmacy.Marland KitchenMarland KitchenMarland KitchenMarland Kitchenplease call patient acyclovir (ZOVIRAX) 400 MG tablet [[id number]]       acyclovir (ZOVIRAX) 400 MG tablet [[id number]]

## 2014-12-29 NOTE — Telephone Encounter (Signed)
Patient called requesting a refill on his zovirax Prescription sent to pharmacy on file

## 2014-12-30 ENCOUNTER — Emergency Department (HOSPITAL_COMMUNITY): Payer: Self-pay

## 2014-12-30 ENCOUNTER — Encounter (HOSPITAL_COMMUNITY): Payer: Self-pay | Admitting: *Deleted

## 2014-12-30 ENCOUNTER — Emergency Department (HOSPITAL_COMMUNITY)
Admission: EM | Admit: 2014-12-30 | Discharge: 2014-12-30 | Disposition: A | Discharge status: Home / Self Care | Payer: Self-pay | Attending: Emergency Medicine | Admitting: Emergency Medicine

## 2014-12-30 DIAGNOSIS — R011 Cardiac murmur, unspecified: Secondary | ICD-10-CM | POA: Insufficient documentation

## 2014-12-30 DIAGNOSIS — M2550 Pain in unspecified joint: Secondary | ICD-10-CM

## 2014-12-30 DIAGNOSIS — Z720 Tobacco use: Secondary | ICD-10-CM | POA: Insufficient documentation

## 2014-12-30 DIAGNOSIS — S4991XA Unspecified injury of right shoulder and upper arm, initial encounter: Secondary | ICD-10-CM | POA: Insufficient documentation

## 2014-12-30 DIAGNOSIS — T148XXA Other injury of unspecified body region, initial encounter: Secondary | ICD-10-CM

## 2014-12-30 DIAGNOSIS — I10 Essential (primary) hypertension: Secondary | ICD-10-CM | POA: Insufficient documentation

## 2014-12-30 DIAGNOSIS — S8011XA Contusion of right lower leg, initial encounter: Secondary | ICD-10-CM | POA: Insufficient documentation

## 2014-12-30 DIAGNOSIS — Y998 Other external cause status: Secondary | ICD-10-CM | POA: Insufficient documentation

## 2014-12-30 DIAGNOSIS — Z79899 Other long term (current) drug therapy: Secondary | ICD-10-CM | POA: Insufficient documentation

## 2014-12-30 DIAGNOSIS — Y92410 Unspecified street and highway as the place of occurrence of the external cause: Secondary | ICD-10-CM | POA: Insufficient documentation

## 2014-12-30 DIAGNOSIS — Y9389 Activity, other specified: Secondary | ICD-10-CM | POA: Insufficient documentation

## 2014-12-30 DIAGNOSIS — S79912A Unspecified injury of left hip, initial encounter: Secondary | ICD-10-CM | POA: Insufficient documentation

## 2014-12-30 DIAGNOSIS — Z8619 Personal history of other infectious and parasitic diseases: Secondary | ICD-10-CM | POA: Insufficient documentation

## 2014-12-30 DIAGNOSIS — K219 Gastro-esophageal reflux disease without esophagitis: Secondary | ICD-10-CM | POA: Insufficient documentation

## 2014-12-30 MED ORDER — HYDROCODONE-ACETAMINOPHEN 5-325 MG PO TABS: 1.0000 | ORAL_TABLET | Freq: Four times a day (QID) | ORAL | Status: DC | PRN
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
## 2015-01-06 ENCOUNTER — Encounter: Payer: Self-pay | Admitting: Family Medicine

## 2015-01-06 ENCOUNTER — Ambulatory Visit: Payer: Self-pay | Attending: Family Medicine | Admitting: Family Medicine

## 2015-01-06 VITALS — BP 130/86 | HR 81 | Temp 98.9°F | Resp 18 | Ht 68.0 in | Wt 262.0 lb

## 2015-01-06 DIAGNOSIS — Z0000 Encounter for general adult medical examination without abnormal findings: Secondary | ICD-10-CM

## 2015-01-06 DIAGNOSIS — L03115 Cellulitis of right lower limb: Secondary | ICD-10-CM | POA: Insufficient documentation

## 2015-01-06 DIAGNOSIS — Z23 Encounter for immunization: Secondary | ICD-10-CM | POA: Insufficient documentation

## 2015-01-06 LAB — CBC
HEMATOCRIT: 45.9 % (ref 39.0–52.0)
Hemoglobin: 15.6 g/dL (ref 13.0–17.0)
MCH: 30.5 pg (ref 26.0–34.0)
MCHC: 34 g/dL (ref 30.0–36.0)
MCV: 89.6 fL (ref 78.0–100.0)
MPV: 9 fL (ref 8.6–12.4)
Platelets: 310 10*3/uL (ref 150–400)
RBC: 5.12 MIL/uL (ref 4.22–5.81)
RDW: 14.1 % (ref 11.5–15.5)
WBC: 8.2 10*3/uL (ref 4.0–10.5)

## 2015-01-06 LAB — POCT GLYCOSYLATED HEMOGLOBIN (HGB A1C): Hemoglobin A1C: 5.5

## 2015-01-06 LAB — GLUCOSE, POCT (MANUAL RESULT ENTRY): POC Glucose: 108 mg/dl — AB (ref 70–99)

## 2015-01-06 MED ORDER — ASPIRIN EC 81 MG PO TBEC: 81.0000 mg | DELAYED_RELEASE_TABLET | Freq: Every day | ORAL | Status: DC

## 2015-01-06 MED ORDER — SULFAMETHOXAZOLE-TRIMETHOPRIM 800-160 MG PO TABS: 1.0000 | ORAL_TABLET | Freq: Two times a day (BID) | ORAL | Status: DC

## 2015-01-06 NOTE — Assessment & Plan Note (Signed)
Bactrim Venous doppler  Elevate leg at home Ice pack or cold pack to reduce swelling Take pain medicine as needed  Take aspirin 81 mg daily while awaiting study to rule out clot

## 2015-01-06 NOTE — Progress Notes (Signed)
HFU leg swelling due to injury  Possible infected  Pain scale 3 No suicide thought in the past two week
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
## 2015-01-06 NOTE — Patient Instructions (Signed)
Adryel was seen today for leg swelling.  Diagnoses and all orders for this visit:  Healthcare maintenance -     Flu Vaccine QUAD 36+ mos IM  Cellulitis of leg, right -     HgB A1c -     Glucose (CBG) -     CBC -     sulfamethoxazole-trimethoprim (BACTRIM DS,SEPTRA DS) 800-160 MG tablet; Take 1 tablet by mouth 2 (two) times daily. -     VAS [place] LOWER EXTREMITY VENOUS (DVT); Future   Elevate leg at home Ice pack or cold pack to reduce swelling Take pain medicine as needed  Take aspirin 81 mg daily while awaiting study to rule out clot  F/u in 10-14 days   Dr. Adrian Blackwater

## 2015-01-09 ENCOUNTER — Telehealth: Payer: Self-pay | Admitting: *Deleted

## 2015-01-09 NOTE — Telephone Encounter (Signed)
DVT appointment tomorrow 01/10/2015 at [address] [facility] vascular lab Pt aware of appointment

## 2015-01-10 ENCOUNTER — Ambulatory Visit (HOSPITAL_COMMUNITY)
Admission: RE | Admit: 2015-01-10 | Discharge: 2015-01-10 | Disposition: A | Discharge status: Home / Self Care | Payer: No Typology Code available for payment source | Source: Ambulatory Visit | Attending: Family Medicine | Admitting: Family Medicine

## 2015-01-10 DIAGNOSIS — M7989 Other specified soft tissue disorders: Secondary | ICD-10-CM | POA: Insufficient documentation

## 2015-01-10 DIAGNOSIS — M7121 Synovial cyst of popliteal space [Baker], right knee: Secondary | ICD-10-CM | POA: Insufficient documentation

## 2015-01-10 DIAGNOSIS — L03115 Cellulitis of right lower limb: Secondary | ICD-10-CM

## 2015-01-10 DIAGNOSIS — I10 Essential (primary) hypertension: Secondary | ICD-10-CM | POA: Insufficient documentation

## 2015-01-10 DIAGNOSIS — M79604 Pain in right leg: Secondary | ICD-10-CM | POA: Insufficient documentation

## 2015-01-10 NOTE — Progress Notes (Signed)
VASCULAR LAB PRELIMINARY  PRELIMINARY  PRELIMINARY  PRELIMINARY  Right lower extremity venous duplex completed.    Preliminary report:  Right:  No evidence of DVT or superficial thrombosis.  Baker's cyst noted in the popliteal fossa.Ralene Cork, RVT 01/10/2015, 11:40 AM

## 2015-01-11 NOTE — Telephone Encounter (Signed)
-----   Message from Boykin Nearing, MD sent at 01/10/2015  3:43 PM EST ----- Negative venous doppler No clot in leg

## 2015-01-11 NOTE — Telephone Encounter (Signed)
LVm to return call

## 2015-01-11 NOTE — Telephone Encounter (Signed)
-----   Message from Boykin Nearing, MD sent at 01/09/2015 10:37 AM EST ----- Normal CBC

## 2015-01-12 ENCOUNTER — Telehealth: Payer: Self-pay | Admitting: Family Medicine

## 2015-01-12 NOTE — Telephone Encounter (Signed)
Pt. Called stating that he received a call form the Nurse. Please f/u with pt.

## 2015-01-13 ENCOUNTER — Telehealth: Payer: Self-pay | Admitting: Pulmonary Disease

## 2015-01-13 NOTE — Telephone Encounter (Signed)
Looks like pt was called by a schedule to schedule his 2 month follow up in November. Advised pt NA for Dr. Halford Chessman was not until March. He was not happy with this. I tried to schedule appt with TP but he kept interrupting stating he only needs to see Dr. Halford Chessman.  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 Please advise Dr. Halford Chessman thanks

## 2015-01-13 NOTE — Telephone Encounter (Signed)
Please explain to him that Tammy can address his issues with CPAP >> can schedule with her if he is okay with this.    Otherwise, can try to double book visit with me.

## 2015-01-13 NOTE — Telephone Encounter (Signed)
Called and spoke with pt and informed of VS rec of either seeing TP or VS for cpap concerns Pt informed that VS does not have any opening until the end of the year and that it would be easier to get him in with TP Pt stated that this was fine  Pt scheduled 01/30/15 at 2:45pm  Nothing further is needed

## 2015-01-27 ENCOUNTER — Telehealth: Payer: Self-pay | Admitting: Pulmonary Disease

## 2015-01-27 NOTE — Telephone Encounter (Signed)
Patient came to the office. I explained to him that he has an ov with TP on 01/30/15 and that for [place] to write a compliance report he will need to bring his chip in so that we can get a download. Patient states he has DOT forms as well that need to be faxed with this letter. Patient states that VS is aware of DOT forms and that deadline for them are 02/02/15. I explained to the patient that I would discuss the DOT forms with Ashtyn and confirm if they are ready for pick up. Patient voiced understanding and had no further questions.  Ashtyn has VS received forms

## 2015-01-27 NOTE — Telephone Encounter (Signed)
Patient Returned call [phone]

## 2015-01-27 NOTE — Telephone Encounter (Signed)
LVM for patient to return call. 

## 2015-01-27 NOTE — Telephone Encounter (Signed)
DOT forms are in chart under Media tab -- cannot fax these forms without a letter of CPAP compliance.  Patient has appt with TP 01/30/15 and was made aware to bring his card (per [place] below) to the appt.  LMTCB x 1 for pt to discuss.

## 2015-01-30 ENCOUNTER — Encounter: Payer: Self-pay | Admitting: Family Medicine

## 2015-01-30 ENCOUNTER — Ambulatory Visit (INDEPENDENT_AMBULATORY_CARE_PROVIDER_SITE_OTHER): Payer: Self-pay | Admitting: Adult Health

## 2015-01-30 ENCOUNTER — Ambulatory Visit: Payer: Self-pay | Attending: Family Medicine | Admitting: Family Medicine

## 2015-01-30 ENCOUNTER — Encounter: Payer: Self-pay | Admitting: Adult Health

## 2015-01-30 VITALS — BP 114/86 | HR 91 | Temp 98.3°F | Ht 68.0 in | Wt 253.0 lb

## 2015-01-30 DIAGNOSIS — X58XXXA Exposure to other specified factors, initial encounter: Secondary | ICD-10-CM | POA: Insufficient documentation

## 2015-01-30 DIAGNOSIS — M25561 Pain in right knee: Secondary | ICD-10-CM | POA: Insufficient documentation

## 2015-01-30 DIAGNOSIS — Z7982 Long term (current) use of aspirin: Secondary | ICD-10-CM | POA: Insufficient documentation

## 2015-01-30 DIAGNOSIS — S8011XA Contusion of right lower leg, initial encounter: Secondary | ICD-10-CM | POA: Insufficient documentation

## 2015-01-30 DIAGNOSIS — T1490 Injury, unspecified: Secondary | ICD-10-CM

## 2015-01-30 DIAGNOSIS — T148XXA Other injury of unspecified body region, initial encounter: Secondary | ICD-10-CM | POA: Insufficient documentation

## 2015-01-30 DIAGNOSIS — Z87891 Personal history of nicotine dependence: Secondary | ICD-10-CM | POA: Insufficient documentation

## 2015-01-30 DIAGNOSIS — M79604 Pain in right leg: Secondary | ICD-10-CM | POA: Insufficient documentation

## 2015-01-30 DIAGNOSIS — G4733 Obstructive sleep apnea (adult) (pediatric): Secondary | ICD-10-CM

## 2015-01-30 NOTE — Telephone Encounter (Signed)
No paperwork needed.

## 2015-01-30 NOTE — Telephone Encounter (Signed)
Patient came into office today for ov with TP. Patient brought his chip to appointment but was only able to receive 14 days of data.  Patient asked that DOT forms be faxed. He stated he needed 30 days of BiPAP data. I explained to the patient that he could take the chip to his DME and see if they are able to gather more data from chip. Once he has 30 days of compliance, he could bring the report to our office or have the DME to fax it to [place]. So that it could be reviewed by TP. Patient voiced understanding and had no further questions. Letter of compliance for his job was written and given to patient at ov. Nothing further needed. Will sign off on message.

## 2015-01-30 NOTE — Assessment & Plan Note (Signed)
A: hematoma of R shin P:  Reassurance Ice Compression Information provided

## 2015-01-30 NOTE — Telephone Encounter (Signed)
Patient given lab results during today's OV

## 2015-01-30 NOTE — Assessment & Plan Note (Signed)
Need 30 day download, he agrees to go by DME to get download.  Letter for workplace completed.   Plan  Continue on BIPAP support .  Do not drive if sleepy.  Work on weight loss.  Note to job done.  Take BIPAP chip to DME for download.  follow up Dr. Halford Chessman  In 3 months with download.

## 2015-01-30 NOTE — Telephone Encounter (Signed)
Patient given lab and doppler results in today's OV.

## 2015-01-30 NOTE — Progress Notes (Signed)
F/U Rt leg pain pain, swelling and redness  Lt arm and hand pain this morning  No pain today  No Tobacco user  No suicide thought two week

## 2015-01-30 NOTE — Assessment & Plan Note (Signed)
Wt loss  
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
## 2015-01-30 NOTE — Patient Instructions (Addendum)
Samuel Irwin was seen today for leg swelling.  Diagnoses and all orders for this visit:  Traumatic hematoma   Mr. Polit, you have hematoma. There was no abscess in your R shin.  This will take time to resolve You can also ice and compress the area to help with resolution.  Please apply for orange cards, so I can place referrals if needed.  F/u in 4-6 weeks to recheck hematoma and also discuss R knee pain and carpal tunnel  Dr. Adrian Blackwater   Hematoma A hematoma is a collection of blood under the skin, in an organ, in a body space, in a joint space, or in other tissue. The blood can clot to form a lump that you can see and feel. The lump is often firm and may sometimes become sore and tender. Most hematomas get better in a few days to weeks. However, some hematomas may be serious and require medical care. Hematomas can range in size from very small to very large. CAUSES  A hematoma can be caused by a blunt or penetrating injury. It can also be caused by spontaneous leakage from a blood vessel under the skin. Spontaneous leakage from a blood vessel is more likely to occur in older people, especially those taking blood thinners. Sometimes, a hematoma can develop after certain medical procedures. SIGNS AND SYMPTOMS   A firm lump on the body.  Possible pain and tenderness in the area.  Bruising.Blue, dark blue, purple-red, or yellowish skin may appear at the site of the hematoma if the hematoma is close to the surface of the skin. For hematomas in deeper tissues or body spaces, the signs and symptoms may be subtle. For example, an intra-abdominal hematoma may cause abdominal pain, weakness, fainting, and shortness of breath. An intracranial hematoma may cause a headache or symptoms such as weakness, trouble speaking, or a change in consciousness. DIAGNOSIS  A hematoma can usually be diagnosed based on your medical history and a physical exam. Imaging tests may be needed if your health care provider  suspects a hematoma in deeper tissues or body spaces, such as the abdomen, head, or chest. These tests may include ultrasonography or a CT scan.  TREATMENT  Hematomas usually go away on their own over time. Rarely does the blood need to be drained out of the body. Large hematomas or those that may affect vital organs will sometimes need surgical drainage or monitoring. HOME CARE INSTRUCTIONS   Apply ice to the injured area:   Put ice in a plastic bag.   Place a towel between your skin and the bag.   Leave the ice on for 20 minutes, 2-3 times a day for the first 1 to 2 days.   After the first 2 days, switch to using warm compresses on the hematoma.   Elevate the injured area to help decrease pain and swelling. Wrapping the area with an elastic bandage may also be helpful. Compression helps to reduce swelling and promotes shrinking of the hematoma. Make sure the bandage is not wrapped too tight.   If your hematoma is on a lower extremity and is painful, crutches may be helpful for a couple days.   Only take over-the-counter or prescription medicines as directed by your health care provider. SEEK IMMEDIATE MEDICAL CARE IF:   You have increasing pain, or your pain is not controlled with medicine.   You have a fever.   You have worsening swelling or discoloration.   Your skin over the hematoma breaks or  starts bleeding.   Your hematoma is in your chest or abdomen and you have weakness, shortness of breath, or a change in consciousness.  Your hematoma is on your scalp (caused by a fall or injury) and you have a worsening headache or a change in alertness or consciousness. MAKE SURE YOU:   Understand these instructions.  Will watch your condition.  Will get help right away if you are not doing well or get worse.   This information is not intended to replace advice given to you by your health care provider. Make sure you discuss any questions you have with your health care  provider.   Document Released: 09/26/2003 Document Revised: 10/14/2012 Document Reviewed: 07/22/2012 Elsevier Interactive Patient Education [employer].

## 2015-01-30 NOTE — Telephone Encounter (Signed)
Left message for patient to return call. Patient has OV today at 2:45pm with TP. Sending message to [facility] for follow up at Appointment in case pt does not call back prior to appt.  To Estill Bamberg for follow up

## 2015-01-30 NOTE — Patient Instructions (Signed)
Continue on BIPAP support .  Do not drive if sleepy.  Work on weight loss.  Note to job done.  Take BIPAP chip to DME for download.  follow up Dr. Halford Chessman  In 3 months with download.

## 2015-01-31 ENCOUNTER — Telehealth: Payer: Self-pay | Admitting: Pulmonary Disease

## 2015-01-31 NOTE — Telephone Encounter (Signed)
Will hold in my box until complete, I have checked with Dr Halford Chessman to see if he can come by office 02/01/15  Jonni Sanger with [facility] produced a BiPAP download showing compliance (93.3%). There is a section on the DOT form that needs to addended to reflect this.  The patient states that this has to be turned in before 5pm on 02/02/15.

## 2015-01-31 NOTE — Progress Notes (Signed)
Reviewed and agree with assessment/plan. 

## 2015-02-01 NOTE — Telephone Encounter (Signed)
Awaiting response from Dr. Halford Chessman

## 2015-02-02 NOTE — Telephone Encounter (Signed)
VS please advise. Thanks! 

## 2015-02-03 NOTE — Telephone Encounter (Signed)
Pt aware that forms were addended and faxed back to DOT Copy placed in scan folder as well as in mail addressed to patient.  Nothing further needed.

## 2015-02-09 ENCOUNTER — Telehealth: Payer: Self-pay | Admitting: Pulmonary Disease

## 2015-02-09 NOTE — Telephone Encounter (Signed)
Called and spoke to pt. Pt states he received a letter stating his license is suspended because the information proving he was compliant on CPAP was submitted past the due date. Pt stated we either need to call or send a letter stating the pt is compliant on CPAP and send supporting documents. Advised pt to find out from the letter he received if we need to send a letter or call DOT. Pt stated he will find the letter and call back. Will await call.

## 2015-02-10 ENCOUNTER — Other Ambulatory Visit: Payer: Self-pay | Admitting: *Deleted

## 2015-02-10 NOTE — Telephone Encounter (Signed)
Spoke with pt, needs medical documentation   fax # [phone]  Ph [phone] Address [place] [address]   I advised pt that this info was sent already on 02/03/15.  Pt will call back to the [facility] and will call back if anything further is needed.  Nothing further needed at this time.

## 2015-02-10 NOTE — Telephone Encounter (Signed)
lmtcb for pt.  

## 2015-02-10 NOTE — Telephone Encounter (Signed)
Patient Returned call  [phone]

## 2015-02-10 NOTE — Telephone Encounter (Signed)
Patient returned call, he can be reached at [phone]

## 2015-02-14 ENCOUNTER — Encounter: Payer: Self-pay | Admitting: Pulmonary Disease

## 2015-02-23 ENCOUNTER — Encounter: Payer: Self-pay | Admitting: Gastroenterology

## 2015-02-23 ENCOUNTER — Ambulatory Visit (INDEPENDENT_AMBULATORY_CARE_PROVIDER_SITE_OTHER): Payer: Self-pay | Admitting: Gastroenterology

## 2015-02-23 VITALS — BP 141/86 | HR 77 | Temp 97.6°F | Ht 68.0 in | Wt 255.2 lb

## 2015-02-23 DIAGNOSIS — K649 Unspecified hemorrhoids: Secondary | ICD-10-CM | POA: Insufficient documentation

## 2015-02-23 DIAGNOSIS — K5900 Constipation, unspecified: Secondary | ICD-10-CM

## 2015-02-23 DIAGNOSIS — K219 Gastro-esophageal reflux disease without esophagitis: Secondary | ICD-10-CM

## 2015-02-23 DIAGNOSIS — K648 Other hemorrhoids: Secondary | ICD-10-CM

## 2015-02-23 NOTE — Progress Notes (Addendum)
REVIEWED.   Primary Care Physician: Minerva Ends, MD  Primary Gastroenterologist:  Barney Drain, MD   Chief Complaint  Patient presents with  . Follow-up    HPI: Samuel Irwin is a 54 y.o. male here for follow-up of gastritis and hemorrhoids. Underwent EGD and colonoscopy back in August. Colonoscopy for hematochezia. He was found to have moderate sized external and internal hemorrhoids. EGD for dyspepsia in the setting of aspirin and NSAIDs. Down have moderate NSAID gastritis. Biopsy negative for H pylori.  Weight is up 7 pounds since his last office visit in July.  Has been feeling much better off etoh for past 8 weeks. Drank the other night and recurrent burning in the stomach. PPI on prn. No more vomiting. No dysphagia. No heartburn. BM regular but feels incomplete. Has to strain some. Worse with overeating with Christmas. Increased fruits and vegetable. Used castor oil recently, sometimes once per week and helped. Going to try miralax.   Current Outpatient Prescriptions  Medication Sig Dispense Refill  . acyclovir (ZOVIRAX) 400 MG tablet Take 1 tablet (400 mg total) by mouth 2 (two) times daily. 60 tablet 2  . aspirin EC 81 MG tablet Take 1 tablet (81 mg total) by mouth daily. 30 tablet 0  . hydrochlorothiazide (HYDRODIURIL) 25 MG tablet TAKE 1 TABLET BY MOUTH DAILY. 90 tablet 3  . Multiple Vitamin (MULTIVITAMIN) capsule Take 1 capsule by mouth daily.    Marland Kitchen omeprazole (PRILOSEC) 20 MG capsule Take 1 capsule (20 mg total) by mouth daily. (Patient taking differently: Take 20 mg by mouth daily as needed (acid reflux). ) 30 capsule 3  . tadalafil (CIALIS) 20 MG tablet Take 1 tablet (20 mg total) by mouth daily as needed for erectile dysfunction. 30 tablet 2  . Vitamin D, Ergocalciferol, (DRISDOL) 50000 UNITS CAPS capsule Take 1 capsule (50,000 Units total) by mouth every 7 (seven) days. 12 capsule 0   No current facility-administered medications for this visit.     Allergies as of 02/23/2015  . (No Known Allergies)    ROS:  General: Negative for anorexia, weight loss, fever, chills, fatigue, weakness. ENT: Negative for hoarseness, difficulty swallowing , nasal congestion. CV: Negative for chest pain, angina, palpitations, dyspnea on exertion, peripheral edema.  Respiratory: Negative for dyspnea at rest, dyspnea on exertion, cough, sputum, wheezing.  GI: See history of present illness. GU:  Negative for dysuria, hematuria, urinary incontinence, urinary frequency, nocturnal urination.  Endo: Negative for unusual weight change.    Physical Examination:   BP 141/86 mmHg  Pulse 77  Temp(Src) 97.6 F (36.4 C) (Oral)  Ht 5\' 8"  (1.727 m)  Wt 255 lb 3.2 oz (115.758 kg)  BMI 38.81 kg/m2  General: Well-nourished, well-developed in no acute distress.  Eyes: No icterus. Mouth: Oropharyngeal mucosa moist and pink , no lesions erythema or exudate. Lungs: Clear to auscultation bilaterally.  Heart: Regular rate and rhythm, no murmurs rubs or gallops.  Abdomen: Bowel sounds are normal, nontender, nondistended, no hepatosplenomegaly or masses, no abdominal bruits or hernia , no rebound or guarding.   Extremities: No lower extremity edema. No clubbing or deformities. Neuro: Alert and oriented x 4   Skin: Warm and dry, no jaundice.   Psych: Alert and cooperative, normal mood and affect.  Labs:  Lab Results  Component Value Date   CREATININE 1.08 10/14/2014   BUN 16 10/14/2014   NA 139 10/14/2014   K 4.0 10/14/2014   CL 105 10/14/2014   CO2 27  10/14/2014   Lab Results  Component Value Date   ALT 20 10/14/2014   AST 16 10/14/2014   ALKPHOS 58 10/14/2014   BILITOT 0.8 10/14/2014   Lab Results  Component Value Date   WBC 8.2 01/06/2015   HGB 15.6 01/06/2015   HCT 45.9 01/06/2015   MCV 89.6 01/06/2015   PLT 310 01/06/2015    Imaging Studies: No results found.

## 2015-02-23 NOTE — Patient Instructions (Signed)
1. Continue omeprazole as needed for heartburn, stomach burning. 2. Continue to cut back on alcohol use. Daily or frequent heavy use can affect the lining of your stomach and colon as well as increase risk of liver and heart disease.  3. Try to lose 20 pounds over the next 3 months. 4. Miralax one capful daily. Call if you continue to have problems with bloating or your bowels.  5. Return to the office in one year or sooner if needed.

## 2015-02-23 NOTE — Assessment & Plan Note (Signed)
Straining with BMs, external/internal hemorrhoids. Feels full/bloated. Trial of miralax daily. Call if ongoing symptoms. Continue with dietary fiber and plenty of water.

## 2015-02-23 NOTE — Assessment & Plan Note (Signed)
States he heartburn and abdominal pain resolved with stopping etoh. Not taking PPI regularly. Feels well. Trying not to overeat. Continue PPI prn. Recommend 20 pound weight loss over the next 3 months. Avoid etoh. Return to the office in one year or sooner if needed.

## 2015-02-23 NOTE — Progress Notes (Signed)
cc'ed to pcp °

## 2015-03-03 ENCOUNTER — Ambulatory Visit: Payer: Self-pay | Admitting: Family Medicine

## 2015-03-17 ENCOUNTER — Ambulatory Visit: Payer: Self-pay

## 2015-03-28 MED FILL — $Cialis 20mg tablet: 20 | 14 days supply | Qty: 5 | Fill #2

## 2015-03-28 MED FILL — HYDROCHLOROTHIAZIDE 25 MG T: 25 | 30 days supply | Qty: 30 | Fill #9

## 2015-04-04 ENCOUNTER — Ambulatory Visit (HOSPITAL_COMMUNITY)
Admission: RE | Admit: 2015-04-04 | Discharge: 2015-04-04 | Disposition: A | Discharge status: Home / Self Care | Payer: No Typology Code available for payment source | Source: Ambulatory Visit | Attending: Family Medicine | Admitting: Family Medicine

## 2015-04-04 ENCOUNTER — Ambulatory Visit: Payer: Self-pay | Attending: Family Medicine | Admitting: Family Medicine

## 2015-04-04 ENCOUNTER — Encounter: Payer: Self-pay | Admitting: Family Medicine

## 2015-04-04 VITALS — BP 130/72 | HR 82 | Temp 98.4°F | Resp 16 | Ht 68.0 in | Wt 248.0 lb

## 2015-04-04 DIAGNOSIS — Z7982 Long term (current) use of aspirin: Secondary | ICD-10-CM | POA: Insufficient documentation

## 2015-04-04 DIAGNOSIS — Z605 Target of (perceived) adverse discrimination and persecution: Secondary | ICD-10-CM | POA: Insufficient documentation

## 2015-04-04 DIAGNOSIS — T1490 Injury, unspecified: Secondary | ICD-10-CM

## 2015-04-04 DIAGNOSIS — Z87891 Personal history of nicotine dependence: Secondary | ICD-10-CM | POA: Insufficient documentation

## 2015-04-04 DIAGNOSIS — T148XXA Other injury of unspecified body region, initial encounter: Secondary | ICD-10-CM

## 2015-04-04 DIAGNOSIS — M25552 Pain in left hip: Secondary | ICD-10-CM

## 2015-04-04 DIAGNOSIS — Z79899 Other long term (current) drug therapy: Secondary | ICD-10-CM | POA: Insufficient documentation

## 2015-04-04 DIAGNOSIS — G5602 Carpal tunnel syndrome, left upper limb: Secondary | ICD-10-CM | POA: Insufficient documentation

## 2015-04-04 DIAGNOSIS — T148 Other injury of unspecified body region: Secondary | ICD-10-CM | POA: Insufficient documentation

## 2015-04-04 DIAGNOSIS — M25532 Pain in left wrist: Secondary | ICD-10-CM | POA: Insufficient documentation

## 2015-04-04 DIAGNOSIS — K0889 Other specified disorders of teeth and supporting structures: Secondary | ICD-10-CM

## 2015-04-04 DIAGNOSIS — M1612 Unilateral primary osteoarthritis, left hip: Secondary | ICD-10-CM | POA: Insufficient documentation

## 2015-04-04 NOTE — Progress Notes (Signed)
F/U bruise on leg  C/C  Pain and tingling on lt forearm with movement  Pain scale # 0 No tobacco user  No suicidal thoughts in the past two weeks   Dental referral

## 2015-04-04 NOTE — Patient Instructions (Addendum)
Samuel Irwin was seen today for arm pain.  Diagnoses and all orders for this visit:  Carpal tunnel syndrome of left wrist -     AMB referral to sports medicine  Lateral pain of left hip -     DG HIP UNILAT WITH PELVIS 2-3 VIEWS LEFT; Future  Pain, dental -     Ambulatory referral to Dentistry   I recommend that you incorporate flexibility training with yoga at the [facility]  You will be called with sports medicine referral and dental referral  Please go for hip x-ray   F/u in 3 months for HTN  Dr. Adrian Blackwater
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
## 2015-04-05 ENCOUNTER — Encounter: Payer: Self-pay | Admitting: Family Medicine

## 2015-04-05 DIAGNOSIS — M25552 Pain in left hip: Secondary | ICD-10-CM | POA: Insufficient documentation

## 2015-04-05 DIAGNOSIS — Z605 Target of (perceived) adverse discrimination and persecution: Secondary | ICD-10-CM | POA: Insufficient documentation

## 2015-04-05 NOTE — Assessment & Plan Note (Signed)
L lateral hip pain, mild  Advised focus on stretching Prn NSAID
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
## 2015-04-05 NOTE — Assessment & Plan Note (Signed)
Resolved with area of residual numbness

## 2015-04-05 NOTE — Assessment & Plan Note (Signed)
Carpal tunnel suspected in L wrist  Continue wearing brace Sports medicine referral for steroid injection

## 2015-04-17 ENCOUNTER — Telehealth: Payer: Self-pay | Admitting: Family Medicine

## 2015-04-17 DIAGNOSIS — M25552 Pain in left hip: Secondary | ICD-10-CM

## 2015-04-17 DIAGNOSIS — M25561 Pain in right knee: Principal | ICD-10-CM

## 2015-04-17 DIAGNOSIS — M25562 Pain in left knee: Principal | ICD-10-CM

## 2015-04-17 DIAGNOSIS — G8929 Other chronic pain: Secondary | ICD-10-CM

## 2015-04-17 MED FILL — ACYCLOVIR 400 MG TABLET: 400 | 30 days supply | Qty: 60 | Fill #2

## 2015-04-17 NOTE — Telephone Encounter (Signed)
Pt. Called requesting to know his x-ray results. Pt. Also needs an x-ray done for his right knee. Please f/u with pt.

## 2015-04-18 ENCOUNTER — Ambulatory Visit (INDEPENDENT_AMBULATORY_CARE_PROVIDER_SITE_OTHER): Payer: No Typology Code available for payment source | Admitting: Family Medicine

## 2015-04-18 ENCOUNTER — Encounter: Payer: Self-pay | Admitting: Family Medicine

## 2015-04-18 ENCOUNTER — Ambulatory Visit (HOSPITAL_COMMUNITY)
Admission: RE | Admit: 2015-04-18 | Discharge: 2015-04-18 | Disposition: A | Discharge status: Home / Self Care | Payer: No Typology Code available for payment source | Source: Ambulatory Visit | Attending: Family Medicine | Admitting: Family Medicine

## 2015-04-18 VITALS — BP 134/88 | HR 83 | Ht 68.0 in | Wt 248.0 lb

## 2015-04-18 DIAGNOSIS — M25562 Pain in left knee: Secondary | ICD-10-CM | POA: Insufficient documentation

## 2015-04-18 DIAGNOSIS — M25552 Pain in left hip: Secondary | ICD-10-CM

## 2015-04-18 DIAGNOSIS — M7712 Lateral epicondylitis, left elbow: Secondary | ICD-10-CM

## 2015-04-18 DIAGNOSIS — M25561 Pain in right knee: Secondary | ICD-10-CM | POA: Insufficient documentation

## 2015-04-18 DIAGNOSIS — M1711 Unilateral primary osteoarthritis, right knee: Secondary | ICD-10-CM | POA: Insufficient documentation

## 2015-04-18 DIAGNOSIS — G5602 Carpal tunnel syndrome, left upper limb: Secondary | ICD-10-CM

## 2015-04-18 DIAGNOSIS — M7989 Other specified soft tissue disorders: Secondary | ICD-10-CM | POA: Insufficient documentation

## 2015-04-18 DIAGNOSIS — M25761 Osteophyte, right knee: Secondary | ICD-10-CM | POA: Insufficient documentation

## 2015-04-18 DIAGNOSIS — G8929 Other chronic pain: Secondary | ICD-10-CM | POA: Insufficient documentation

## 2015-04-18 DIAGNOSIS — M25762 Osteophyte, left knee: Secondary | ICD-10-CM | POA: Insufficient documentation

## 2015-04-18 MED ORDER — TRAMADOL HCL 50 MG PO TABS: 50.0000 mg | ORAL_TABLET | Freq: Three times a day (TID) | ORAL | Status: DC | PRN

## 2015-04-18 MED ORDER — METHYLPREDNISOLONE ACETATE 40 MG/ML IJ SUSP
40.0000 mg | Freq: Once | INTRAMUSCULAR | Status: AC
  Administered 2015-04-18: 40 mg via INTRA_ARTICULAR

## 2015-04-18 MED FILL — traMADol HCL 50 MG TABS: 50 | 20 days supply | Qty: 60 | Fill #0

## 2015-04-18 NOTE — Telephone Encounter (Signed)
Bilateral standing knee x-rays ordered Tramadol for L hip pain ordered

## 2015-04-18 NOTE — Telephone Encounter (Signed)
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 If pain worsens or you develop decreased range of motion in the L hip, surgery to remove the heterotopic ossification may be needed.    Pt stated rt knee  pain requesting Xray Pt ask what to take for hip pain  Pt advised to take OTC arthritis pain medication if needed for hip pain

## 2015-04-18 NOTE — Telephone Encounter (Signed)
Pt aware Rx at front office  Xray order pt aware

## 2015-04-21 ENCOUNTER — Encounter: Payer: Self-pay | Admitting: Clinical

## 2015-04-21 NOTE — Progress Notes (Signed)
Depression screen [facility] 2/9 04/18/2015 01/30/2015 01/06/2015  Decreased Interest 0 0 0  Down, Depressed, Hopeless 0 0 0  PHQ - 2 Score 0 0 0    04-04-15: PHQ9-6 (2,2,0,0,0,0,1,1,0)/ GAD7-12(2,2,2,2,0,1,3)

## 2015-04-24 ENCOUNTER — Telehealth: Payer: Self-pay | Admitting: *Deleted

## 2015-04-24 NOTE — Telephone Encounter (Signed)
Pt. Returned call. Please f/u with pt. °

## 2015-04-24 NOTE — Telephone Encounter (Signed)
LVM to return call.

## 2015-04-24 NOTE — Progress Notes (Signed)
GARNETT GOFF - 55 y.o. male MRN [id number]  Date of birth: 1960/11/02  CC: Left arm pain, left hip pain, right knee pain  SUBJECTIVE:   HPI  Mr. Dodaro is a pleasant although anxious 55 year old male here with multiple musculoskeletal issues. He has had persistent ongoing left lateral hip pain. He has a history of a left femur fracture in 1986 that was revised and had a rod removed for unknown reasons. Over the last 6 weeks he has noticed increasing pain with leg press after doing many wraps. He has not had many issues with this left hip in the past. He denies any trauma. He has not had any procedures in this area since that time.  He has had left posterior wrist pain for the last several months, which has been worsening. He has had carpal tunnel surgery in the right reports having a diagnosis of carpal tunnel on the left in the past. He does not know of any exacerbating factors. He does not shake out his hand at night. His hand does feel stiff. He denies any significant swelling. He denies any weakness in either hand. He has not had any recent injections. He denies any significant numbness.  We did not discuss his right-sided knee pain in detail but he has had worsening knee pain over the last several months and had x-rays ordered by his PCP which she is due to get the near future. He denies any significant swelling, locking, catching, giving way.  ROS:     As above, denies any fevers chills or night sweats. No significant weight loss. No loss of appetite.  HISTORY: Past Medical, Surgical, Social, and Family History Reviewed & Updated per EMR.  Pertinent Historical Findings include: GERD, OSA, HTN, Erectile dysfunction, prescribed cialis, history of left femur surgery and revision on the left side over 20 years ago. Dr. Karren Cobble performed today carpal tunnel release in 2013 on the right side. He also reports having a diagnosis of carpal tunnel on the left but did not have surgery at that  time.  Data review: 04/04/2015 hip films shows a relatively large heterotopic soft tissue calcifications superior to the greater tuberosity of the left femur.  OBJECTIVE: BP 134/88 mmHg  Pulse 83  Ht 5\' 8"  (1.727 m)  Wt 248 lb (112.492 kg)  BMI 37.72 kg/m2  Physical Exam  Calm, no acute distress Nonlabored breathing  Neck: Negative spurling's Full neck range of motion Grip strength and sensation normal in bilateral hands Strength good C4 to T1 distribution No sensory change to C4 to T1 Reflexes normal  No visible deformity of the left upper extremity. Full passive and active range of motion of the left elbow, wrist, digits. No consistent sensory abnormality of the left upper extremity. Negative Durkan, Phalen, Tinel. Tender throughout the dorsal aspect of the distal forearm and over the lateral epicondyle. Significant soreness with resisted extension of the wrist and especially the third digit.  Hip: ROM IR: 80 Deg, ER: 80 Deg, Flexion: 120 Deg, Extension: 100 Deg, Abduction: 45 Deg, Adduction: 45 Deg Strength IR: 5/5, ER: 5/5, Flexion: 5/5, Extension: 5/5, Abduction: 5/5, Adduction: 5/5 Tender over the greater trochanter on the left   MEDICATIONS, LABS & OTHER ORDERS: Previous Medications   ACYCLOVIR (ZOVIRAX) 400 MG TABLET    Take 1 tablet (400 mg total) by mouth 2 (two) times daily.   ASPIRIN EC 81 MG TABLET    Take 1 tablet (81 mg total) by mouth daily.   HYDROCHLOROTHIAZIDE (HYDRODIURIL)  25 MG TABLET    TAKE 1 TABLET BY MOUTH DAILY.   MULTIPLE VITAMIN (MULTIVITAMIN) CAPSULE    Take 1 capsule by mouth daily.   OMEPRAZOLE (PRILOSEC) 20 MG CAPSULE    Take 1 capsule (20 mg total) by mouth daily.   TADALAFIL (CIALIS) 20 MG TABLET    Take 1 tablet (20 mg total) by mouth daily as needed for erectile dysfunction.   TRAMADOL (ULTRAM) 50 MG TABLET    Take 1 tablet (50 mg total) by mouth every 8 (eight) hours as needed.   VITAMIN D, ERGOCALCIFEROL, (DRISDOL) 50000 UNITS CAPS  CAPSULE    Take 1 capsule (50,000 Units total) by mouth every 7 (seven) days.   Modified Medications   No medications on file   New Prescriptions   No medications on file   Discontinued Medications   No medications on file   Orders Placed This Encounter  Procedures  . 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 This is been recent although his surgery was many years ago. We discussed the option of steroid injection into this region, which is listed below. We may consider an ultrasound guided injection if this does not provide relief. Anti-inflammatories are also an option. He should ease off of any exercises such as leg press or anything that requires deep hip flexion as this may irritate the area. We discussed the possibility of surgery although I don't know that this would be feasible considering the chronicity of the soft tissue changes.  Left upper extremity pain consistent with lateral epicondylitis: We discussed this diagnosis versus carpal tunnel. He does not have findings consistent with carpal tunnel at this time. We discussed tennis elbow rehabilitation exercises and provided him with a counterforce brace. He will follow-up in 3 weeks. Again NSAIDs are reasonable. He is on Cialis so nitroglycerin may not be an option for him.  Right knee pain: No significant pathology based on history although we did not have a chance to discuss this in depth. We will discuss this at the follow-up appointment.  Consent obtained and verified. Sterile betadine prep. Furthur cleansed with alcohol. Topical analgesic spray: Ethyl chloride. Joint: Left lateral hip over the heterotopic soft tissue calcifications seen on x-ray Approached in typical fashion with: With palpation over the left lateral bony prominence of the greater trochanter and soft tissue calcification identified on  imaging  Completed without difficulty Meds: 2: 4 mL with methyl Pred and 1% lidocaine respectively Needle:1.5 inch 25-gauge Aftercare instructions and Red flags advised.

## 2015-04-24 NOTE — Telephone Encounter (Signed)
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

## 2015-04-26 ENCOUNTER — Telehealth: Payer: Self-pay | Admitting: Family Medicine

## 2015-04-26 MED FILL — HYDROCHLOROTHIAZIDE 25 MG T: 25 | 30 days supply | Qty: 30 | Fill #10

## 2015-04-26 NOTE — Telephone Encounter (Signed)
Pt. Returned call. Please f/u with pt. °

## 2015-04-28 ENCOUNTER — Emergency Department (HOSPITAL_COMMUNITY): Payer: No Typology Code available for payment source

## 2015-04-28 ENCOUNTER — Encounter (HOSPITAL_COMMUNITY): Payer: Self-pay | Admitting: Emergency Medicine

## 2015-04-28 ENCOUNTER — Emergency Department (HOSPITAL_COMMUNITY)
Admission: EM | Admit: 2015-04-28 | Discharge: 2015-04-28 | Disposition: A | Discharge status: Home / Self Care | Payer: No Typology Code available for payment source | Attending: Emergency Medicine | Admitting: Emergency Medicine

## 2015-04-28 DIAGNOSIS — K219 Gastro-esophageal reflux disease without esophagitis: Secondary | ICD-10-CM | POA: Insufficient documentation

## 2015-04-28 DIAGNOSIS — Z7982 Long term (current) use of aspirin: Secondary | ICD-10-CM | POA: Insufficient documentation

## 2015-04-28 DIAGNOSIS — Z87891 Personal history of nicotine dependence: Secondary | ICD-10-CM | POA: Insufficient documentation

## 2015-04-28 DIAGNOSIS — R0789 Other chest pain: Secondary | ICD-10-CM | POA: Insufficient documentation

## 2015-04-28 DIAGNOSIS — I10 Essential (primary) hypertension: Secondary | ICD-10-CM | POA: Insufficient documentation

## 2015-04-28 DIAGNOSIS — R011 Cardiac murmur, unspecified: Secondary | ICD-10-CM | POA: Insufficient documentation

## 2015-04-28 DIAGNOSIS — Z8619 Personal history of other infectious and parasitic diseases: Secondary | ICD-10-CM | POA: Insufficient documentation

## 2015-04-28 DIAGNOSIS — Z79899 Other long term (current) drug therapy: Secondary | ICD-10-CM | POA: Insufficient documentation

## 2015-04-28 LAB — CBC
HCT: 47.2 % (ref 39.0–52.0)
Hemoglobin: 16.5 g/dL (ref 13.0–17.0)
MCH: 31.3 pg (ref 26.0–34.0)
MCHC: 35 g/dL (ref 30.0–36.0)
MCV: 89.4 fL (ref 78.0–100.0)
PLATELETS: 284 10*3/uL (ref 150–400)
RBC: 5.28 MIL/uL (ref 4.22–5.81)
RDW: 15.2 % (ref 11.5–15.5)
WBC: 7.8 10*3/uL (ref 4.0–10.5)

## 2015-04-28 LAB — BASIC METABOLIC PANEL
Anion gap: 11 (ref 5–15)
BUN: 14 mg/dL (ref 6–20)
CALCIUM: 9.9 mg/dL (ref 8.9–10.3)
CO2: 25 mmol/L (ref 22–32)
CREATININE: 1.04 mg/dL (ref 0.61–1.24)
Chloride: 105 mmol/L (ref 101–111)
GFR calc non Af Amer: >60 mL/min (ref >60)
Glucose, Bld: 118 mg/dL — ABNORMAL HIGH (ref 65–99)
Potassium: 3.9 mmol/L (ref 3.5–5.1)
SODIUM: 141 mmol/L (ref 135–145)

## 2015-04-28 LAB — I-STAT TROPONIN, ED: TROPONIN I, POC: 0 ng/mL (ref 0.00–0.08)

## 2015-04-28 NOTE — Discharge Instructions (Signed)
Chest Wall Pain You may take the pain medication prescribed by your family doctor for your chest pain. Chest wall pain is pain in or around the bones and muscles of your chest. Sometimes, an injury causes this pain. Sometimes, the cause may not be known. This pain may take several weeks or longer to get better. HOME CARE INSTRUCTIONS  Pay attention to any changes in your symptoms. Take these actions to help with your pain:   Rest as told by your health care provider.   Avoid activities that cause pain. These include any activities that use your chest muscles or your abdominal and side muscles to lift heavy items.   If directed, apply ice to the painful area:  Put ice in a plastic bag.  Place a towel between your skin and the bag.  Leave the ice on for 20 minutes, 2-3 times per day.  Take over-the-counter and prescription medicines only as told by your health care provider.  Do not use tobacco products, including cigarettes, chewing tobacco, and e-cigarettes. If you need help quitting, ask your health care provider.  Keep all follow-up visits as told by your health care provider. This is important. SEEK MEDICAL CARE IF:  You have a fever.  Your chest pain becomes worse.  You have new symptoms. SEEK IMMEDIATE MEDICAL CARE IF:  You have nausea or vomiting.  You feel sweaty or light-headed.  You have a cough with phlegm (sputum) or you cough up blood.  You develop shortness of breath.   This information is not intended to replace advice given to you by your health care provider. Make sure you discuss any questions you have with your health care provider.   Document Released: 02/11/2005 Document Revised: 11/02/2014 Document Reviewed: 05/09/2014 Elsevier Interactive Patient Education [employer].

## 2015-04-28 NOTE — ED Provider Notes (Signed)
CSN: [id number]     Arrival date & time 04/28/15  1323 History   First MD Initiated Contact with Patient 04/28/15 1811     Chief Complaint  Patient presents with  . Chest Pain     (Consider location/radiation/quality/duration/timing/severity/associated sxs/prior Treatment) HPI Patient has chest pain for approximately 2-3 weeks that has been coming and going. It has a sharp quality and he has predominantly noted it on his left upper chest but occasionally on the right side of the mid chest as well. Lasts for a few minutes at a time. Patient denies associated symptoms. He does report approximately 2 weeks ago he did a new workout regimen that includes using a roller on a bar for doing a modified pushup type maneuver. He reports he did a number of reps of this. He reports he works out fairly extensively and does not get chest pain with exercising. He is able to work on the elliptical machine for an hour without chest discomfort or undue shortness of breath. Patient also had rice symptoms approximately 3 weeks ago. Was taking TheraFlu and other over-the-counter medications to alleviate nasal and sinus symptoms with a lot of sneezing. He does not have fever or productive cough. No lower extremity pain or swelling. Past Medical History  Diagnosis Date  . GERD (gastroesophageal reflux disease)   . Umbilical hernia   . Constipation   . Cough   . Wheezing   . Weakness   . Generalized headaches   . Heart murmur   . H/O hiatal hernia   . Herpes   . Hypertension    Past Surgical History  Procedure Laterality Date  . Femur surgery      left  . Toe surgery      dislocated lt foot  . Carpal tunnel release      R hand  . Umbilical hernia repair N/A 04/07/2012    Procedure: HERNIA REPAIR UMBILICAL ADULT;  Surgeon: Madilyn Hook, DO;  Location: [facility];  Service: General;  Laterality: N/A;  open umbilical hernia repair with mesh  . Insertion of mesh N/A 04/07/2012    Procedure: INSERTION OF MESH;   Surgeon: Madilyn Hook, DO;  Location: [facility];  Service: General;  Laterality: N/A;  . Hernia repair  04/07/12    umb hernia repair  . Knee arthroscopy      right  . Colonoscopy with propofol N/A 10/18/2014    [id number] size external hemorrhoids/left colon is redundant  . Esophagogastroduodenoscopy (egd) with propofol N/A 10/18/2014    SLF: epigastric pain due to moderate NSAID gastritis  . Biopsy  10/18/2014    Procedure: BIOPSY (Gastric);  Surgeon: Danie Binder, MD;  Location: [facility];  Service: Endoscopy;;   Family History  Problem Relation Age of Onset  . Cancer Father     lung  . Colon cancer Neg Hx   . Hypertension Mother    Social History  Substance Use Topics  . Smoking status: Former Smoker -- 0.25 packs/day for 15 years    Types: Cigarettes    Quit date: 01/04/2015  . Smokeless tobacco: None     Comment: occas still smokes when he drinks  . Alcohol Use: 0.0 oz/week    0 Standard drinks or equivalent per week     Comment: social drinks, drinks 80-120 ounces of beer three times weekly (09/21/14)    Review of Systems 10 Systems reviewed and are negative for acute change except as noted in the HPI.    Allergies  Review of patient's allergies indicates no known allergies.  Home Medications   Prior to Admission medications   Medication Sig Start Date End Date Taking? Authorizing Provider  acyclovir (ZOVIRAX) 400 MG tablet Take 1 tablet (400 mg total) by mouth 2 (two) times daily. 12/29/14   Tresa Garter, MD  aspirin EC 81 MG tablet Take 1 tablet (81 mg total) by mouth daily. 01/06/15   Josalyn Funches, MD  hydrochlorothiazide (HYDRODIURIL) 25 MG tablet TAKE 1 TABLET BY MOUTH DAILY. 06/14/14   Lorayne Marek, MD  Multiple Vitamin (MULTIVITAMIN) capsule Take 1 capsule by mouth daily.    Historical Provider, MD  omeprazole (PRILOSEC) 20 MG capsule Take 1 capsule (20 mg total) by mouth daily. Patient taking differently: Take 20 mg by mouth daily as needed (acid  reflux).  08/31/14   Lorayne Marek, MD  tadalafil (CIALIS) 20 MG tablet Take 1 tablet (20 mg total) by mouth daily as needed for erectile dysfunction. 06/06/14   Lorayne Marek, MD  traMADol (ULTRAM) 50 MG tablet Take 1 tablet (50 mg total) by mouth every 8 (eight) hours as needed. 04/18/15   Boykin Nearing, MD  Vitamin D, Ergocalciferol, (DRISDOL) 50000 UNITS CAPS capsule Take 1 capsule (50,000 Units total) by mouth every 7 (seven) days. 09/01/14   Deepak Advani, MD   BP 126/90 mmHg  Pulse 76  Temp(Src) 98.2 F (36.8 C) (Oral)  Resp 14  SpO2 99% Physical Exam  Constitutional: He is oriented to person, place, and time. He appears well-developed and well-nourished.  HENT:  Head: Normocephalic and atraumatic.  Eyes: EOM are normal. Pupils are equal, round, and reactive to light.  Neck: Neck supple.  Cardiovascular: Normal rate, regular rhythm, normal heart sounds and intact distal pulses.   Pulmonary/Chest: Effort normal and breath sounds normal.  Abdominal: Soft. Bowel sounds are normal. He exhibits no distension. There is no tenderness.  Musculoskeletal: Normal range of motion. He exhibits no edema or tenderness.  Patient wears a left removable wrist splint for carpal tunnel. He has not had any surgeries or formal immobilization. No edema of the arm. Lower extremities have no calf tenderness or peripheral edema.  Neurological: He is alert and oriented to person, place, and time. He has normal strength. Coordination normal. GCS eye subscore is 4. GCS verbal subscore is 5. GCS motor subscore is 6.  Skin: Skin is warm, dry and intact.  Psychiatric: He has a normal mood and affect.    ED Course  Procedures (including critical care time) Labs Review Labs Reviewed  BASIC METABOLIC PANEL - Abnormal; Notable for the following:    Glucose, Bld 118 (*)    All other components within normal limits  CBC  I-STAT TROPOININ, ED    Imaging Review Dg Chest 2 View  04/28/2015  CLINICAL DATA:  Chest  pain, wheezing and cough. EXAM: CHEST - 2 VIEW COMPARISON:  04/30/2014 FINDINGS: The heart size and mediastinal contours are within normal limits. Mild bibasilar atelectasis present. There is no evidence of pulmonary edema, consolidation, pneumothorax, nodule or pleural fluid. The visualized skeletal structures are unremarkable. IMPRESSION: Bibasilar atelectasis.  No active disease. Electronically Signed   By: Aletta Edouard M.D.   On: 04/28/2015 13:55   I have personally reviewed and evaluated these images and lab results as part of my medical decision-making.   EKG Interpretation   Date/Time:  Friday April 28 2015 13:29:27 EST Ventricular Rate:  85 PR Interval:  134 QRS Duration: 76 QT Interval:  340 QTC Calculation:  404 R Axis:   62 Text Interpretation:  Normal sinus rhythm Normal ECG agree Confirmed by  Johnney Killian, MD, Jeannie Done [id number]) on 04/28/2015 6:14:27 PM      MDM   Final diagnoses:  Chest wall pain   This time, patient's chest pain appears most likely due to musculoskeletal etiology with a significantly exertional new exercise maneuver that he did 2 weeks ago. He also has had URI which would be consistent with an etiology for pleurisy. Patient's general appearance is good. He has no elevation troponin and normal EKG. He describes having had a stress test likely within the past year or so with no abnormality identified. At this time, this appears low probability be cardiac ischemic in etiology. Patient has follow-up with his physician next week for recheck. He reports he has a lot of Ultram to take for pain at home that his physician had prescribed for the carpal tunnel. He reports he hasn't been taking it but he could if he needed it pain control.    Charlesetta Shanks, MD 04/28/15 [phone]

## 2015-04-28 NOTE — ED Notes (Signed)
Pt verbalized understanding of d/c instructions and follow-up care. No further questions/concerns, VSS, ambulatory w/ steady gait (refused wheelchair) 

## 2015-04-28 NOTE — ED Notes (Signed)
Pt sts intermittent CP on left side x 3 weeks; pt sts recent head cold with congestion

## 2015-05-01 ENCOUNTER — Other Ambulatory Visit: Payer: Self-pay | Admitting: *Deleted

## 2015-05-01 MED ORDER — TADALAFIL 20 MG PO TABS: 20.0000 mg | ORAL_TABLET | Freq: Every day | ORAL | Status: DC | PRN

## 2015-05-03 NOTE — Telephone Encounter (Signed)
Knee Xray results was given to pt

## 2015-05-04 ENCOUNTER — Ambulatory Visit: Payer: Self-pay | Attending: Family Medicine | Admitting: Family Medicine

## 2015-05-04 ENCOUNTER — Encounter: Payer: Self-pay | Admitting: Family Medicine

## 2015-05-04 VITALS — BP 125/87 | HR 85 | Temp 98.3°F | Resp 16 | Ht 67.0 in | Wt 242.0 lb

## 2015-05-04 DIAGNOSIS — Z87891 Personal history of nicotine dependence: Secondary | ICD-10-CM | POA: Insufficient documentation

## 2015-05-04 DIAGNOSIS — N400 Enlarged prostate without lower urinary tract symptoms: Secondary | ICD-10-CM | POA: Insufficient documentation

## 2015-05-04 DIAGNOSIS — J302 Other seasonal allergic rhinitis: Secondary | ICD-10-CM | POA: Insufficient documentation

## 2015-05-04 DIAGNOSIS — Z7951 Long term (current) use of inhaled steroids: Secondary | ICD-10-CM | POA: Insufficient documentation

## 2015-05-04 DIAGNOSIS — Z7982 Long term (current) use of aspirin: Secondary | ICD-10-CM | POA: Insufficient documentation

## 2015-05-04 DIAGNOSIS — R079 Chest pain, unspecified: Secondary | ICD-10-CM | POA: Insufficient documentation

## 2015-05-04 DIAGNOSIS — R091 Pleurisy: Secondary | ICD-10-CM | POA: Insufficient documentation

## 2015-05-04 DIAGNOSIS — Z0000 Encounter for general adult medical examination without abnormal findings: Secondary | ICD-10-CM

## 2015-05-04 DIAGNOSIS — R51 Headache: Secondary | ICD-10-CM | POA: Insufficient documentation

## 2015-05-04 DIAGNOSIS — Z79899 Other long term (current) drug therapy: Secondary | ICD-10-CM | POA: Insufficient documentation

## 2015-05-04 LAB — HEMOCCULT GUIAC POC 1CARD (OFFICE): Fecal Occult Blood, POC: NEGATIVE

## 2015-05-04 MED ORDER — FLUTICASONE PROPIONATE 50 MCG/ACT NA SUSP: 2.0000 | Freq: Every day | NASAL | Status: DC

## 2015-05-04 MED ORDER — TAMSULOSIN HCL 0.4 MG PO CAPS: 0.4000 mg | ORAL_CAPSULE | Freq: Every day | ORAL | Status: DC

## 2015-05-04 NOTE — Patient Instructions (Signed)
Samuel Irwin was seen today for chest pain and hospitalization follow-up.  Diagnoses and all orders for this visit:  Healthcare maintenance -     Hemoccult - 1 Card (office)  BPH (benign prostatic hyperplasia) -     tamsulosin (FLOMAX) 0.4 MG CAPS capsule; Take 1 capsule (0.4 mg total) by mouth daily.  Other seasonal allergic rhinitis -     fluticasone (FLONASE) 50 MCG/ACT nasal spray; Place 2 sprays into both nostrils daily.   if the current dose of flomax is not enough it can be increased to 0.8 mg   F/u in 6 weeks for BPH,  Dr. Adrian Blackwater    Benign Prostatic Hyperplasia An enlarged prostate (benign prostatic hyperplasia) is common in older men. You may experience the following:  Weak urine stream.  Dribbling.  Feeling like the bladder has not emptied completely.  Difficulty starting urination.  Getting up frequently at night to urinate.  Urinating more frequently during the day. HOME CARE INSTRUCTIONS  Monitor your prostatic hyperplasia for any changes. The following actions may help to alleviate any discomfort you are experiencing:  Give yourself time when you urinate.  Stay away from alcohol.  Avoid beverages containing caffeine, such as coffee, tea, and colas, because they can make the problem worse.  Avoid decongestants, antihistamines, and some prescription medicines that can make the problem worse.  Follow up with your health care provider for further treatment as recommended. SEEK MEDICAL CARE IF:  You are experiencing progressive difficulty voiding.  Your urine stream is progressively getting narrower.  You are awaking from sleep with the urge to void more frequently.  You are constantly feeling the need to void.  You experience loss of urine, especially in small amounts. SEEK IMMEDIATE MEDICAL CARE IF:   You develop increased pain with urination or are unable to urinate.  You develop severe abdominal pain, vomiting, a high fever, or fainting.  You  develop back pain or blood in your urine. MAKE SURE YOU:   Understand these instructions.  Will watch your condition.  Will get help right away if you are not doing well or get worse.   This information is not intended to replace advice given to you by your health care provider. Make sure you discuss any questions you have with your health care provider.   Document Released: 02/11/2005 Document Revised: 03/04/2014 Document Reviewed: 07/14/2012 Elsevier Interactive Patient Education [employer].

## 2015-05-04 NOTE — Assessment & Plan Note (Signed)
A; BPH P: flomax 0.4 mg daily

## 2015-05-04 NOTE — Progress Notes (Signed)
Subjective:  Patient ID: Samuel Irwin, male    DOB: 12/14/1960  Age: 55 y.o. MRN: [id number]  CC: Chest Pain   HPI Samuel Irwin presents for    1. ED f/u CP: he was seen in ED on 04/28/2014 with CP. He had normal CXR, EKG, trop. He had URI symptoms. Was diagnosed with pleurisy. No fever or chills. No cough. No recurrent CP.   2. Headaches: started yesterday. Comes and goes with congestion. No fever or chills. He has taken flonase in the past.  3. Difficulty passing urine: this is a longstanding problem for at least 6 months. Had hesitancy and feeling of incomplete bladder emptying. No hematuria or dysuria.    Social History  Substance Use Topics  . Smoking status: Former Smoker -- 0.25 packs/day for 15 years    Types: Cigarettes    Quit date: 01/04/2015  . Smokeless tobacco: Not on file     Comment: occas still smokes when he drinks  . Alcohol Use: 0.0 oz/week    0 Standard drinks or equivalent per week     Comment: social drinks, drinks 80-120 ounces of beer three times weekly (09/21/14)    Outpatient Prescriptions Prior to Visit  Medication Sig Dispense Refill  . acyclovir (ZOVIRAX) 400 MG tablet Take 1 tablet (400 mg total) by mouth 2 (two) times daily. 60 tablet 2  . aspirin EC 81 MG tablet Take 1 tablet (81 mg total) by mouth daily. 30 tablet 0  . hydrochlorothiazide (HYDRODIURIL) 25 MG tablet TAKE 1 TABLET BY MOUTH DAILY. 90 tablet 3  . Multiple Vitamin (MULTIVITAMIN) capsule Take 1 capsule by mouth daily.    Marland Kitchen omeprazole (PRILOSEC) 20 MG capsule Take 1 capsule (20 mg total) by mouth daily. (Patient taking differently: Take 20 mg by mouth daily as needed (acid reflux). ) 30 capsule 3  . tadalafil (CIALIS) 20 MG tablet Take 1 tablet (20 mg total) by mouth daily as needed for erectile dysfunction. 30 tablet 3  . traMADol (ULTRAM) 50 MG tablet Take 1 tablet (50 mg total) by mouth every 8 (eight) hours as needed. 60 tablet 0  . Vitamin D, Ergocalciferol, (DRISDOL) 50000  UNITS CAPS capsule Take 1 capsule (50,000 Units total) by mouth every 7 (seven) days. 12 capsule 0  . tadalafil (CIALIS) 20 MG tablet Take 1 tablet (20 mg total) by mouth daily as needed for erectile dysfunction. 30 tablet 2   No facility-administered medications prior to visit.    ROS Review of Systems  Constitutional: Negative for fever, chills, fatigue and unexpected weight change.  HENT: Positive for congestion and dental problem.   Eyes: Negative for visual disturbance.  Respiratory: Negative for cough and shortness of breath.   Cardiovascular: Negative for chest pain, palpitations and leg swelling.  Gastrointestinal: Negative for nausea, vomiting, abdominal pain, diarrhea, constipation and blood in stool.  Endocrine: Negative for polydipsia, polyphagia and polyuria.  Genitourinary: Positive for frequency, hematuria and difficulty urinating. Negative for dysuria, flank pain, enuresis and genital sores.  Musculoskeletal: Positive for myalgias and arthralgias. Negative for back pain, gait problem and neck pain.  Skin: Negative for color change and rash.  Allergic/Immunologic: Negative for immunocompromised state.  Neurological: Positive for numbness and headaches.  Hematological: Negative for adenopathy. Does not bruise/bleed easily.  Psychiatric/Behavioral: Negative for suicidal ideas, sleep disturbance and dysphoric mood. The patient is nervous/anxious.     Objective:  BP 125/87 mmHg  Pulse 85  Temp(Src) 98.3 F (36.8 C) (Oral)  Resp  16  Ht 5\' 7"  (1.702 m)  Wt 242 lb (109.77 kg)  BMI 37.89 kg/m2  SpO2 95%  BP/Weight 05/04/2015 04/28/2015 [id number]  Systolic BP 0000000 [id number]  Diastolic BP 87 79 88  Wt. (Lbs) 242 - 248  BMI 37.89 - 37.72   Physical Exam  Constitutional: He appears well-developed and well-nourished. No distress.  HENT:  Head: Normocephalic and atraumatic.  Nose: Mucosal edema present.  Neck: Normal range of motion. Neck supple.  Cardiovascular: Normal rate,  regular rhythm, normal heart sounds and intact distal pulses.   Pulmonary/Chest: Effort normal and breath sounds normal.  Genitourinary: Rectum normal. Rectal exam shows no external hemorrhoid, no internal hemorrhoid, no fissure, no mass, no tenderness and anal tone normal. Guaiac negative stool. Prostate is enlarged. Prostate is not tender.  Musculoskeletal: He exhibits no edema.  Neurological: He is alert.  Skin: Skin is warm and dry. No rash noted. No erythema.  Psychiatric: He has a normal mood and affect.   Lab Results  Component Value Date   HGBA1C 5.50 01/06/2015    Assessment & Plan:   Alison was seen today for chest pain and hospitalization follow-up.  Diagnoses and all orders for this visit:  Healthcare maintenance -     Hemoccult - 1 Card (office)  BPH (benign prostatic hyperplasia) -     tamsulosin (FLOMAX) 0.4 MG CAPS capsule; Take 1 capsule (0.4 mg total) by mouth daily.  Other seasonal allergic rhinitis -     fluticasone (FLONASE) 50 MCG/ACT nasal spray; Place 2 sprays into both nostrils daily.   Meds ordered this encounter  Medications  . fluticasone (FLONASE) 50 MCG/ACT nasal spray    Sig: Place 2 sprays into both nostrils daily.    Dispense:  16 g    Refill:  6  . tamsulosin (FLOMAX) 0.4 MG CAPS capsule    Sig: Take 1 capsule (0.4 mg total) by mouth daily.    Dispense:  30 capsule    Refill:  3    Follow-up: No Follow-up on file.   Boykin Nearing MD

## 2015-05-04 NOTE — Progress Notes (Signed)
ED F/U Chest pain  Complaining of HA Pain scale # 0 No tobacco user  No suicidal thoughts in the past two weeks

## 2015-05-04 NOTE — Assessment & Plan Note (Signed)
Allergies Plan for flonase

## 2015-05-05 MED FILL — TAMSULOSIN HCL 0.4 MG CAP: 0.4 | 30 days supply | Qty: 30 | Fill #0

## 2015-05-05 MED FILL — FLUTICASONE PROP 50 MCG SPR: 50 | 30 days supply | Qty: 16 | Fill #0

## 2015-05-05 MED FILL — !CIALIS 20 MG TABLET: 20 | 30 days supply | Qty: 5 | Fill #3

## 2015-05-09 ENCOUNTER — Encounter: Payer: Self-pay | Admitting: Family Medicine

## 2015-05-09 ENCOUNTER — Ambulatory Visit (INDEPENDENT_AMBULATORY_CARE_PROVIDER_SITE_OTHER): Payer: No Typology Code available for payment source | Admitting: Family Medicine

## 2015-05-09 VITALS — BP 126/80 | HR 86 | Ht 67.0 in | Wt 242.0 lb

## 2015-05-09 DIAGNOSIS — M1711 Unilateral primary osteoarthritis, right knee: Secondary | ICD-10-CM

## 2015-05-09 DIAGNOSIS — G5602 Carpal tunnel syndrome, left upper limb: Secondary | ICD-10-CM

## 2015-05-09 DIAGNOSIS — M25532 Pain in left wrist: Secondary | ICD-10-CM

## 2015-05-09 DIAGNOSIS — M25522 Pain in left elbow: Secondary | ICD-10-CM

## 2015-05-16 ENCOUNTER — Ambulatory Visit (INDEPENDENT_AMBULATORY_CARE_PROVIDER_SITE_OTHER): Payer: Self-pay | Admitting: Family Medicine

## 2015-05-16 DIAGNOSIS — M25532 Pain in left wrist: Secondary | ICD-10-CM

## 2015-05-16 DIAGNOSIS — G5602 Carpal tunnel syndrome, left upper limb: Secondary | ICD-10-CM

## 2015-05-16 MED ORDER — METHYLPREDNISOLONE ACETATE 40 MG/ML IJ SUSP
40.0000 mg | Freq: Once | INTRAMUSCULAR | Status: AC
  Administered 2015-05-16: 40 mg via INTRA_ARTICULAR

## 2015-05-16 NOTE — Progress Notes (Signed)
Samuel Irwin - 55 y.o. male MRN [id number]  Date of birth: 1960-09-03  CC: Left arm pain, left hip pain, right knee pain  SUBJECTIVE:   HPI  Samuel Irwin is a pleasant although anxious 54 year old male here with multiple musculoskeletal issues:   Samuel Irwin has had left wrist and forearm pain for several months., which has been worsening. He has had carpal tunnel surgery in the right reports having a diagnosis of carpal tunnel on the left in the past. We are treating him for tennis elbow. He does also appear to have carpal tunnel syndrome based on last week's exam. He is here for an ultrasound-guided carpal tunnel injection. He has felt improvement wearing his brace at night. If he does not wear his brace he notices tingling pain into his radial 3 digits. He does not need shake out his hand at night.  He has not had any recent injections in the past. He denies any weakness. He does continue is making minimal process with his tennis elbow.  ROS:     As above, denies any fevers chills or night sweats. No significant weight loss. No loss of appetite.  HISTORY: Past Medical, Surgical, Social, and Family History Reviewed & Updated per EMR.  Pertinent Historical Findings include: GERD, OSA, HTN, Erectile dysfunction, prescribed cialis, history of left femur surgery and revision on the left side over 20 years ago.  Dr. Karren Cobble performed today carpal tunnel release in 2013 on the right side. He also reports having a diagnosis of carpal tunnel on the left but did not have surgery at that time.  Data review: 04/04/2015 hip films shows a relatively large heterotopic soft tissue calcifications superior to the greater tuberosity of the left femur.  Data Review: 04/18/2015 standing knee films do show tricompartmental djd with end-stage degenerative changes of the medial compartment.   OBJECTIVE: There were no vitals taken for this visit.  Physical Exam  Calm, no acute distress Nonlabored  breathing  Neck: Negative spurling's Full neck range of motion Grip strength and sensation normal in bilateral hands Strength good C4 to T1 distribution No sensory change to C4 to T1 Reflexes normal  No visible deformity of the left upper extremity. Full passive and active range of motion of the left elbow, wrist, digits. + Tinel, negative Durkan & phalen Tender throughout the dorsal aspect of the distal forearm and over the lateral epicondyle. Significant soreness with resisted extension of the wrist and especially the third digit.   Ultrasound: Long and short axis views of the carpal tunnel were obtained. There did appear to be slight tethering of the median nerve to the flexor retinaculum. We were able to peel this off slightly with her injection. Doppler was used to avoid any vascular structures. He is neurovascularly intact following the procedure.  Consent obtained and verified. Sterile betadine prep. Furthur cleansed with alcohol. Topical analgesic spray: Ethyl chloride. Joint: Left carpal tunnel Approached in typical fashion with: Ulnar in plain with a Transview Completed without difficulty Meds: 1 mL of methylprednisolone and 1 mL of 1% lidocaine Needle: 25-gauge 1.5 inches Aftercare instructions and Red flags advised.   MEDICATIONS, LABS & OTHER ORDERS: Previous Medications   ACYCLOVIR (ZOVIRAX) 400 MG TABLET    Take 1 tablet (400 mg total) by mouth 2 (two) times daily.   ASPIRIN EC 81 MG TABLET    Take 1 tablet (81 mg total) by mouth daily.   FLUTICASONE (FLONASE) 50 MCG/ACT NASAL SPRAY    Place 2 sprays  into both nostrils daily.   HYDROCHLOROTHIAZIDE (HYDRODIURIL) 25 MG TABLET    TAKE 1 TABLET BY MOUTH DAILY.   MULTIPLE VITAMIN (MULTIVITAMIN) CAPSULE    Take 1 capsule by mouth daily.   TADALAFIL (CIALIS) 20 MG TABLET    Take 1 tablet (20 mg total) by mouth daily as needed for erectile dysfunction.   TAMSULOSIN (FLOMAX) 0.4 MG CAPS CAPSULE    Take 1 capsule (0.4 mg  total) by mouth daily.   TRAMADOL (ULTRAM) 50 MG TABLET    Take 1 tablet (50 mg total) by mouth every 8 (eight) hours as needed.   VITAMIN D, ERGOCALCIFEROL, (DRISDOL) 50000 UNITS CAPS CAPSULE    Take 1 capsule (50,000 Units total) by mouth every 7 (seven) days.   Modified Medications   No medications on file   New Prescriptions   No medications on file   Discontinued Medications   No medications on file   No orders of the defined types were placed in this encounter.   ASSESSMENT & PLAN:  Left carpal tunnel: Today's exam was again consistent with carpal tunnel syndrome. No atrophy or signs of longstanding/severe CTS.  We performed carpal tunnel injection under ultrasound today. He tolerated this well. We will see him back in 3-4 weeks for his other musculoskeletal issues. He should call back with any questions or concerns following the injection.

## 2015-05-16 NOTE — Progress Notes (Addendum)
Samuel Irwin - 55 y.o. male MRN [id number]  Date of birth: [birth year]  CC: Left arm pain, left hip pain, right knee pain  SUBJECTIVE:   HPI  Samuel Irwin is a pleasant although anxious 55 year old male here with multiple musculoskeletal issues:   - He has had persistent ongoing left lateral hip pain. He has a history of a left femur fracture in 1986 that was revised and had a rod removed for unknown reasons. Over the last 3 months he has noticed increasing pain with leg press after doing many reps.  He has not had many issues with this left hip in the past. He denies any trauma. He has not had any procedures in this area since the 1980's.  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 We provided him with a troch injection. Initially he had more pain, but then it improved. He has not been lifting as many weights.    - He has had left posterior wrist pain and radial arm pain for months, which has been worsening. He has had carpal tunnel surgery in the right reports having a diagnosis of carpal tunnel on the left in the past. He does not know of any exacerbating factors. He does not need shake out his hand at night.  He has not had any recent injections. He denies any significant numbness. At last month's visit he appeared to have tennis elbow as well as mild carpal tunnel.  We provided him with exercises and stretching. He does not think he has been doing these exercises correctly.   His right-sided knee painhas had worsening knee pain over the last several months and had x-rays ordered by his PCP which were completed after his last visit here. He denies any significant swelling, locking, catching, giving way. Pain going up and down stairs as well as getting out of a chair.   ROS:     As above, denies any fevers chills or night sweats. No significant weight loss. No loss of appetite.  HISTORY: Past Medical, Surgical, Social, and Family History Reviewed & Updated per EMR.  Pertinent  Historical Findings include: GERD, OSA, HTN, Erectile dysfunction, prescribed cialis, history of left femur surgery and revision on the left side over 20 years ago. Dr. Karren Cobble performed today carpal tunnel release in 2013 on the right side. He also reports having a diagnosis of carpal tunnel on the left but did not have surgery at that time.  Data review: 04/04/2015 hip films shows a relatively large heterotopic soft tissue calcifications superior to the greater tuberosity of the left femur.  Data Review: 04/18/2015 standing knee films do show tricompartmental djd with end-stage degenerative changes of the medial compartment.  OBJECTIVE: BP 126/80 mmHg  Pulse 86  Ht 5\' 7"  (1.702 m)  Wt 242 lb (109.77 kg)  BMI 37.89 kg/m2  Physical Exam  Calm, no acute distress Nonlabored breathing  Neck: Negative spurling's Full neck range of motion Grip strength and sensation normal in bilateral hands Strength good C4 to T1 distribution No sensory change to C4 to T1 Reflexes normal  No visible deformity of the left upper extremity. Full passive and active range of motion of the left elbow, wrist, digits. + Durkan & Phalen. Negative tinel.  Tender throughout the dorsal aspect of the distal forearm and over the lateral epicondyle. Significant soreness with resisted extension of the wrist and especially the third digit.   Knee: right Normal to inspection with no erythema or effusion or obvious  bony abnormalities. Tender along the medial joint line. No patellar tenderness or condyle tenderness. ROM normal in flexion and extension and lower leg rotation. Ligaments with solid consistent endpoints including ACL, PCL, LCL, MCL. Negative Mcmurray's and provocative meniscal tests. Non painful patellar compression. Patellar and quadriceps tendons unremarkable. Pain with deep knee Bend.  Hamstring and quadriceps strength is normal.   MEDICATIONS, LABS & OTHER ORDERS: Previous Medications   ACYCLOVIR  (ZOVIRAX) 400 MG TABLET    Take 1 tablet (400 mg total) by mouth 2 (two) times daily.   ASPIRIN EC 81 MG TABLET    Take 1 tablet (81 mg total) by mouth daily.   FLUTICASONE (FLONASE) 50 MCG/ACT NASAL SPRAY    Place 2 sprays into both nostrils daily.   HYDROCHLOROTHIAZIDE (HYDRODIURIL) 25 MG TABLET    TAKE 1 TABLET BY MOUTH DAILY.   MULTIPLE VITAMIN (MULTIVITAMIN) CAPSULE    Take 1 capsule by mouth daily.   TADALAFIL (CIALIS) 20 MG TABLET    Take 1 tablet (20 mg total) by mouth daily as needed for erectile dysfunction.   TAMSULOSIN (FLOMAX) 0.4 MG CAPS CAPSULE    Take 1 capsule (0.4 mg total) by mouth daily.   TRAMADOL (ULTRAM) 50 MG TABLET    Take 1 tablet (50 mg total) by mouth every 8 (eight) hours as needed.   VITAMIN D, ERGOCALCIFEROL, (DRISDOL) 50000 UNITS CAPS CAPSULE    Take 1 capsule (50,000 Units total) by mouth every 7 (seven) days.   Modified Medications   No medications on file   New Prescriptions   No medications on file   Discontinued Medications   No medications on file   No orders of the defined types were placed in this encounter.   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 Injection at the last visit has helped, but he needs to modify his activity and do more stretching. He should ease off of any exercises such as leg press or anything that requires deep hip flexion as this may irritate the area. We discussed the possibility of surgery although I don't know that this would be feasible considering the chronicity of the soft tissue changes. Continue with stretching and strengthening exercises.   Left upper extremity pain consistent with lateral epicondylitis: No NO due to cialis.  Discussed optimal way of performing rehab exercises.  Stretching in office seemed to help.   Left carpal tunnel: today's exam did seem more c/w carpal tunnel. No atrophy or signs of  longstanding/severe CTS.  Will f/u next week for u/s guided CT injection.   Right knee djd, end stage: Declined injection Discussed rehab exercises.  Avoid deep knee bending exercises.

## 2015-05-23 ENCOUNTER — Ambulatory Visit: Payer: No Typology Code available for payment source | Admitting: Family Medicine

## 2015-05-23 ENCOUNTER — Telehealth: Payer: Self-pay | Admitting: Family Medicine

## 2015-05-23 DIAGNOSIS — K0889 Other specified disorders of teeth and supporting structures: Secondary | ICD-10-CM

## 2015-05-23 NOTE — Telephone Encounter (Signed)
Pt. Called requesting to be referred out to another dentist because the one where he was referred to can not see him anymore due that he missed two appointments. Please f/u

## 2015-05-23 NOTE — Telephone Encounter (Signed)
Dental referral placed.

## 2015-05-24 ENCOUNTER — Telehealth: Payer: Self-pay | Admitting: Family Medicine

## 2015-05-24 NOTE — Telephone Encounter (Signed)
Patient has the orange card and he missed 2 appointments and unfortunately we don't have another dentist with the orange card . Mail a letter to patient with a list of low cost dentist .

## 2015-05-26 MED FILL — HYDROCHLOROTHIAZIDE 25 MG T: 25 | 30 days supply | Qty: 30 | Fill #11

## 2015-06-05 ENCOUNTER — Encounter: Payer: Self-pay | Admitting: Family Medicine

## 2015-06-05 ENCOUNTER — Encounter: Payer: Self-pay | Admitting: Clinical

## 2015-06-05 ENCOUNTER — Ambulatory Visit: Payer: No Typology Code available for payment source | Attending: Family Medicine | Admitting: Family Medicine

## 2015-06-05 VITALS — BP 144/77 | HR 85 | Temp 98.1°F | Resp 16 | Ht 67.0 in | Wt 245.0 lb

## 2015-06-05 DIAGNOSIS — H538 Other visual disturbances: Secondary | ICD-10-CM | POA: Insufficient documentation

## 2015-06-05 DIAGNOSIS — I10 Essential (primary) hypertension: Secondary | ICD-10-CM | POA: Insufficient documentation

## 2015-06-05 DIAGNOSIS — Z7982 Long term (current) use of aspirin: Secondary | ICD-10-CM | POA: Insufficient documentation

## 2015-06-05 DIAGNOSIS — Z79899 Other long term (current) drug therapy: Secondary | ICD-10-CM | POA: Insufficient documentation

## 2015-06-05 DIAGNOSIS — J029 Acute pharyngitis, unspecified: Secondary | ICD-10-CM | POA: Insufficient documentation

## 2015-06-05 DIAGNOSIS — K0889 Other specified disorders of teeth and supporting structures: Secondary | ICD-10-CM | POA: Insufficient documentation

## 2015-06-05 DIAGNOSIS — Z87891 Personal history of nicotine dependence: Secondary | ICD-10-CM | POA: Insufficient documentation

## 2015-06-05 DIAGNOSIS — J302 Other seasonal allergic rhinitis: Secondary | ICD-10-CM | POA: Insufficient documentation

## 2015-06-05 MED ORDER — CETIRIZINE HCL 10 MG PO TABS: 10.0000 mg | ORAL_TABLET | Freq: Every day | ORAL | Status: DC

## 2015-06-05 MED ORDER — HYDROCHLOROTHIAZIDE 25 MG PO TABS: 25.0000 mg | ORAL_TABLET | Freq: Every day | ORAL | Status: DC

## 2015-06-05 MED FILL — ?CETIRIZINE HCL 10 MG TABLE: 10 | 30 days supply | Qty: 30 | Fill #0

## 2015-06-05 MED FILL — HYDROCHLOROTHIAZIDE 25 MG T: 25 | 30 days supply | Qty: 30 | Fill #0

## 2015-06-05 NOTE — Progress Notes (Signed)
Depression screen [facility] 2/9 06/05/2015 04/18/2015 01/30/2015 01/06/2015  Decreased Interest 2 0 0 0  Down, Depressed, Hopeless 1 0 0 0  PHQ - 2 Score 3 0 0 0  Altered sleeping 1 - - -  Tired, decreased energy 1 - - -  Change in appetite 2 - - -  Feeling bad or failure about yourself  2 - - -  Trouble concentrating 2 - - -  Moving slowly or fidgety/restless 0 - - -  Suicidal thoughts 0 - - -  PHQ-9 Score 11 - - -    GAD 7 : Generalized Anxiety Score 06/05/2015  Nervous, Anxious, on Edge 0  Control/stop worrying 0  Worry too much - different things 0  Trouble relaxing 0  Restless 0  Easily annoyed or irritable 0  Afraid - awful might happen 0  Total GAD 7 Score 0

## 2015-06-05 NOTE — Progress Notes (Signed)
Subjective:  Patient ID: Samuel Irwin, male    DOB: 13-Aug-1960  Age: 55 y.o. MRN: [id number]  CC: Sore Throat   HPI Samuel Irwin presents for   1. Sore throat: for 2 weeks. Improving. He felt it may have worsened following an episode of oral sex. No fever or chills. Some congestion.   2. Dental problem: he has been unable to make a f/u appt with the dentist. He no showed on appt due to illness. He missed one due to not having cash for the co-pay.   Social History  Substance Use Topics  . Smoking status: Former Smoker -- 0.25 packs/day for 15 years    Types: Cigarettes    Quit date: 01/04/2015  . Smokeless tobacco: Not on file     Comment: occas still smokes when he drinks  . Alcohol Use: 0.0 oz/week    0 Standard drinks or equivalent per week     Comment: social drinks, drinks 80-120 ounces of beer three times weekly (09/21/14)    Outpatient Prescriptions Prior to Visit  Medication Sig Dispense Refill  . acyclovir (ZOVIRAX) 400 MG tablet Take 1 tablet (400 mg total) by mouth 2 (two) times daily. 60 tablet 2  . fluticasone (FLONASE) 50 MCG/ACT nasal spray Place 2 sprays into both nostrils daily. 16 g 6  . hydrochlorothiazide (HYDRODIURIL) 25 MG tablet TAKE 1 TABLET BY MOUTH DAILY. 90 tablet 3  . Multiple Vitamin (MULTIVITAMIN) capsule Take 1 capsule by mouth daily.    . tadalafil (CIALIS) 20 MG tablet Take 1 tablet (20 mg total) by mouth daily as needed for erectile dysfunction. 30 tablet 3  . tamsulosin (FLOMAX) 0.4 MG CAPS capsule Take 1 capsule (0.4 mg total) by mouth daily. 30 capsule 3  . traMADol (ULTRAM) 50 MG tablet Take 1 tablet (50 mg total) by mouth every 8 (eight) hours as needed. 60 tablet 0  . Vitamin D, Ergocalciferol, (DRISDOL) 50000 UNITS CAPS capsule Take 1 capsule (50,000 Units total) by mouth every 7 (seven) days. 12 capsule 0  . aspirin EC 81 MG tablet Take 1 tablet (81 mg total) by mouth daily. (Patient not taking: Reported on 06/05/2015) 30 tablet 0    No facility-administered medications prior to visit.    ROS Review of Systems  Constitutional: Negative for fever, chills, fatigue and unexpected weight change.  HENT: Positive for congestion and sore throat.   Eyes: Positive for visual disturbance.  Respiratory: Negative for cough and shortness of breath.   Cardiovascular: Negative for chest pain, palpitations and leg swelling.  Gastrointestinal: Negative for nausea, vomiting, abdominal pain, diarrhea, constipation and blood in stool.  Endocrine: Negative for polydipsia, polyphagia and polyuria.  Musculoskeletal: Negative for myalgias, back pain, arthralgias, gait problem and neck pain.  Skin: Negative for rash.  Allergic/Immunologic: Negative for immunocompromised state.  Hematological: Negative for adenopathy. Does not bruise/bleed easily.  Psychiatric/Behavioral: Negative for suicidal ideas, sleep disturbance and dysphoric mood. The patient is not nervous/anxious.     Objective:  BP 144/77 mmHg  Pulse 85  Temp(Src) 98.1 F (36.7 C) (Oral)  Resp 16  Ht 5\' 7"  (1.702 m)  Wt 245 lb (111.131 kg)  BMI 38.36 kg/m2  SpO2 97%  BP/Weight 06/05/2015 Q000111Q [id number]  Systolic BP 123456 [id number] 0000000  Diastolic BP 77 80 87  Wt. (Lbs) 245 242 242  BMI 38.36 37.89 37.89    Physical Exam  Constitutional: He appears well-developed and well-nourished. No distress.  HENT:  Head: Normocephalic and atraumatic.  Right Ear: Tympanic membrane, external ear and ear canal normal.  Left Ear: Tympanic membrane, external ear and ear canal normal.  Nose: Mucosal edema present.  Mouth/Throat: Uvula is midline, oropharynx is clear and moist and mucous membranes are normal.  Neck: Normal range of motion. Neck supple.  Cardiovascular: Normal rate, regular rhythm, normal heart sounds and intact distal pulses.   Pulmonary/Chest: Effort normal and breath sounds normal.  Musculoskeletal: He exhibits no edema.  Neurological: He is alert.  Skin: Skin is  warm and dry. No rash noted. No erythema.  Psychiatric: He has a normal mood and affect.     Assessment & Plan:   There are no diagnoses linked to this encounter. Hari was seen today for sore throat.  Diagnoses and all orders for this visit:  Other seasonal allergic rhinitis -     cetirizine (ZYRTEC) 10 MG tablet; Take 1 tablet (10 mg total) by mouth daily.  Essential hypertension -     hydrochlorothiazide (HYDRODIURIL) 25 MG tablet; Take 1 tablet (25 mg total) by mouth daily.  Pain, dental -     Ambulatory referral to Dentistry  Blurry vision, bilateral -     Ambulatory referral to Optometry   Meds ordered this encounter  Medications  . cetirizine (ZYRTEC) 10 MG tablet    Sig: Take 1 tablet (10 mg total) by mouth daily.    Dispense:  30 tablet    Refill:  11  . hydrochlorothiazide (HYDRODIURIL) 25 MG tablet    Sig: Take 1 tablet (25 mg total) by mouth daily.    Dispense:  90 tablet    Refill:  3    Follow-up: No Follow-up on file.   Boykin Nearing MD

## 2015-06-05 NOTE — Progress Notes (Signed)
Sore throat x 3 weeks, productive cough Pt stated doing good now, still with scratchy throat No pain today  No suicidal thoughts

## 2015-06-05 NOTE — Patient Instructions (Addendum)
Samuel Irwin was seen today for sore throat.  Diagnoses and all orders for this visit:  Other seasonal allergic rhinitis -     cetirizine (ZYRTEC) 10 MG tablet; Take 1 tablet (10 mg total) by mouth daily.  Essential hypertension -     hydrochlorothiazide (HYDRODIURIL) 25 MG tablet; Take 1 tablet (25 mg total) by mouth daily.  Pain, dental -     Ambulatory referral to Dentistry  Blurry vision, bilateral -     Ambulatory referral to Optometry    F/u in 3 months for HTN   Dr. Adrian Blackwater

## 2015-06-06 DIAGNOSIS — I10 Essential (primary) hypertension: Secondary | ICD-10-CM | POA: Insufficient documentation

## 2015-06-06 NOTE — Assessment & Plan Note (Signed)
Normal exam except nasal congestion  Continue flonase Add zyrtec

## 2015-06-08 MED FILL — TAMSULOSIN HCL 0.4 MG CAP: 0.4 | 30 days supply | Qty: 30 | Fill #1

## 2015-06-16 ENCOUNTER — Other Ambulatory Visit: Payer: Self-pay | Admitting: Internal Medicine

## 2015-06-16 ENCOUNTER — Other Ambulatory Visit: Payer: Self-pay | Admitting: *Deleted

## 2015-06-16 MED ORDER — ACYCLOVIR 400 MG PO TABS: 400.0000 mg | ORAL_TABLET | Freq: Two times a day (BID) | ORAL | Status: DC

## 2015-06-16 MED FILL — ACYCLOVIR 400 MG TABLET: 400 | 30 days supply | Qty: 60 | Fill #0

## 2015-07-10 ENCOUNTER — Ambulatory Visit: Payer: No Typology Code available for payment source | Admitting: Family Medicine

## 2015-08-01 MED FILL — HYDROCHLOROTHIAZIDE 25 MG T: 25 | 30 days supply | Qty: 30 | Fill #1

## 2015-08-01 MED FILL — ACYCLOVIR 400 MG TABLET: 400 | 30 days supply | Qty: 60 | Fill #1

## 2015-08-01 MED FILL — ?OMEPRAZOLE DR 20 MG CAPSUL: 20 | 30 days supply | Qty: 30 | Fill #2

## 2015-08-01 MED FILL — TAMSULOSIN HCL 0.4 MG CAP: 0.4 | 30 days supply | Qty: 30 | Fill #2

## 2015-09-04 MED FILL — HYDROCHLOROTHIAZIDE 25 MG T: 25 | 30 days supply | Qty: 30 | Fill #2

## 2015-09-04 MED FILL — $Cialis 20mg tablet: 20 | 30 days supply | Qty: 10 | Fill #0

## 2015-09-04 MED FILL — ?CETIRIZINE HCL 10 MG TABLE: 10 | 30 days supply | Qty: 30 | Fill #1

## 2015-09-19 ENCOUNTER — Ambulatory Visit: Payer: Self-pay | Attending: Family Medicine | Admitting: Family Medicine

## 2015-09-19 ENCOUNTER — Encounter: Payer: Self-pay | Admitting: Family Medicine

## 2015-09-19 VITALS — BP 126/85 | HR 77 | Temp 97.6°F | Resp 17 | Ht 68.0 in | Wt 254.2 lb

## 2015-09-19 DIAGNOSIS — Z79899 Other long term (current) drug therapy: Secondary | ICD-10-CM | POA: Insufficient documentation

## 2015-09-19 DIAGNOSIS — Z23 Encounter for immunization: Secondary | ICD-10-CM

## 2015-09-19 DIAGNOSIS — Z299 Encounter for prophylactic measures, unspecified: Secondary | ICD-10-CM

## 2015-09-19 DIAGNOSIS — Z7982 Long term (current) use of aspirin: Secondary | ICD-10-CM | POA: Insufficient documentation

## 2015-09-19 DIAGNOSIS — F17210 Nicotine dependence, cigarettes, uncomplicated: Secondary | ICD-10-CM | POA: Insufficient documentation

## 2015-09-19 DIAGNOSIS — Z418 Encounter for other procedures for purposes other than remedying health state: Secondary | ICD-10-CM

## 2015-09-19 DIAGNOSIS — Z9889 Other specified postprocedural states: Secondary | ICD-10-CM | POA: Insufficient documentation

## 2015-09-19 DIAGNOSIS — Z1159 Encounter for screening for other viral diseases: Secondary | ICD-10-CM

## 2015-09-19 DIAGNOSIS — G5602 Carpal tunnel syndrome, left upper limb: Secondary | ICD-10-CM | POA: Insufficient documentation

## 2015-09-19 DIAGNOSIS — I10 Essential (primary) hypertension: Secondary | ICD-10-CM | POA: Insufficient documentation

## 2015-09-19 DIAGNOSIS — G5601 Carpal tunnel syndrome, right upper limb: Secondary | ICD-10-CM | POA: Insufficient documentation

## 2015-09-19 DIAGNOSIS — K089 Disorder of teeth and supporting structures, unspecified: Secondary | ICD-10-CM

## 2015-09-19 LAB — COMPLETE METABOLIC PANEL WITHOUT GFR
ALT: 26 U/L (ref 9–46)
AST: 18 U/L (ref 10–35)
Albumin: 4.4 g/dL (ref 3.6–5.1)
Alkaline Phosphatase: 59 U/L (ref 40–115)
BUN: 13 mg/dL (ref 7–25)
CO2: 22 mmol/L (ref 20–31)
Calcium: 9.5 mg/dL (ref 8.6–10.3)
Chloride: 104 mmol/L (ref 98–110)
Creat: 1.09 mg/dL (ref 0.70–1.33)
GFR, Est African American: 89 mL/min
GFR, Est Non African American: 77 mL/min
Glucose, Bld: 99 mg/dL (ref 65–99)
Potassium: 4.2 mmol/L (ref 3.5–5.3)
Sodium: 138 mmol/L (ref 135–146)
Total Bilirubin: 0.3 mg/dL (ref 0.2–1.2)
Total Protein: 7.4 g/dL (ref 6.1–8.1)

## 2015-09-19 MED ORDER — NAPROXEN 500 MG PO TABS
500.0000 mg | ORAL_TABLET | Freq: Two times a day (BID) | ORAL | 0 refills | Status: DC
Start: 2015-09-19 — End: 2015-10-19

## 2015-09-19 NOTE — Progress Notes (Signed)
Patients want his surgery for his wrist to be scheduled at [facility] hand [facility] Dr. Sheral Apley. [place].

## 2015-09-19 NOTE — Assessment & Plan Note (Signed)
L hand Course of naproxen Continue bracing Referral to hand surgery

## 2015-09-19 NOTE — Patient Instructions (Addendum)
Cleason was seen today for hand pain.  Diagnoses and all orders for this visit:  Need for hepatitis C screening test -     Hepatitis C antibody, reflex  Carpal tunnel syndrome of left wrist -     COMPLETE METABOLIC PANEL WITH GFR -     naproxen (NAPROSYN) 500 MG tablet; Take 1 tablet (500 mg total) by mouth 2 (two) times daily with a meal. For next 7 days -     Ambulatory referral to Hand Surgery  Essential hypertension  Need for prophylactic measure -     Tdap vaccine greater than or equal to 7yo IM  Poor dentition -     Ambulatory referral to Dentistry   F/u in 3 months for HTN

## 2015-09-19 NOTE — Progress Notes (Signed)
SUBJECTIVE:  Samuel Irwin is a 55 y.o. male presenting for his annual checkup. He also complains of L wrist pain. He ha hx of b/l carpal tunnel s/p R carpal tunnel release. He wears wrist brace on L at night. He takes OTC ibuprofen sparingly. His wrist pain has caused him to stop exercising.  Past Surgical History:  Procedure Laterality Date  . BIOPSY  10/18/2014   Procedure: BIOPSY (Gastric);  Surgeon: Danie Binder, MD;  Location: [facility];  Service: Endoscopy;;  . CARPAL TUNNEL RELEASE     R hand  . COLONOSCOPY WITH PROPOFOL N/A 10/18/2014   [id number] size external hemorrhoids/left colon is redundant  . ESOPHAGOGASTRODUODENOSCOPY (EGD) WITH PROPOFOL N/A 10/18/2014   SLF: epigastric pain due to moderate NSAID gastritis  . FEMUR SURGERY     left  . HERNIA REPAIR  04/07/12   umb hernia repair  . INSERTION OF MESH N/A 04/07/2012   Procedure: INSERTION OF MESH;  Surgeon: Madilyn Hook, DO;  Location: [facility];  Service: General;  Laterality: N/A;  . KNEE ARTHROSCOPY     right  . TOE SURGERY     dislocated lt foot  . UMBILICAL HERNIA REPAIR N/A 04/07/2012   Procedure: HERNIA REPAIR UMBILICAL ADULT;  Surgeon: Madilyn Hook, DO;  Location: [facility];  Service: General;  Laterality: N/A;  open umbilical hernia repair with mesh    Social History  Substance Use Topics  . Smoking status: Current Some Day Smoker    Packs/day: 0.25    Years: 15.00    Types: Cigarettes    Last attempt to quit: 01/04/2015  . Smokeless tobacco: Not on file     Comment: occas still smokes when he drinks  . Alcohol use 0.0 oz/week     Comment: social drinks, drinks 80-120 ounces of beer three times weekly (09/21/14)   Current Outpatient Prescriptions  Medication Sig Dispense Refill  . acyclovir (ZOVIRAX) 400 MG tablet Take 1 tablet (400 mg total) by mouth 2 (two) times daily. 60 tablet 2  . cetirizine (ZYRTEC) 10 MG tablet Take 1 tablet (10 mg total) by mouth daily. 30 tablet 11  . fluticasone (FLONASE) 50 MCG/ACT  nasal spray Place 2 sprays into both nostrils daily. 16 g 6  . hydrochlorothiazide (HYDRODIURIL) 25 MG tablet Take 1 tablet (25 mg total) by mouth daily. 90 tablet 3  . Multiple Vitamin (MULTIVITAMIN) capsule Take 1 capsule by mouth daily.    . tadalafil (CIALIS) 20 MG tablet Take 1 tablet (20 mg total) by mouth daily as needed for erectile dysfunction. 30 tablet 3  . tamsulosin (FLOMAX) 0.4 MG CAPS capsule Take 1 capsule (0.4 mg total) by mouth daily. 30 capsule 3  . Vitamin D, Ergocalciferol, (DRISDOL) 50000 UNITS CAPS capsule Take 1 capsule (50,000 Units total) by mouth every 7 (seven) days. 12 capsule 0  . aspirin EC 81 MG tablet Take 1 tablet (81 mg total) by mouth daily. (Patient not taking: Reported on 06/05/2015) 30 tablet 0  . traMADol (ULTRAM) 50 MG tablet Take 1 tablet (50 mg total) by mouth every 8 (eight) hours as needed. (Patient not taking: Reported on 09/19/2015) 60 tablet 0   No current facility-administered medications for this visit.    Allergies: Review of patient's allergies indicates no known allergies.   ROS:  Feeling well. No dyspnea or chest pain on exertion. No abdominal pain, change in bowel habits, black or bloody stools. No urinary tract or prostatic symptoms. No neurological complaints.  OBJECTIVE:  The patient appears well, alert, oriented x 3, in no distress.  BP 126/85 (BP Location: Right Arm, Patient Position: Sitting, Cuff Size: Large)   Pulse 77   Temp 97.6 F (36.4 C) (Oral)   Resp 17   Ht 5\' 8"  (1.727 m)   Wt 254 lb 3.2 oz (115.3 kg)   SpO2 96%   BMI 38.65 kg/m  ENT normal.  Neck supple. No adenopathy or thyromegaly. PERLA. Lungs are clear, good air entry, no wheezes, rhonchi or rales. S1 and S2 normal, no murmurs, regular rate and rhythm. Abdomen is soft without tenderness, guarding, mass or organomegaly. GU exam: rectum normal, no masses, prostate normal size, symmetric, no nodules or tenderness, no stool in rectal vault.  Extremities show no edema,  normal peripheral pulses. Neurological is normal without focal findings.  ASSESSMENT:  healthy adult male  PLAN:  begin progressive daily aerobic exercise program, attempt to lose weight and improve dietary compliance

## 2015-09-19 NOTE — Assessment & Plan Note (Signed)
Well-controlled.  Continue current regimen. 

## 2015-09-20 LAB — HEPATITIS C ANTIBODY: HCV Ab: NEGATIVE

## 2015-09-25 ENCOUNTER — Telehealth: Payer: Self-pay

## 2015-09-25 NOTE — Telephone Encounter (Signed)
This is the wrong number for the patient.

## 2015-09-26 ENCOUNTER — Telehealth: Payer: Self-pay

## 2015-09-26 NOTE — Telephone Encounter (Signed)
Patient was called and informed of results. 

## 2015-09-29 MED FILL — HYDROCHLOROTHIAZIDE 25 MG T: 25 | 30 days supply | Qty: 30 | Fill #3

## 2015-09-29 MED FILL — ?TAMSULOSIN HCL 0.4 MG CAP: 0.4 | 30 days supply | Qty: 30 | Fill #3

## 2015-10-10 MED FILL — ACYCLOVIR 400 MG TABLET: 400 | 30 days supply | Qty: 60 | Fill #2

## 2015-10-19 ENCOUNTER — Ambulatory Visit: Payer: No Typology Code available for payment source | Attending: Internal Medicine | Admitting: Physician Assistant

## 2015-10-19 ENCOUNTER — Encounter: Payer: Self-pay | Admitting: Physician Assistant

## 2015-10-19 VITALS — BP 133/79 | HR 82 | Temp 98.3°F | Resp 16 | Wt 248.2 lb

## 2015-10-19 DIAGNOSIS — M5441 Lumbago with sciatica, right side: Secondary | ICD-10-CM | POA: Insufficient documentation

## 2015-10-19 DIAGNOSIS — M25551 Pain in right hip: Secondary | ICD-10-CM | POA: Insufficient documentation

## 2015-10-19 DIAGNOSIS — K219 Gastro-esophageal reflux disease without esophagitis: Secondary | ICD-10-CM | POA: Insufficient documentation

## 2015-10-19 DIAGNOSIS — G5602 Carpal tunnel syndrome, left upper limb: Secondary | ICD-10-CM | POA: Insufficient documentation

## 2015-10-19 DIAGNOSIS — Z23 Encounter for immunization: Secondary | ICD-10-CM | POA: Insufficient documentation

## 2015-10-19 DIAGNOSIS — I10 Essential (primary) hypertension: Secondary | ICD-10-CM | POA: Insufficient documentation

## 2015-10-19 MED ORDER — METHOCARBAMOL 750 MG PO TABS: 750.0000 mg | ORAL_TABLET | Freq: Four times a day (QID) | ORAL | 0 refills | Status: DC

## 2015-10-19 MED ORDER — NAPROXEN 500 MG PO TABS: 500.0000 mg | ORAL_TABLET | Freq: Two times a day (BID) | ORAL | 0 refills | Status: DC

## 2015-10-19 MED FILL — NAPROXEN 500 MG TABLET: 500 | 30 days supply | Qty: 60 | Fill #0

## 2015-10-19 MED FILL — METHOCARBAMOL 750 MG TABLET: 750 | 30 days supply | Qty: 120 | Fill #0

## 2015-10-19 NOTE — Patient Instructions (Signed)

## 2015-10-19 NOTE — Progress Notes (Signed)
Pt is in the office today for right hip pain Pt states he is not in any pain today Pt states when he stands he feel something cold running down his leg
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
## 2015-10-20 ENCOUNTER — Ambulatory Visit (HOSPITAL_COMMUNITY)
Admission: RE | Admit: 2015-10-20 | Discharge: 2015-10-20 | Disposition: A | Discharge status: Home / Self Care | Payer: No Typology Code available for payment source | Source: Ambulatory Visit | Attending: Physician Assistant | Admitting: Physician Assistant

## 2015-10-20 DIAGNOSIS — M5441 Lumbago with sciatica, right side: Secondary | ICD-10-CM | POA: Insufficient documentation

## 2015-10-20 DIAGNOSIS — M47896 Other spondylosis, lumbar region: Secondary | ICD-10-CM | POA: Insufficient documentation

## 2015-10-23 ENCOUNTER — Telehealth: Payer: Self-pay

## 2015-10-23 ENCOUNTER — Telehealth: Payer: Self-pay | Admitting: Family Medicine

## 2015-10-23 NOTE — Telephone Encounter (Signed)
Pt was called 8/28 and a voicemail was left for him to return phone call.. Labs were normal.

## 2015-10-23 NOTE — Telephone Encounter (Signed)
Contacted pt to go over lab results pt did not answer lvm for pt to give me a call at his earliest convenience

## 2015-10-23 NOTE — Telephone Encounter (Signed)
Pt called back to speak with nurse regarding his lab result and also inform nurse that his right knee cap is swollen. Wants to know what he can do about it. Please follow up.  Thank you

## 2015-10-25 ENCOUNTER — Telehealth: Payer: Self-pay

## 2015-10-25 NOTE — Telephone Encounter (Signed)
Pt was calling about his Xray that he recently done , pt was told that results are not back yet and a phone call will be placed once results are in.

## 2015-10-25 NOTE — Telephone Encounter (Signed)
Pt. Called and was informed that his las results were normal. Pt. States that they can not be normal and requested to speak with the nurse. Please f/u

## 2015-10-25 NOTE — Telephone Encounter (Signed)
Contacted pt to go over lab results pt did not answer lvm for pt to give me a call back at his earliest convenience also will be mailing a letter out today

## 2015-11-08 ENCOUNTER — Other Ambulatory Visit: Payer: Self-pay | Admitting: Family Medicine

## 2015-11-08 DIAGNOSIS — N400 Enlarged prostate without lower urinary tract symptoms: Secondary | ICD-10-CM

## 2015-11-08 MED FILL — $Cialis 20mg tablet: 20 | 30 days supply | Qty: 5 | Fill #1

## 2015-11-08 MED FILL — HYDROCHLOROTHIAZIDE 25 MG T: 25 | 30 days supply | Qty: 30 | Fill #4

## 2015-11-09 ENCOUNTER — Telehealth: Payer: Self-pay | Admitting: Family Medicine

## 2015-11-09 ENCOUNTER — Encounter (HOSPITAL_COMMUNITY): Payer: Self-pay | Admitting: Emergency Medicine

## 2015-11-09 ENCOUNTER — Emergency Department (HOSPITAL_COMMUNITY): Payer: Self-pay

## 2015-11-09 DIAGNOSIS — R072 Precordial pain: Secondary | ICD-10-CM | POA: Insufficient documentation

## 2015-11-09 DIAGNOSIS — R9431 Abnormal electrocardiogram [ECG] [EKG]: Secondary | ICD-10-CM | POA: Insufficient documentation

## 2015-11-09 DIAGNOSIS — Z79899 Other long term (current) drug therapy: Secondary | ICD-10-CM | POA: Insufficient documentation

## 2015-11-09 DIAGNOSIS — F17210 Nicotine dependence, cigarettes, uncomplicated: Secondary | ICD-10-CM | POA: Insufficient documentation

## 2015-11-09 DIAGNOSIS — J302 Other seasonal allergic rhinitis: Secondary | ICD-10-CM | POA: Insufficient documentation

## 2015-11-09 DIAGNOSIS — I509 Heart failure, unspecified: Secondary | ICD-10-CM | POA: Insufficient documentation

## 2015-11-09 DIAGNOSIS — N529 Male erectile dysfunction, unspecified: Secondary | ICD-10-CM | POA: Insufficient documentation

## 2015-11-09 DIAGNOSIS — Z6836 Body mass index (BMI) 36.0-36.9, adult: Secondary | ICD-10-CM | POA: Insufficient documentation

## 2015-11-09 DIAGNOSIS — K219 Gastro-esophageal reflux disease without esophagitis: Secondary | ICD-10-CM | POA: Insufficient documentation

## 2015-11-09 DIAGNOSIS — R0789 Other chest pain: Principal | ICD-10-CM | POA: Insufficient documentation

## 2015-11-09 DIAGNOSIS — I110 Hypertensive heart disease with heart failure: Secondary | ICD-10-CM | POA: Insufficient documentation

## 2015-11-09 DIAGNOSIS — N400 Enlarged prostate without lower urinary tract symptoms: Secondary | ICD-10-CM | POA: Insufficient documentation

## 2015-11-09 DIAGNOSIS — G4733 Obstructive sleep apnea (adult) (pediatric): Secondary | ICD-10-CM | POA: Insufficient documentation

## 2015-11-09 LAB — BASIC METABOLIC PANEL
ANION GAP: 8 (ref 5–15)
BUN: 15 mg/dL (ref 6–20)
CALCIUM: 9.8 mg/dL (ref 8.9–10.3)
CO2: 27 mmol/L (ref 22–32)
Chloride: 104 mmol/L (ref 101–111)
Creatinine, Ser: 1.13 mg/dL (ref 0.61–1.24)
Glucose, Bld: 121 mg/dL — ABNORMAL HIGH (ref 65–99)
POTASSIUM: 3.6 mmol/L (ref 3.5–5.1)
Sodium: 139 mmol/L (ref 135–145)

## 2015-11-09 LAB — CBC
HEMATOCRIT: 45 % (ref 39.0–52.0)
HEMOGLOBIN: 15.2 g/dL (ref 13.0–17.0)
MCH: 31 pg (ref 26.0–34.0)
MCHC: 33.8 g/dL (ref 30.0–36.0)
MCV: 91.8 fL (ref 78.0–100.0)
Platelets: 298 10*3/uL (ref 150–400)
RBC: 4.9 MIL/uL (ref 4.22–5.81)
RDW: 14.4 % (ref 11.5–15.5)
WBC: 6.7 10*3/uL (ref 4.0–10.5)

## 2015-11-09 NOTE — Telephone Encounter (Signed)
Patient has further concerns regarding xray results..... He is experiencing hip pain and leg numbness. Please follow up with patient

## 2015-11-09 NOTE — ED Triage Notes (Signed)
Pt. reports intermittent central chest pain with mild SOB for several weeks worse today , denies chest pain at arrival , no nausea or diaphoresis , pt. added right hip pain radiating to right thiigh this week , denies injury /ambulatory .

## 2015-11-10 ENCOUNTER — Observation Stay (HOSPITAL_COMMUNITY)
Admission: EM | Admit: 2015-11-10 | Discharge: 2015-11-10 | Disposition: A | Discharge status: Home / Self Care | Payer: No Typology Code available for payment source | Attending: Family Medicine | Admitting: Family Medicine

## 2015-11-10 DIAGNOSIS — R0789 Other chest pain: Secondary | ICD-10-CM | POA: Diagnosis present

## 2015-11-10 DIAGNOSIS — R9431 Abnormal electrocardiogram [ECG] [EKG]: Secondary | ICD-10-CM

## 2015-11-10 DIAGNOSIS — R072 Precordial pain: Secondary | ICD-10-CM

## 2015-11-10 LAB — TROPONIN I

## 2015-11-10 MED ORDER — ASPIRIN 81 MG PO CHEW: 324.0000 mg | CHEWABLE_TABLET | Freq: Once | ORAL | Status: DC

## 2015-11-10 MED ORDER — ASPIRIN 81 MG PO CHEW
324.0000 mg | CHEWABLE_TABLET | Freq: Once | ORAL | Status: AC
  Administered 2015-11-10: 324 mg via ORAL
  Filled 2015-11-10: qty 4

## 2015-11-10 MED ORDER — FLUORESCEIN SODIUM 1 MG OP STRP: 1.0000 | ORAL_STRIP | Freq: Once | OPHTHALMIC | Status: DC

## 2015-11-10 MED ORDER — TETRACAINE HCL 0.5 % OP SOLN: 2.0000 [drp] | Freq: Once | OPHTHALMIC | Status: DC
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
## 2015-11-10 NOTE — ED Notes (Signed)
Pt denies chest pain at this time, states pain is worse with movement.

## 2015-11-10 NOTE — ED Notes (Signed)
Admitting MD at the bedside.  

## 2015-11-10 NOTE — Consult Note (Signed)
History and Physical  Patient Name: Samuel Irwin     [id number]    DOB: 09-12-1960    DOA: 11/10/2015 PCP: Minerva Ends, MD   Patient coming from: Home     Chief Complaint: Chest pain  HPI: Samuel Irwin is a 55 y.o. male with a past medical history significant for HTN and smoking and cocaine use who presents with chest pain.  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  He was alarmed so he went and sat in his car, and had none of the pain while he was in the car.  We will back to work, the pain occurred again when he did the same twisting motion. The pain is sharp, severe, fleeting, associated with turning to the right and lifting, and resolves with straightening up. Was not associated with diaphoresis, shortness of breath, exertion. After thinking about it, he notes that he has had a pain similar to this, a "catch" in his middle of his chest a few times with movement over the last 3 weeks.  ED course: -Afebrile, heart rate 70s, respirations, blood pressure, pulse oximetry normal -Initial ECG showed normal sinus rhythm, inverted T waves in V5-6 and III, aVF still present and unchanged and troponin was negative. -Na 139, K 3.6, Cr 1.13, WBC 6.7, Hgb 15 -CXR clear -TRH was asked to evaluate for risk stratification.  The patient had a normal nuclear stress in 2015 and normal echocardiogram at that time too.  He has had no recent surgery, no recent hemoptysis, and some chronic left leg swelling that was evaluated with a negative LE doppler US in the past.        Review of Systems:  All other systems negative except as just noted or noted in the history of present illness.  Past Medical History:    Diagnosis Date  . Constipation   . Cough   . Generalized headaches   . GERD (gastroesophageal reflux disease)   . H/O hiatal hernia   . Heart murmur   . Herpes   . Hypertension   . Umbilical hernia   . Weakness   . Wheezing     Past Surgical History:  Procedure Laterality Date  . BIOPSY  10/18/2014   Procedure: BIOPSY (Gastric);  Surgeon: Danie Binder, MD;  Location: [facility];  Service: Endoscopy;;  . CARPAL TUNNEL RELEASE     R hand  . COLONOSCOPY WITH PROPOFOL N/A 10/18/2014   [id number] size external hemorrhoids/left colon is redundant  . ESOPHAGOGASTRODUODENOSCOPY (EGD) WITH PROPOFOL N/A 10/18/2014   SLF: epigastric pain due to moderate NSAID gastritis  . FEMUR SURGERY     left  . HERNIA REPAIR  04/07/12   umb hernia repair  . INSERTION OF MESH N/A 04/07/2012   Procedure: INSERTION OF MESH;  Surgeon: Madilyn Hook, DO;  Location: [place];  Service: General;  Laterality: N/A;  . KNEE ARTHROSCOPY     right  . TOE SURGERY     dislocated lt foot  . UMBILICAL HERNIA REPAIR N/A 04/07/2012   Procedure: HERNIA REPAIR UMBILICAL ADULT;  Surgeon: Madilyn Hook, DO;  Location: [place];  Service: General;  Laterality:  N/A;  open umbilical hernia repair with mesh    Social History: Patient lives alone.  Patient walks unassisted.  He smokes occasionally.  He uses cocaine, last 2 days ago.    No Known Allergies  Family history: family history includes Cancer in his father; Hypertension in his mother.  Prior to Admission medications   Medication Sig Start Date End Date Taking? Authorizing Provider  acyclovir (ZOVIRAX) 400 MG tablet TAKE 1 TABLET BY MOUTH 2 TIMES DAILY. Patient taking differently: TAKE 1 TABLET BY MOUTH DAILY. 11/09/15  Yes Josalyn Funches, MD  cetirizine (ZYRTEC) 10 MG tablet Take 1 tablet (10 mg total) by mouth daily. Patient taking differently: Take 10 mg by mouth daily as needed for allergies.  06/05/15  Yes Josalyn Funches, MD  fluticasone (FLONASE) 50 MCG/ACT nasal  spray Place 2 sprays into both nostrils daily. Patient taking differently: Place 2 sprays into both nostrils daily as needed for allergies.  05/04/15  Yes Josalyn Funches, MD  hydrochlorothiazide (HYDRODIURIL) 25 MG tablet Take 1 tablet (25 mg total) by mouth daily. 06/05/15  Yes Josalyn Funches, MD  methocarbamol (ROBAXIN) 750 MG tablet Take 1 tablet (750 mg total) by mouth 4 (four) times daily. X 7 DAYS THEN prn muscle spasm 10/19/15  Yes Argentina Donovan, PA-C  Multiple Vitamin (MULTIVITAMIN) capsule Take 1 capsule by mouth daily.   Yes Historical Provider, MD  naproxen (NAPROSYN) 500 MG tablet Take 1 tablet (500 mg total) by mouth 2 (two) times daily with a meal. For next 7 days then prn PAIN 10/19/15  Yes Argentina Donovan, PA-C  omeprazole (PRILOSEC) 20 MG capsule Take 20 mg by mouth daily as needed (heart burn).   Yes Historical Provider, MD  tadalafil (CIALIS) 20 MG tablet Take 1 tablet (20 mg total) by mouth daily as needed for erectile dysfunction. 05/01/15  Yes Josalyn Funches, MD  tamsulosin (FLOMAX) 0.4 MG CAPS capsule TAKE 1 CAPSULE BY MOUTH DAILY. 11/09/15  Yes Boykin Nearing, MD       Physical Exam: BP 138/91   Pulse 65   Temp 98.7 F (37.1 C) (Oral)   Resp 12   Ht 5\' 8"  (1.727 m)   Wt 109.8 kg (242 lb)   SpO2 97%   BMI 36.80 kg/m  General appearance: Well-developed, adult male, alert and in no acute distress.   Eyes: Anicteric, conjunctiva pink, lids and lashes normal.     ENT: No nasal deformity, discharge, or epistaxis.  OP moist without lesions.   Skin: Warm and dry.   Cardiac: RRR, nl S1-S2, no murmurs appreciated.  Capillary refill is brisk.  JVP normal.  No LE edema.  Radial and DP pulses 2+ and symmetric.  No carotid bruits. Respiratory: Normal respiratory rate and rhythm.  CTAB without rales or wheezes. GI: Abdomen soft without rigidity.  No TTP. No ascites, distension.   MSK: No deformities or effusions.   Pain mildly reproduced with palpation of precordium.  No pain  with arm movement. Neuro: Sensorium intact and responding to questions, attention normal.  Speech is fluent.  Moves all extremities equally and with normal coordination.    Psych: Behavior slightly odd, perseverates and has some paranoid-type beliefs about people persecuting him.         Labs on Admission:  The metabolic panel shows normal electrolytes and renal function. The complete blood count shows no leukocytosis, anemia, thrombocytopenia. The troponin is negative 2.  Radiological Exams on Admission: Personally reviewed chest x-ray shows no focal opacity: Dg  Chest 2 View  Result Date: 11/09/2015 CLINICAL DATA:  Chest pain tonight. EXAM: CHEST  2 VIEW COMPARISON:  04/28/2015 FINDINGS: Cardiomediastinal silhouette is within normal limits. No airspace consolidation, edema, pleural effusion, or pneumothorax is identified. No acute osseous abnormality is seen. IMPRESSION: No active cardiopulmonary disease. Electronically Signed   By: Logan Bores M.D.   On: 11/09/2015 23:35    EKG: Independently reviewed. Rate 79, QTC 405, old T-wave inversions, unchanged from previous.  Echocardiogram 2015: EF 55-60% Normal valves  Nuclear perfusion exercise stress test 2015: Low risk        Assessment/Plan 1. Musculoskeletal chest pain: The patient has a new job with upper body use, and similar to his previous admission for chest pain has mild tenderness to palpation and non-cardiac history of pain (fleeting, associated only with position/movement).  Other causes of chest pain are doubted, including angina, GERD, pleuritis, pericarditis.  PERC negative.    -Naproxen with PPI was recommended for this pain -Smoking cessation was recommended -Cocaine cessation recommended        Medical decision making: Patient seen at 5:16 AM on 11/10/2015.  The patient was discussed with Dr. Kathrynn Humble. What exists of the patient's chart was reviewed in depth.  Clinical condition: stable.       Edwin Dada [facility] Pager [phone]

## 2015-11-10 NOTE — Discharge Instructions (Signed)
We saw you in the ER for the chest pain/shortness of breath. No signs of active heart attack. BUT - YOUR EKG HAS NEW CHANGES, and therefore you must see a Cardiologist for further workup. Please call the number provided for the earliest appointment..  Please return to the ER if you have worsening chest pain, shortness of breath, pain radiating to your jaw, shoulder, or back, sweats or fainting. Otherwise see the Cardiologist or your primary care doctor as requested.

## 2015-11-21 MED FILL — TAMSULOSIN HCL 0.4 MG CAP: 0.4 | 30 days supply | Qty: 30 | Fill #0

## 2015-11-23 ENCOUNTER — Ambulatory Visit (HOSPITAL_COMMUNITY)
Admission: EM | Admit: 2015-11-23 | Discharge: 2015-11-23 | Disposition: A | Discharge status: Home / Self Care | Payer: No Typology Code available for payment source | Attending: Emergency Medicine | Admitting: Emergency Medicine

## 2015-11-23 ENCOUNTER — Other Ambulatory Visit: Payer: Self-pay | Admitting: Family Medicine

## 2015-11-23 ENCOUNTER — Encounter (HOSPITAL_COMMUNITY): Payer: Self-pay | Admitting: Family Medicine

## 2015-11-23 DIAGNOSIS — T700XXA Otitic barotrauma, initial encounter: Secondary | ICD-10-CM

## 2015-11-23 DIAGNOSIS — H8301 Labyrinthitis, right ear: Secondary | ICD-10-CM

## 2015-11-23 DIAGNOSIS — R42 Dizziness and giddiness: Secondary | ICD-10-CM

## 2015-11-23 DIAGNOSIS — K219 Gastro-esophageal reflux disease without esophagitis: Secondary | ICD-10-CM

## 2015-11-23 MED ORDER — OMEPRAZOLE 20 MG PO CPDR: 20.0000 mg | DELAYED_RELEASE_CAPSULE | Freq: Every day | ORAL | 3 refills | Status: DC | PRN

## 2015-11-23 NOTE — ED Triage Notes (Signed)
Pt here for dizziness that started this morning. Worse with turning his head side to side. Denies any chest pain or SOB. No other neuro deficits noted. Denies any pain anywhere. Denies any new med changes. sts he has been working a lot and not drinking enough water.

## 2015-11-23 NOTE — Discharge Instructions (Signed)
Recommend taking a nonsedating antihistamine such as Allegra, Claritin or Zyrtec once a day for drainage. You may purchase an inexpensive generic form of meclizine 12.5 mg and take 1-2 tablets every 8 hours as needed for dizziness. May cause some drowsiness. If having dizziness do not recommend driving or working around [employer].

## 2015-11-23 NOTE — ED Provider Notes (Signed)
CSN: [id number]     Arrival date & time 11/23/15  1008 History   First MD Initiated Contact with Patient 11/23/15 1036     Chief Complaint  Patient presents with  . Dizziness   (Consider location/radiation/quality/duration/timing/severity/associated sxs/prior Treatment) 55 year old male states he awoke at 7:30 AM this morning was hungry got something that he went back to bed then woke again at 9 AM where he experienced sudden acute onset of dizziness. Complains of dizziness with ambulation, turning of the head one side to the other, looking down and looking up. Dizziness partially resolves when he is not moving or immobile. Denies any sort of head trauma. He did have right ear pain last week but that has since improved. Denies nausea or vomiting, falls or injuries. No history of dizziness. And denies vertiginous symptoms.      Past Medical History:  Diagnosis Date  . Constipation   . Cough   . Generalized headaches   . GERD (gastroesophageal reflux disease)   . H/O hiatal hernia   . Heart murmur   . Herpes   . Hypertension   . Umbilical hernia   . Weakness   . Wheezing    Past Surgical History:  Procedure Laterality Date  . BIOPSY  10/18/2014   Procedure: BIOPSY (Gastric);  Surgeon: Danie Binder, MD;  Location: [facility];  Service: Endoscopy;;  . CARPAL TUNNEL RELEASE     R hand  . COLONOSCOPY WITH PROPOFOL N/A 10/18/2014   [id number] size external hemorrhoids/left colon is redundant  . ESOPHAGOGASTRODUODENOSCOPY (EGD) WITH PROPOFOL N/A 10/18/2014   SLF: epigastric pain due to moderate NSAID gastritis  . FEMUR SURGERY     left  . HERNIA REPAIR  04/07/12   umb hernia repair  . INSERTION OF MESH N/A 04/07/2012   Procedure: INSERTION OF MESH;  Surgeon: Madilyn Hook, DO;  Location: [place];  Service: General;  Laterality: N/A;  . KNEE ARTHROSCOPY     right  . TOE SURGERY     dislocated lt foot  . UMBILICAL HERNIA REPAIR N/A 04/07/2012   Procedure: HERNIA REPAIR UMBILICAL ADULT;   Surgeon: Madilyn Hook, DO;  Location: [place];  Service: General;  Laterality: N/A;  open umbilical hernia repair with mesh   Family History  Problem Relation Age of Onset  . Hypertension Mother   . Cancer Father     lung  . Colon cancer Neg Hx    Social History  Substance Use Topics  . Smoking status: Current Some Day Smoker    Packs/day: 0.25    Years: 15.00    Types: Cigarettes    Last attempt to quit: 01/04/2015  . Smokeless tobacco: Never Used     Comment: occas still smokes when he drinks  . Alcohol use 0.0 oz/week     Comment: social drinks, drinks 80-120 ounces of beer three times weekly (09/21/14)    Review of Systems  Constitutional: Negative.   HENT: Negative for congestion, rhinorrhea and sore throat.   Eyes: Negative.   Respiratory: Negative.   Cardiovascular: Negative.   Gastrointestinal: Negative.   Genitourinary: Negative.   Musculoskeletal: Negative.   Skin: Negative.   Neurological: Positive for dizziness and light-headedness. Negative for tremors, seizures, syncope, facial asymmetry, speech difficulty, numbness and headaches.  Psychiatric/Behavioral: Negative.   All other systems reviewed and are negative.   Allergies  Review of patient's allergies indicates no known allergies.  Home Medications   Prior to Admission medications   Medication Sig Start Date  End Date Taking? Authorizing Provider  acyclovir (ZOVIRAX) 400 MG tablet TAKE 1 TABLET BY MOUTH 2 TIMES DAILY. Patient taking differently: TAKE 1 TABLET BY MOUTH DAILY. 11/09/15   Josalyn Funches, MD  cetirizine (ZYRTEC) 10 MG tablet Take 1 tablet (10 mg total) by mouth daily. Patient taking differently: Take 10 mg by mouth daily as needed for allergies.  06/05/15   Josalyn Funches, MD  fluticasone (FLONASE) 50 MCG/ACT nasal spray Place 2 sprays into both nostrils daily. Patient taking differently: Place 2 sprays into both nostrils daily as needed for allergies.  05/04/15   Josalyn Funches, MD   hydrochlorothiazide (HYDRODIURIL) 25 MG tablet Take 1 tablet (25 mg total) by mouth daily. 06/05/15   Josalyn Funches, MD  methocarbamol (ROBAXIN) 750 MG tablet Take 1 tablet (750 mg total) by mouth 4 (four) times daily. X 7 DAYS THEN prn muscle spasm 10/19/15   Argentina Donovan, PA-C  Multiple Vitamin (MULTIVITAMIN) capsule Take 1 capsule by mouth daily.    Historical Provider, MD  naproxen (NAPROSYN) 500 MG tablet Take 1 tablet (500 mg total) by mouth 2 (two) times daily with a meal. For next 7 days then prn PAIN 10/19/15   Argentina Donovan, PA-C  omeprazole (PRILOSEC) 20 MG capsule Take 1 capsule (20 mg total) by mouth daily as needed (heart burn). 11/23/15   Josalyn Funches, MD  tadalafil (CIALIS) 20 MG tablet Take 1 tablet (20 mg total) by mouth daily as needed for erectile dysfunction. 05/01/15   Josalyn Funches, MD  tamsulosin (FLOMAX) 0.4 MG CAPS capsule TAKE 1 CAPSULE BY MOUTH DAILY. 11/09/15   Boykin Nearing, MD   Meds Ordered and Administered this Visit  Medications - No data to display  BP 112/83 (BP Location: Left Arm)   Pulse 67   Temp 98.5 F (36.9 C) (Oral)   Resp 18   SpO2 95%  No data found.   Physical Exam  Constitutional: He is oriented to person, place, and time. He appears well-developed and well-nourished. No distress.  HENT:  Head: Normocephalic and atraumatic.  Mouth/Throat: No oropharyngeal exudate.  Right TM mildly injected and retracted. No effusion. Left TM normal. Oropharynx, thick tongue and oropharynx partially viewed. Moderate amount of frothy PND and cobblestoning. Airway widely patent.  Eyes: EOM are normal. Pupils are equal, round, and reactive to light.  Neck: Normal range of motion. Neck supple.  Cardiovascular: Normal rate, regular rhythm and normal heart sounds.   Pulmonary/Chest: Effort normal and breath sounds normal. No respiratory distress.  Lymphadenopathy:    He has no cervical adenopathy.  Neurological: He is alert and oriented to person,  place, and time. He has normal strength. He is not disoriented. No cranial nerve deficit or sensory deficit. GCS eye subscore is 4. GCS verbal subscore is 5. GCS motor subscore is 6.  Nursing note and vitals reviewed.   Urgent Care Course   Clinical Course    Procedures (including critical care time)  Labs Review Labs Reviewed - No data to display  Imaging Review No results found.   Visual Acuity Review  Right Eye Distance:   Left Eye Distance:   Bilateral Distance:    Right Eye Near:   Left Eye Near:    Bilateral Near:         MDM   1. Dizziness   2. Labyrinthitis of right ear   3. Barotitis media, initial encounter    Recommend taking a nonsedating antihistamine such as Allegra, Claritin or Zyrtec once a  day for drainage. You may purchase an inexpensive generic form of meclizine 12.5 mg and take 1-2 tablets every 8 hours as needed for dizziness. May cause some drowsiness. If having dizziness do not recommend driving or working around [employer].     Janne Napoleon, NP 11/23/15 1106

## 2015-11-24 NOTE — Telephone Encounter (Signed)
Left message requesting return call to [facility]

## 2015-12-07 MED FILL — HYDROCHLOROTHIAZIDE 25 MG T: 25 | 30 days supply | Qty: 30 | Fill #5

## 2015-12-07 MED FILL — ?OMEPRAZOLE DR 20 MG CAPSUL: 20 | 30 days supply | Qty: 30 | Fill #0

## 2015-12-07 MED FILL — ACYCLOVIR 400 MG TABLET: 400 | 30 days supply | Qty: 60 | Fill #0

## 2015-12-07 MED FILL — $Cialis 20mg tablet: 20 | 30 days supply | Qty: 5 | Fill #2

## 2015-12-21 ENCOUNTER — Ambulatory Visit: Payer: Self-pay

## 2015-12-28 ENCOUNTER — Encounter: Payer: Self-pay | Admitting: Gastroenterology

## 2016-01-09 ENCOUNTER — Other Ambulatory Visit: Payer: Self-pay | Admitting: Orthopedic Surgery

## 2016-01-10 MED FILL — HYDROCHLOROTHIAZIDE 25 MG T: 25 | 30 days supply | Qty: 30 | Fill #6

## 2016-01-10 MED FILL — ?OMEPRAZOLE DR 20 MG CAPSUL: 20 | 30 days supply | Qty: 30 | Fill #1

## 2016-01-10 MED FILL — TAMSULOSIN HCL 0.4 MG CAP: 0.4 | 30 days supply | Qty: 30 | Fill #1

## 2016-01-10 MED FILL — FLUTICASONE PROP 50 MCG SPR: 50 | 30 days supply | Qty: 16 | Fill #1

## 2016-01-10 MED FILL — $Cialis 20mg tablet: 20 | 30 days supply | Qty: 5 | Fill #3

## 2016-01-11 ENCOUNTER — Ambulatory Visit: Payer: Self-pay | Admitting: Family Medicine

## 2016-01-12 ENCOUNTER — Ambulatory Visit: Payer: Self-pay | Admitting: Family Medicine

## 2016-01-25 ENCOUNTER — Ambulatory Visit: Payer: Self-pay | Attending: Family Medicine | Admitting: Family Medicine

## 2016-01-25 ENCOUNTER — Encounter: Payer: Self-pay | Admitting: Family Medicine

## 2016-01-25 VITALS — BP 121/86 | HR 76 | Temp 97.8°F | Ht 68.0 in | Wt 240.8 lb

## 2016-01-25 DIAGNOSIS — K089 Disorder of teeth and supporting structures, unspecified: Secondary | ICD-10-CM

## 2016-01-25 DIAGNOSIS — Z7982 Long term (current) use of aspirin: Secondary | ICD-10-CM | POA: Insufficient documentation

## 2016-01-25 DIAGNOSIS — J302 Other seasonal allergic rhinitis: Secondary | ICD-10-CM | POA: Insufficient documentation

## 2016-01-25 DIAGNOSIS — Z79899 Other long term (current) drug therapy: Secondary | ICD-10-CM | POA: Insufficient documentation

## 2016-01-25 DIAGNOSIS — F17210 Nicotine dependence, cigarettes, uncomplicated: Secondary | ICD-10-CM | POA: Insufficient documentation

## 2016-01-25 DIAGNOSIS — R51 Headache: Secondary | ICD-10-CM | POA: Insufficient documentation

## 2016-01-25 DIAGNOSIS — H538 Other visual disturbances: Secondary | ICD-10-CM | POA: Insufficient documentation

## 2016-01-25 DIAGNOSIS — R079 Chest pain, unspecified: Secondary | ICD-10-CM | POA: Insufficient documentation

## 2016-01-25 DIAGNOSIS — M25552 Pain in left hip: Secondary | ICD-10-CM | POA: Insufficient documentation

## 2016-01-25 DIAGNOSIS — R519 Headache, unspecified: Secondary | ICD-10-CM

## 2016-01-25 MED ORDER — PREDNISONE 20 MG PO TABS: 40.0000 mg | ORAL_TABLET | Freq: Every day | ORAL | 0 refills | Status: DC

## 2016-01-25 MED FILL — predniSONE 20 MG TABS: 20 | 5 days supply | Qty: 10 | Fill #0

## 2016-01-25 NOTE — Patient Instructions (Addendum)
Samuel Irwin was seen today for headache.  Diagnoses and all orders for this visit:  Chest pain, unspecified type -     Ambulatory referral to Cardiology  Lateral pain of left hip -     Ambulatory referral to Orthopedic Surgery  Poor dentition -     Ambulatory referral to Dentistry  Recurrent occipital headache -     predniSONE (DELTASONE) 20 MG tablet; Take 2 tablets (40 mg total) by mouth daily with breakfast.  take robaxin at bedtime for next 5 days Take prednisone at the start of your day with food  F/u in 6 weeks for headaches   Dr. Adrian Blackwater

## 2016-01-25 NOTE — Progress Notes (Signed)
Pt is here today for headaches, and chest pains. Pt is not having chest pains today.

## 2016-01-25 NOTE — Progress Notes (Signed)
Subjective:  Patient ID: Samuel Irwin, male    DOB: 01-Jul-1960  Age: 55 y.o. MRN: [id number]  CC: Headache   HPI Samuel Irwin presents for   1. Headache: for past month. Posterior headaches. Comes and goes. Has slight headache now. Pain is dull. Worse with movement of head. There is some blurry vision. No associated nausea or vomiting. He reports more stress. Having stress at work with a co-worked. Feeling like he is being followed. He is sleeping well. He sleeps with BiPAP.   2. Chest pains: started in 10/2015. intermittent. Parasternal. Occurs at rest. He had an episode yesterday following doing 6 laps of swimming. He was sitting in the steam room. Pain was sharp occurred from left to right. Lasted for < 10 seconds. He smokes 1 cigarette per night. 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 He has been to ED twice since symptoms started. He has negative troponin, no T wave inversions on inferior and lateral leads on 11/10/15. He has hx of chest pains and had a negative treadmill stress test on 12/22/2013.   Social History  Substance Use Topics  . Smoking status: Current Some Day Smoker    Packs/day: 0.25    Years: 15.00    Types: Cigarettes    Last attempt to quit: 01/04/2015  . Smokeless tobacco: Never Used     Comment: occas still smokes when he drinks  . Alcohol use 0.0 oz/week     Comment: social drinks, drinks 80-120 ounces of beer three times weekly (09/21/14)    Outpatient Medications Prior to Visit  Medication Sig Dispense Refill  . acyclovir (ZOVIRAX) 400 MG tablet TAKE 1 TABLET BY MOUTH 2 TIMES DAILY. (Patient taking differently: TAKE 1 TABLET BY MOUTH DAILY.) 60 tablet 2  . cetirizine (ZYRTEC) 10 MG tablet Take 1 tablet (10 mg total) by mouth daily. (Patient taking differently: Take 10 mg by mouth daily as needed for allergies. ) 30 tablet 11  . fluticasone (FLONASE) 50 MCG/ACT nasal spray Place 2 sprays into both nostrils  daily. (Patient taking differently: Place 2 sprays into both nostrils daily as needed for allergies. ) 16 g 6  . hydrochlorothiazide (HYDRODIURIL) 25 MG tablet Take 1 tablet (25 mg total) by mouth daily. 90 tablet 3  . methocarbamol (ROBAXIN) 750 MG tablet Take 1 tablet (750 mg total) by mouth 4 (four) times daily. X 7 DAYS THEN prn muscle spasm 120 tablet 0  . Multiple Vitamin (MULTIVITAMIN) capsule Take 1 capsule by mouth daily.    . naproxen (NAPROSYN) 500 MG tablet Take 1 tablet (500 mg total) by mouth 2 (two) times daily with a meal. For next 7 days then prn PAIN 60 tablet 0  . omeprazole (PRILOSEC) 20 MG capsule Take 1 capsule (20 mg total) by mouth daily as needed (heart burn). 90 capsule 3  . tadalafil (CIALIS) 20 MG tablet Take 1 tablet (20 mg total) by mouth daily as needed for erectile dysfunction. 30 tablet 3  . tamsulosin (FLOMAX) 0.4 MG CAPS capsule TAKE 1 CAPSULE BY MOUTH DAILY. 30 capsule 3   No facility-administered medications prior to visit.     ROS Review of Systems  Constitutional: Negative for chills, fatigue, fever and unexpected weight change.  Eyes: Negative for visual disturbance.  Respiratory: Positive for cough. Negative for shortness of breath.   Cardiovascular: Positive for chest pain. Negative for palpitations and leg swelling.  Gastrointestinal: Negative for abdominal pain, blood in  stool, constipation, diarrhea, nausea and vomiting.  Endocrine: Negative for polydipsia, polyphagia and polyuria.  Musculoskeletal: Negative for arthralgias, back pain, gait problem, myalgias and neck pain.  Skin: Negative for rash.  Allergic/Immunologic: Negative for immunocompromised state.  Neurological: Positive for headaches.  Hematological: Negative for adenopathy. Does not bruise/bleed easily.  Psychiatric/Behavioral: Negative for dysphoric mood, sleep disturbance and suicidal ideas. The patient is nervous/anxious.     Objective:  BP 121/86 (BP Location: Right Arm,  Patient Position: Sitting, Cuff Size: Small)   Pulse 76   Temp 97.8 F (36.6 C) (Oral)   Ht 5\' 8"  (1.727 m)   Wt 240 lb 12.8 oz (109.2 kg)   SpO2 93%   BMI 36.61 kg/m   BP/Weight 01/25/2016 11/23/2015 [id number]  Systolic BP [id number]  Diastolic BP 86 83 81  Wt. (Lbs) 240.8 - -  BMI 36.61 - 36.8    Physical Exam  Constitutional: He is oriented to person, place, and time. He appears well-developed and well-nourished. No distress.  HENT:  Head: Normocephalic and atraumatic.  Nose: Mucosal edema (severe swelling ) present.  Neck: Normal range of motion. Neck supple.  Cardiovascular: Normal rate, regular rhythm, normal heart sounds and intact distal pulses.   Pulmonary/Chest: Effort normal and breath sounds normal.  Musculoskeletal: He exhibits no edema.  Neurological: He is alert and oriented to person, place, and time. He has normal reflexes. No cranial nerve deficit. Coordination normal.  Skin: Skin is warm and dry. No rash noted. No erythema.  Psychiatric: He has a normal mood and affect.     Assessment & Plan:  Tane was seen today for headache.  Diagnoses and all orders for this visit:  Chest pain, unspecified type -     Ambulatory referral to Cardiology -     aspirin EC 81 MG tablet; Take 1 tablet (81 mg total) by mouth daily. -     atorvastatin (LIPITOR) 20 MG tablet; Take 1 tablet (20 mg total) by mouth daily.  Lateral pain of left hip -     Ambulatory referral to Orthopedic Surgery  Poor dentition -     Ambulatory referral to Dentistry  Recurrent occipital headache -     predniSONE (DELTASONE) 20 MG tablet; Take 2 tablets (40 mg total) by mouth daily with breakfast.  Other seasonal allergic rhinitis   There are no diagnoses linked to this encounter.  No orders of the defined types were placed in this encounter.   Follow-up: Return in about 6 weeks (around 03/07/2016) for headaches .   Samuel Nearing MD

## 2016-01-26 ENCOUNTER — Encounter (HOSPITAL_BASED_OUTPATIENT_CLINIC_OR_DEPARTMENT_OTHER): Payer: Self-pay | Admitting: *Deleted

## 2016-01-26 NOTE — Progress Notes (Signed)
   01/26/16 1118  OBSTRUCTIVE SLEEP APNEA  Have you ever been diagnosed with sleep apnea through a sleep study? Yes  If yes, do you have and use a CPAP or BPAP machine every night? 1 (bipap)  Do you know the presssure settings on your maching? Yes  Do you snore loudly (loud enough to be heard through closed doors)?  1  Do you often feel tired, fatigued, or sleepy during the daytime (such as falling asleep during driving or talking to someone)? 0  Has anyone observed you stop breathing during your sleep? 0  Do you have, or are you being treated for high blood pressure? 1  BMI more than 35 kg/m2? 1  Age > 50 (1-yes) 1  Neck circumference greater than:Male 16 inches or larger, Male 17inches or larger? 1  Male Gender (Yes=1) 1  Obstructive Sleep Apnea Score 6

## 2016-01-29 ENCOUNTER — Telehealth: Payer: Self-pay | Admitting: Family Medicine

## 2016-01-29 DIAGNOSIS — R519 Headache, unspecified: Secondary | ICD-10-CM | POA: Insufficient documentation

## 2016-01-29 DIAGNOSIS — K089 Disorder of teeth and supporting structures, unspecified: Secondary | ICD-10-CM | POA: Insufficient documentation

## 2016-01-29 DIAGNOSIS — R51 Headache: Secondary | ICD-10-CM

## 2016-01-29 MED ORDER — ASPIRIN EC 81 MG PO TBEC: 81.0000 mg | DELAYED_RELEASE_TABLET | Freq: Every day | ORAL | 11 refills | Status: DC

## 2016-01-29 MED ORDER — ATORVASTATIN CALCIUM 20 MG PO TABS: 20.0000 mg | ORAL_TABLET | Freq: Every day | ORAL | 11 refills | Status: DC

## 2016-01-29 MED FILL — ATORVASTATIN 20 MG TABLET: 20 | 30 days supply | Qty: 30 | Fill #0

## 2016-01-29 NOTE — Assessment & Plan Note (Signed)
Headaches and swelling in nose Course of steroids Followed for nasal steroid and antihistamine

## 2016-01-29 NOTE — Assessment & Plan Note (Addendum)
Atypical chest pain with EKG changes Patient his risk for CAD (smoker, HTN controlled, hyperlipidemia) Cardiology referral placed Control BP  Add aspirin Add statin Lipitor 20 mg daily  Smoking cessation

## 2016-01-29 NOTE — Assessment & Plan Note (Signed)
Prednisone and robaxin for tension type/sinus pressure headache

## 2016-01-29 NOTE — Telephone Encounter (Signed)
Please call patient In addition to cardiology referral  Patient advised to start daily aspirin and lipitor He should wait to start the aspirin once done with prednisone

## 2016-01-30 ENCOUNTER — Encounter (HOSPITAL_BASED_OUTPATIENT_CLINIC_OR_DEPARTMENT_OTHER): Payer: Self-pay | Admitting: Certified Registered"

## 2016-01-31 ENCOUNTER — Encounter (HOSPITAL_BASED_OUTPATIENT_CLINIC_OR_DEPARTMENT_OTHER): Payer: Self-pay | Admitting: *Deleted

## 2016-01-31 ENCOUNTER — Encounter (HOSPITAL_BASED_OUTPATIENT_CLINIC_OR_DEPARTMENT_OTHER)
Admission: RE | Disposition: A | Discharge status: Home / Self Care | Payer: Self-pay | Source: Ambulatory Visit | Attending: Orthopedic Surgery

## 2016-01-31 ENCOUNTER — Ambulatory Visit (HOSPITAL_BASED_OUTPATIENT_CLINIC_OR_DEPARTMENT_OTHER)
Admission: RE | Admit: 2016-01-31 | Discharge: 2016-01-31 | Disposition: A | Discharge status: Home / Self Care | Payer: Self-pay | Source: Ambulatory Visit | Attending: Orthopedic Surgery | Admitting: Orthopedic Surgery

## 2016-01-31 DIAGNOSIS — G5602 Carpal tunnel syndrome, left upper limb: Secondary | ICD-10-CM | POA: Insufficient documentation

## 2016-01-31 DIAGNOSIS — Z79899 Other long term (current) drug therapy: Secondary | ICD-10-CM | POA: Insufficient documentation

## 2016-01-31 DIAGNOSIS — G4730 Sleep apnea, unspecified: Secondary | ICD-10-CM | POA: Insufficient documentation

## 2016-01-31 DIAGNOSIS — Z7982 Long term (current) use of aspirin: Secondary | ICD-10-CM | POA: Insufficient documentation

## 2016-01-31 DIAGNOSIS — I10 Essential (primary) hypertension: Secondary | ICD-10-CM | POA: Insufficient documentation

## 2016-01-31 DIAGNOSIS — K219 Gastro-esophageal reflux disease without esophagitis: Secondary | ICD-10-CM | POA: Insufficient documentation

## 2016-01-31 DIAGNOSIS — Z720 Tobacco use: Secondary | ICD-10-CM | POA: Insufficient documentation

## 2016-01-31 HISTORY — PX: MINOR CARPAL TUNNEL: SHX6472

## 2016-01-31 HISTORY — DX: Sleep apnea, unspecified: G47.30

## 2016-01-31 SURGERY — MINOR CARPAL TUNNEL
Anesthesia: LOCAL | Site: Wrist | Laterality: Left

## 2016-01-31 MED ORDER — CHLORHEXIDINE GLUCONATE 4 % EX LIQD: 60.0000 mL | Freq: Once | CUTANEOUS | Status: DC

## 2016-01-31 MED ORDER — SCOPOLAMINE 1 MG/3DAYS TD PT72: 1.0000 | MEDICATED_PATCH | Freq: Once | TRANSDERMAL | Status: DC | PRN

## 2016-01-31 MED ORDER — BUPIVACAINE HCL (PF) 0.25 % IJ SOLN
INTRAMUSCULAR | Status: AC
  Filled 2016-01-31: qty 120

## 2016-01-31 MED ORDER — FENTANYL CITRATE (PF) 100 MCG/2ML IJ SOLN: 50.0000 ug | INTRAMUSCULAR | Status: DC | PRN

## 2016-01-31 MED ORDER — SODIUM BICARBONATE 4 % IV SOLN
INTRAVENOUS | Status: AC
  Filled 2016-01-31: qty 5

## 2016-01-31 MED ORDER — OXYCODONE-ACETAMINOPHEN 5-325 MG PO TABS: 1.0000 | ORAL_TABLET | ORAL | 0 refills | Status: DC | PRN

## 2016-01-31 MED ORDER — LIDOCAINE HCL 2 % IJ SOLN
INTRAMUSCULAR | Status: AC
  Filled 2016-01-31: qty 20

## 2016-01-31 MED ORDER — LACTATED RINGERS IV SOLN: INTRAVENOUS | Status: DC

## 2016-01-31 MED ORDER — LIDOCAINE HCL (PF) 1 % IJ SOLN
INTRAMUSCULAR | Status: AC
  Filled 2016-01-31: qty 30

## 2016-01-31 MED ORDER — MIDAZOLAM HCL 2 MG/2ML IJ SOLN: 1.0000 mg | INTRAMUSCULAR | Status: DC | PRN

## 2016-01-31 MED ORDER — CEFAZOLIN SODIUM-DEXTROSE 2-4 GM/100ML-% IV SOLN: 2.0000 g | INTRAVENOUS | Status: DC

## 2016-01-31 SURGICAL SUPPLY — 44 items
APL SKNCLS STERI-STRIP NONHPOA (GAUZE/BANDAGES/DRESSINGS) ×1
BANDAGE ACE 4X5 VEL STRL LF (GAUZE/BANDAGES/DRESSINGS) ×2 IMPLANT
BENZOIN TINCTURE PRP APPL 2/3 (GAUZE/BANDAGES/DRESSINGS) ×2 IMPLANT
BLADE SURG 15 STRL LF DISP TIS (BLADE) ×1 IMPLANT
BLADE SURG 15 STRL SS (BLADE) ×2
BNDG CMPR 9X4 STRL LF SNTH (GAUZE/BANDAGES/DRESSINGS) ×1
BNDG ESMARK 4X9 LF (GAUZE/BANDAGES/DRESSINGS) ×1 IMPLANT
BNDG GAUZE ELAST 4 BULKY (GAUZE/BANDAGES/DRESSINGS) ×2 IMPLANT
CORDS BIPOLAR (ELECTRODE) IMPLANT
COVER BACK TABLE 60X90IN (DRAPES) ×2 IMPLANT
DRAPE EXTREMITY T 121X128X90 (DRAPE) ×2 IMPLANT
DRAPE SURG 17X23 STRL (DRAPES) ×2 IMPLANT
DURAPREP 26ML APPLICATOR (WOUND CARE) ×2 IMPLANT
GAUZE SPONGE 4X4 12PLY STRL (GAUZE/BANDAGES/DRESSINGS) ×2 IMPLANT
GLOVE BIOGEL PI IND STRL 7.5 (GLOVE) IMPLANT
GLOVE BIOGEL PI INDICATOR 7.5 (GLOVE) ×1
GLOVE SURG SYN 7.5  E (GLOVE) ×1
GLOVE SURG SYN 7.5 E (GLOVE) ×1 IMPLANT
GLOVE SURG SYN 7.5 PF PI (GLOVE) IMPLANT
GLOVE SURG SYN 8.0 (GLOVE) ×2 IMPLANT
GLOVE SURG SYN 8.0 PF PI (GLOVE) ×2 IMPLANT
GOWN STRL REUS W/ TWL LRG LVL3 (GOWN DISPOSABLE) ×1 IMPLANT
GOWN STRL REUS W/TWL LRG LVL3 (GOWN DISPOSABLE) ×2
GOWN STRL REUS W/TWL XL LVL3 (GOWN DISPOSABLE) ×4 IMPLANT
NDL PRECISIONGLIDE 27X1.5 (NEEDLE) ×1 IMPLANT
NDL SAFETY ECLIPSE 18X1.5 (NEEDLE) ×1 IMPLANT
NEEDLE HYPO 18GX1.5 SHARP (NEEDLE) ×2
NEEDLE PRECISIONGLIDE 27X1.5 (NEEDLE) ×2 IMPLANT
NS IRRIG 1000ML POUR BTL (IV SOLUTION) ×2 IMPLANT
PACK BASIN DAY SURGERY FS (CUSTOM PROCEDURE TRAY) ×2 IMPLANT
PAD CAST 3X4 CTTN HI CHSV (CAST SUPPLIES) ×1 IMPLANT
PADDING CAST ABS 4INX4YD NS (CAST SUPPLIES) ×1
PADDING CAST ABS COTTON 4X4 ST (CAST SUPPLIES) ×1 IMPLANT
PADDING CAST COTTON 3X4 STRL (CAST SUPPLIES) ×2
SHEET MEDIUM DRAPE 40X70 STRL (DRAPES) ×2 IMPLANT
STOCKINETTE 4X48 STRL (DRAPES) ×2 IMPLANT
STRIP CLOSURE SKIN 1/2X4 (GAUZE/BANDAGES/DRESSINGS) ×2 IMPLANT
SUT PROLENE 3 0 PS 2 (SUTURE) ×2 IMPLANT
SUT VICRYL RAPIDE 4-0 (SUTURE) IMPLANT
SUT VICRYL RAPIDE 4/0 PS 2 (SUTURE) IMPLANT
SYR 10ML LL (SYRINGE) ×3 IMPLANT
SYR BULB 3OZ (MISCELLANEOUS) ×2 IMPLANT
TOWEL OR 17X24 6PK STRL BLUE (TOWEL DISPOSABLE) ×2 IMPLANT
UNDERPAD 30X30 (UNDERPADS AND DIAPERS) ×2 IMPLANT

## 2016-01-31 NOTE — H&P (Signed)
Samuel Irwin is an 55 y.o. male.   Chief Complaint: Left hand and wrist numbness and tingling in the median distribution. HPI: Patient is a very pleasant 55 year old male status post right carpal tunnel release several years ago now with left-sided symptoms and positive nerve testing.  Past Medical History:  Diagnosis Date  . Constipation   . Cough   . Generalized headaches   . GERD (gastroesophageal reflux disease)   . H/O hiatal hernia   . Heart murmur   . Herpes   . Hypertension   . Sleep apnea    uses BIPAP  . Umbilical hernia   . Weakness   . Wheezing     Past Surgical History:  Procedure Laterality Date  . BIOPSY  10/18/2014   Procedure: BIOPSY (Gastric);  Surgeon: Danie Binder, MD;  Location: [facility];  Service: Endoscopy;;  . CARPAL TUNNEL RELEASE     R hand  . COLONOSCOPY WITH PROPOFOL N/A 10/18/2014   [id number] size external hemorrhoids/left colon is redundant  . ESOPHAGOGASTRODUODENOSCOPY (EGD) WITH PROPOFOL N/A 10/18/2014   SLF: epigastric pain due to moderate NSAID gastritis  . FEMUR SURGERY     left  . HERNIA REPAIR  04/07/12   umb hernia repair  . INSERTION OF MESH N/A 04/07/2012   Procedure: INSERTION OF MESH;  Surgeon: Madilyn Hook, DO;  Location: [place];  Service: General;  Laterality: N/A;  . KNEE ARTHROSCOPY     right  . TOE SURGERY     dislocated lt foot  . UMBILICAL HERNIA REPAIR N/A 04/07/2012   Procedure: HERNIA REPAIR UMBILICAL ADULT;  Surgeon: Madilyn Hook, DO;  Location: [place];  Service: General;  Laterality: N/A;  open umbilical hernia repair with mesh    Family History  Problem Relation Age of Onset  . Hypertension Mother   . Cancer Father     lung  . Colon cancer Neg Hx    Social History:  reports that he has been smoking Cigarettes.  He has a 3.75 pack-year smoking history. He has never used smokeless tobacco. He reports that he drinks alcohol. He reports that he uses drugs, including Cocaine and "Crack" cocaine.  Allergies: No  Known Allergies  Medications Prior to Admission  Medication Sig Dispense Refill  . acyclovir (ZOVIRAX) 400 MG tablet TAKE 1 TABLET BY MOUTH 2 TIMES DAILY. (Patient taking differently: TAKE 1 TABLET BY MOUTH DAILY.) 60 tablet 2  . aspirin EC 81 MG tablet Take 1 tablet (81 mg total) by mouth daily. 30 tablet 11  . atorvastatin (LIPITOR) 20 MG tablet Take 1 tablet (20 mg total) by mouth daily. 30 tablet 11  . fluticasone (FLONASE) 50 MCG/ACT nasal spray Place 2 sprays into both nostrils daily. (Patient taking differently: Place 2 sprays into both nostrils daily as needed for allergies. ) 16 g 6  . hydrochlorothiazide (HYDRODIURIL) 25 MG tablet Take 1 tablet (25 mg total) by mouth daily. 90 tablet 3  . methocarbamol (ROBAXIN) 750 MG tablet Take 1 tablet (750 mg total) by mouth 4 (four) times daily. X 7 DAYS THEN prn muscle spasm 120 tablet 0  . Multiple Vitamin (MULTIVITAMIN) capsule Take 1 capsule by mouth daily.    Marland Kitchen omeprazole (PRILOSEC) 20 MG capsule Take 1 capsule (20 mg total) by mouth daily as needed (heart burn). 90 capsule 3  . predniSONE (DELTASONE) 20 MG tablet Take 2 tablets (40 mg total) by mouth daily with breakfast. 10 tablet 0  . tadalafil (CIALIS) 20 MG tablet  Take 1 tablet (20 mg total) by mouth daily as needed for erectile dysfunction. 30 tablet 3  . tamsulosin (FLOMAX) 0.4 MG CAPS capsule TAKE 1 CAPSULE BY MOUTH DAILY. 30 capsule 3    No results found for this or any previous visit (from the past 48 hour(s)). No results found.  Review of Systems  All other systems reviewed and are negative.   Height 5\' 8"  (1.727 m), weight 108.9 kg (240 lb). Physical Exam  Constitutional: He is oriented to person, place, and time. He appears well-developed and well-nourished.  HENT:  Head: Normocephalic and atraumatic.  Neck: Normal range of motion.  Cardiovascular: Normal rate.   Respiratory: Effort normal.  Musculoskeletal:       Left wrist: He exhibits tenderness.  Patient has  left carpal tunnel syndrome with a positive Tinel's, positive Phalen's, and a positive median nerve compression test at 35 seconds on the left.  Neurological: He is alert and oriented to person, place, and time.  Skin: Skin is warm.  Psychiatric: He has a normal mood and affect. His behavior is normal. Judgment and thought content normal.     Assessment/Plan As above. Plan left carpal tunnel release under straight local.  Schuyler Amor, MD 01/31/2016, 9:50 AM

## 2016-01-31 NOTE — Telephone Encounter (Signed)
Pt was called and a VM was left informing pt of new medication that dr Adrian Blackwater wants him to take.

## 2016-01-31 NOTE — Op Note (Signed)
See dictated note [phone]

## 2016-02-01 ENCOUNTER — Encounter (HOSPITAL_BASED_OUTPATIENT_CLINIC_OR_DEPARTMENT_OTHER): Payer: Self-pay | Admitting: Orthopedic Surgery

## 2016-02-01 NOTE — Op Note (Signed)
NAME:  ALY, STALTER NO.:  [id number]  MEDICAL RECORD NO.:  [id number]  LOCATION:                                 FACILITY:  PHYSICIAN:  Sheral Apley. Rannie Craney, M.D.DATE OF BIRTH:  [birth year]  DATE OF PROCEDURE:  01/31/2016 DATE OF DISCHARGE:                              OPERATIVE REPORT   PREOPERATIVE DIAGNOSIS:  Chronic left carpal tunnel syndrome.  POSTOPERATIVE DIAGNOSIS:  Chronic left carpal tunnel syndrome.  PROCEDURE:  Left carpal tunnel release.  SURGEON:  Sheral Apley. Burney Gauze, M.D.  ASSISTANT:  None.  ANESTHESIA:  Local.  TOURNIQUET:  No tourniquet.  COMPLICATIONS:  No complications.  DRAINS:  No drains.  DESCRIPTION OF PROCEDURE:  Twenty five minutes prior being taken to the operating suite, I injected 20 mL of 2% lidocaine with epinephrine 1:100,000 and bicarb solution in and around the median and ulnar nerve in distal formed along the premarked incision paralleling the thenar crease.  After 25 minutes, the patient was taken to the operating suite, prepped and draped in sterile fashion.  A 2-cm incision made in the palmar aspect of the left hand in line with long metacarpals starting at [facility] cardinal line.  The skin was incised.  Palmar fascia was identified and split.  The distal edge of the transverse carpal ligament was identified and split with a 15 blade.  The median nerve was identified, protected with a Soil scientist.  Remaining aspects of the transverse carpal ligament were then divided under direct vision using curved blunt scissors.  The canal was inspected.  There were no osseous lesions or ganglions present.  It was irrigated and loosely closed with a 3-0 Prolene subcuticular stitch.  Steri-Strips, 4x4s, fluffs, and a compressive dressing was applied.  The patient tolerated the procedure well and went to recovery room in stable fashion.     Sheral Apley Burney Gauze, M.D.   ______________________________ Sheral Apley.  Burney Gauze, M.D.    MAW/MEDQ  D:  01/31/2016  T:  02/01/2016  Job:  [id number]

## 2016-02-05 MED FILL — ACYCLOVIR 400 MG TABLET: 400 | 30 days supply | Qty: 60 | Fill #1

## 2016-02-07 MED FILL — HYDROCHLOROTHIAZIDE 25 MG T: 25 | 30 days supply | Qty: 30 | Fill #7

## 2016-02-12 ENCOUNTER — Ambulatory Visit: Payer: Self-pay | Admitting: Cardiology

## 2016-02-13 MED FILL — $Cialis 20mg tablet: 20 | 30 days supply | Qty: 5 | Fill #4

## 2016-02-14 ENCOUNTER — Ambulatory Visit: Payer: Self-pay | Admitting: Student

## 2016-02-28 ENCOUNTER — Ambulatory Visit
Admission: RE | Admit: 2016-02-28 | Discharge: 2016-02-28 | Disposition: A | Discharge status: Home / Self Care | Payer: Self-pay | Source: Ambulatory Visit | Attending: Student | Admitting: Student

## 2016-02-28 ENCOUNTER — Ambulatory Visit (INDEPENDENT_AMBULATORY_CARE_PROVIDER_SITE_OTHER): Payer: Self-pay | Admitting: Student

## 2016-02-28 ENCOUNTER — Encounter: Payer: Self-pay | Admitting: Student

## 2016-02-28 VITALS — BP 130/80 | Ht 68.0 in | Wt 233.0 lb

## 2016-02-28 DIAGNOSIS — G8929 Other chronic pain: Secondary | ICD-10-CM | POA: Insufficient documentation

## 2016-02-28 DIAGNOSIS — M25562 Pain in left knee: Secondary | ICD-10-CM | POA: Insufficient documentation

## 2016-02-28 DIAGNOSIS — M545 Low back pain, unspecified: Secondary | ICD-10-CM | POA: Insufficient documentation

## 2016-02-28 DIAGNOSIS — M1711 Unilateral primary osteoarthritis, right knee: Secondary | ICD-10-CM | POA: Insufficient documentation

## 2016-02-28 DIAGNOSIS — M170 Bilateral primary osteoarthritis of knee: Secondary | ICD-10-CM | POA: Insufficient documentation

## 2016-02-28 DIAGNOSIS — M25561 Pain in right knee: Principal | ICD-10-CM

## 2016-02-28 DIAGNOSIS — M1731 Unilateral post-traumatic osteoarthritis, right knee: Secondary | ICD-10-CM

## 2016-02-28 NOTE — Progress Notes (Deleted)
  Samuel Irwin - 56 y.o. male MRN [id number]  Date of birth: [birth year]  SUBJECTIVE:  Including CC & ROS.  CC: right knee pain    Location:  Duration:  Quality:  Severity:  Timing:  Context:  Modifying factors:  Associated signs and symptoms:   ROS: No unexpected weight loss, fever, chills, swelling, instability, muscle pain, numbness/tingling, redness, otherwise see HPI   PMHx - Updated and reviewed.  Contributory factors include: Negative PSHx - Updated and reviewed.  Contributory factors include:  Negative FHx - Updated and reviewed.  Contributory factors include:  Negative Social Hx - Updated and reviewed. Contributory factors include: Negative Medications - reviewed   DATA REVIEWED: Right knee x-rays: tricompartmental degenerative changes, mostly medial  PHYSICAL EXAM:  VS: BP:130/80  HR: bpm  TEMP: ( )  RESP:   HT:5\' 8"  (172.7 cm)   WT:233 lb (105.7 kg)  BMI:35.5 PHYSICAL EXAM: Gen: NAD, alert, cooperative with exam, well-appearing HEENT: clear conjunctiva,  CV:  no edema, capillary refill brisk, normal rate Resp: non-labored Skin: no rashes, normal turgor  Neuro: no gross deficits.  Psych:  alert and oriented ***  ASSESSMENT & PLAN:   No problem-specific Assessment & Plan notes found for this encounter.

## 2016-02-28 NOTE — Assessment & Plan Note (Signed)
States that his back pain is not bothering him too much today. He was having some tingling on the anterior aspect of his leg which could be radiculopathy. X-ray showed mild scoliosis. Recommended MRI if he continues to have symptoms. Could consider physical therapy as well. He can call if he would like to order placed for physical therapy.

## 2016-02-28 NOTE — Assessment & Plan Note (Signed)
Name pain in the knee but believes his flareup is just from overdoing it yesterday. Offered injection but patient would like to wait. Can take anti-inflammatory medications that over-the-counter as needed. We'll get updated x-rays and he will follow-up if he would like an injection.

## 2016-02-28 NOTE — Progress Notes (Signed)
  Samuel Irwin - 56 y.o. male MRN [id number]  Date of birth: September 28, 1960  SUBJECTIVE:  Including CC & ROS.  CC: bilateral knee pain  Complains of bilateral knee pain right greater than left. He states that it began after exercising on the elliptical. He states that his left knee is not bothering him too much today. He states that at times it is a dull ache. Does not buckle or catch or lock. Denies any swelling to it. Right knee buckles at times but not recently. No catching or locking or swelling. Denies any numbness or tingling distally. He also complains of tingling on the anterior aspect of his right thigh. He states that this occurs after standing for long periods. It occurs more when he was at work. He is not currently bothering him right now. Denies any current back pain or numbness or tingling. Denies any weakness.    ROS: No unexpected weight loss, fever, chills, swelling, instability, muscle pain, numbness/tingling, redness, otherwise see HPI   PMHx - Updated and reviewed.  Contributory factors include: Hypertension, GERD PSHx - Updated and reviewed.  Contributory factors include:   carpal tunnel release, right knee arthroscopy FHx - Updated and reviewed.  Contributory factors include:  Negative Social Hx - Updated and reviewed. Contributory factors include: Negative Medications - reviewed   DATA REVIEWED: Right knee x-ray from February 2017 shows tricompartmental arthritis mostly on the medial aspect.  PHYSICAL EXAM:  VS: BP:130/80  HR: bpm  TEMP: ( )  RESP:   HT:5\' 8"  (172.7 cm)   WT:233 lb (105.7 kg)  BMI:35.5 PHYSICAL EXAM: Gen: NAD, alert, cooperative with exam, well-appearing HEENT: clear conjunctiva,  CV:  no edema, capillary refill brisk, normal rate Resp: non-labored Skin: no rashes, normal turgor  Neuro: no gross deficits.  Psych:  alert and oriented  Knee: Normal to inspection with no erythema or effusion or obvious bony abnormalities. Palpation normal with  no warmth, joint line tenderness, patellar tenderness, or condyle tenderness. ROM full in flexion and extension and lower leg rotation. Ligaments with solid consistent endpoints including ACL, PCL, LCL, MCL. Negative Mcmurray's, Apley's, and Thessalonian tests. Non painful patellar compression. Patellar glide with 1+ crepitus bilaterally. Patellar and quadriceps tendons unremarkable. Hamstring and quadriceps strength is normal.     ASSESSMENT & PLAN:   Osteoarthritis of right knee Name pain in the knee but believes his flareup is just from overdoing it yesterday. Offered injection but patient would like to wait. Can take anti-inflammatory medications that over-the-counter as needed. We'll get updated x-rays and he will follow-up if he would like an injection.  Chronic bilateral low back pain States that his back pain is not bothering him too much today. He was having some tingling on the anterior aspect of his leg which could be radiculopathy. X-ray showed mild scoliosis. Recommended MRI if he continues to have symptoms. Could consider physical therapy as well. He can call if he would like to order placed for physical therapy.

## 2016-03-05 MED FILL — ?TAMSULOSIN HCL 0.4 MG CAP: 0.4 | 30 days supply | Qty: 30 | Fill #2

## 2016-03-14 MED FILL — HYDROCHLOROTHIAZIDE 25 MG T: 25 | 30 days supply | Qty: 30 | Fill #8

## 2016-03-15 ENCOUNTER — Ambulatory Visit: Payer: Self-pay | Admitting: Cardiology

## 2016-03-15 MED FILL — $Cialis 20mg tablet: 20 | 30 days supply | Qty: 10 | Fill #5

## 2016-03-18 ENCOUNTER — Encounter: Payer: Self-pay | Admitting: Cardiology

## 2016-03-20 ENCOUNTER — Ambulatory Visit (HOSPITAL_COMMUNITY)
Admission: EM | Admit: 2016-03-20 | Discharge: 2016-03-20 | Disposition: A | Discharge status: Home / Self Care | Payer: Self-pay | Attending: Emergency Medicine | Admitting: Emergency Medicine

## 2016-03-20 ENCOUNTER — Encounter (HOSPITAL_COMMUNITY): Payer: Self-pay | Admitting: Emergency Medicine

## 2016-03-20 DIAGNOSIS — F17210 Nicotine dependence, cigarettes, uncomplicated: Secondary | ICD-10-CM | POA: Insufficient documentation

## 2016-03-20 DIAGNOSIS — R05 Cough: Secondary | ICD-10-CM | POA: Insufficient documentation

## 2016-03-20 DIAGNOSIS — I10 Essential (primary) hypertension: Secondary | ICD-10-CM | POA: Insufficient documentation

## 2016-03-20 DIAGNOSIS — R079 Chest pain, unspecified: Secondary | ICD-10-CM | POA: Insufficient documentation

## 2016-03-20 DIAGNOSIS — K449 Diaphragmatic hernia without obstruction or gangrene: Secondary | ICD-10-CM | POA: Insufficient documentation

## 2016-03-20 DIAGNOSIS — Z8249 Family history of ischemic heart disease and other diseases of the circulatory system: Secondary | ICD-10-CM | POA: Insufficient documentation

## 2016-03-20 DIAGNOSIS — J029 Acute pharyngitis, unspecified: Secondary | ICD-10-CM | POA: Insufficient documentation

## 2016-03-20 DIAGNOSIS — Z7982 Long term (current) use of aspirin: Secondary | ICD-10-CM | POA: Insufficient documentation

## 2016-03-20 DIAGNOSIS — K219 Gastro-esophageal reflux disease without esophagitis: Secondary | ICD-10-CM | POA: Insufficient documentation

## 2016-03-20 DIAGNOSIS — R059 Cough, unspecified: Secondary | ICD-10-CM

## 2016-03-20 DIAGNOSIS — Z800 Family history of malignant neoplasm of digestive organs: Secondary | ICD-10-CM | POA: Insufficient documentation

## 2016-03-20 DIAGNOSIS — G4733 Obstructive sleep apnea (adult) (pediatric): Secondary | ICD-10-CM | POA: Insufficient documentation

## 2016-03-20 DIAGNOSIS — Z79899 Other long term (current) drug therapy: Secondary | ICD-10-CM | POA: Insufficient documentation

## 2016-03-20 DIAGNOSIS — Z9889 Other specified postprocedural states: Secondary | ICD-10-CM | POA: Insufficient documentation

## 2016-03-20 NOTE — ED Triage Notes (Signed)
Patient has a scratchy throat.  Patient is very concerned for possible std .  Coughing since monday

## 2016-03-20 NOTE — Discharge Instructions (Signed)
Give [place] a working phone number so that we can contact you if needed. Refrain from sexual contact until you know your results and your partner(s) are treated if necessary.    Restart your acid reflux pill, prilosec.  Tylenol and ibuprofen together as needed for pain.  Make sure you drink plenty of extra fluids.  Some people find salt water gargles and  Traditional Medicinal's "Throat Coat" tea helpful. Take 5 mL of liquid Benadryl and 5 mL of Maalox. Mix it together, and then hold it in your mouth for as long as you can and then swallow. You may do this 4 times a day.    Go to www.goodrx.com to look up your medications. This will give you a list of where you can find your prescriptions at the most affordable prices.   Go to www.goodrx.com to look up your medications. This will give you a list of where you can find your prescriptions at the most affordable prices.

## 2016-03-20 NOTE — ED Provider Notes (Signed)
HPI  SUBJECTIVE:  Samuel Irwin is a 56 y.o. male who presents with sore, scratchy throat, nonproductive cough for 2 days. He states the symptoms started after he was "partying". He was drinking alcohol to excess, smoking tobacco, states that he had unprotected oral and vaginal sex with a new male partner. He denies having anal sex. He does also report some burning chest pain while eating. Symptoms are better with Prilosec. He has not tried anything else for this. He denies body aches, headaches, rash, fevers, nasal congestion, rhinorrhea, postnasal drip. He states that his cough is from his "scratchy throat". He denies belching, water brash, wheezing, shortness of breath. No penile discharge, rash, dysuria, urinary urgency, frequency, cloudy or odorous urine, hematuria. No sick contacts with strep or mono. He is concerned that he may have gonorrhea of the throat. He has a past medical history of gonorrhea, chlamydia, HSV, Trichomonas. No history of HIV, syphilis. He also has a history of GERD, hypertension, OSA on BiPAP. No history of diabetes, strep, mono. PMD: Minerva Ends, MD    Past Medical History:  Diagnosis Date  . Constipation   . Cough   . Generalized headaches   . GERD (gastroesophageal reflux disease)   . H/O hiatal hernia   . Heart murmur   . Herpes   . Hypertension   . Sleep apnea    uses BIPAP  . Umbilical hernia   . Weakness   . Wheezing     Past Surgical History:  Procedure Laterality Date  . BIOPSY  10/18/2014   Procedure: BIOPSY (Gastric);  Surgeon: Danie Binder, MD;  Location: [facility];  Service: Endoscopy;;  . CARPAL TUNNEL RELEASE     R hand  . COLONOSCOPY WITH PROPOFOL N/A 10/18/2014   [id number] size external hemorrhoids/left colon is redundant  . ESOPHAGOGASTRODUODENOSCOPY (EGD) WITH PROPOFOL N/A 10/18/2014   SLF: epigastric pain due to moderate NSAID gastritis  . FEMUR SURGERY     left  . HERNIA REPAIR  04/07/12   umb hernia repair  .  INSERTION OF MESH N/A 04/07/2012   Procedure: INSERTION OF MESH;  Surgeon: Madilyn Hook, DO;  Location: [place];  Service: General;  Laterality: N/A;  . KNEE ARTHROSCOPY     right  . MINOR CARPAL TUNNEL Left 01/31/2016   Procedure: MINOR CARPAL TUNNEL RELEASE LEFT;  Surgeon: Charlotte Crumb, MD;  Location: [place];  Service: Orthopedics;  Laterality: Left;  Local  . TOE SURGERY     dislocated lt foot  . UMBILICAL HERNIA REPAIR N/A 04/07/2012   Procedure: HERNIA REPAIR UMBILICAL ADULT;  Surgeon: Madilyn Hook, DO;  Location: [place];  Service: General;  Laterality: N/A;  open umbilical hernia repair with mesh    Family History  Problem Relation Age of Onset  . Hypertension Mother   . Cancer Father     lung  . Colon cancer Neg Hx     Social History  Substance Use Topics  . Smoking status: Current Some Day Smoker    Packs/day: 0.25    Years: 15.00    Types: Cigarettes    Last attempt to quit: 01/04/2015  . Smokeless tobacco: Never Used     Comment: occas still smokes when he drinks  . Alcohol use 0.0 oz/week     Comment: social drinks, drinks 80-120 ounces of beer three times weekly (09/21/14)    No current facility-administered medications for this encounter.   Current Outpatient Prescriptions:  .  acyclovir (ZOVIRAX)  400 MG tablet, TAKE 1 TABLET BY MOUTH 2 TIMES DAILY. (Patient taking differently: TAKE 1 TABLET BY MOUTH DAILY.), Disp: 60 tablet, Rfl: 2 .  aspirin EC 81 MG tablet, Take 1 tablet (81 mg total) by mouth daily. (Patient not taking: Reported on 03/20/2016), Disp: 30 tablet, Rfl: 11 .  atorvastatin (LIPITOR) 20 MG tablet, Take 1 tablet (20 mg total) by mouth daily., Disp: 30 tablet, Rfl: 11 .  fluticasone (FLONASE) 50 MCG/ACT nasal spray, Place 2 sprays into both nostrils daily. (Patient taking differently: Place 2 sprays into both nostrils daily as needed for allergies. ), Disp: 16 g, Rfl: 6 .  hydrochlorothiazide (HYDRODIURIL) 25 MG tablet, Take 1 tablet (25  mg total) by mouth daily., Disp: 90 tablet, Rfl: 3 .  Multiple Vitamin (MULTIVITAMIN) capsule, Take 1 capsule by mouth daily., Disp: , Rfl:  .  omeprazole (PRILOSEC) 20 MG capsule, Take 1 capsule (20 mg total) by mouth daily as needed (heart burn)., Disp: 90 capsule, Rfl: 3 .  tadalafil (CIALIS) 20 MG tablet, Take 1 tablet (20 mg total) by mouth daily as needed for erectile dysfunction., Disp: 30 tablet, Rfl: 3 .  tamsulosin (FLOMAX) 0.4 MG CAPS capsule, TAKE 1 CAPSULE BY MOUTH DAILY., Disp: 30 capsule, Rfl: 3  No Known Allergies   ROS  As noted in HPI.   Physical Exam  BP 125/78 (BP Location: Left Arm)   Pulse 88   Temp 98.2 F (36.8 C) (Oral)   Resp 18   SpO2 96%   Constitutional: Well developed, well nourished, no acute distress Eyes:  EOMI, conjunctiva normal bilaterally HENT: Normocephalic, atraumatic,mucus membranes moist TMs normal bilaterally. Mild nasal congestion. Normal turbinates. No sinus tenderness. Unable to completely visualize oropharynx due to large tongue. Uvula appears midline.  Neck: No cervical lymphadenopathy Respiratory: Normal inspiratory effort Cardiovascular: Normal rate Regular rhythm no murmurs rubs or gallops  GI: nondistended. No palpable spleen.  skin: No rash, skin intact Musculoskeletal: no deformities Neurologic: Alert & oriented x 3, no focal neuro deficits Psychiatric: Speech and behavior appropriate   ED Course   Medications - No data to display  No orders of the defined types were placed in this encounter.   No results found for this or any previous visit (from the past 24 hour(s)). No results found.  ED Clinical Impression  Sore throat  Cough   ED Assessment/Plan  Pt requesting testing for STD including HIV, RPR. Also sending GC throat, GC/chlam/ trich urine.   Presentation c/w sore throat/cough most likely from acid reflux vs irritation from smoking tobacco, drinking.  doubt strep, mono. Patient states he'll start  taking his prilosec on a regular basis, also advised Benadryl/Maalox mixture. We'll await lab results to determine STD treatment.   He'll follow-up with his primary care physician.   Discussed labs,  MDM, plan and followup with patient. Discussed sn/sx that should prompt return to the ED. Patient agrees with plan.   No orders of the defined types were placed in this encounter.   *This clinic note was created using Dragon dictation software. Therefore, there may be occasional mistakes despite careful proofreading.  ?   Melynda Ripple, MD 03/20/16 2141

## 2016-03-21 LAB — RPR: RPR: NONREACTIVE

## 2016-03-21 LAB — CYTOLOGY, (ORAL, ANAL, URETHRAL) ANCILLARY ONLY
Chlamydia: NEGATIVE
Neisseria Gonorrhea: NEGATIVE

## 2016-03-21 LAB — URINE CYTOLOGY ANCILLARY ONLY
Chlamydia: NEGATIVE
Neisseria Gonorrhea: NEGATIVE
TRICH (WINDOWPATH): NEGATIVE

## 2016-03-21 LAB — HIV ANTIBODY (ROUTINE TESTING W REFLEX): HIV SCREEN 4TH GENERATION: NONREACTIVE

## 2016-04-04 ENCOUNTER — Ambulatory Visit (INDEPENDENT_AMBULATORY_CARE_PROVIDER_SITE_OTHER): Payer: Self-pay | Admitting: Cardiology

## 2016-04-04 ENCOUNTER — Encounter: Payer: Self-pay | Admitting: Cardiology

## 2016-04-04 VITALS — BP 118/74 | HR 73 | Ht 68.0 in | Wt 245.0 lb

## 2016-04-04 DIAGNOSIS — Z720 Tobacco use: Secondary | ICD-10-CM

## 2016-04-04 DIAGNOSIS — E785 Hyperlipidemia, unspecified: Secondary | ICD-10-CM

## 2016-04-04 DIAGNOSIS — I10 Essential (primary) hypertension: Secondary | ICD-10-CM

## 2016-04-04 DIAGNOSIS — R0789 Other chest pain: Secondary | ICD-10-CM

## 2016-04-04 NOTE — Patient Instructions (Signed)
Medication Instructions:  The current medical regimen is effective;  continue present plan and medications.  Follow-Up: Follow up as needed with Dr Skains.  Thank you for choosing [facility]!!     
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
## 2016-04-05 NOTE — Addendum Note (Signed)
Addended by: Domenica Reamer R on: 04/05/2016 09:09 AM   Modules accepted: Orders

## 2016-04-09 MED FILL — ?ACYCLOVIR 400 MG TABLET: 400 | 30 days supply | Qty: 60 | Fill #2

## 2016-04-18 MED FILL — HYDROCHLOROTHIAZIDE 25 MG T: 25 | 30 days supply | Qty: 30 | Fill #9

## 2016-04-25 MED FILL — ?TAMSULOSIN HCL 0.4 MG CAP: 0.4 | 30 days supply | Qty: 30 | Fill #3

## 2016-04-25 MED FILL — $Cialis 20mg tablet: 20 | 30 days supply | Qty: 10 | Fill #6

## 2016-05-27 MED FILL — HYDROCHLOROTHIAZIDE 25 MG T: 25 | 30 days supply | Qty: 30 | Fill #10

## 2016-06-03 ENCOUNTER — Encounter (HOSPITAL_COMMUNITY): Payer: Self-pay | Admitting: *Deleted

## 2016-06-03 ENCOUNTER — Ambulatory Visit (HOSPITAL_COMMUNITY)
Admission: EM | Admit: 2016-06-03 | Discharge: 2016-06-03 | Disposition: A | Discharge status: Home / Self Care | Payer: Self-pay | Attending: Internal Medicine | Admitting: Internal Medicine

## 2016-06-03 DIAGNOSIS — R05 Cough: Secondary | ICD-10-CM

## 2016-06-03 DIAGNOSIS — R059 Cough, unspecified: Secondary | ICD-10-CM

## 2016-06-03 DIAGNOSIS — J40 Bronchitis, not specified as acute or chronic: Secondary | ICD-10-CM

## 2016-06-03 MED ORDER — METHYLPREDNISOLONE 4 MG PO TBPK: ORAL_TABLET | ORAL | 0 refills | Status: DC

## 2016-06-03 MED ORDER — BENZONATATE 100 MG PO CAPS: 100.0000 mg | ORAL_CAPSULE | Freq: Three times a day (TID) | ORAL | 0 refills | Status: DC

## 2016-06-03 MED ORDER — AZITHROMYCIN 250 MG PO TABS: 250.0000 mg | ORAL_TABLET | Freq: Every day | ORAL | 0 refills | Status: DC

## 2016-06-03 MED FILL — BENZONATATE 100 MG CAPSULE: 100 | 7 days supply | Qty: 21 | Fill #0

## 2016-06-03 MED FILL — AZITHROMYCIN 250 MG TABLET: 250 | 5 days supply | Qty: 6 | Fill #0

## 2016-06-03 MED FILL — ?METHYLPREDNISOLONE 4 MG TA: 4 | 6 days supply | Qty: 21 | Fill #0

## 2016-06-03 NOTE — ED Triage Notes (Signed)
Facial  Pain       Hoarseness    X   2   Weeks         Slight  Headache

## 2016-06-03 NOTE — ED Provider Notes (Signed)
CSN: [id number]     Arrival date & time 06/03/16  1311 History   None    Chief Complaint  Patient presents with  . Facial Pain   (Consider location/radiation/quality/duration/timing/severity/associated sxs/prior Treatment) Patient c/o hoarseness, uri sx's, cough, and headache for 2 weeks.   The history is provided by the patient.  URI  Presenting symptoms: congestion, cough, fatigue and rhinorrhea   Severity:  Moderate Onset quality:  Sudden Duration:  2 weeks Timing:  Constant Progression:  Worsening Chronicity:  New Relieved by:  Nothing Worsened by:  Nothing Ineffective treatments:  None tried   Past Medical History:  Diagnosis Date  . Constipation   . Cough   . Generalized headaches   . GERD (gastroesophageal reflux disease)   . H/O hiatal hernia   . Heart murmur   . Herpes   . Hypertension   . Sleep apnea    uses BIPAP  . Umbilical hernia   . Weakness   . Wheezing    Past Surgical History:  Procedure Laterality Date  . BIOPSY  10/18/2014   Procedure: BIOPSY (Gastric);  Surgeon: Danie Binder, MD;  Location: [facility];  Service: Endoscopy;;  . CARPAL TUNNEL RELEASE     R hand  . COLONOSCOPY WITH PROPOFOL N/A 10/18/2014   AJO:INOMVEHM size external hemorrhoids/left colon is redundant  . ESOPHAGOGASTRODUODENOSCOPY (EGD) WITH PROPOFOL N/A 10/18/2014   SLF: epigastric pain due to moderate NSAID gastritis  . FEMUR SURGERY     left  . HERNIA REPAIR  04/07/12   umb hernia repair  . INSERTION OF MESH N/A 04/07/2012   Procedure: INSERTION OF MESH;  Surgeon: Madilyn Hook, DO;  Location: [place];  Service: General;  Laterality: N/A;  . KNEE ARTHROSCOPY     right  . MINOR CARPAL TUNNEL Left 01/31/2016   Procedure: MINOR CARPAL TUNNEL RELEASE LEFT;  Surgeon: Charlotte Crumb, MD;  Location: [facility];  Service: Orthopedics;  Laterality: Left;  Local  . TOE SURGERY     dislocated lt foot  . UMBILICAL HERNIA REPAIR N/A 04/07/2012   Procedure: HERNIA REPAIR  UMBILICAL ADULT;  Surgeon: Madilyn Hook, DO;  Location: [place];  Service: General;  Laterality: N/A;  open umbilical hernia repair with mesh   Family History  Problem Relation Age of Onset  . Hypertension Mother   . Cancer Father     lung  . Colon cancer Neg Hx    Social History  Substance Use Topics  . Smoking status: Current Some Day Smoker    Packs/day: 0.25    Years: 15.00    Types: Cigarettes    Last attempt to quit: 01/04/2015  . Smokeless tobacco: Never Used     Comment: occas still smokes when he drinks  . Alcohol use 0.0 oz/week     Comment: social drinks, drinks 80-120 ounces of beer three times weekly (09/21/14)    Review of Systems  Constitutional: Positive for fatigue.  HENT: Positive for congestion and rhinorrhea.   Eyes: Negative.   Respiratory: Positive for cough.   Cardiovascular: Negative.   Gastrointestinal: Negative.   Endocrine: Negative.   Genitourinary: Negative.   Musculoskeletal: Negative.   Skin: Negative.   Allergic/Immunologic: Negative.   Neurological: Negative.   Hematological: Negative.   Psychiatric/Behavioral: Negative.     Allergies  Patient has no known allergies.  Home Medications   Prior to Admission medications   Medication Sig Start Date End Date Taking? Authorizing Provider  acyclovir (ZOVIRAX) 400 MG tablet  TAKE 1 TABLET BY MOUTH 2 TIMES DAILY. Patient taking differently: TAKE 1 TABLET BY MOUTH DAILY. 11/09/15   Josalyn Funches, MD  atorvastatin (LIPITOR) 20 MG tablet Take 1 tablet (20 mg total) by mouth daily. 01/29/16   Josalyn Funches, MD  azithromycin (ZITHROMAX) 250 MG tablet Take 1 tablet (250 mg total) by mouth daily. Take first 2 tablets together, then 1 every day until finished. 06/03/16   Lysbeth Penner, FNP  benzonatate (TESSALON) 100 MG capsule Take 1 capsule (100 mg total) by mouth every 8 (eight) hours. 06/03/16   Lysbeth Penner, FNP  fluticasone (FLONASE) 50 MCG/ACT nasal spray Place 2 sprays into both nostrils  daily. Patient taking differently: Place 2 sprays into both nostrils daily as needed for allergies.  05/04/15   Josalyn Funches, MD  hydrochlorothiazide (HYDRODIURIL) 25 MG tablet Take 1 tablet (25 mg total) by mouth daily. 06/05/15   Boykin Nearing, MD  methylPREDNISolone (MEDROL DOSEPAK) 4 MG TBPK tablet Take 6-5-4-3-2-1 po qd 06/03/16   Lysbeth Penner, FNP  Multiple Vitamin (MULTIVITAMIN) capsule Take 1 capsule by mouth daily.    Historical Provider, MD  omeprazole (PRILOSEC) 20 MG capsule Take 1 capsule (20 mg total) by mouth daily as needed (heart burn). 11/23/15   Josalyn Funches, MD  tadalafil (CIALIS) 20 MG tablet Take 1 tablet (20 mg total) by mouth daily as needed for erectile dysfunction. 05/01/15   Josalyn Funches, MD  tamsulosin (FLOMAX) 0.4 MG CAPS capsule TAKE 1 CAPSULE BY MOUTH DAILY. 11/09/15   Boykin Nearing, MD   Meds Ordered and Administered this Visit  Medications - No data to display  BP 115/62 (BP Location: Right Arm)   Pulse 64   Temp 98.5 F (36.9 C) (Oral)   Resp 14   SpO2 96%  No data found.   Physical Exam  Constitutional: He appears well-developed and well-nourished.  HENT:  Head: Normocephalic and atraumatic.  Right Ear: External ear normal.  Left Ear: External ear normal.  Mouth/Throat: Oropharynx is clear and moist.  Eyes: Conjunctivae and EOM are normal. Pupils are equal, round, and reactive to light.  Neck: Normal range of motion. Neck supple.  Cardiovascular: Normal rate, regular rhythm and normal heart sounds.   Pulmonary/Chest: Effort normal and breath sounds normal.  Abdominal: Soft. Bowel sounds are normal.  Nursing note and vitals reviewed.   Urgent Care Course     Procedures (including critical care time)  Labs Review Labs Reviewed - No data to display  Imaging Review No results found.   Visual Acuity Review  Right Eye Distance:   Left Eye Distance:   Bilateral Distance:    Right Eye Near:   Left Eye Near:    Bilateral  Near:         MDM   1. Bronchitis   2. Cough    Zpak Tessalon Perles Medrol dose pack 4mg  Tak 6-5-4-3-2-1 po qd #21      Lysbeth Penner, FNP 06/03/16 1451

## 2016-06-04 ENCOUNTER — Other Ambulatory Visit: Payer: Self-pay | Admitting: Family Medicine

## 2016-06-06 ENCOUNTER — Other Ambulatory Visit: Payer: Self-pay | Admitting: Family Medicine

## 2016-06-06 DIAGNOSIS — N400 Enlarged prostate without lower urinary tract symptoms: Secondary | ICD-10-CM

## 2016-06-07 ENCOUNTER — Telehealth: Payer: Self-pay | Admitting: Family Medicine

## 2016-06-07 NOTE — Telephone Encounter (Signed)
Patient came to the facility requesting refill for the prostate med and acyclovir (ZOVIRAX) 400 MG tablet   Patient stated that he really need the medication, please follow-up

## 2016-06-10 MED FILL — TAMSULOSIN HCL 0.4 MG CAP: 0.4 | 30 days supply | Qty: 30 | Fill #0

## 2016-06-10 NOTE — Telephone Encounter (Signed)
I refilled tamsulosin (prostate med). Will forward request for acyclovir to Dr. Adrian Blackwater.

## 2016-06-11 ENCOUNTER — Other Ambulatory Visit: Payer: Self-pay

## 2016-06-11 MED ORDER — ACYCLOVIR 400 MG PO TABS: 400.0000 mg | ORAL_TABLET | Freq: Two times a day (BID) | ORAL | 2 refills | Status: DC

## 2016-06-11 MED FILL — ACYCLOVIR 400 MG TABLET: 400 | 30 days supply | Qty: 60 | Fill #0

## 2016-06-11 NOTE — Telephone Encounter (Signed)
Please call patient to clarify request for acyclovir refill  Is he having a herpes outbreak? He has been on acyclovir suppressive therapy since at least 04/2011 (chart reviewed)  The recommended time frame for suppressive therapy is 12 months (max of 6 years) If he has not had outbreaks it is recommended to stop suppressive and treat an outbreak if it occurs.

## 2016-06-11 NOTE — Telephone Encounter (Signed)
Medication Refill: acyclovir (ZOVIRAX) 400 MG tablet

## 2016-06-12 NOTE — Telephone Encounter (Signed)
Pt was called and a VM was left informing pt to return phone call to discuss need for Zovirax.

## 2016-07-01 ENCOUNTER — Other Ambulatory Visit: Payer: Self-pay | Admitting: Family Medicine

## 2016-07-01 DIAGNOSIS — I10 Essential (primary) hypertension: Secondary | ICD-10-CM

## 2016-07-01 MED ORDER — HYDROCHLOROTHIAZIDE 25 MG PO TABS: 25.0000 mg | ORAL_TABLET | Freq: Every day | ORAL | 0 refills | Status: DC

## 2016-07-01 MED FILL — OMEPRAZOLE DR 20 MG CAPSULE: 20 | 30 days supply | Qty: 30 | Fill #2

## 2016-07-01 MED FILL — HYDROCHLOROTHIAZIDE 25 MG T: 25 | 30 days supply | Qty: 30 | Fill #0

## 2016-07-01 NOTE — Telephone Encounter (Signed)
Pt. Called requesting a refill on tadalafil (CIALIS) 20 MG tablet Please f/u with pt.

## 2016-07-01 NOTE — Telephone Encounter (Signed)
Hydrochlorothiazide refilled x 30 days

## 2016-07-01 NOTE — Telephone Encounter (Signed)
Pt called requesting a refill on hydrochlorothiazide (HYDRODIURIL) 25 MG tablet Pt. Would like the Rx sent to [facility] pharmacy. Please f/u

## 2016-07-03 ENCOUNTER — Other Ambulatory Visit: Payer: Self-pay | Admitting: Family Medicine

## 2016-07-03 ENCOUNTER — Telehealth: Payer: Self-pay | Admitting: Family Medicine

## 2016-07-03 DIAGNOSIS — I10 Essential (primary) hypertension: Secondary | ICD-10-CM

## 2016-07-03 NOTE — Telephone Encounter (Signed)
Pt came in to request a refill on Cialis. Please f/u with pt. Thank you.

## 2016-07-04 MED ORDER — TADALAFIL 20 MG PO TABS: 20.0000 mg | ORAL_TABLET | Freq: Every day | ORAL | 3 refills | Status: DC | PRN

## 2016-07-10 ENCOUNTER — Encounter: Payer: Self-pay | Admitting: Family Medicine

## 2016-07-11 ENCOUNTER — Other Ambulatory Visit: Payer: Self-pay | Admitting: Family Medicine

## 2016-07-12 MED FILL — $Cialis 20mg tablet: 20 | 30 days supply | Qty: 10 | Fill #0

## 2016-07-18 MED FILL — ?TAMSULOSIN HCL 0.4 MG CAP: 0.4 | 30 days supply | Qty: 30 | Fill #1

## 2016-07-31 ENCOUNTER — Other Ambulatory Visit: Payer: Self-pay | Admitting: Family Medicine

## 2016-07-31 DIAGNOSIS — I10 Essential (primary) hypertension: Secondary | ICD-10-CM

## 2016-07-31 MED FILL — HYDROCHLOROTHIAZIDE 25 MG T: 25 | 30 days supply | Qty: 30 | Fill #0

## 2016-08-05 ENCOUNTER — Encounter (HOSPITAL_COMMUNITY): Payer: Self-pay | Admitting: Emergency Medicine

## 2016-08-05 ENCOUNTER — Ambulatory Visit (HOSPITAL_COMMUNITY)
Admission: EM | Admit: 2016-08-05 | Discharge: 2016-08-05 | Disposition: A | Discharge status: Home / Self Care | Payer: Self-pay | Attending: Internal Medicine | Admitting: Internal Medicine

## 2016-08-05 DIAGNOSIS — J019 Acute sinusitis, unspecified: Secondary | ICD-10-CM

## 2016-08-05 DIAGNOSIS — B9689 Other specified bacterial agents as the cause of diseases classified elsewhere: Secondary | ICD-10-CM

## 2016-08-05 DIAGNOSIS — H6693 Otitis media, unspecified, bilateral: Secondary | ICD-10-CM

## 2016-08-05 MED ORDER — AMOXICILLIN-POT CLAVULANATE 875-125 MG PO TABS: 1.0000 | ORAL_TABLET | Freq: Two times a day (BID) | ORAL | 0 refills | Status: DC

## 2016-08-05 MED ORDER — KETOROLAC TROMETHAMINE 60 MG/2ML IM SOLN
60.0000 mg | Freq: Once | INTRAMUSCULAR | Status: AC
  Administered 2016-08-05: 60 mg via INTRAMUSCULAR

## 2016-08-05 MED ORDER — KETOROLAC TROMETHAMINE 60 MG/2ML IM SOLN
INTRAMUSCULAR | Status: AC
Start: 2016-08-05 — End: 2016-08-05
  Filled 2016-08-05: qty 2

## 2016-08-05 MED ORDER — PREDNISONE 50 MG PO TABS: 50.0000 mg | ORAL_TABLET | Freq: Every day | ORAL | 0 refills | Status: DC

## 2016-08-05 MED FILL — ACYCLOVIR 400 MG TABLET: 400 | 30 days supply | Qty: 60 | Fill #1
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
## 2016-08-05 NOTE — Discharge Instructions (Addendum)
Push fluids.  Wear your C-pap.  Prescriptions for amoxicillin/clavulanate (antibiotic) and prednisone (for severe congestion) were sent to the pharmacy.  Injection of ketorolac (anti inflammatory/pain reliever) was given tonight.  Anticipate gradual improvement in head congestion/head pain over the next several days.  Recheck or followup with primary care provider for new fever >100.5, worsening headache, increasing phlegm production/nasal discharge, or if not starting to improve in a few days.

## 2016-08-05 NOTE — ED Triage Notes (Signed)
Patient reports coughing on Saturday and his head hurt.   States he can "feel something" when he shakes his head.  Says he has felt weak for 2-3 weeks

## 2016-08-06 MED FILL — ?PREDNISONE 10 MG TABLET: 10 | 3 days supply | Qty: 15 | Fill #0

## 2016-08-06 MED FILL — AMOX-CLAV 875-125 MG TABLET: 875-125 | 14 days supply | Qty: 14 | Fill #0

## 2016-08-26 ENCOUNTER — Emergency Department (HOSPITAL_COMMUNITY)
Admission: EM | Admit: 2016-08-26 | Discharge: 2016-08-26 | Disposition: A | Discharge status: Home / Self Care | Payer: No Typology Code available for payment source | Attending: Emergency Medicine | Admitting: Emergency Medicine

## 2016-08-26 ENCOUNTER — Encounter (HOSPITAL_COMMUNITY): Payer: Self-pay

## 2016-08-26 DIAGNOSIS — F17210 Nicotine dependence, cigarettes, uncomplicated: Secondary | ICD-10-CM | POA: Diagnosis not present

## 2016-08-26 DIAGNOSIS — Y92410 Unspecified street and highway as the place of occurrence of the external cause: Secondary | ICD-10-CM | POA: Diagnosis not present

## 2016-08-26 DIAGNOSIS — Y999 Unspecified external cause status: Secondary | ICD-10-CM | POA: Insufficient documentation

## 2016-08-26 DIAGNOSIS — Z79899 Other long term (current) drug therapy: Secondary | ICD-10-CM | POA: Insufficient documentation

## 2016-08-26 DIAGNOSIS — M545 Low back pain, unspecified: Secondary | ICD-10-CM

## 2016-08-26 DIAGNOSIS — Y939 Activity, unspecified: Secondary | ICD-10-CM | POA: Diagnosis not present

## 2016-08-26 DIAGNOSIS — I10 Essential (primary) hypertension: Secondary | ICD-10-CM | POA: Diagnosis not present

## 2016-08-26 MED ORDER — METHOCARBAMOL 500 MG PO TABS: 500.0000 mg | ORAL_TABLET | Freq: Two times a day (BID) | ORAL | 0 refills | Status: DC

## 2016-08-26 MED ORDER — IBUPROFEN 600 MG PO TABS: 600.0000 mg | ORAL_TABLET | Freq: Four times a day (QID) | ORAL | 0 refills | Status: DC | PRN
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
## 2016-08-26 NOTE — Discharge Instructions (Signed)
Take ibuprofen and Robaxin as needed for pain control. You may use warm compress/hot pack as needed for symptom control. You may try the back stretches provided in the packet for further pain relief. You will continue to feel stiff and sore for the next several days. If in one week, if your symptoms are not improving, follow-up with primary care. Return to the emergency department if you develop worsening pain, numbness, tingling, confusion, slurred speech, vision changes, or vomiting.

## 2016-08-27 MED FILL — IBUPROFEN 600 MG TABLET: 600 | 10 days supply | Qty: 30 | Fill #0

## 2016-08-27 MED FILL — METHOCARBAMOL 500 MG TABLET: 500 | 10 days supply | Qty: 20 | Fill #0

## 2016-08-29 ENCOUNTER — Other Ambulatory Visit: Payer: Self-pay

## 2016-08-29 MED ORDER — TADALAFIL 10 MG PO TABS: 10.0000 mg | ORAL_TABLET | Freq: Every day | ORAL | 3 refills | Status: DC | PRN

## 2016-08-29 MED ORDER — TADALAFIL 10 MG PO TABS: ORAL_TABLET | ORAL | 3 refills | Status: DC

## 2016-08-29 MED FILL — !CIALIS 10 MG TABLET: 10 MG | 30 days supply | Qty: 20 | Fill #0

## 2016-09-02 MED FILL — ?TAMSULOSIN HCL 0.4 MG CAP: 0.4 | 30 days supply | Qty: 30 | Fill #2

## 2016-09-03 ENCOUNTER — Other Ambulatory Visit: Payer: Self-pay | Admitting: Family Medicine

## 2016-09-03 DIAGNOSIS — I10 Essential (primary) hypertension: Secondary | ICD-10-CM

## 2016-09-04 ENCOUNTER — Telehealth: Payer: Self-pay | Admitting: Pulmonary Disease

## 2016-09-04 NOTE — Telephone Encounter (Signed)
Forms are in my possession.   Pt last seen in office 11/07/14 with VS and 01/30/15 with TP.  Will need to contact patient in AM 09/05/16 -- pt needs OV with 30 compliance download in order for this to be considered.  Will hold to follow up 09/05/16

## 2016-09-06 ENCOUNTER — Other Ambulatory Visit: Payer: Self-pay | Admitting: Family Medicine

## 2016-09-06 DIAGNOSIS — I10 Essential (primary) hypertension: Secondary | ICD-10-CM

## 2016-09-06 NOTE — Telephone Encounter (Signed)
lmtcb x1 for pt. VS has several openings next week.

## 2016-09-09 MED FILL — HYDROCHLOROTHIAZIDE 25 MG T: 25 | 30 days supply | Qty: 30 | Fill #0

## 2016-09-09 NOTE — Telephone Encounter (Signed)
Spoke with pt. He has been scheduled to see VS on 09/11/16 at 4:15pm. Nothing further was needed.

## 2016-09-11 ENCOUNTER — Ambulatory Visit (INDEPENDENT_AMBULATORY_CARE_PROVIDER_SITE_OTHER): Payer: Self-pay | Admitting: Pulmonary Disease

## 2016-09-11 ENCOUNTER — Encounter: Payer: Self-pay | Admitting: Pulmonary Disease

## 2016-09-11 VITALS — BP 132/88 | HR 69 | Ht 68.0 in | Wt 229.4 lb

## 2016-09-11 DIAGNOSIS — G4733 Obstructive sleep apnea (adult) (pediatric): Secondary | ICD-10-CM

## 2016-09-11 NOTE — Patient Instructions (Signed)
Please bring in your CPAP cards to get download  Will arrange for in lab sleep study and schedule follow up after reviewing test
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
## 2016-09-12 ENCOUNTER — Telehealth: Payer: Self-pay | Admitting: Pulmonary Disease

## 2016-09-12 NOTE — Telephone Encounter (Signed)
Spoke with pt and he is aware that we will get the message to VS's nurse to give to physician. 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  Message forwarded to [employer]

## 2016-09-12 NOTE — Telephone Encounter (Signed)
I scheduled pt's sleep study at sleep lab.  Spoke to him & gave him appt info.  Nothing further needed.

## 2016-09-13 NOTE — Telephone Encounter (Signed)
He needs to have more recent compliance data.

## 2016-09-13 NOTE — Telephone Encounter (Signed)
Patient needs to bring his CPAP cards by our office to download data from both of the both of them. The download that he brought by from [facility] is one that we already had but does not qualify for DOT. Pt has to have 30 days of compliance within 30 days of his OV with Dr Halford Chessman. The download he dropped off was from July 31, 2016 which as of September 11, 2016 was outside of the 30 day window. He needs to bring his data cards in so that we can determine if he has 30 days compliance. If there is not 30 days compliance on the card then he will have to wear his machine for a full 30 nights and possibly have another face-to-face with Dr Halford Chessman for his to qualify.   Will send to Dr Halford Chessman to approve documentation.

## 2016-09-13 NOTE — Telephone Encounter (Signed)
Left a message for pt to call back

## 2016-09-17 NOTE — Telephone Encounter (Signed)
Attempted to call pt. Call went straight to voicemail. lmtcb x2 for pt.

## 2016-09-18 NOTE — Telephone Encounter (Signed)
Attempted to contact pt. No answer and I could not leave another message due to the pt's voicemail being full. Will try back.

## 2016-09-18 NOTE — Telephone Encounter (Signed)
Patient returning call - he can be reached at [phone] -pr

## 2016-09-18 NOTE — Telephone Encounter (Signed)
Pt returned call. Informed him of the recs per Ashtyn. Pt states he will try and find the paper that says how long the download has to be and will bring by both SD cards. Will forward to Ashtyn as FYI. Thanks.

## 2016-09-18 NOTE — Telephone Encounter (Signed)
lmom tcb x1 please see ashtyn's previous message

## 2016-09-18 NOTE — Telephone Encounter (Signed)
Patient calling back - pt came in and saw VS on 09/11/16 - sleep study order and scheduled for 09/26/16 - if we need to call back patient he can be reached at [phone] -pr

## 2016-09-18 NOTE — Telephone Encounter (Signed)
ATC pt- unable to vm, due to mailbox being full. Will call back

## 2016-09-20 ENCOUNTER — Ambulatory Visit: Payer: No Typology Code available for payment source | Attending: Family Medicine | Admitting: Family Medicine

## 2016-09-20 ENCOUNTER — Encounter: Payer: Self-pay | Admitting: Family Medicine

## 2016-09-20 ENCOUNTER — Telehealth: Payer: Self-pay | Admitting: Pulmonary Disease

## 2016-09-20 VITALS — BP 112/66 | HR 72 | Temp 98.2°F | Ht 68.0 in | Wt 227.0 lb

## 2016-09-20 DIAGNOSIS — H43391 Other vitreous opacities, right eye: Secondary | ICD-10-CM

## 2016-09-20 DIAGNOSIS — I10 Essential (primary) hypertension: Secondary | ICD-10-CM

## 2016-09-20 DIAGNOSIS — R5383 Other fatigue: Secondary | ICD-10-CM

## 2016-09-20 DIAGNOSIS — M79672 Pain in left foot: Secondary | ICD-10-CM | POA: Insufficient documentation

## 2016-09-20 DIAGNOSIS — Z79899 Other long term (current) drug therapy: Secondary | ICD-10-CM | POA: Insufficient documentation

## 2016-09-20 DIAGNOSIS — F17210 Nicotine dependence, cigarettes, uncomplicated: Secondary | ICD-10-CM | POA: Insufficient documentation

## 2016-09-20 DIAGNOSIS — H538 Other visual disturbances: Secondary | ICD-10-CM | POA: Insufficient documentation

## 2016-09-20 DIAGNOSIS — M898X7 Other specified disorders of bone, ankle and foot: Secondary | ICD-10-CM

## 2016-09-20 DIAGNOSIS — M79671 Pain in right foot: Secondary | ICD-10-CM | POA: Diagnosis present
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
## 2016-09-20 NOTE — Telephone Encounter (Signed)
Spoke with patient and received the cards. Downloaded the data off of one card (Dates range from 08/11/16 to 09/09/16) and the other card was corrupt.   Spoke with Ashtyn about the patient. Advised that she had received a DL and email from [facility] about the patient's usage (Dates range from 07/02/16-07/31/16).   Per the patient, he has been using the CPAP machine every night since December 2017 and the cards should have data on them to show this. The data we have shows that he has been using his machine "here and there" and simply does not have enough data to satisfy the DOT.   Ashtyn spoke with the patient and advised him to return to [facility] and tell them that one of his cards are corrupt and that he needs a new card. She also advised him to wear his CPAP machine at least 6 hours a night for the next 30 days and then call her so that he can be seen within 30 days of the CPAP download. 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   The attached downloads and the email from [facility] about the patient's behavior at their office have been placed on Dr. Juanetta Gosling desk.   Will route to him so that he is aware of the situation.

## 2016-09-21 LAB — CBC
Hematocrit: 45.9 % (ref 37.5–51.0)
Hemoglobin: 15.5 g/dL (ref 13.0–17.7)
MCH: 30.8 pg (ref 26.6–33.0)
MCHC: 33.8 g/dL (ref 31.5–35.7)
MCV: 91 fL (ref 79–97)
Platelets: 309 10*3/uL (ref 150–379)
RBC: 5.04 x10E6/uL (ref 4.14–5.80)
RDW: 14.4 % (ref 12.3–15.4)
WBC: 5.6 10*3/uL (ref 3.4–10.8)

## 2016-09-21 LAB — URIC ACID: Uric Acid: 7.2 mg/dL (ref 3.7–8.6)

## 2016-09-21 LAB — CMP14+EGFR
ALBUMIN: 4.8 g/dL (ref 3.5–5.5)
ALT: 17 IU/L (ref 0–44)
AST: 17 IU/L (ref 0–40)
Albumin/Globulin Ratio: 1.8 (ref 1.2–2.2)
Alkaline Phosphatase: 65 IU/L (ref 39–117)
BUN/Creatinine Ratio: 11 (ref 9–20)
BUN: 12 mg/dL (ref 6–24)
Bilirubin Total: 0.7 mg/dL (ref 0.0–1.2)
CO2: 25 mmol/L (ref 20–29)
Calcium: 10 mg/dL (ref 8.7–10.2)
Chloride: 102 mmol/L (ref 96–106)
Creatinine, Ser: 1.06 mg/dL (ref 0.76–1.27)
GFR, EST AFRICAN AMERICAN: 92 mL/min/{1.73_m2} (ref >59)
GFR, EST NON AFRICAN AMERICAN: 79 mL/min/{1.73_m2} (ref >59)
Globulin, Total: 2.6 g/dL (ref 1.5–4.5)
Glucose: 92 mg/dL (ref 65–99)
POTASSIUM: 4.1 mmol/L (ref 3.5–5.2)
SODIUM: 143 mmol/L (ref 134–144)
Total Protein: 7.4 g/dL (ref 6.0–8.5)

## 2016-09-21 LAB — MAGNESIUM: Magnesium: 2.3 mg/dL (ref 1.6–2.3)

## 2016-09-23 ENCOUNTER — Telehealth: Payer: Self-pay

## 2016-09-23 DIAGNOSIS — R208 Other disturbances of skin sensation: Secondary | ICD-10-CM | POA: Insufficient documentation

## 2016-09-23 DIAGNOSIS — H43391 Other vitreous opacities, right eye: Secondary | ICD-10-CM | POA: Insufficient documentation

## 2016-09-23 DIAGNOSIS — R5383 Other fatigue: Secondary | ICD-10-CM | POA: Insufficient documentation

## 2016-09-23 DIAGNOSIS — M898X7 Other specified disorders of bone, ankle and foot: Secondary | ICD-10-CM | POA: Insufficient documentation

## 2016-09-23 NOTE — Assessment & Plan Note (Signed)
Bilateral distal dorsal 5th metarsal pain Possible Morton neuroma vs stress fracture Pain only in AM when standing on hard shower surface  Plan: Supportive shoe X-ray if pain persist

## 2016-09-23 NOTE — Assessment & Plan Note (Addendum)
Floaters in R visual field This is a new problem No loss of vision, but there is nearsightedness Most likely vitreous floaters   Plan: Referral to opthalmology to rule out retinal tear or detachment
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
## 2016-09-23 NOTE — Assessment & Plan Note (Signed)
A: normal blood pressure on HCTZ 25 mg daily, goal is 120-130/<80 on treatment.  P: If BP remains below goal recommend reducing HCTZ to 12.5 mg daily CMP and magnesium level checked today

## 2016-09-23 NOTE — Telephone Encounter (Signed)
He needs to have current documentation of CPAP compliance.  Will also await results of his sleep study to determine if weight loss has improved or resolved his sleep apnea.

## 2016-09-23 NOTE — Telephone Encounter (Signed)
Pt is calling to make an appointment. Pt is set up 11/20/16 @ 3:00 pm

## 2016-09-26 ENCOUNTER — Ambulatory Visit (HOSPITAL_BASED_OUTPATIENT_CLINIC_OR_DEPARTMENT_OTHER): Payer: Self-pay | Attending: Pulmonary Disease | Admitting: Pulmonary Disease

## 2016-09-26 DIAGNOSIS — G4733 Obstructive sleep apnea (adult) (pediatric): Secondary | ICD-10-CM | POA: Insufficient documentation

## 2016-09-27 ENCOUNTER — Telehealth: Payer: Self-pay | Admitting: Pulmonary Disease

## 2016-09-27 NOTE — Telephone Encounter (Signed)
Left message for patient to call back  

## 2016-09-30 ENCOUNTER — Telehealth: Payer: Self-pay | Admitting: Pulmonary Disease

## 2016-09-30 DIAGNOSIS — G4733 Obstructive sleep apnea (adult) (pediatric): Secondary | ICD-10-CM

## 2016-09-30 NOTE — Telephone Encounter (Signed)
lmtcb x2 for pt. 

## 2016-09-30 NOTE — Procedures (Signed)
    Patient Name: Samuel Irwin, Samuel Irwin Date: 09/26/2016   Gender: Male  D.O.B: 06/08/1960  Age (years): 17  Referring Provider: Chesley Mires MD, ABSM  Height (inches): 34  Interpreting Physician: Chesley Mires MD, ABSM  Weight (lbs): 225  RPSGT: Zadie Rhine   BMI: 34  MRN: [id number]  Neck Size: 17.00    CLINICAL INFORMATION  Sleep Study Type: NPSG Indication for sleep study: 56 yo male with history of severe obstructive sleep apnea on CPAP therapy. He had sleep study 08/27/12 which showed an AHI of 87. He lost about 25 lbs and felt his sleep had improved. He presents to the sleep lab to assess status of sleep apnea after weight loss. Epworth Sleepiness Score: 9 SLEEP STUDY TECHNIQUE  As per the AASM Manual for the Scoring of Sleep and Associated Events v2.3 (April 2016) with a hypopnea requiring 4% desaturations.  The channels recorded and monitored were frontal, central and occipital EEG, electrooculogram (EOG), submentalis EMG (chin), nasal and oral airflow, thoracic and abdominal wall motion, anterior tibialis EMG, snore microphone, electrocardiogram, and pulse oximetry.  MEDICATIONS  Medications self-administered by patient taken the night of the study : N/A  SLEEP ARCHITECTURE  The study was initiated at 10:33:32 PM and ended at 4:53:13 AM.  Sleep onset time was 4.5 minutes and the sleep efficiency was 89.8%. The total sleep time was 341.0 minutes.  Stage REM latency was 77.5 minutes.  The patient spent 8.36% of the night in stage N1 sleep, 73.31% in stage N2 sleep, 0.00% in stage N3 and 18.33% in REM.  Alpha intrusion was absent.  Supine sleep was 90.02%.  RESPIRATORY PARAMETERS  The overall apnea/hypopnea index (AHI) was 28.5 per hour. There were 16 total apneas, including 16 obstructive, 0 central and 0 mixed apneas. There were 146 hypopneas and 72 RERAs.  The AHI during Stage REM sleep was 30.7 per hour. AHI while supine was 30.3 per hour.  The mean oxygen saturation  was 93.34%. The minimum SpO2 during sleep was 81.00%.  Moderate snoring was noted during this study. CARDIAC DATA  The 2 lead EKG demonstrated sinus rhythm. The mean heart rate was 62.19 beats per minute. Other EKG findings include: None.  LEG MOVEMENT DATA  The total PLMS were 0 with a resulting PLMS index of 0.00. Associated arousal with leg movement index was 0.0 . IMPRESSIONS  - While sleep disordered breathing improved in severity after recent weight loss, he still has persistent obstructive sleep apnea with an AHI of 28.5 and SaO2 low of 81%. DIAGNOSIS  - Obstructive Sleep Apnea (327.23 [G47.33 ICD-10]) RECOMMENDATIONS  - He should remain on CPAP therapy while continuing his weight loss efforts. [Electronically signed] 09/30/2016 01:22 AM Chesley Mires MD, ABSM  Diplomate, American Board of Sleep Medicine  NPI: [id number]

## 2016-09-30 NOTE — Telephone Encounter (Signed)
PSG 09/26/16 >> AHI 28.5, SaO2 low 81%.    Please inform him that his sleep study still shows that he has moderate sleep apnea.  This is improved compared to previous study after he recently lost weight.  However, given that his sleep study still shows moderate sleep apnea he needs to continue using CPAP.

## 2016-10-01 NOTE — Telephone Encounter (Signed)
Spoke with patient, discussed results and explained that we need a 30 days compliance report from his CPAP in order for DOT to allow him to keep license. The patient dropped off a form Friday (this was found in VS look at cubby), the form states that the patient must "turn in all [place] Driver Licenses in his possession to the [facility] prior to 12:01AM 10/05/2016 as his driving privileges are indefinitely cancelled for failure to submit medical report."   Pt wants to know if a letter can be written to the [facility] stating that his card was corrupt when we attempted to get a download recently. I advised that I would need to contact Dr Halford Chessman and see what his recommendations in this situation are. I advised the patient that if unable to get an extension for his license then he will need to turn in his license(s) to the [facility] prior to 10/05/16 and he is not allowed to drive until his license is reinstated. I told him that we will have to do the 30 day compliance check if he wants his license back and he became very defensive asking if I was saying that this is all his fault, I advised that I was not saying that anything was his fault directly, patient then started asking how he is supposed to wear his CPAP machine at night while he is in school -- he has an 8am class and need his sleep. I simply advised again that he needs to be compliant with the CPAP machine the best he can and we will go from there.   Please advise Dr Joycelyn Schmid of recommendations. Are we able to write a letter to the [facility] to possibly get this pushed out 30 days? [facility] letter is in your Lorn Butcher look at folder)  If letter is written this will need to be faxed to [phone], ATTN: Medical Review Program If we need to contact the [facility], [phone]

## 2016-10-01 NOTE — Telephone Encounter (Signed)
Samuel Mires, MD  09/30/16 1:24 AM  PSG 09/26/16 >> AHI 28.5, SaO2 low 81%.  Please inform him that his sleep study still shows that he has moderate sleep apnea.  This is improved compared to previous study after he recently lost weight.  However, given that his sleep study still shows moderate sleep apnea he needs to continue using CPAP.     Pt will need to wear his CPAP for 30 days to get a good compliance report for DOT. No exceptions! Once he wears his CPAP for 30 nights straight for 4-6hrs per night he will need to bring his CPAP card into our office to be downloaded.   LM x 3 for patient

## 2016-10-01 NOTE — Telephone Encounter (Signed)
Results have been explained to patient, pt expressed understanding. Nothing further needed.  

## 2016-10-01 NOTE — Telephone Encounter (Signed)
LM for Patient x 1  Pt will need to wear his CPAP for 30 days to get a good compliance report for DOT. No exceptions.

## 2016-10-02 MED FILL — TAMSULOSIN HCL 0.4 MG CAP: 0.4 | 30 days supply | Qty: 30 | Fill #3

## 2016-10-02 MED FILL — ACYCLOVIR 400 MG TABLET: 400 | 30 days supply | Qty: 60 | Fill #2

## 2016-10-03 ENCOUNTER — Encounter: Payer: Self-pay | Admitting: Pulmonary Disease

## 2016-10-03 ENCOUNTER — Telehealth: Payer: Self-pay

## 2016-10-03 NOTE — Telephone Encounter (Signed)
Spoke with pt and he is aware previous message was sent to VS. He states he is just concerned because they want him to turn in his license. Informed the patient we will call him back once VS responds.

## 2016-10-03 NOTE — Telephone Encounter (Signed)
VS please advise. Thanks! 

## 2016-10-03 NOTE — Telephone Encounter (Signed)
Letter faxed back to [place] - Medical Review Program.  Contacted patient and made him aware that his letter was being faxed but that it kept ringing back a busy signal. Aware that I will continue faxing but advised him that he may want to come by and pick up a copy of the letter and take it to his local DMV to see if they can give him any information on an extension or to help get his letter to the appropriate person -- deadline is Aug 11 @ 12:01AM. Received confirmation from fax that fax was received.  Pt became very frustrated because the fax would not go through, I advised that I was continuing to fax to try and get it to go through but asked again that he come by and get the letter as well. Pt ended phone line abruptly after agreeing to come get letter.  Nothing further needed.

## 2016-10-03 NOTE — Telephone Encounter (Signed)
Pt calling concerning the letter for DOT.  He will have to turn in his licence on Saturday, if he is not cleared.

## 2016-10-03 NOTE — Telephone Encounter (Signed)
Letter printed and signed.  It is in my office. 

## 2016-10-03 NOTE — Telephone Encounter (Signed)
Pt was called and informed of lab results. 

## 2016-10-03 NOTE — Telephone Encounter (Signed)
VS  Please see Ashtyn's previous message

## 2016-10-07 ENCOUNTER — Ambulatory Visit (HOSPITAL_COMMUNITY)
Admission: EM | Admit: 2016-10-07 | Discharge: 2016-10-07 | Disposition: A | Discharge status: Home / Self Care | Payer: Self-pay | Attending: Family Medicine | Admitting: Family Medicine

## 2016-10-07 ENCOUNTER — Encounter (HOSPITAL_COMMUNITY): Payer: Self-pay | Admitting: *Deleted

## 2016-10-07 DIAGNOSIS — M546 Pain in thoracic spine: Secondary | ICD-10-CM

## 2016-10-07 MED ORDER — NAPROXEN 500 MG PO TABS: 500.0000 mg | ORAL_TABLET | Freq: Two times a day (BID) | ORAL | 0 refills | Status: AC

## 2016-10-07 MED FILL — NAPROXEN 500 MG TABLET: 500 | 10 days supply | Qty: 20 | Fill #0

## 2016-10-07 NOTE — ED Triage Notes (Signed)
Pt  Reports   Earlier today  He felt   A pain  And   Pulling    sensatiion   In  His upper  Back  The  Pain   Subsided       On its own       He  Reports  The pain   Was  Worse  On  Movement            He    Ambulated  To  Room  With a  Steady  Fluid  Gait

## 2016-10-07 NOTE — Discharge Instructions (Addendum)
Taken naproxen as directed. Heat/ice compress as needed. Activity as tolerated. This can take up to 2-3 weeks to completely resolve, but he should be feeling better each week. Follow-up with PCP for further evaluation if symptoms does not resolve.
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
## 2016-10-10 MED FILL — HYDROCHLOROTHIAZIDE 25 MG T: 25 | 30 days supply | Qty: 30 | Fill #1

## 2016-10-15 ENCOUNTER — Telehealth: Payer: Self-pay | Admitting: Pulmonary Disease

## 2016-10-15 NOTE — Telephone Encounter (Signed)
Pt brought in cpap download for VS to review.  See 09/27/16 phone note- pt is still needing a letter documenting his cpap compliance for his driver's license.  Download has been placed in VS' cubby for review.    VS please advise.  Thanks.

## 2016-10-16 NOTE — Telephone Encounter (Signed)
Bipap 09/14/16 to 10/13/16 >> used on 27 of 30 nights with average 6 hrs 33 min.  Average AHI 12 with Bipap 12/8 cm H2O with air leak.   Please inform him that current download shows good compliance.  I will complete letter for DOT and inform him when this is ready to be picked up.

## 2016-10-17 NOTE — Telephone Encounter (Signed)
Spoke with pt. He is aware of his download results. Will leave message open to follow up on DOT letter.

## 2016-10-18 NOTE — Telephone Encounter (Signed)
I have completed form for patient and it is in my office.

## 2016-10-21 NOTE — Telephone Encounter (Signed)
LM x1 for patient.  

## 2016-10-21 NOTE — Telephone Encounter (Signed)
Pt aware that his letter is ready for pick up. Aware that I will fax this to the [facility] and he can pick up the originals.  Will hold in my box until this can be faxed

## 2016-10-21 NOTE — Telephone Encounter (Signed)
Pt returned phone call, pt contact # [phone]

## 2016-10-24 NOTE — Telephone Encounter (Signed)
This has been faxed several times over the the last few days, I was finally able to get this to go through 10/23/16.  Copy placed to scan. Originals up front for patient to pick up. Nothing further needed.

## 2016-10-24 NOTE — Telephone Encounter (Signed)
AG please advise once this has been faxed for the pt and we can close this message.  Thanks

## 2016-11-06 ENCOUNTER — Other Ambulatory Visit: Payer: Self-pay | Admitting: Family Medicine

## 2016-11-06 DIAGNOSIS — N400 Enlarged prostate without lower urinary tract symptoms: Secondary | ICD-10-CM

## 2016-11-06 DIAGNOSIS — I10 Essential (primary) hypertension: Secondary | ICD-10-CM

## 2016-11-06 MED FILL — HYDROCHLOROTHIAZIDE 25 MG T: 25 | 30 days supply | Qty: 30 | Fill #2

## 2016-11-07 MED FILL — ?TAMSULOSIN HCL 0.4 MG CAP: 0.4 | 30 days supply | Qty: 30 | Fill #0

## 2016-11-11 ENCOUNTER — Encounter (HOSPITAL_BASED_OUTPATIENT_CLINIC_OR_DEPARTMENT_OTHER): Payer: Self-pay

## 2016-11-14 ENCOUNTER — Other Ambulatory Visit: Payer: Self-pay | Admitting: Pharmacist

## 2016-11-14 DIAGNOSIS — I10 Essential (primary) hypertension: Secondary | ICD-10-CM

## 2016-11-14 DIAGNOSIS — N400 Enlarged prostate without lower urinary tract symptoms: Secondary | ICD-10-CM

## 2016-11-14 MED ORDER — HYDROCHLOROTHIAZIDE 25 MG PO TABS: 25.0000 mg | ORAL_TABLET | Freq: Every day | ORAL | 0 refills | Status: DC

## 2016-11-14 MED ORDER — TAMSULOSIN HCL 0.4 MG PO CAPS: 0.4000 mg | ORAL_CAPSULE | Freq: Every day | ORAL | 0 refills | Status: DC

## 2016-11-14 MED FILL — ?TAMSULOSIN HCL 0.4 MG CAP: 0.4 | 30 days supply | Qty: 30 | Fill #0

## 2016-11-14 MED FILL — HYDROCHLOROTHIAZIDE 25 MG T: 25 | 30 days supply | Qty: 30 | Fill #0

## 2016-11-15 MED FILL — ACYCLOVIR 400 MG TABLET: 400 | 30 days supply | Qty: 60 | Fill #0

## 2016-11-20 ENCOUNTER — Ambulatory Visit (INDEPENDENT_AMBULATORY_CARE_PROVIDER_SITE_OTHER): Payer: Self-pay | Admitting: Gastroenterology

## 2016-11-20 ENCOUNTER — Encounter: Payer: Self-pay | Admitting: Gastroenterology

## 2016-11-20 VITALS — BP 122/75 | HR 78 | Temp 97.2°F | Ht 68.0 in | Wt 226.6 lb

## 2016-11-20 DIAGNOSIS — K5900 Constipation, unspecified: Secondary | ICD-10-CM

## 2016-11-20 NOTE — Patient Instructions (Signed)
1. Take one capful of Miralax at bedtime on days you do not have an adequate bowel movement. Buy over the counter.  2. Great job on your weight loss efforts! 3. Return to the office as needed.

## 2016-11-20 NOTE — Progress Notes (Signed)
Primary Care Physician: System, Provider Not In  Primary Gastroenterologist:  Barney Drain, MD   Chief Complaint  Patient presents with  . Constipation    HPI: Samuel Irwin is a 56 y.o. male hereFor follow-up. Last seen December 2016. He has a history of gastritis, hemorrhoids, constipation. EGD and colonoscopy back in August 2016 showed moderate sized external and internal hemorrhoids, gastritis but no H. Pylori. He never tried MiraLAX recommended previously.  States he takes Castor oil once per week. Denies melena or rectal bleeding. No abdominal pain. Appetite is good. No heartburn. Remains on omeprazole. Takes only as needed.  He notes that he is exercising regularly. He has lost 20 pounds since February. He was congratulated on his efforts.  Current Outpatient Prescriptions  Medication Sig Dispense Refill  . acyclovir (ZOVIRAX) 400 MG tablet TAKE 1 TABLET BY MOUTH 2 TIMES DAILY. 60 tablet 2  . fluticasone (FLONASE) 50 MCG/ACT nasal spray Place 2 sprays into both nostrils daily. 16 g 6  . hydrochlorothiazide (HYDRODIURIL) 25 MG tablet Take 1 tablet (25 mg total) by mouth daily. 30 tablet 0  . ibuprofen (ADVIL,MOTRIN) 600 MG tablet Take 1 tablet (600 mg total) by mouth every 6 (six) hours as needed. 30 tablet 0  . Multiple Vitamin (MULTIVITAMIN) capsule Take 1 capsule by mouth daily.    Marland Kitchen omeprazole (PRILOSEC) 20 MG capsule Take 1 capsule (20 mg total) by mouth daily as needed (heart burn). 90 capsule 3  . tadalafil (CIALIS) 10 MG tablet Take 2 tablets daily as needed for erectile dysfunction. 30 tablet 3  . tamsulosin (FLOMAX) 0.4 MG CAPS capsule Take 1 capsule (0.4 mg total) by mouth daily. 30 capsule 0   No current facility-administered medications for this visit.     Allergies as of 11/20/2016  . (No Known Allergies)    ROS:  General: Negative for anorexia, weight loss, fever, chills, fatigue, weakness. ENT: Negative for hoarseness, difficulty swallowing ,  nasal congestion. CV: Negative for chest pain, angina, palpitations, dyspnea on exertion, peripheral edema.  Respiratory: Negative for dyspnea at rest, dyspnea on exertion, cough, sputum, wheezing.  GI: See history of present illness. GU:  Negative for dysuria, hematuria, urinary incontinence, urinary frequency, nocturnal urination.  Endo: Negative for unusual weight change.    Physical Examination:   BP 122/75   Pulse 78   Temp (!) 97.2 F (36.2 C) (Oral)   Ht 5\' 8"  (1.727 m)   Wt 226 lb 9.6 oz (102.8 kg)   BMI 34.45 kg/m   General: Well-nourished, well-developed in no acute distress.  Eyes: No icterus. Mouth: Oropharyngeal mucosa moist and pink , no lesions erythema or exudate. Lungs: Clear to auscultation bilaterally.  Heart: Regular rate and rhythm, no murmurs rubs or gallops.  Abdomen: Bowel sounds are normal, nontender, nondistended, no hepatosplenomegaly or masses, no abdominal bruits or hernia , no rebound or guarding.   Extremities: No lower extremity edema. No clubbing or deformities. Neuro: Alert and oriented x 4   Skin: Warm and dry, no jaundice.   Psych: Alert and cooperative, normal mood and affect.  Labs:  Lab Results  Component Value Date   CREATININE 1.06 09/20/2016   BUN 12 09/20/2016   NA 143 09/20/2016   K 4.1 09/20/2016   CL 102 09/20/2016   CO2 25 09/20/2016   Lab Results  Component Value Date   ALT 17 09/20/2016   AST 17 09/20/2016   ALKPHOS 65 09/20/2016   BILITOT 0.7 09/20/2016  Lab Results  Component Value Date   WBC 5.6 09/20/2016   HGB 15.5 09/20/2016   HCT 45.9 09/20/2016   MCV 91 09/20/2016   PLT 309 09/20/2016    Imaging Studies: No results found.

## 2016-11-22 NOTE — Assessment & Plan Note (Signed)
Gastritis and GERD really not an issue at this time. He's having more issues with constipation. Recommend try MiraLAX which he never tried per recommendations previously. Take one dose at bedtime on days he does not have an adequate BM. May take daily if needed. Avoid frequent use castor oil. Return to the office as needed.

## 2016-11-24 ENCOUNTER — Encounter (HOSPITAL_COMMUNITY): Payer: Self-pay

## 2016-11-24 ENCOUNTER — Emergency Department (HOSPITAL_COMMUNITY)
Admission: EM | Admit: 2016-11-24 | Discharge: 2016-11-24 | Disposition: A | Discharge status: Home / Self Care | Payer: Self-pay | Attending: Emergency Medicine | Admitting: Emergency Medicine

## 2016-11-24 DIAGNOSIS — Z79899 Other long term (current) drug therapy: Secondary | ICD-10-CM | POA: Insufficient documentation

## 2016-11-24 DIAGNOSIS — H1032 Unspecified acute conjunctivitis, left eye: Secondary | ICD-10-CM | POA: Insufficient documentation

## 2016-11-24 DIAGNOSIS — I10 Essential (primary) hypertension: Secondary | ICD-10-CM | POA: Insufficient documentation

## 2016-11-24 DIAGNOSIS — F17210 Nicotine dependence, cigarettes, uncomplicated: Secondary | ICD-10-CM | POA: Insufficient documentation

## 2016-11-24 MED ORDER — GENTAMICIN SULFATE 0.3 % OP SOLN: 1.0000 [drp] | OPHTHALMIC | 0 refills | Status: DC

## 2016-11-24 MED ORDER — FLUORESCEIN SODIUM 1 MG OP STRP
1.0000 | ORAL_STRIP | Freq: Once | OPHTHALMIC | Status: AC
  Administered 2016-11-24: 1 via OPHTHALMIC
  Filled 2016-11-24: qty 1

## 2016-11-24 MED ORDER — TETRACAINE HCL 0.5 % OP SOLN
2.0000 [drp] | Freq: Once | OPHTHALMIC | Status: AC
  Administered 2016-11-24: 2 [drp] via OPHTHALMIC
  Filled 2016-11-24: qty 4

## 2016-11-24 NOTE — ED Triage Notes (Signed)
Patient here with left eye redness and burning x 2 days after getting something in his eye, photosensitivity
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
## 2016-11-24 NOTE — Discharge Instructions (Signed)
1-2 drops into the left eye 3 times per day until 24 hours after your eye looks, and feels normal.

## 2016-11-27 ENCOUNTER — Encounter (HOSPITAL_COMMUNITY): Payer: Self-pay | Admitting: Emergency Medicine

## 2016-11-27 ENCOUNTER — Ambulatory Visit (HOSPITAL_COMMUNITY)
Admission: EM | Admit: 2016-11-27 | Discharge: 2016-11-27 | Disposition: A | Discharge status: Home / Self Care | Payer: Self-pay | Attending: Family Medicine | Admitting: Family Medicine

## 2016-11-27 DIAGNOSIS — T148XXA Other injury of unspecified body region, initial encounter: Secondary | ICD-10-CM

## 2016-11-27 DIAGNOSIS — S46812A Strain of other muscles, fascia and tendons at shoulder and upper arm level, left arm, initial encounter: Secondary | ICD-10-CM

## 2016-11-27 NOTE — ED Triage Notes (Signed)
Pt reports left upper back pain that began yesterday.  Pt states he has had pneumonia before and it feels the same.
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
## 2016-11-27 NOTE — Discharge Instructions (Signed)
Start with heat if this point in time. Gradually do gentle stretches for the first few days before he started pulling at her sizes. Started out lightly. And with fewer reps. If you develop pain then your starting to early or going too much. Take ibuprofen 600 mg every 6 hours as needed. Do not do any heavy liftingfor several days.

## 2016-12-02 MED FILL — !CIALIS 10 MG TABLET: 10 MG | 5 days supply | Qty: 10 | Fill #1

## 2016-12-10 ENCOUNTER — Encounter: Payer: Self-pay | Admitting: Physician Assistant

## 2016-12-10 ENCOUNTER — Ambulatory Visit: Payer: Self-pay | Attending: Internal Medicine | Admitting: Physician Assistant

## 2016-12-10 VITALS — BP 112/72 | HR 74 | Temp 98.4°F | Resp 18 | Ht 68.0 in | Wt 224.0 lb

## 2016-12-10 DIAGNOSIS — M79672 Pain in left foot: Secondary | ICD-10-CM

## 2016-12-10 DIAGNOSIS — R252 Cramp and spasm: Secondary | ICD-10-CM

## 2016-12-10 DIAGNOSIS — I10 Essential (primary) hypertension: Secondary | ICD-10-CM

## 2016-12-10 DIAGNOSIS — M79671 Pain in right foot: Secondary | ICD-10-CM

## 2016-12-10 DIAGNOSIS — K219 Gastro-esophageal reflux disease without esophagitis: Secondary | ICD-10-CM | POA: Insufficient documentation

## 2016-12-10 DIAGNOSIS — Z79899 Other long term (current) drug therapy: Secondary | ICD-10-CM | POA: Insufficient documentation

## 2016-12-10 DIAGNOSIS — R202 Paresthesia of skin: Secondary | ICD-10-CM

## 2016-12-10 DIAGNOSIS — B353 Tinea pedis: Secondary | ICD-10-CM | POA: Insufficient documentation

## 2016-12-10 MED ORDER — MELOXICAM 7.5 MG PO TABS: 7.5000 mg | ORAL_TABLET | Freq: Every day | ORAL | 0 refills | Status: DC

## 2016-12-10 MED ORDER — CLOTRIMAZOLE-BETAMETHASONE 1-0.05 % EX LOTN: TOPICAL_LOTION | Freq: Two times a day (BID) | CUTANEOUS | 1 refills | Status: DC

## 2016-12-10 MED FILL — CLOTRIM-BETAMETH LOT 1-.05%: 1-0.05 | 21 days supply | Qty: 30 | Fill #0

## 2016-12-10 MED FILL — MELOXICAM 7.5 MG TABLET: 7.5 | 30 days supply | Qty: 30 | Fill #0

## 2016-12-10 NOTE — Progress Notes (Signed)
Samuel Irwin, is a 56 y.o. male  CZY:[id number]  UXN:[id number]  DOB - [birth year]  Subjective:  Chief Complaint and HPI: Samuel Irwin is a 56 y.o. male here today for B foot pain and burning B feet X 2weeks.  The burning has actually stopped since he stopped taking a supplement from [place] that is supposed to improve memory.  But, he has continued to have cramping pains occasionally in the arches of his feet that are severe.  He also has thick and malformed nails that he wants to have checked in addition to cracking and painful skin bt his toes.  He also has a sharp stabbing pain in his L great toe.  This pain comes and goes.  Pain is exacerbated with swimming and weight bearing.    ROS:   Constitutional:  No f/c, No night sweats, No unexplained weight loss. EENT:  No vision changes, No blurry vision, No hearing changes. No mouth, throat, or ear problems.  Respiratory: No cough, No SOB Cardiac: No CP, no palpitations GI:  No abd pain, No N/V/D. GU: No Urinary s/sx Musculoskeletal: No joint pain Neuro: No headache, no dizziness, no motor weakness.  Skin: No rash Endocrine:  No polydipsia. No polyuria.  Psych: Denies SI/HI  No problems updated.  ALLERGIES: No Known Allergies  PAST MEDICAL HISTORY: Past Medical History:  Diagnosis Date  . Constipation   . Cough   . Generalized headaches   . GERD (gastroesophageal reflux disease)   . H/O hiatal hernia   . Heart murmur   . Herpes   . Hypertension   . Sleep apnea    uses BIPAP  . Umbilical hernia   . Weakness   . Wheezing     MEDICATIONS AT HOME: Prior to Admission medications   Medication Sig Start Date End Date Taking? Authorizing Provider  acyclovir (ZOVIRAX) 400 MG tablet TAKE 1 TABLET BY MOUTH 2 TIMES DAILY. 06/16/16  Yes Funches, Josalyn, MD  fluticasone (FLONASE) 50 MCG/ACT nasal spray Place 2 sprays into both nostrils daily. 05/04/15  Yes Funches, Josalyn, MD  gentamicin (GARAMYCIN) 0.3 % ophthalmic solution  Place 1 drop into the left eye every 4 (four) hours. 11/24/16  Yes Tanna Furry, MD  hydrochlorothiazide (HYDRODIURIL) 25 MG tablet Take 1 tablet (25 mg total) by mouth daily. 11/14/16  Yes Tresa Garter, MD  ibuprofen (ADVIL,MOTRIN) 600 MG tablet Take 1 tablet (600 mg total) by mouth every 6 (six) hours as needed. 08/26/16  Yes Caccavale, Sophia, PA-C  Multiple Vitamin (MULTIVITAMIN) capsule Take 1 capsule by mouth daily.   Yes [provider]  omeprazole (PRILOSEC) 20 MG capsule Take 1 capsule (20 mg total) by mouth daily as needed (heart burn). 11/23/15  Yes Funches, Josalyn, MD  tadalafil (CIALIS) 10 MG tablet Take 2 tablets daily as needed for erectile dysfunction. 08/29/16  Yes Funches, Josalyn, MD  tamsulosin (FLOMAX) 0.4 MG CAPS capsule Take 1 capsule (0.4 mg total) by mouth daily. 11/14/16  Yes Tresa Garter, MD  meloxicam (MOBIC) 7.5 MG tablet Take 1 tablet (7.5 mg total) by mouth daily. With food X 10 days then prn pain 12/10/16   Argentina Donovan, PA-C     Objective:  EXAM:   Vitals:   12/10/16 1447  BP: 112/72  Pulse: 74  Resp: 18  Temp: 98.4 F (36.9 C)  TempSrc: Oral  SpO2: 97%  Weight: 224 lb (101.6 kg)  Height: 5\' 8"  (1.727 m)    General appearance : A&OX3. NAD.  Non-toxic-appearing HEENT: Atraumatic and Normocephalic.  PERRLA. EOM intact.  Neck: supple, no JVD. No cervical lymphadenopathy. No thyromegaly Chest/Lungs:  Breathing-non-labored, Good air entry bilaterally, breath sounds normal without rales, rhonchi, or wheezing  CVS: S1 S2 regular, no murmurs, gallops, rubs . Extremities: Bilateral Lower Ext shows no edema, both legs are warm to touch with = pulse throughout.  B feet N-V intact w/o ay gross abnormality.  No sign of paronychia or infection/felon.  He does have some hypertrophy of his nails but not an obvious onychomycosis.  There is some dry/cracked skin with erythema bt the 3rd, 4th, 5th digits B.  No pain at MTP.  Neurology:  CN II-XII  grossly intact, Non focal.   Psych:  TP linear. J/I WNL. Normal speech. Appropriate eye contact and affect.  Skin:  No Rash  Data Review Lab Results  Component Value Date   HGBA1C 5.50 01/06/2015   HGBA1C 5.3 05/18/2013   HGBA1C 5.5 03/20/2012     Assessment & Plan   1. Pain in both feet Has tinea pedis-will cover with lotrisone - meloxicam (MOBIC) 7.5 MG tablet; Take 1 tablet (7.5 mg total) by mouth daily. With food X 10 days then prn pain  Dispense: 30 tablet; Refill: 0 - Glucose (CBG) - Ambulatory referral to Podiatry - Vitamin D, 25-hydroxy  2. Foot cramps - Ambulatory referral to Podiatry - Basic metabolic panel  3. Essential hypertension Controlled -continue current regimen.    4. Paresthesias feet B that have resolved X 1 week since stopping a supplement he was taking.  - TSH  Patient have been counseled extensively about nutrition and exercise  Return in about 2 weeks (around 12/23/2016) for keep 10/29 appt with Dr [name] to establish care.  The patient was given clear instructions to go to ER or return to medical center if symptoms don't improve, worsen or new problems develop. The patient verbalized understanding. The patient was told to call to get lab results if they haven't heard anything in the next week.     [name], PA-C [facility] [place], [place]   12/10/2016, 3:02 PMPatient ID: Samuel Irwin, male   DOB: Aug 23, 1960, 56 y.o.   MRN: [id number]

## 2016-12-11 LAB — BASIC METABOLIC PANEL
BUN/Creatinine Ratio: 10 (ref 9–20)
BUN: 10 mg/dL (ref 6–24)
CALCIUM: 10.1 mg/dL (ref 8.7–10.2)
CO2: 22 mmol/L (ref 20–29)
CREATININE: 0.99 mg/dL (ref 0.76–1.27)
Chloride: 105 mmol/L (ref 96–106)
GFR, EST AFRICAN AMERICAN: 99 mL/min/{1.73_m2} (ref >59)
GFR, EST NON AFRICAN AMERICAN: 86 mL/min/{1.73_m2} (ref >59)
Glucose: 90 mg/dL (ref 65–99)
POTASSIUM: 3.7 mmol/L (ref 3.5–5.2)
SODIUM: 143 mmol/L (ref 134–144)

## 2016-12-11 LAB — TSH: TSH: 1.33 u[IU]/mL (ref 0.450–4.500)

## 2016-12-11 LAB — VITAMIN D 25 HYDROXY (VIT D DEFICIENCY, FRACTURES): Vit D, 25-Hydroxy: 34.1 ng/mL (ref 30.0–100.0)

## 2016-12-12 ENCOUNTER — Ambulatory Visit: Payer: Self-pay | Attending: Physician Assistant

## 2016-12-12 ENCOUNTER — Telehealth: Payer: Self-pay | Admitting: *Deleted

## 2016-12-12 LAB — GLUCOSE, POCT (MANUAL RESULT ENTRY): POC Glucose: 92 mg/dl (ref 70–99)

## 2016-12-12 NOTE — Telephone Encounter (Signed)
Patient verified DOB Patient is aware of labs being normal including vitamin d , thyroid, kidney function, and minerals. Patient advised to continue with cream and pain medication until podiatry appointment is placed. No further questions at this time.

## 2016-12-12 NOTE — Telephone Encounter (Signed)
-----   Message from Argentina Donovan, [place] sent at 12/11/2016 10:27 PM EDT ----- All of your labs are normal.  There is no diabetes.  Your electrolytes such as sodium and potassium are normal.  Your kidney function is good.  Thyroid hormone and vitamin D are normal. Thanks, [name], PA-C

## 2016-12-23 ENCOUNTER — Ambulatory Visit: Payer: Self-pay | Admitting: Internal Medicine

## 2016-12-23 ENCOUNTER — Other Ambulatory Visit: Payer: Self-pay | Admitting: Internal Medicine

## 2016-12-23 DIAGNOSIS — N400 Enlarged prostate without lower urinary tract symptoms: Secondary | ICD-10-CM

## 2016-12-23 MED FILL — ?TAMSULOSIN HCL 0.4 MG CAP: 0.4 | 30 days supply | Qty: 30 | Fill #0

## 2016-12-24 ENCOUNTER — Ambulatory Visit: Payer: Self-pay | Admitting: Nurse Practitioner

## 2016-12-27 ENCOUNTER — Emergency Department (HOSPITAL_COMMUNITY)
Admission: EM | Admit: 2016-12-27 | Discharge: 2016-12-27 | Disposition: A | Discharge status: Home / Self Care | Payer: Self-pay | Attending: Emergency Medicine | Admitting: Emergency Medicine

## 2016-12-27 ENCOUNTER — Encounter (HOSPITAL_COMMUNITY): Payer: Self-pay

## 2016-12-27 ENCOUNTER — Encounter (HOSPITAL_COMMUNITY): Payer: Self-pay | Admitting: Emergency Medicine

## 2016-12-27 ENCOUNTER — Emergency Department (HOSPITAL_COMMUNITY): Payer: Self-pay

## 2016-12-27 ENCOUNTER — Ambulatory Visit (HOSPITAL_COMMUNITY)
Admission: EM | Admit: 2016-12-27 | Discharge: 2016-12-27 | Disposition: A | Discharge status: Home / Self Care | Payer: Self-pay | Attending: Family | Admitting: Family

## 2016-12-27 DIAGNOSIS — I10 Essential (primary) hypertension: Secondary | ICD-10-CM | POA: Insufficient documentation

## 2016-12-27 DIAGNOSIS — R079 Chest pain, unspecified: Secondary | ICD-10-CM | POA: Insufficient documentation

## 2016-12-27 DIAGNOSIS — Z79899 Other long term (current) drug therapy: Secondary | ICD-10-CM | POA: Insufficient documentation

## 2016-12-27 DIAGNOSIS — F17210 Nicotine dependence, cigarettes, uncomplicated: Secondary | ICD-10-CM | POA: Insufficient documentation

## 2016-12-27 LAB — BASIC METABOLIC PANEL
Anion gap: 8 (ref 5–15)
BUN: 7 mg/dL (ref 6–20)
CO2: 25 mmol/L (ref 22–32)
CREATININE: 0.99 mg/dL (ref 0.61–1.24)
Calcium: 9.9 mg/dL (ref 8.9–10.3)
Chloride: 103 mmol/L (ref 101–111)
GFR calc Af Amer: >60 mL/min (ref >60)
Glucose, Bld: 88 mg/dL (ref 65–99)
Potassium: 3.7 mmol/L (ref 3.5–5.1)
SODIUM: 136 mmol/L (ref 135–145)

## 2016-12-27 LAB — I-STAT TROPONIN, ED
TROPONIN I, POC: 0 ng/mL (ref 0.00–0.08)
Troponin i, poc: 0 ng/mL (ref 0.00–0.08)

## 2016-12-27 LAB — CBC
HCT: 44.7 % (ref 39.0–52.0)
Hemoglobin: 15 g/dL (ref 13.0–17.0)
MCH: 30.5 pg (ref 26.0–34.0)
MCHC: 33.6 g/dL (ref 30.0–36.0)
MCV: 90.9 fL (ref 78.0–100.0)
PLATELETS: 273 10*3/uL (ref 150–400)
RBC: 4.92 MIL/uL (ref 4.22–5.81)
RDW: 14.3 % (ref 11.5–15.5)
WBC: 5.7 10*3/uL (ref 4.0–10.5)

## 2016-12-27 NOTE — ED Triage Notes (Signed)
Pt endorses central chest pain that began 3 days ago while studying for school. Pt now having radiation to the mid upper back, worse with movement. VSS. NAD. Denies any other sx.

## 2016-12-27 NOTE — ED Triage Notes (Addendum)
Pt states he was having mid chest pain a few days ago after leaning forward for a long time doing homework.  He states when he sat back he had the pain in his mid chest.  The pain has now radiated to his upper back and he has intermittent sharp left chest pain.  He denies any other symptoms.  Pt is belching while speaking to me.  Pt is under a lot of stress taking care of his mother and going to school and family issues.
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
## 2016-12-27 NOTE — ED Notes (Signed)
Pt reports chest pain earlier in the week and now complains of upper back pain. Pt denies chest pain.

## 2016-12-27 NOTE — Discharge Instructions (Signed)
Please go to [facility] emergency room immediately as concerned for chest pain and need for further observation/ labs.
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
## 2016-12-27 NOTE — ED Notes (Signed)
Pt went directly to ED

## 2017-01-08 ENCOUNTER — Other Ambulatory Visit: Payer: Self-pay | Admitting: Internal Medicine

## 2017-01-08 DIAGNOSIS — I10 Essential (primary) hypertension: Secondary | ICD-10-CM

## 2017-01-08 MED FILL — ?ACYCLOVIR 400MG TABLET: 400 | 30 days supply | Qty: 60 | Fill #1

## 2017-01-09 MED FILL — HYDROCHLOROTHIAZIDE 25 MG T: 25 | 30 days supply | Qty: 30 | Fill #0

## 2017-01-14 ENCOUNTER — Other Ambulatory Visit: Payer: Self-pay | Admitting: *Deleted

## 2017-01-14 MED ORDER — TADALAFIL 10 MG PO TABS: ORAL_TABLET | ORAL | 3 refills | Status: DC

## 2017-01-14 NOTE — Telephone Encounter (Signed)
PRINTED FOR PASS PROGRAM 

## 2017-01-23 ENCOUNTER — Telehealth: Payer: Self-pay | Admitting: Pulmonary Disease
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
## 2017-01-31 NOTE — Telephone Encounter (Signed)
A letter was completed from 10/03/16 for this purpose.  If DOT is needing additional input since this letter, then he would need to bring in his SD card to get last 30 days CPAP download.  Could then update letter with more recent information.
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
## 2017-01-31 NOTE — Telephone Encounter (Signed)
ATC pt but the mailbox was full and I was unable to leave VM.

## 2017-02-05 NOTE — Telephone Encounter (Signed)
Left voice mail on machine for patient to return phone call back regarding needing an updated CPAP download in last 30-60 days.

## 2017-02-06 NOTE — Telephone Encounter (Signed)
Spoke with patient today regarding DOT letter. Per VS a DOT letter was completed on 10-03-16, anything additional is requiring a 30-60 CPAP download on the patient. Patient requested if I could call the phone number on DOT letter for him to find out additional information for the pt. Advised that the patient call himself to further find what exactly is needed for this DOT letter. He agreed, and will call back regarding what his finding are for this letter. Will follow up at later date.
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
## 2017-02-10 ENCOUNTER — Telehealth: Payer: Self-pay | Admitting: Pulmonary Disease

## 2017-02-10 NOTE — Telephone Encounter (Signed)
Message will be routed to [facility] as she is handling this issue.

## 2017-02-11 NOTE — Telephone Encounter (Signed)
Spoke with patient yesterday at 4:45pm regarding his appt at [facility] with Amy regarding a cpap SD download. They were having a hard time with fax machine working. He said to expect fax yesterday or today with download. Nothing as of this morning.  Called [facility] this morning spoke with [place] regarding of this matter. She stated patient is not compliant with CPAP machine. She will be faxing copy of the 90 day download to me today. Per Dorothyann Peng up at the front desk, she will let me know today if rec'd.  Per VS if the patient is not compliant, we will have to complete letter today with the results provided by the download. Will follow up with this matter later this afternoon, and with the patient.

## 2017-02-13 ENCOUNTER — Ambulatory Visit: Payer: Self-pay | Admitting: Internal Medicine

## 2017-02-13 ENCOUNTER — Other Ambulatory Visit: Payer: Self-pay | Admitting: Internal Medicine

## 2017-02-13 DIAGNOSIS — I10 Essential (primary) hypertension: Secondary | ICD-10-CM

## 2017-02-13 MED FILL — ACYCLOVIR 400 MG TABLET: 400 | 30 days supply | Qty: 60 | Fill #2

## 2017-02-13 MED FILL — HYDROCHLOROTHIAZIDE 25 MG T: 25 | 30 days supply | Qty: 30 | Fill #0

## 2017-02-13 NOTE — Telephone Encounter (Signed)
Kelli please advise if anything further is needed with this matter.  Thanks!

## 2017-02-13 NOTE — Telephone Encounter (Signed)
We have rec'd fax from [facility] regarding patient's download on cpap machine. Patient is not compliant, not using the cpap machine each night.   Per VS since patient is not using the machine he is unable to complete the letter to DOT due to no new current information.  Nothing further needed at this time. I will reach out to the patient later this afternoon with this information.

## 2017-02-20 ENCOUNTER — Telehealth: Payer: Self-pay | Admitting: Pulmonary Disease

## 2017-02-20 NOTE — Telephone Encounter (Signed)
  Spoke with pt for >57min relaying below information. I attempted to explain to pt that we can not fulfil his request due to being non compliant. Pt became very upset during our conversation and stated that he has a lot doing on with school and his mother. I voiced my understanding to pt and again relayed the below message. Pt then requested to speak with [facility], pt was made aware that kelli will not be back in our office until Monday. Pt states that he received a letter stating that his license will be taken on 02/27/17. After multiple attempts of explaining why VS can not fulfill this request, pt ending our call with good bye ma'am  Will route to VS to make aware.   Note    We have rec'd fax from [facility] regarding patient's download on cpap machine. Patient is not compliant, not using the cpap machine each night.   Per VS since patient is not using the machine he is unable to complete the letter to DOT due to no new current information.  Nothing further needed at this time. I will reach out to the patient later this afternoon with this information.

## 2017-02-21 NOTE — Telephone Encounter (Signed)
Information is correct.  He does not meet criteria for compliance: average 4 hours per night for greater than 70% of nights.  As such I can not complete letter to DOT stating that he is compliant.

## 2017-02-26 ENCOUNTER — Telehealth: Payer: Self-pay | Admitting: Pulmonary Disease

## 2017-02-26 NOTE — Telephone Encounter (Signed)
Spoke with patient regarding DOT letter. Per VS; pt doesn't meet criteria for compliance averaging 4 hrs or greater each night using cpap machine. VS did not complete letter to DOT due to pt not compliant as this was a requirement.  Pt stated he is very upset and frustrated because he has lost his lisence from DOT because the letter was not completed by VS because pt is NOT compliant using his cpap machine each night as this is the requirement.   Pt reports that he was advised by [facility] and [facility] that he could miss 8 nights per month using the cpap machine. Pt is frustrated that he feels he is told one thing a year ago, and now another with this new DOT letter.   Pt will be calling back today regarding new letter from DOT regarding loss of his licence due to not being compliant using his cpap machine. Advised pt to schedule appt with VS to go over the new letter from DOT and use of cpap machine compliance guidelines, and pt denied the appt.

## 2017-02-26 NOTE — Telephone Encounter (Signed)
Patient returning [facility] call, CB is [phone]

## 2017-02-26 NOTE — Telephone Encounter (Signed)
Spoke with the pt  He states he only wants to speak with [facility]  I advised she is helping another pt and asked what I could help with  He still refuses to speak with me  Will forward to [facility]

## 2017-02-27 NOTE — Telephone Encounter (Signed)
Left voice mail on machine for patient to return phone call back regarding message yesterday. X1

## 2017-03-03 NOTE — Telephone Encounter (Signed)
Routing this message to [facility] for her to follow up on.

## 2017-03-03 NOTE — Telephone Encounter (Signed)
Pt is calling back [phone]

## 2017-03-04 NOTE — Telephone Encounter (Signed)
Spoke with patient regarding DOT documentation Pt advised DOT providing extention on compliance and clearance letter Pt advised needing clearance letting from VS to advise if pt is fit to drive vehicile every 2 years Pt advised ROV scheduled for 03-25-2017 to review cpap compliance Pt was instructed from [facility] to use the cpap machine 4 hours each night, and can miss 8-10 days per month  Advised pt that we advise to use the cpap machine each night from more than 5 hours a night  Nothing further needed

## 2017-03-10 ENCOUNTER — Encounter: Payer: Self-pay | Admitting: Internal Medicine

## 2017-03-10 ENCOUNTER — Ambulatory Visit: Payer: Self-pay | Attending: Internal Medicine | Admitting: Internal Medicine

## 2017-03-10 VITALS — BP 126/83 | HR 66 | Temp 98.1°F | Resp 16 | Wt 232.0 lb

## 2017-03-10 DIAGNOSIS — Z7982 Long term (current) use of aspirin: Secondary | ICD-10-CM | POA: Insufficient documentation

## 2017-03-10 DIAGNOSIS — Z6835 Body mass index (BMI) 35.0-35.9, adult: Secondary | ICD-10-CM | POA: Insufficient documentation

## 2017-03-10 DIAGNOSIS — I10 Essential (primary) hypertension: Secondary | ICD-10-CM | POA: Insufficient documentation

## 2017-03-10 DIAGNOSIS — H43391 Other vitreous opacities, right eye: Secondary | ICD-10-CM | POA: Insufficient documentation

## 2017-03-10 DIAGNOSIS — K029 Dental caries, unspecified: Secondary | ICD-10-CM | POA: Insufficient documentation

## 2017-03-10 DIAGNOSIS — K219 Gastro-esophageal reflux disease without esophagitis: Secondary | ICD-10-CM | POA: Insufficient documentation

## 2017-03-10 DIAGNOSIS — Z7689 Persons encountering health services in other specified circumstances: Secondary | ICD-10-CM | POA: Insufficient documentation

## 2017-03-10 DIAGNOSIS — E785 Hyperlipidemia, unspecified: Secondary | ICD-10-CM | POA: Insufficient documentation

## 2017-03-10 DIAGNOSIS — N529 Male erectile dysfunction, unspecified: Secondary | ICD-10-CM | POA: Insufficient documentation

## 2017-03-10 DIAGNOSIS — Z79899 Other long term (current) drug therapy: Secondary | ICD-10-CM | POA: Insufficient documentation

## 2017-03-10 DIAGNOSIS — N400 Enlarged prostate without lower urinary tract symptoms: Secondary | ICD-10-CM | POA: Insufficient documentation

## 2017-03-10 DIAGNOSIS — E669 Obesity, unspecified: Secondary | ICD-10-CM

## 2017-03-10 DIAGNOSIS — F17210 Nicotine dependence, cigarettes, uncomplicated: Secondary | ICD-10-CM | POA: Insufficient documentation

## 2017-03-10 DIAGNOSIS — G4733 Obstructive sleep apnea (adult) (pediatric): Secondary | ICD-10-CM | POA: Insufficient documentation

## 2017-03-10 MED ORDER — OMEPRAZOLE 20 MG PO CPDR
20.0000 mg | DELAYED_RELEASE_CAPSULE | Freq: Every day | ORAL | 3 refills | Status: DC | PRN
Start: 2017-03-10 — End: 2017-10-09

## 2017-03-10 MED ORDER — TADALAFIL 10 MG PO TABS: ORAL_TABLET | ORAL | 3 refills | Status: DC

## 2017-03-10 MED ORDER — TAMSULOSIN HCL 0.4 MG PO CAPS: 0.4000 mg | ORAL_CAPSULE | Freq: Every day | ORAL | 6 refills | Status: DC

## 2017-03-10 MED FILL — ?OMEPRAZOLE DR 20MG CAPSULE: 20 | 30 days supply | Qty: 30 | Fill #0

## 2017-03-10 MED FILL — ?TAMSULOSIN HCL 0.4 MG CAP: 0.4 | 30 days supply | Qty: 30 | Fill #0

## 2017-03-10 NOTE — Progress Notes (Signed)
Pt states his right knee has been giving him issues Pt states he has been having sharp pains  Pt is needing a rx flomax
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
## 2017-03-11 MED FILL — $CIALIS 10MG TABLET: 10 | 30 days supply | Qty: 15 | Fill #0

## 2017-03-19 ENCOUNTER — Other Ambulatory Visit: Payer: Self-pay | Admitting: Internal Medicine

## 2017-03-19 DIAGNOSIS — I10 Essential (primary) hypertension: Secondary | ICD-10-CM

## 2017-03-19 MED FILL — HYDROCHLOROTHIAZIDE 25 MG T: 25 | 30 days supply | Qty: 30 | Fill #0

## 2017-03-25 ENCOUNTER — Ambulatory Visit: Payer: Self-pay | Admitting: Pulmonary Disease

## 2017-03-31 ENCOUNTER — Encounter: Payer: Self-pay | Admitting: Adult Health

## 2017-04-02 ENCOUNTER — Ambulatory Visit: Payer: Self-pay | Admitting: Pulmonary Disease

## 2017-04-04 ENCOUNTER — Encounter: Payer: Self-pay | Admitting: Adult Health

## 2017-04-04 ENCOUNTER — Ambulatory Visit (INDEPENDENT_AMBULATORY_CARE_PROVIDER_SITE_OTHER): Payer: Self-pay | Admitting: Adult Health

## 2017-04-04 VITALS — BP 130/86 | HR 78 | Ht 68.0 in | Wt 228.6 lb

## 2017-04-04 DIAGNOSIS — G4733 Obstructive sleep apnea (adult) (pediatric): Secondary | ICD-10-CM

## 2017-04-04 NOTE — Assessment & Plan Note (Signed)
Good compliance  Adjust pressures to decrease events   Plan  Patient Instructions  Continue on BIPAP At bedtime   Adjust BIPAP setting to IPAP 14 and EPAP 10  Download in 6 weeks  DOT letter completed Do not drive if sleepy  Good job with weight loss  Follow up with Dr. Halford Chessman  In 6 months and As needed

## 2017-04-04 NOTE — Patient Instructions (Signed)
Continue on BIPAP At bedtime   Adjust BIPAP setting to IPAP 14 and EPAP 10  Download in 6 weeks  DOT letter completed Do not drive if sleepy  Good job with weight loss  Follow up with Dr. Halford Chessman  In 6 months and As needed

## 2017-04-04 NOTE — Assessment & Plan Note (Signed)
Wt loss  

## 2017-04-04 NOTE — Progress Notes (Signed)
@Patient  ID: Samuel Irwin, male    DOB: 11-04-1960, 57 y.o.   MRN: [id number]  Chief Complaint  Patient presents with  . Follow-up    OSA     Referring provider: Ladell Pier, MD  HPI: 57 year old male followed for obstructive sleep apnea  TEST  PSG 09/26/16 >> AHI 28.5, SaO2 low 81%.   04/04/2017 Follow up : OSA Patient presents for a follow-up for sleep apnea.  Patient has known moderate sleep apnea.  Patient has been on BIPAP .  He had a repeat sleep study done August 2018 that showed persistent sleep apnea with a in the moderate range with AHI 28.5.  SaO2 low of 81%.  Patient was recommended to continue on his BiPAP.  Patient says he is wearing his BiPAP each night.   He denies any significant daytime sleepiness. Download shows excellent compliance with 97% usage.  Patient is wearing his sheen each night at 6.5 hours.  AHI is 10.  Patient is on BiPAP, IPAP 12 cm H2O, EPAP 8 cm H2O. Working on weight loss. Exercising . Down 40lbs.  In school .  Has DOT  for his private vehicle for a CPAP compliance report.    No Known Allergies  Immunization History  Administered Date(s) Administered  . Influenza Split 11/25/2016  . Influenza,inj,Quad PF,6+ Mos 12/22/2013, 01/06/2015, 10/19/2015  . Pneumococcal Polysaccharide-23 12/22/2013  . Tdap 09/19/2015    Past Medical History:  Diagnosis Date  . Constipation   . Cough   . Generalized headaches   . GERD (gastroesophageal reflux disease)   . H/O hiatal hernia   . Heart murmur   . Herpes   . Hypertension   . Sleep apnea    uses BIPAP  . Umbilical hernia   . Weakness   . Wheezing     Tobacco History: Social History   Tobacco Use  Smoking Status Current Some Day Smoker  . Packs/day: 0.25  . Years: 15.00  . Pack years: 3.75  . Types: Cigarettes  Smokeless Tobacco Never Used  Tobacco Comment   occas still smokes when he drinks   Ready to quit: No Counseling given: Yes Comment: occas still smokes when  he drinks   Outpatient Encounter Medications as of 04/04/2017  Medication Sig  . acyclovir (ZOVIRAX) 400 MG tablet TAKE 1 TABLET BY MOUTH 2 TIMES DAILY.  Marland Kitchen aspirin EC 81 MG tablet Take 81 mg by mouth daily.  . clotrimazole-betamethasone (LOTRISONE) lotion Apply topically 2 (two) times daily. To B feet between toes for 3 weeks  . fluticasone (FLONASE) 50 MCG/ACT nasal spray Place 2 sprays into both nostrils daily.  . hydrochlorothiazide (HYDRODIURIL) 25 MG tablet TAKE 1 TABLET BY MOUTH DAILY.  Marland Kitchen ibuprofen (ADVIL,MOTRIN) 600 MG tablet Take 1 tablet (600 mg total) by mouth every 6 (six) hours as needed.  . Multiple Vitamin (MULTIVITAMIN) capsule Take 1 capsule by mouth daily.  Marland Kitchen omeprazole (PRILOSEC) 20 MG capsule Take 1 capsule (20 mg total) by mouth daily as needed (heart burn).  . tadalafil (CIALIS) 10 MG tablet Take 2 tablets daily as needed for erectile dysfunction.  . tamsulosin (FLOMAX) 0.4 MG CAPS capsule Take 1 capsule (0.4 mg total) by mouth daily.  . [DISCONTINUED] meloxicam (MOBIC) 7.5 MG tablet Take 1 tablet (7.5 mg total) by mouth daily. With food X 10 days then prn pain   No facility-administered encounter medications on file as of 04/04/2017.      Review of Systems  Constitutional:  No  weight loss, night sweats,  Fevers, chills, fatigue, or  lassitude.  HEENT:   No headaches,  Difficulty swallowing,  Tooth/dental problems, or  Sore throat,                No sneezing, itching, ear ache, nasal congestion, post nasal drip,   CV:  No chest pain,  Orthopnea, PND, swelling in lower extremities, anasarca, dizziness, palpitations, syncope.   GI  No heartburn, indigestion, abdominal pain, nausea, vomiting, diarrhea, change in bowel habits, loss of appetite, bloody stools.   Resp: No shortness of breath with exertion or at rest.  No excess mucus, no productive cough,  No non-productive cough,  No coughing up of blood.  No change in color of mucus.  No wheezing.  No chest wall  deformity  Skin: no rash or lesions.  GU: no dysuria, change in color of urine, no urgency or frequency.  No flank pain, no hematuria   MS:  No joint pain or swelling.  No decreased range of motion.  No back pain.    Physical Exam  BP 130/86 (BP Location: Right Arm, Cuff Size: Normal)   Pulse 78   Ht 5\' 8"  (1.727 m)   Wt 228 lb 9.6 oz (103.7 kg)   SpO2 97%   BMI 34.76 kg/m   GEN: A/Ox3; pleasant , NAD, obese   HEENT:  Laurie/AT,  EACs-clear, TMs-wnl, NOSE-clear, THROAT-clear, no lesions, no postnasal drip or exudate noted.  Class 2-3 MP airway   NECK:  Supple w/ fair ROM; no JVD; normal carotid impulses w/o bruits; no thyromegaly or nodules palpated; no lymphadenopathy.    RESP  Clear  P & A; w/o, wheezes/ rales/ or rhonchi. no accessory muscle use, no dullness to percussion  CARD:  RRR, no m/r/g, no peripheral edema, pulses intact, no cyanosis or clubbing.  GI:   Soft & nt; nml bowel sounds; no organomegaly or masses detected.   Musco: Warm bil, no deformities or joint swelling noted.   Neuro: alert, no focal deficits noted.    Skin: Warm, no lesions or rashes    Lab Results:  CBC    Component Value Date/Time   WBC 5.7 12/27/2016 1424   RBC 4.92 12/27/2016 1424   HGB 15.0 12/27/2016 1424   HGB 15.5 09/20/2016 1223   HCT 44.7 12/27/2016 1424   HCT 45.9 09/20/2016 1223   PLT 273 12/27/2016 1424   PLT 309 09/20/2016 1223   MCV 90.9 12/27/2016 1424   MCV 91 09/20/2016 1223   MCH 30.5 12/27/2016 1424   MCHC 33.6 12/27/2016 1424   RDW 14.3 12/27/2016 1424   RDW 14.4 09/20/2016 1223   LYMPHSABS 1.9 04/30/2014 1246   MONOABS 1.9 (H) 04/30/2014 1246   EOSABS 0.0 04/30/2014 1246   BASOSABS 0.0 04/30/2014 1246    BMET    Component Value Date/Time   NA 136 12/27/2016 1424   NA 143 12/10/2016 1518   K 3.7 12/27/2016 1424   CL 103 12/27/2016 1424   CO2 25 12/27/2016 1424   GLUCOSE 88 12/27/2016 1424   BUN 7 12/27/2016 1424   BUN 10 12/10/2016 1518    CREATININE 0.99 12/27/2016 1424   CREATININE 1.09 09/19/2015 1242   CALCIUM 9.9 12/27/2016 1424   GFRNONAA >60 12/27/2016 1424   GFRNONAA 77 09/19/2015 1242   GFRAA >60 12/27/2016 1424   GFRAA 89 09/19/2015 1242    BNP No results found for: BNP  ProBNP No results found for: PROBNP  Imaging: No results found.   Assessment & Plan:   OSA (obstructive sleep apnea) Good compliance  Adjust pressures to decrease events   Plan  Patient Instructions  Continue on BIPAP At bedtime   Adjust BIPAP setting to IPAP 14 and EPAP 10  Download in 6 weeks  DOT letter completed Do not drive if sleepy  Good job with weight loss  Follow up with Dr. Halford Chessman  In 6 months and As needed       Morbid obesity ([place]) Wt loss      Rexene Edison, NP 04/04/2017

## 2017-04-05 NOTE — Progress Notes (Signed)
Reviewed and agree with assessment/plan.   Roc Streett, MD [facility] 02/21/2016, 12:24 PM Pager:  [phone]  

## 2017-04-08 MED FILL — $CIALIS 10MG TABLET: 10 | 30 days supply | Qty: 15 | Fill #1

## 2017-04-08 MED FILL — ?TAMSULOSIN HCL 0.4 MG CAP: 0.4 | 30 days supply | Qty: 30 | Fill #1

## 2017-04-08 MED FILL — ?OMEPRAZOLE 20 MG CPDR: 20 | 30 days supply | Qty: 30 | Fill #1

## 2017-04-09 ENCOUNTER — Telehealth: Payer: Self-pay | Admitting: Pharmacist

## 2017-04-09 MED ORDER — ACYCLOVIR 400 MG PO TABS: 400.0000 mg | ORAL_TABLET | Freq: Two times a day (BID) | ORAL | 2 refills | Status: DC

## 2017-04-09 NOTE — Addendum Note (Signed)
Addended by: Karle Plumber B on: 04/09/2017 05:44 PM   Modules accepted: Orders

## 2017-04-09 NOTE — Telephone Encounter (Signed)
Received fax from [facility] - patient requesting refill for acyclovir. Last prescribed by Dr. Adrian Blackwater, will need approval by Dr. [name]. Will forward to her.

## 2017-04-09 NOTE — Telephone Encounter (Signed)
Herpes documented on chart.  Will RF acyclovir.

## 2017-04-10 MED FILL — ?ACYCLOVIR 400 TAB: 400 | 30 days supply | Qty: 60 | Fill #0

## 2017-04-18 ENCOUNTER — Ambulatory Visit: Payer: Self-pay

## 2017-04-21 MED FILL — HYDROCHLOROTHIAZIDE 25 MG T: 25 | 30 days supply | Qty: 30 | Fill #1

## 2017-05-07 ENCOUNTER — Other Ambulatory Visit: Payer: Self-pay

## 2017-05-07 ENCOUNTER — Encounter: Payer: Self-pay | Admitting: *Deleted

## 2017-05-07 ENCOUNTER — Ambulatory Visit: Payer: Self-pay | Attending: Internal Medicine | Admitting: Physician Assistant

## 2017-05-07 VITALS — BP 113/69 | HR 70 | Temp 98.7°F | Resp 18 | Ht 68.0 in | Wt 233.0 lb

## 2017-05-07 DIAGNOSIS — R079 Chest pain, unspecified: Secondary | ICD-10-CM | POA: Insufficient documentation

## 2017-05-07 DIAGNOSIS — Z7982 Long term (current) use of aspirin: Secondary | ICD-10-CM | POA: Insufficient documentation

## 2017-05-07 DIAGNOSIS — Z79899 Other long term (current) drug therapy: Secondary | ICD-10-CM | POA: Insufficient documentation

## 2017-05-07 DIAGNOSIS — I10 Essential (primary) hypertension: Secondary | ICD-10-CM | POA: Insufficient documentation

## 2017-05-07 DIAGNOSIS — G4730 Sleep apnea, unspecified: Secondary | ICD-10-CM | POA: Insufficient documentation

## 2017-05-07 DIAGNOSIS — R5383 Other fatigue: Secondary | ICD-10-CM | POA: Insufficient documentation

## 2017-05-07 DIAGNOSIS — K219 Gastro-esophageal reflux disease without esophagitis: Secondary | ICD-10-CM | POA: Insufficient documentation

## 2017-05-07 NOTE — Progress Notes (Signed)
Patient ID: Samuel Irwin, male   DOB: March 07, 1960, 57 y.o.   MRN: [id number]      Samuel Irwin, is a 57 y.o. male  ELF:[id number]  HEN:[id number]  DOB - 13-May-1960  Subjective:  Chief Complaint and HPI: Samuel Irwin is a 57 y.o. male here today for several issues:  1)chest pain at times when he is swimming, laying down, or at other random times.  Goes to the gym about 3 times/week.  Doesn't always have CP when working out.  No SOB.  Chest pain feels sharp and fleeting/like cramping.  No paresthesias/weakness/radiating pain or diaphoresis.  Denies drug use.  Smokes 1-2 cigs/day. Drinks alcohol occasionally.  has been to ED multiple times for CP.   2) c/o 1 month h/o of fatigue.  3)wants "Cancer screen"-his gf has some sort of chest/throat CA which has him concerned.  It has been a while since he had a physical.    Social History:  [employer], has a gf  ROS:   Constitutional:  No f/c, No night sweats, No unexplained weight loss. EENT:  No vision changes, No blurry vision, No hearing changes. No mouth, throat, or ear problems.  Respiratory: No cough, No SOB Cardiac: + CP, no palpitations GI:  No abd pain, No N/V/D. GU: No Urinary s/sx Musculoskeletal: No joint pain Neuro: No headache, no dizziness, no motor weakness.  Skin: No rash Endocrine:  No polydipsia. No polyuria.  Psych: Denies SI/HI  No problems updated.  ALLERGIES: No Known Allergies  PAST MEDICAL HISTORY: Past Medical History:  Diagnosis Date  . Constipation   . Cough   . Generalized headaches   . GERD (gastroesophageal reflux disease)   . H/O hiatal hernia   . Heart murmur   . Herpes   . Hypertension   . Sleep apnea    uses BIPAP  . Umbilical hernia   . Weakness   . Wheezing     MEDICATIONS AT HOME: Prior to Admission medications   Medication Sig Start Date End Date Taking? Authorizing Provider  acyclovir (ZOVIRAX) 400 MG tablet Take 1 tablet (400 mg total) by mouth 2  (two) times daily. 04/09/17  Yes Ladell Pier, MD  aspirin EC 81 MG tablet Take 81 mg by mouth daily.   Yes [provider]  clotrimazole-betamethasone (LOTRISONE) lotion Apply topically 2 (two) times daily. To B feet between toes for 3 weeks 12/10/16  Yes [name] M, PA-C  fluticasone [facility]) 50 MCG/ACT nasal spray Place 2 sprays into both nostrils daily. 05/04/15  Yes Funches, Josalyn, MD  hydrochlorothiazide (HYDRODIURIL) 25 MG tablet TAKE 1 TABLET BY MOUTH DAILY. 03/19/17  Yes Ladell Pier, MD  ibuprofen (ADVIL,MOTRIN) 600 MG tablet Take 1 tablet (600 mg total) by mouth every 6 (six) hours as needed. 08/26/16  Yes Caccavale, Sophia, PA-C  Multiple Vitamin (MULTIVITAMIN) capsule Take 1 capsule by mouth daily.   Yes [provider]  omeprazole (PRILOSEC) 20 MG capsule Take 1 capsule (20 mg total) by mouth daily as needed (heart burn). 03/10/17  Yes Ladell Pier, MD  tadalafil (CIALIS) 10 MG tablet Take 2 tablets daily as needed for erectile dysfunction. 03/10/17  Yes Ladell Pier, MD  tamsulosin (FLOMAX) 0.4 MG CAPS capsule Take 1 capsule (0.4 mg total) by mouth daily. 03/10/17  Yes Ladell Pier, MD     Objective:  EXAM:   Vitals:   05/07/17 1142  BP: 113/69  Pulse: 70  Resp: 18  Temp: 98.7  F (37.1 C)  TempSrc: Oral  SpO2: 97%  Weight: 233 lb (105.7 kg)  Height: 5\' 8"  (1.727 m)    General appearance : A&OX3. NAD. Non-toxic-appearing HEENT: Atraumatic and Normocephalic.  PERRLA. EOM intact.   Neck: supple, no JVD. No cervical lymphadenopathy. No thyromegaly Chest/Lungs:  Breathing-non-labored, Good air entry bilaterally, breath sounds normal without rales, rhonchi, or wheezing  CVS: S1 S2 regular, no murmurs, gallops, rubs  Extremities: Bilateral Lower Ext shows no edema, both legs are warm to touch with = pulse throughout Neurology:  CN II-XII grossly intact, Non focal.   Psych:  TP linear. J/I WNL. Normal speech. Appropriate eye  contact and affect.  Skin:  No Rash  Data Review Lab Results  Component Value Date   HGBA1C 5.50 01/06/2015   HGBA1C 5.3 05/18/2013   HGBA1C 5.5 03/20/2012     Assessment & Plan   1. Chest pain, unspecified type EKG with NSR and no ST changes.  Chest pain does not sound cardiac in origin.  I have counseled him on smoking cessation, healthy diet, and exercise to further reduce cardiac risk as well as counseling him to call 911 should he develop worsening/severe CP  2. Essential hypertension Controlled.  Continue current regimen.  3. Fatigue, unspecified type Healthy diet, adequate water intake, exercise, and adequate sleep encouraged.    Patient have been counseled extensively about nutrition and exercise  Return in about 6 weeks (around 06/18/2017) for assign PCP; schedule CPE with fasting bloodwork.  The patient was given clear instructions to go to ER or return to medical center if symptoms don't improve, worsen or new problems develop. The patient verbalized understanding. The patient was told to call to get lab results if they haven't heard anything in the next week.     [name], PA-C [facility] and [facility] [place], [place]   05/07/2017, 11:53 AM

## 2017-05-08 ENCOUNTER — Other Ambulatory Visit: Payer: Self-pay | Admitting: Physician Assistant

## 2017-05-08 ENCOUNTER — Telehealth: Payer: Self-pay | Admitting: *Deleted

## 2017-05-08 DIAGNOSIS — E559 Vitamin D deficiency, unspecified: Secondary | ICD-10-CM

## 2017-05-08 LAB — VITAMIN D 25 HYDROXY (VIT D DEFICIENCY, FRACTURES): VIT D 25 HYDROXY: 23.7 ng/mL — AB (ref 30.0–100.0)

## 2017-05-08 LAB — TSH: TSH: 1.07 u[IU]/mL (ref 0.450–4.500)

## 2017-05-08 MED ORDER — VITAMIN D (ERGOCALCIFEROL) 1.25 MG (50000 UNIT) PO CAPS: 50000.0000 [IU] | ORAL_CAPSULE | ORAL | 0 refills | Status: DC

## 2017-05-08 MED FILL — VIT D2 1.25 MG (50,000 UNIT: 1.25 MG | 28 days supply | Qty: 4 | Fill #0

## 2017-05-08 NOTE — Telephone Encounter (Signed)
-----   Message from Argentina Donovan, [place] sent at 05/08/2017 10:34 AM EDT ----- Your  vitamin D is low.  This can contribute to muscle aches, anxiety, fatigue, and depression.  I have sent a prescription to our pharmacy for you to take once a week.  We will recheck this level in 3-4 months.  Your thyroid blood work was normal.  Follow up as planned.   Thanks, [name], PA-C

## 2017-05-08 NOTE — Telephone Encounter (Signed)
MA unable to reach patient due to ringing and disconnecting. !!!Please inform patient of vitamin d being low which can cause muscle aches and fatigue. Patient will take vitamin d once a week and have this level rechecked in 3-4 months. Patients thyroid is normal!!!

## 2017-05-13 ENCOUNTER — Telehealth: Payer: Self-pay | Admitting: Internal Medicine

## 2017-05-13 ENCOUNTER — Encounter: Payer: Self-pay | Admitting: Internal Medicine

## 2017-05-13 NOTE — Telephone Encounter (Signed)
Will forward to pcp

## 2017-05-13 NOTE — Telephone Encounter (Signed)
Patient called and had a question regarding his Vitamin D. Patient was wanting to knkow what causes the vitamin d to go low. Patient stated you could message him in [place]. Please fu at your earliest convenience.

## 2017-05-13 NOTE — Telephone Encounter (Signed)
Message sent to patient via mychart

## 2017-05-20 MED FILL — ?ACYCLOVIR 400 TAB: 400 | 30 days supply | Qty: 60 | Fill #1

## 2017-05-20 MED FILL — ?TAMSULOSIN HCL 0.4 MG CAP: 0.4 | 30 days supply | Qty: 30 | Fill #2

## 2017-05-20 MED FILL — HYDROCHLOROTHIAZIDE 25 MG T: 25 | 30 days supply | Qty: 30 | Fill #2

## 2017-05-29 ENCOUNTER — Ambulatory Visit (HOSPITAL_COMMUNITY)
Admission: RE | Admit: 2017-05-29 | Discharge: 2017-05-29 | Disposition: A | Discharge status: Home / Self Care | Payer: Self-pay | Source: Ambulatory Visit | Attending: Physician Assistant | Admitting: Physician Assistant

## 2017-05-29 ENCOUNTER — Ambulatory Visit: Payer: Self-pay | Attending: Internal Medicine | Admitting: Physician Assistant

## 2017-05-29 VITALS — BP 117/78 | HR 81 | Temp 99.2°F | Resp 16 | Ht 68.0 in | Wt 230.2 lb

## 2017-05-29 DIAGNOSIS — R109 Unspecified abdominal pain: Secondary | ICD-10-CM | POA: Insufficient documentation

## 2017-05-29 DIAGNOSIS — K219 Gastro-esophageal reflux disease without esophagitis: Secondary | ICD-10-CM | POA: Insufficient documentation

## 2017-05-29 DIAGNOSIS — Z7951 Long term (current) use of inhaled steroids: Secondary | ICD-10-CM | POA: Insufficient documentation

## 2017-05-29 DIAGNOSIS — Z79899 Other long term (current) drug therapy: Secondary | ICD-10-CM | POA: Insufficient documentation

## 2017-05-29 DIAGNOSIS — G4730 Sleep apnea, unspecified: Secondary | ICD-10-CM | POA: Insufficient documentation

## 2017-05-29 DIAGNOSIS — I10 Essential (primary) hypertension: Secondary | ICD-10-CM | POA: Insufficient documentation

## 2017-05-29 LAB — POCT URINALYSIS DIPSTICK
BILIRUBIN UA: NEGATIVE
GLUCOSE UA: NEGATIVE
KETONES UA: NEGATIVE
Leukocytes, UA: NEGATIVE
NITRITE UA: NEGATIVE
Protein, UA: NEGATIVE
SPEC GRAV UA: 1.025 (ref 1.010–1.025)
UROBILINOGEN UA: 0.2 U/dL
pH, UA: 5 (ref 5.0–8.0)

## 2017-05-29 MED ORDER — IBUPROFEN 600 MG PO TABS: 600.0000 mg | ORAL_TABLET | Freq: Four times a day (QID) | ORAL | 0 refills | Status: DC | PRN

## 2017-05-29 NOTE — Progress Notes (Signed)
Patient ID: Samuel Irwin, male   DOB: 1960-09-04, 57 y.o.   MRN: [id number]     Samuel Irwin, is a 57 y.o. male  HQI:[id number]  XLK:[id number]  DOB - 13-Oct-1960  Subjective:  Chief Complaint and HPI: Samuel Irwin is a 57 y.o. male here today C/o R flank pain on and off X 1 month.  NKI.  No dysuria/penile discharge.  No N/V/D.  No change in BM.  No change in appetite.  Occasionally has similar pain on L.  No hematuria.  Exercises regularly.  Doesn't remember pulling a muscle.     ROS:   Constitutional:  No f/c, No night sweats, No unexplained weight loss. EENT:  No vision changes, No blurry vision, No hearing changes. No mouth, throat, or ear problems.  Respiratory: No cough, No SOB Cardiac: No CP, no palpitations GI:  No abd pain, No N/V/D. + R flank pain GU: No Urinary s/sx Musculoskeletal: No joint pain Neuro: No headache, no dizziness, no motor weakness.  Skin: No rash Endocrine:  No polydipsia. No polyuria.  Psych: Denies SI/HI  No problems updated.  ALLERGIES: No Known Allergies  PAST MEDICAL HISTORY: Past Medical History:  Diagnosis Date  . Constipation   . Cough   . Generalized headaches   . GERD (gastroesophageal reflux disease)   . H/O hiatal hernia   . Heart murmur   . Herpes   . Hypertension   . Sleep apnea    uses BIPAP  . Umbilical hernia   . Weakness   . Wheezing     MEDICATIONS AT HOME: Prior to Admission medications   Medication Sig Start Date End Date Taking? Authorizing Provider  acyclovir (ZOVIRAX) 400 MG tablet Take 1 tablet (400 mg total) by mouth 2 (two) times daily. 04/09/17  Yes Ladell Pier, MD  fluticasone (FLONASE) 50 MCG/ACT nasal spray Place 2 sprays into both nostrils daily. 05/04/15  Yes Funches, Josalyn, MD  hydrochlorothiazide (HYDRODIURIL) 25 MG tablet TAKE 1 TABLET BY MOUTH DAILY. 03/19/17  Yes Ladell Pier, MD  ibuprofen (ADVIL,MOTRIN) 600 MG tablet Take 1 tablet (600 mg total) by mouth every 6 (six) hours as  needed. 08/26/16  Yes Caccavale, Sophia, PA-C  omeprazole (PRILOSEC) 20 MG capsule Take 1 capsule (20 mg total) by mouth daily as needed (heart burn). 03/10/17  Yes Ladell Pier, MD  tadalafil (CIALIS) 10 MG tablet Take 2 tablets daily as needed for erectile dysfunction. 03/10/17  Yes Ladell Pier, MD  tamsulosin (FLOMAX) 0.4 MG CAPS capsule Take 1 capsule (0.4 mg total) by mouth daily. 03/10/17  Yes Ladell Pier, MD  Vitamin D, Ergocalciferol, (DRISDOL) 50000 units CAPS capsule Take 1 capsule (50,000 Units total) by mouth every 7 (seven) days. 05/08/17  Yes Argentina Donovan, PA-C  aspirin EC 81 MG tablet Take 81 mg by mouth daily.    [provider]  clotrimazole-betamethasone (LOTRISONE) lotion Apply topically 2 (two) times daily. To B feet between toes for 3 weeks Patient not taking: Reported on 05/29/2017 12/10/16   Argentina Donovan, PA-C  Multiple Vitamin (MULTIVITAMIN) capsule Take 1 capsule by mouth daily.    [provider]     Objective:  EXAM:   Vitals:   05/29/17 1119  BP: 117/78  Pulse: 81  Resp: 16  Temp: 99.2 F (37.3 C)  TempSrc: Oral  SpO2: 96%  Weight: 230 lb 3.2 oz (104.4 kg)  Height: 5\' 8"  (1.727 m)    General appearance : A&OX3. NAD.  Non-toxic-appearing HEENT: Atraumatic and Normocephalic.  PERRLA. EOM intact.   Neck: supple, no JVD. No cervical lymphadenopathy. No thyromegaly Chest/Lungs:  Breathing-non-labored, Good air entry bilaterally, breath sounds normal without rales, rhonchi, or wheezing  CVS: S1 S2 regular, no murmurs, gallops, rubs  Abdomen: Bowel sounds present, Non tender and not distended with no gaurding, rigidity or rebound. No CVA TTP. Extremities: Bilateral Lower Ext shows no edema, both legs are warm to touch with = pulse throughout Neurology:  CN II-XII grossly intact, Non focal.   Psych:  TP linear. J/I WNL. Pressured speech. Appropriate eye contact and affect.  Skin:  No Rash  Data Review Lab Results    Component Value Date   HGBA1C 5.50 01/06/2015   HGBA1C 5.3 05/18/2013   HGBA1C 5.5 03/20/2012     Assessment & Plan   1. Right flank discomfort Likely musculoskeletal.  Increase water intake.   - Urinalysis Dipstick - Urine cytology ancillary only - DG Abd 1 View; Future - Comprehensive metabolic panel Try 600mg  ibuprofen up to 3 times daily for pain.    Patient have been counseled extensively about nutrition and exercise  Return for keep 4/30 appt with Dr [name].  The patient was given clear instructions to go to ER or return to medical center if symptoms don't improve, worsen or new problems develop. The patient verbalized understanding. The patient was told to call to get lab results if they haven't heard anything in the next week.     [name], PA-C [facility] and [facility], [place], [place]   05/29/2017, 11:49 AM

## 2017-05-29 NOTE — Patient Instructions (Signed)
Flank Pain Flank pain is pain in your side. The flank is the area of your side between your upper belly (abdomen) and your back. The pain may occur over a short period of time (acute) or may be long-term or come back often (chronic). It may be mild or very bad. Pain in this area can be caused by many different things. Follow these instructions at home:  Rest as told by your doctor.  Drink enough fluid to keep your pee (urine) clear or pale yellow.  Take over-the-counter and prescription medicines only as told by your doctor.  Keep all follow-up visits as told by your doctor. This is important. Contact a doctor if:  Medicine does not help your pain.  You have new symptoms.  Your pain gets worse.  You have a fever.  Your symptoms last longer than 2-3 days. Get help right away if:  Your tummy hurts or is swollen.  You are short of breath.  You feel sick to your stomach (nauseous) and it does not go away.  You cannot stop throwing up (vomiting).  You feel like you will pass out or you do pass out (faint).  You have blood in your pee.  You have a fever and your symptoms suddenly get worse. This information is not intended to replace advice given to you by your health care provider. Make sure you discuss any questions you have with your health care provider. Document Released: 11/21/2007 Document Revised: 11/03/2015 Document Reviewed: 11/15/2014 Elsevier Interactive Patient Education  2018 Elsevier Inc.  

## 2017-05-29 NOTE — Progress Notes (Signed)
Pt. Stated both of his both side are in pain, comes and go for 2 months.   Pt. Stated his right middle finger hurts for over a year due to carpel tunnel.

## 2017-05-30 LAB — COMPREHENSIVE METABOLIC PANEL
A/G RATIO: 1.5 (ref 1.2–2.2)
ALT: 18 IU/L (ref 0–44)
AST: 14 IU/L (ref 0–40)
Albumin: 4.8 g/dL (ref 3.5–5.5)
Alkaline Phosphatase: 72 IU/L (ref 39–117)
BILIRUBIN TOTAL: 0.5 mg/dL (ref 0.0–1.2)
BUN/Creatinine Ratio: 12 (ref 9–20)
BUN: 14 mg/dL (ref 6–24)
CHLORIDE: 102 mmol/L (ref 96–106)
CO2: 22 mmol/L (ref 20–29)
Calcium: 10 mg/dL (ref 8.7–10.2)
Creatinine, Ser: 1.2 mg/dL (ref 0.76–1.27)
GFR calc non Af Amer: 68 mL/min/{1.73_m2} (ref >59)
GFR, EST AFRICAN AMERICAN: 78 mL/min/{1.73_m2} (ref >59)
GLUCOSE: 94 mg/dL (ref 65–99)
Globulin, Total: 3.1 g/dL (ref 1.5–4.5)
POTASSIUM: 4.2 mmol/L (ref 3.5–5.2)
Sodium: 141 mmol/L (ref 134–144)
TOTAL PROTEIN: 7.9 g/dL (ref 6.0–8.5)

## 2017-05-30 LAB — URINE CYTOLOGY ANCILLARY ONLY
Chlamydia: NEGATIVE
NEISSERIA GONORRHEA: NEGATIVE
TRICH (WINDOWPATH): NEGATIVE

## 2017-06-02 LAB — URINE CYTOLOGY ANCILLARY ONLY: CANDIDA VAGINITIS: NEGATIVE

## 2017-06-03 ENCOUNTER — Telehealth: Payer: Self-pay

## 2017-06-03 NOTE — Telephone Encounter (Signed)
-----   Message from Argentina Donovan, [place] sent at 05/31/2017  4:17 PM EDT ----- Your xrays showed normal kidneys and no cysts or kidney stones. Thanks, [name], PA-C

## 2017-06-03 NOTE — Telephone Encounter (Signed)
-----   Message from Argentina Donovan, [place] sent at 05/31/2017  4:15 PM EDT ----- Your labs and urine studies are normal and do not show cause for concern.  Follow-up as planned. Thanks, [name], PA-C

## 2017-06-03 NOTE — Telephone Encounter (Signed)
CMA spoke to patient to inform on lab, urine, and X-ray results.  Patient understood.

## 2017-06-05 MED FILL — $CIALIS 10MG TABLET: 10 | 30 days supply | Qty: 15 | Fill #2

## 2017-06-05 MED FILL — VIT D2 1.25 MG (50,000 UNIT: 1.25 MG | 28 days supply | Qty: 4 | Fill #1

## 2017-06-13 IMAGING — DX DG KNEE COMPLETE 4+V*R*
3 series · 3 of 3 positions shown · non-contrast
Comparison: Tib-fib study from 12/30/2014

CLINICAL DATA: Patient's knee gives when a for the last 3 weeks. No
history of injury.

EXAM:
RIGHT KNEE - COMPLETE 4+ VIEW

[tunnel]
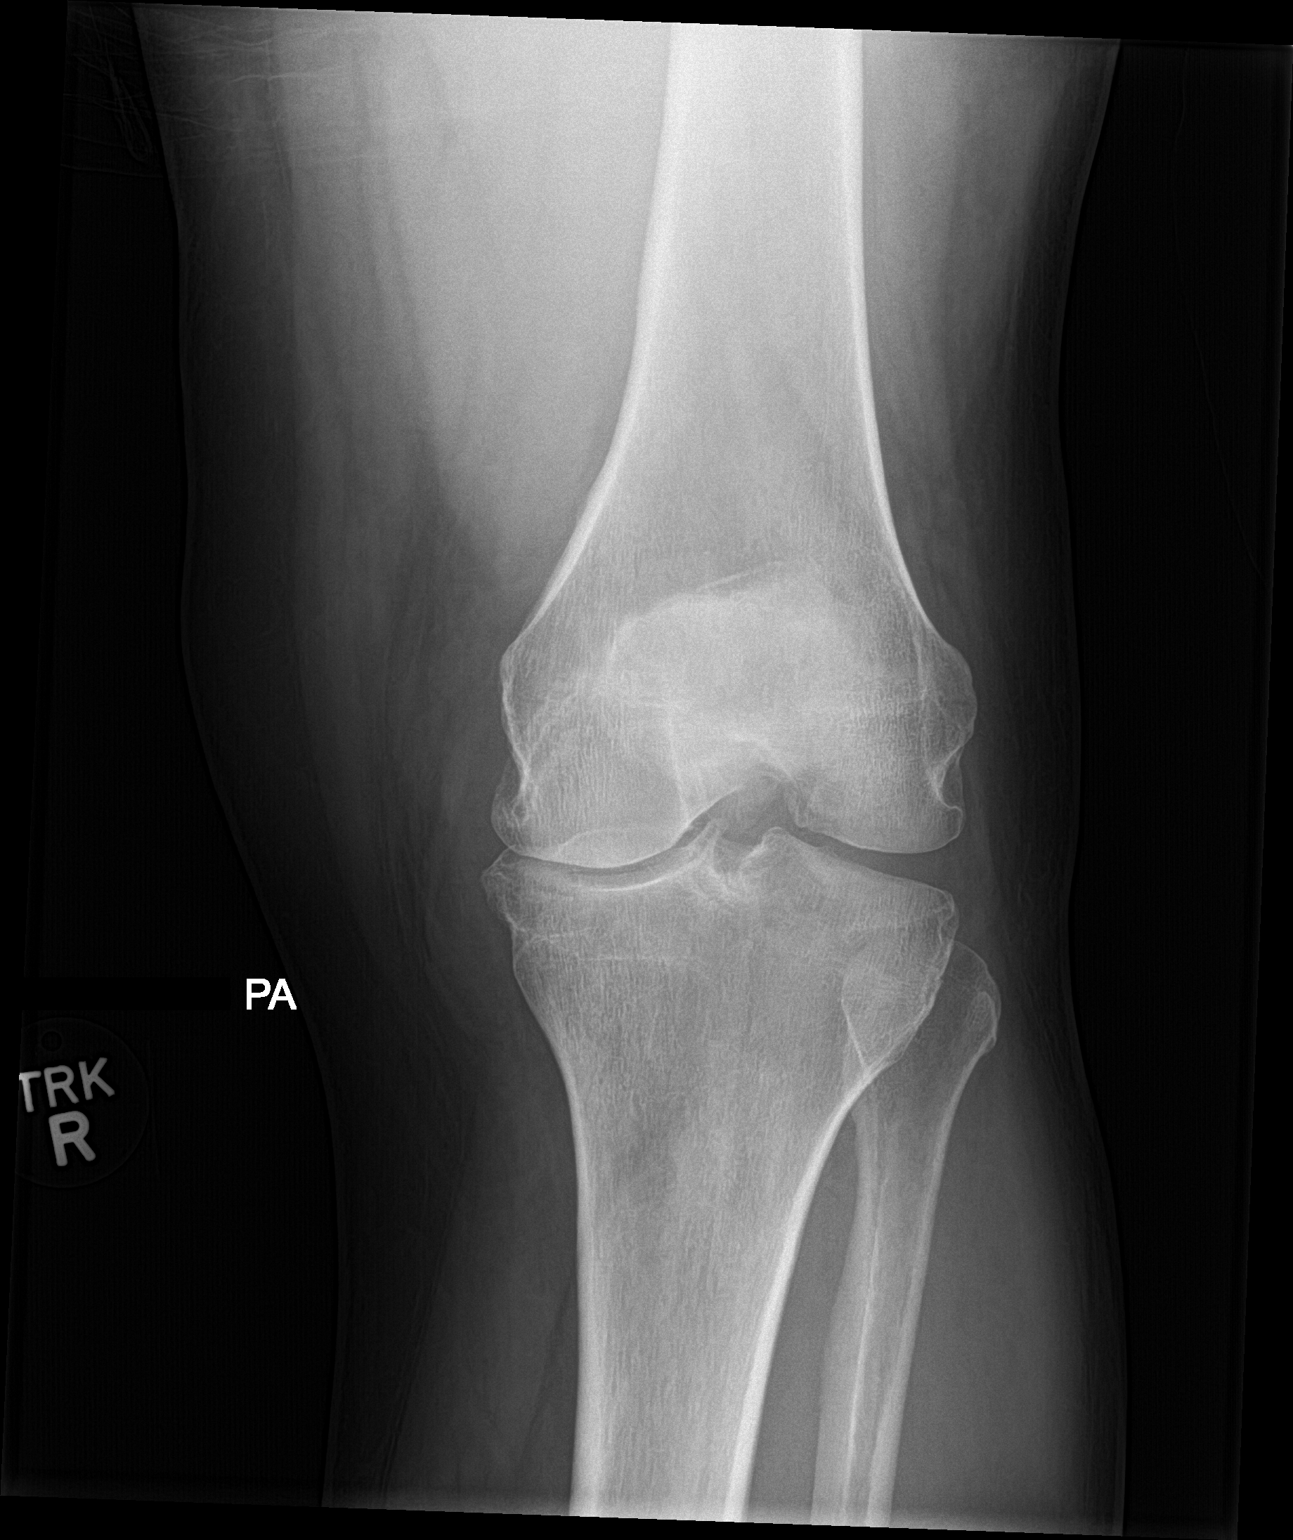

[knee lat]
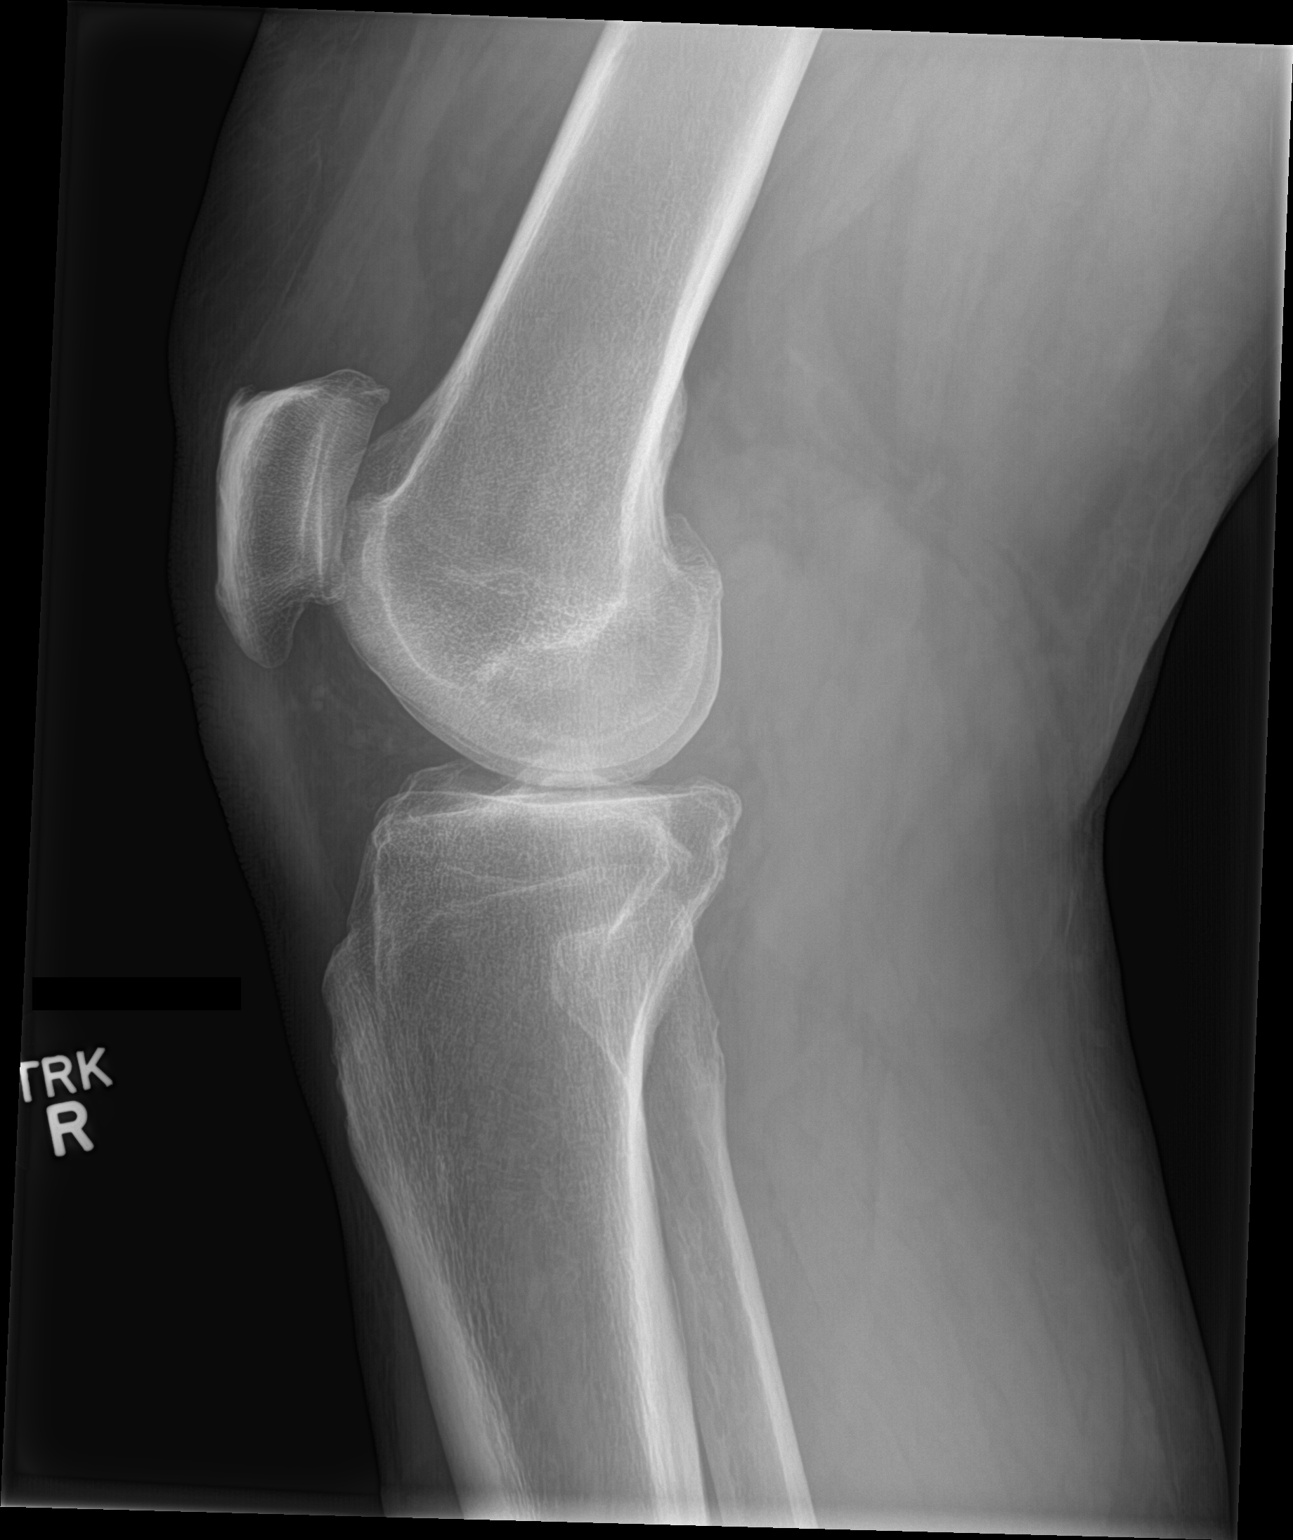

[knee sunrise]
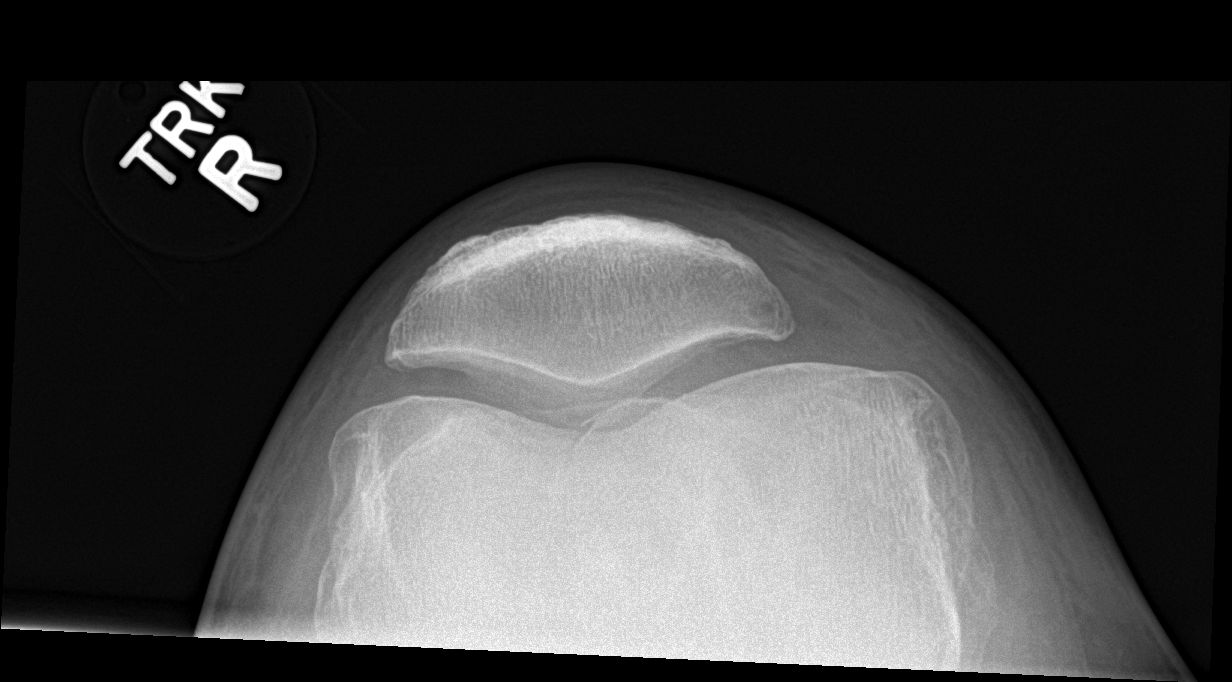

[3 of 3 positions shown; findings below may reference images not displayed]

FINDINGS: Three views study shows no fracture. No subluxation dislocation.
Loss of joint space is seen in the medial compartment. Hypertrophic
spurring is visible in all 3 compartments. No substantial joint
effusion.
IMPRESSION: Tricompartmental degenerative change without acute bony abnormality.

## 2017-06-13 IMAGING — DX DG KNEE STANDING AP BILAT
2 series · 2 of 2 positions shown · non-contrast
Comparison: Right tibia-fibula December 30, 2014

CLINICAL DATA: Bilateral knee pain.

EXAM:
BILATERAL KNEES STANDING - 1 VIEW

[knee ap bilat standing (1 of 2)]
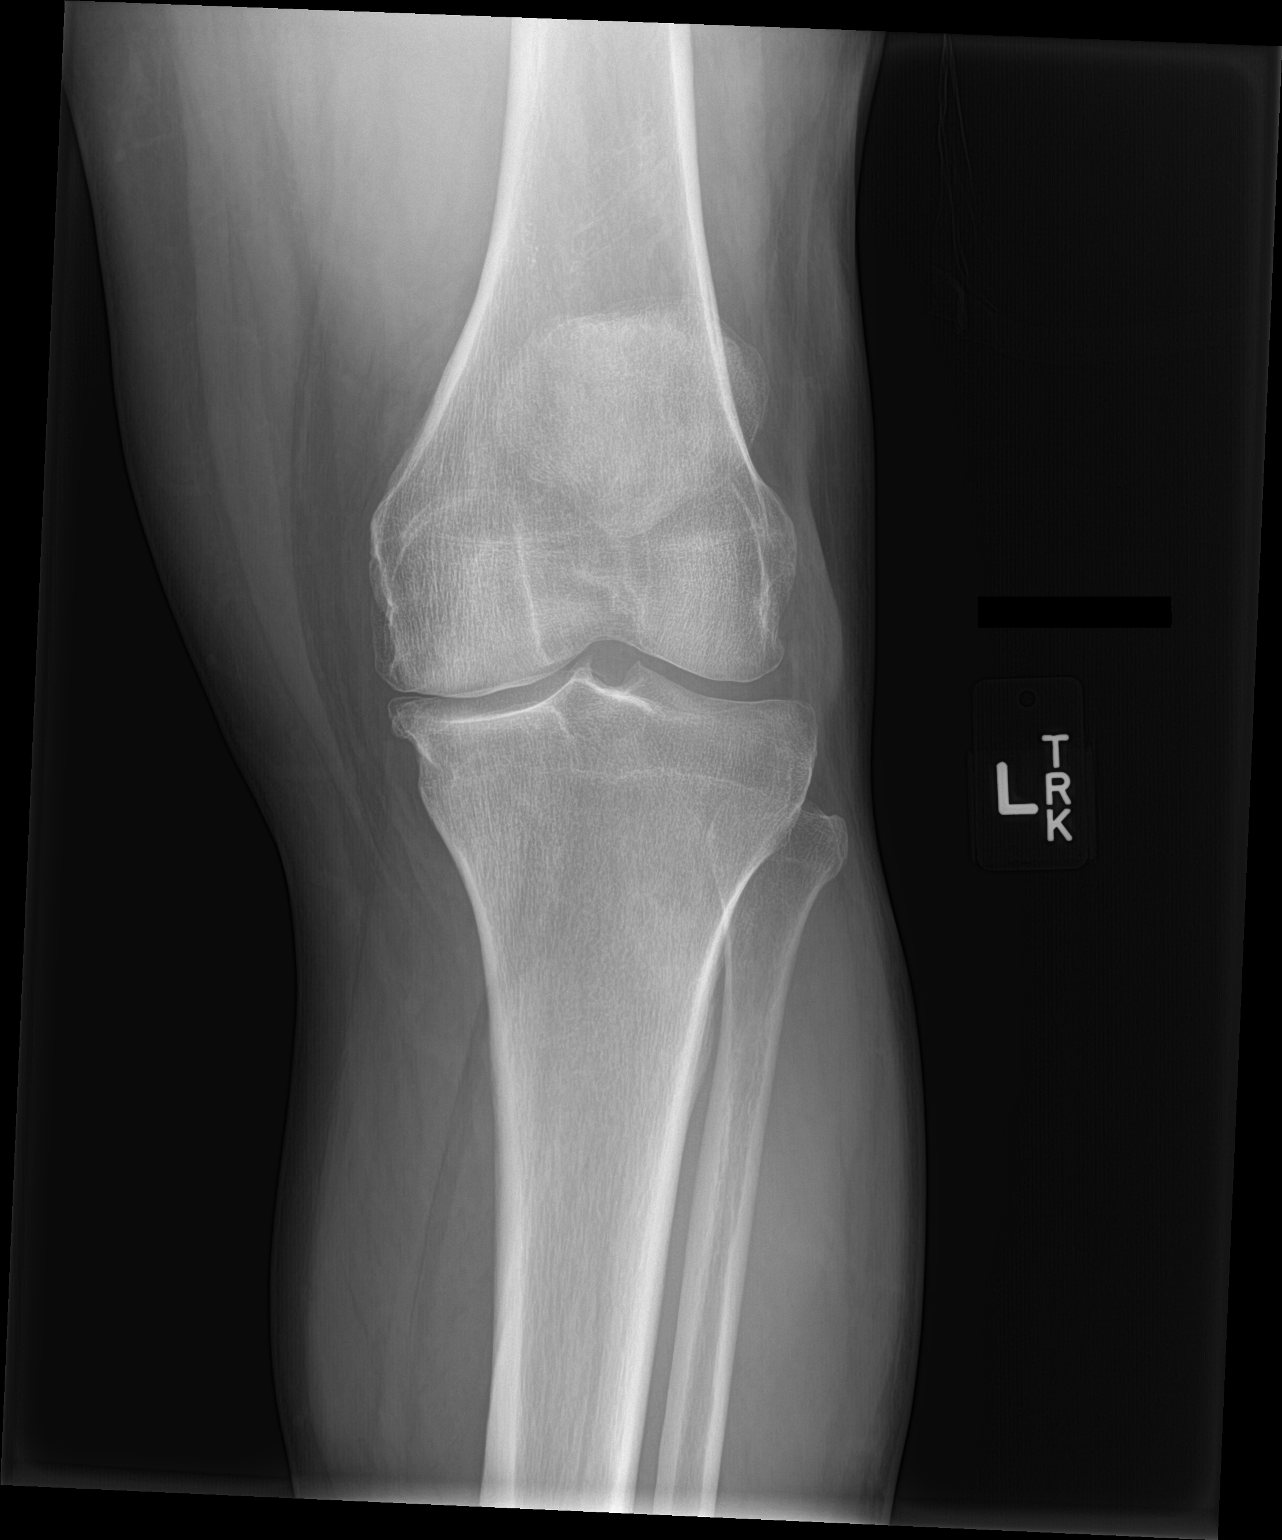

[knee ap bilat standing (2 of 2)]
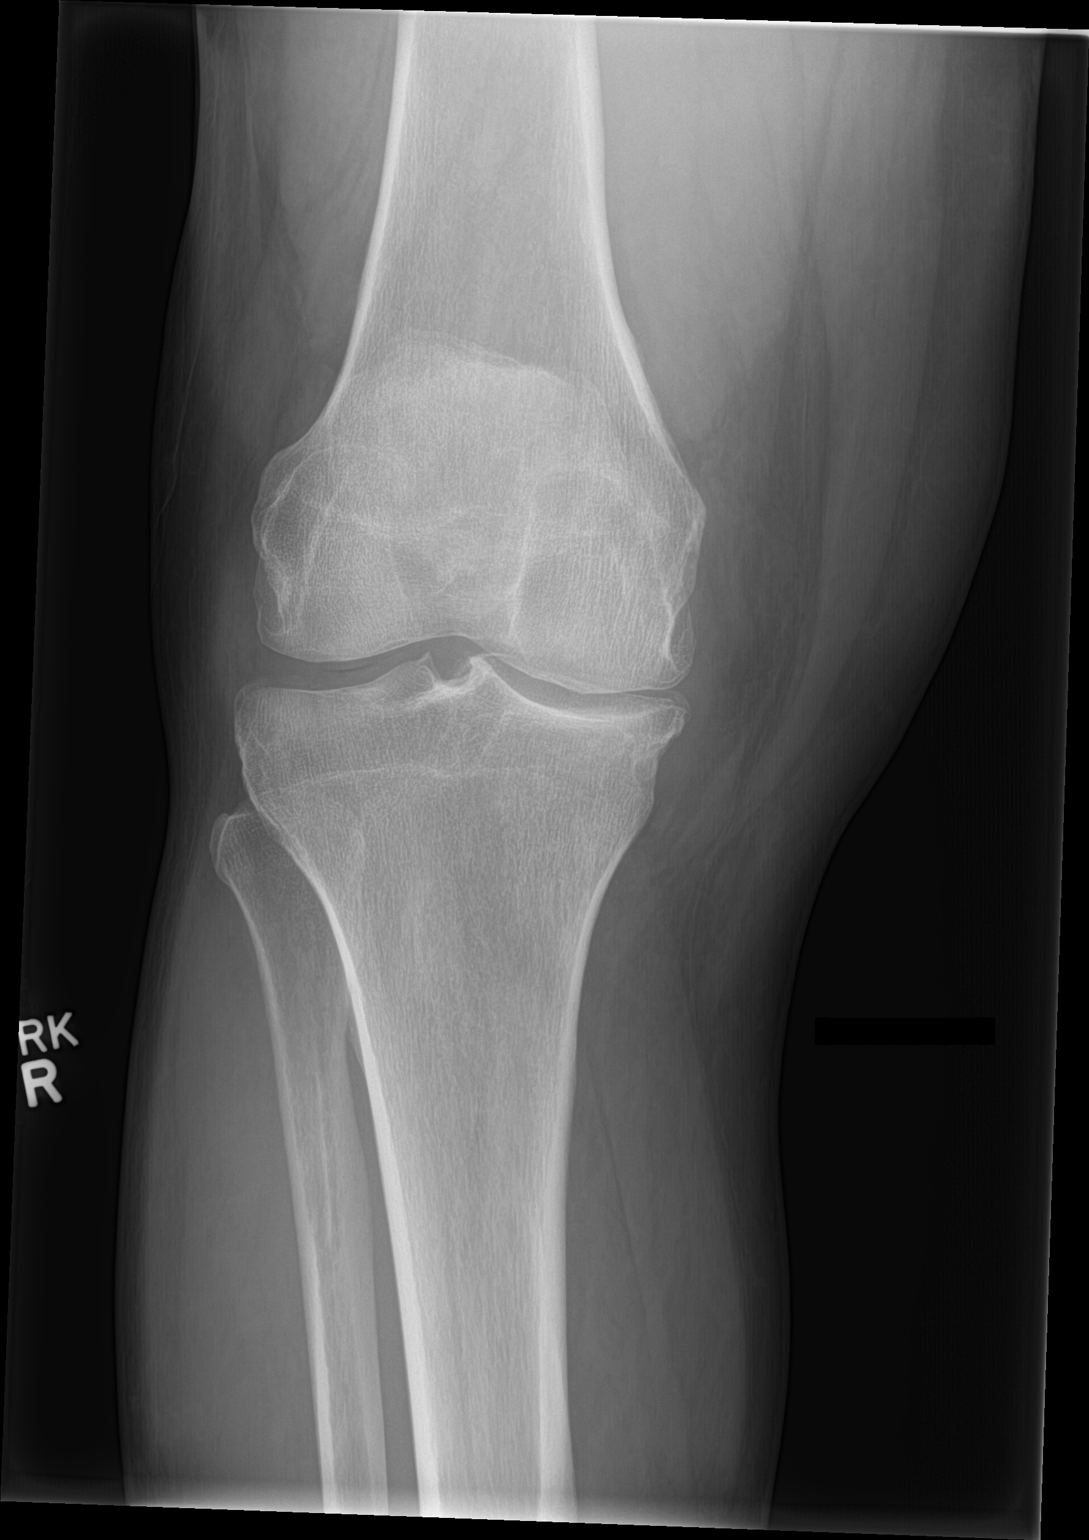

[2 of 2 positions shown; findings below may reference images not displayed]

FINDINGS: Standing AP views of both knees are submitted. There is bilateral
mild to moderate medial compartment joint space narrowing. Joint
space narrowing is fairly symmetric. There is osteophyte formation
along the medial tibial plateaus bilaterally. There is suggestion of
some soft tissue swelling adjacent to the medial aspect of the right
knee joint. No soft tissue swelling is appreciated on the left.

The lateral joint spaces are preserved bilaterally.

No fracture is identified.
IMPRESSION: Bilateral mild to moderate medial compartment joint space narrowing
and osteophyte formation.

Soft tissue swelling adjacent along the medial aspect of the right
knee.

## 2017-06-20 ENCOUNTER — Other Ambulatory Visit: Payer: Self-pay | Admitting: Internal Medicine

## 2017-06-20 DIAGNOSIS — I10 Essential (primary) hypertension: Secondary | ICD-10-CM

## 2017-06-20 MED FILL — HYDROCHLOROTHIAZIDE 25 MG T: 25 | 30 days supply | Qty: 30 | Fill #0

## 2017-06-24 ENCOUNTER — Ambulatory Visit: Payer: Self-pay | Attending: Internal Medicine | Admitting: Internal Medicine

## 2017-06-24 ENCOUNTER — Encounter: Payer: Self-pay | Admitting: Internal Medicine

## 2017-06-24 VITALS — BP 123/82 | HR 64 | Temp 98.5°F | Resp 16 | Wt 233.0 lb

## 2017-06-24 DIAGNOSIS — E669 Obesity, unspecified: Secondary | ICD-10-CM | POA: Insufficient documentation

## 2017-06-24 DIAGNOSIS — F17210 Nicotine dependence, cigarettes, uncomplicated: Secondary | ICD-10-CM | POA: Insufficient documentation

## 2017-06-24 DIAGNOSIS — N400 Enlarged prostate without lower urinary tract symptoms: Secondary | ICD-10-CM | POA: Insufficient documentation

## 2017-06-24 DIAGNOSIS — K029 Dental caries, unspecified: Secondary | ICD-10-CM | POA: Insufficient documentation

## 2017-06-24 DIAGNOSIS — K219 Gastro-esophageal reflux disease without esophagitis: Secondary | ICD-10-CM | POA: Insufficient documentation

## 2017-06-24 DIAGNOSIS — Z7982 Long term (current) use of aspirin: Secondary | ICD-10-CM | POA: Insufficient documentation

## 2017-06-24 DIAGNOSIS — Z6379 Other stressful life events affecting family and household: Secondary | ICD-10-CM

## 2017-06-24 DIAGNOSIS — Z79899 Other long term (current) drug therapy: Secondary | ICD-10-CM | POA: Insufficient documentation

## 2017-06-24 DIAGNOSIS — I10 Essential (primary) hypertension: Secondary | ICD-10-CM | POA: Insufficient documentation

## 2017-06-24 DIAGNOSIS — G4733 Obstructive sleep apnea (adult) (pediatric): Secondary | ICD-10-CM | POA: Insufficient documentation

## 2017-06-24 DIAGNOSIS — Z6835 Body mass index (BMI) 35.0-35.9, adult: Secondary | ICD-10-CM | POA: Insufficient documentation

## 2017-06-24 NOTE — Patient Instructions (Signed)
Call the Health Department to see if they have any dental programs/resourses.    Try to cut back on eating can meats.
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
## 2017-06-25 LAB — LIPID PANEL
CHOL/HDL RATIO: 3.6 ratio (ref 0.0–5.0)
Cholesterol, Total: 177 mg/dL (ref 100–199)
HDL: 49 mg/dL (ref >39)
LDL CALC: 104 mg/dL — AB (ref 0–99)
TRIGLYCERIDES: 120 mg/dL (ref 0–149)
VLDL CHOLESTEROL CAL: 24 mg/dL (ref 5–40)

## 2017-06-25 LAB — PSA: Prostate Specific Ag, Serum: 0.4 ng/mL (ref 0.0–4.0)

## 2017-06-27 MED FILL — ?TAMSULOSIN HCL 0.4 MG CAP: 0.4 | 30 days supply | Qty: 30 | Fill #3

## 2017-07-03 ENCOUNTER — Telehealth: Payer: Self-pay | Admitting: Pulmonary Disease

## 2017-07-03 MED FILL — ?ACYCLOVIR 400 TAB: 400 | 30 days supply | Qty: 60 | Fill #2

## 2017-07-03 MED FILL — VIT D2 1.25 MG (50,000 UNIT: 1.25 MG | 28 days supply | Qty: 4 | Fill #2

## 2017-07-03 NOTE — Telephone Encounter (Signed)
Pt is requesting a call from [facility] directly regarding being taking off of DOT.  Pt stated he did not wish to speak with nurse/CMA regarding this matter, as he felt uncomfortable during his last conversation with a nurse.   [facility] please advise. Thanks

## 2017-07-09 ENCOUNTER — Ambulatory Visit (HOSPITAL_COMMUNITY)
Admission: EM | Admit: 2017-07-09 | Discharge: 2017-07-09 | Disposition: A | Discharge status: Home / Self Care | Payer: Self-pay | Attending: Family Medicine | Admitting: Family Medicine

## 2017-07-09 ENCOUNTER — Encounter (HOSPITAL_COMMUNITY): Payer: Self-pay | Admitting: Emergency Medicine

## 2017-07-09 ENCOUNTER — Other Ambulatory Visit: Payer: Self-pay

## 2017-07-09 DIAGNOSIS — M545 Low back pain, unspecified: Secondary | ICD-10-CM

## 2017-07-09 DIAGNOSIS — M542 Cervicalgia: Secondary | ICD-10-CM

## 2017-07-09 LAB — POCT URINALYSIS DIP (DEVICE)
Bilirubin Urine: NEGATIVE
GLUCOSE, UA: NEGATIVE mg/dL
Hgb urine dipstick: NEGATIVE
KETONES UR: NEGATIVE mg/dL
LEUKOCYTES UA: NEGATIVE
Nitrite: NEGATIVE
PROTEIN: NEGATIVE mg/dL
Specific Gravity, Urine: 1.025 (ref 1.005–1.030)
Urobilinogen, UA: 1 mg/dL (ref 0.0–1.0)
pH: 7 (ref 5.0–8.0)

## 2017-07-09 MED ORDER — MELOXICAM 7.5 MG PO TABS: 7.5000 mg | ORAL_TABLET | Freq: Every day | ORAL | 0 refills | Status: DC

## 2017-07-09 MED FILL — MELOXICAM 7.5 MG TABLET: 7.5 | 15 days supply | Qty: 15 | Fill #0

## 2017-07-09 NOTE — Discharge Instructions (Signed)
No alarming signs on exam. Your exam was most consistent with muscle strain. Start mobic as directed. Ice/heat compresses as needed. This can take up to 3-4 weeks to completely resolve, but you should be feeling better each week. Follow up here or with PCP if symptoms worsen, changes for reevaluation. If experience numbness/tingling of the inner thighs, loss of bladder or bowel control, go to the emergency department for evaluation. If you experience worse headache of your life, weakness, dizziness, confusion/altered mental status, nausea/vomiting, sensitivity to light, go to the emergency department for further evaluation needed.

## 2017-07-09 NOTE — ED Provider Notes (Signed)
[facility]    CSN: [id number] Arrival date & time: 07/09/17  1358     History   Chief Complaint Chief Complaint  Patient presents with  . Torticollis    HPI Samuel Irwin is a 57 y.o. male.   57 year old male comes in for multiple complaints.  1 week history of left-sided neck pain.  This started after doing physical activity.  Denies injury/trauma.  States no pain at rest, but exacerbates with movement.  This pain does not radiate.  Denies numbness, tingling.  Has not tried anything for the symptoms.  Patient worried about aneurysm.  Denies worse headache in the world, thunderclap headache.  Denies photophobia, weakness, dizziness, confusion.  Also with 1 week history of right lower back pain, usually when he gets out of the bed in the morning.  Denies injury/trauma.  Again, no pain at rest, worse with movement.  States this is different from his chronic low back pain.  He denies urinary symptoms such as frequency, dysuria, hematuria.  Denies nausea, vomiting.  Denies fever, chills, night sweats.  Denies saddle anesthesia, loss of bladder or bowel control.  Denies radiation of pain, numbness, tingling.     Past Medical History:  Diagnosis Date  . Constipation   . Cough   . Generalized headaches   . GERD (gastroesophageal reflux disease)   . H/O hiatal hernia   . Heart murmur   . Herpes   . Hypertension   . Sleep apnea    uses BIPAP  . Umbilical hernia   . Weakness   . Wheezing     Patient Active Problem List   Diagnosis Date Noted  . Obesity (BMI 35.0-39.9 without comorbidity) 03/10/2017  . Pain in metatarsus, bilateral 09/23/2016  . Floaters in visual field, right 09/23/2016  . Osteoarthritis of right knee 02/28/2016  . Chronic bilateral low back pain 02/28/2016  . Poor dentition 01/29/2016  . Essential hypertension 06/06/2015  . BPH (benign prostatic hyperplasia) 05/04/2015  . Other seasonal allergic rhinitis 05/04/2015  . Target of perceived  adverse discrimination or persecution 04/05/2015  . Hemorrhoids 02/23/2015  . Constipation 02/23/2015  . GERD (gastroesophageal reflux disease) 09/21/2014  . Carpal tunnel syndrome 08/12/2014  . Erectile dysfunction 08/20/2013  . Dyslipidemia 08/20/2013  . Morbid obesity ([place]) 08/20/2013  . OSA (obstructive sleep apnea) 08/24/2012    Past Surgical History:  Procedure Laterality Date  . BIOPSY  10/18/2014   Procedure: BIOPSY (Gastric);  Surgeon: Danie Binder, MD;  Location: [facility];  Service: Endoscopy;;  . CARPAL TUNNEL RELEASE     R hand  . COLONOSCOPY WITH PROPOFOL N/A 10/18/2014   GMW:NUUVOZDG size external hemorrhoids/left colon is redundant  . ESOPHAGOGASTRODUODENOSCOPY (EGD) WITH PROPOFOL N/A 10/18/2014   SLF: epigastric pain due to moderate NSAID gastritis  . FEMUR SURGERY     left  . HERNIA REPAIR  04/07/12   umb hernia repair  . INSERTION OF MESH N/A 04/07/2012   Procedure: INSERTION OF MESH;  Surgeon: Madilyn Hook, DO;  Location: [facility];  Service: General;  Laterality: N/A;  . KNEE ARTHROSCOPY     right  . MINOR CARPAL TUNNEL Left 01/31/2016   Procedure: MINOR CARPAL TUNNEL RELEASE LEFT;  Surgeon: Charlotte Crumb, MD;  Location: [facility];  Service: Orthopedics;  Laterality: Left;  Local  . TOE SURGERY     dislocated lt foot  . UMBILICAL HERNIA REPAIR N/A 04/07/2012   Procedure: HERNIA REPAIR UMBILICAL ADULT;  Surgeon: Madilyn Hook, DO;  Location[facility];  Service: General;  Laterality: N/A;  open umbilical hernia repair with mesh       Home Medications    Prior to Admission medications   Medication Sig Start Date End Date Taking? Authorizing Provider  acyclovir (ZOVIRAX) 400 MG tablet Take 1 tablet (400 mg total) by mouth 2 (two) times daily. 04/09/17   Ladell Pier, MD  aspirin EC 81 MG tablet Take 81 mg by mouth daily.    [provider]  clotrimazole-betamethasone (LOTRISONE) lotion Apply topically 2 (two) times daily. To B feet  between toes for 3 weeks Patient not taking: Reported on 05/29/2017 12/10/16   Argentina Donovan, PA-C  fluticasone [facility]) 50 MCG/ACT nasal spray Place 2 sprays into both nostrils daily. 05/04/15   Funches, Adriana Mccallum, MD  hydrochlorothiazide (HYDRODIURIL) 25 MG tablet TAKE 1 TABLET BY MOUTH DAILY. 06/20/17   Ladell Pier, MD  ibuprofen (ADVIL,MOTRIN) 600 MG tablet Take 1 tablet (600 mg total) by mouth every 6 (six) hours as needed. 05/29/17   Argentina Donovan, PA-C  meloxicam (MOBIC) 7.5 MG tablet Take 1 tablet (7.5 mg total) by mouth daily. 07/09/17   Tasia Catchings, Dionta Larke V, PA-C  Multiple Vitamin (MULTIVITAMIN) capsule Take 1 capsule by mouth daily.    [provider]  omeprazole (PRILOSEC) 20 MG capsule Take 1 capsule (20 mg total) by mouth daily as needed (heart burn). 03/10/17   Ladell Pier, MD  tadalafil (CIALIS) 10 MG tablet Take 2 tablets daily as needed for erectile dysfunction. 03/10/17   Ladell Pier, MD  tamsulosin (FLOMAX) 0.4 MG CAPS capsule Take 1 capsule (0.4 mg total) by mouth daily. 03/10/17   Ladell Pier, MD  Vitamin D, Ergocalciferol, (DRISDOL) 50000 units CAPS capsule Take 1 capsule (50,000 Units total) by mouth every 7 (seven) days. 05/08/17   Argentina Donovan, PA-C    Family History Family History  Problem Relation Age of Onset  . Hypertension Mother   . Cancer Father        lung  . Colon cancer Neg Hx     Social History Social History   Tobacco Use  . Smoking status: Current Some Day Smoker    Packs/day: 0.25    Years: 15.00    Pack years: 3.75    Types: Cigarettes  . Smokeless tobacco: Never Used  . Tobacco comment: occas still smokes when he drinks  Substance Use Topics  . Alcohol use: Yes    Alcohol/week: 0.0 oz    Comment: once weekly  . Drug use: No    Types: Cocaine, "Crack" cocaine    Comment: smoked crack 1 wk ago     Allergies   Patient has no known allergies.   Review of Systems Review of Systems  Reason unable to perform  ROS: See HPI as above.     Physical Exam Triage Vital Signs ED Triage Vitals  Enc Vitals Group     BP 07/09/17 1440 120/80     Pulse Rate 07/09/17 1440 82     Resp 07/09/17 1440 20     Temp 07/09/17 1440 98.4 F (36.9 C)     Temp Source 07/09/17 1440 Oral     SpO2 07/09/17 1440 97 %     Weight --      Height --      Head Circumference --      Peak Flow --      Pain Score 07/09/17 1437 6  Pain Loc --      Pain Edu? --      Excl. in [place]? --    No data found.  Updated Vital Signs BP 120/80 (BP Location: Left Arm)   Pulse 82   Temp 98.4 F (36.9 C) (Oral)   Resp 20   SpO2 97%   Physical Exam  Constitutional: He is oriented to person, place, and time. He appears well-developed and well-nourished. No distress.  HENT:  Head: Normocephalic and atraumatic.  Eyes: Pupils are equal, round, and reactive to light. Conjunctivae and EOM are normal.  Neck: Normal range of motion. Neck supple. No spinous process tenderness and no muscular tenderness present. Normal range of motion present.  Range of motion of neck exacerbates pain.  Cardiovascular: Normal rate, regular rhythm and normal heart sounds. Exam reveals no gallop and no friction rub.  No murmur heard. Pulmonary/Chest: Effort normal and breath sounds normal. He has no wheezes. He has no rales.  Musculoskeletal:  No tenderness on palpation of the spinous processes.  No tenderness to palpation of bilateral back.  Full range of motion back and hips. Strength normal and equal bilaterally. Sensation intact and equal bilaterally.  Negative straight leg raise.  No tenderness to palpation of trapezius muscle/shoulder.  Full range of motion of shoulder.  Strength normal and equal bilaterally.  Sensation intact and equal bilaterally. Radial pulses 2+ and equal bilaterally. Capillary refill less than 2 seconds.   Neurological: He is alert and oriented to person, place, and time. He has normal strength. He is not disoriented.  Coordination and gait normal. GCS eye subscore is 4. GCS verbal subscore is 5. GCS motor subscore is 6.  Skin: Skin is warm and dry.     UC Treatments / Results  Labs (all labs ordered are listed, but only abnormal results are displayed) Labs Reviewed  POCT URINALYSIS DIP (DEVICE)    EKG None  Radiology No results found.  Procedures Procedures (including critical care time)  Medications Ordered in UC Medications - No data to display  Initial Impression / Assessment and Plan / UC Course  I have reviewed the triage vital signs and the nursing notes.  Pertinent labs & imaging results that were available during my care of the patient were reviewed by me and considered in my medical decision making (see chart for details).     Reassured patient that symptoms does not match ruptured aneurysm.  Will provide return precautions for aneurysm.  History and exam are consistent with muscle strain.  Mobic as directed.  Ice/heat compress as needed.  Return precautions given.  Patient expresses understanding and agrees to plan.  Final Clinical Impressions(s) / UC Diagnoses   Final diagnoses:  Neck pain on left side  Acute right-sided low back pain without sciatica    ED Prescriptions    Medication Sig Dispense Auth. Provider   meloxicam (MOBIC) 7.5 MG tablet Take 1 tablet (7.5 mg total) by mouth daily. 15 tablet Tobin Chad, [place] 07/09/17 1545

## 2017-07-09 NOTE — ED Triage Notes (Signed)
Neck pain and stiffness for a week.  Patient relates neck stiffness to power washing episode.    Right lower back pain for about a week as well

## 2017-07-10 NOTE — Telephone Encounter (Signed)
Dr. Sood - please advise. Thanks. 

## 2017-07-15 ENCOUNTER — Telehealth: Payer: Self-pay | Admitting: Internal Medicine

## 2017-07-15 DIAGNOSIS — E559 Vitamin D deficiency, unspecified: Secondary | ICD-10-CM

## 2017-07-15 NOTE — Telephone Encounter (Signed)
Will forward to pcp

## 2017-07-15 NOTE — Telephone Encounter (Signed)
Patient called stating that he was prescribed Vitamin D and that he lost his medication. Patient would like to know if he can get another Rx. Please f/u

## 2017-07-16 MED ORDER — VITAMIN D (ERGOCALCIFEROL) 1.25 MG (50000 UNIT) PO CAPS: 50000.0000 [IU] | ORAL_CAPSULE | ORAL | 0 refills | Status: DC

## 2017-07-17 MED FILL — VIT D2 1.25 MG (50,000 UNIT: 1.25 MG | 28 days supply | Qty: 4 | Fill #3

## 2017-07-18 NOTE — Telephone Encounter (Signed)
Tried calling pt.  No answer.  Please contact pt and ask him for phone number he can be reached at.

## 2017-07-22 NOTE — Telephone Encounter (Signed)
Routing back to Dr. Halford Chessman to follow up on.

## 2017-07-22 NOTE — Telephone Encounter (Signed)
ATC pt, no answer. Left message for pt to call back.  

## 2017-07-22 NOTE — Telephone Encounter (Signed)
Patient returned call.  CB is [phone].  Patient states he will be available anytime today.

## 2017-07-25 MED FILL — ?TAMSULOSIN HCL 0.4 MG CAP: 0.4 | 30 days supply | Qty: 30 | Fill #4

## 2017-07-25 MED FILL — HYDROCHLOROTHIAZIDE 25 MG T: 25 | 30 days supply | Qty: 30 | Fill #1

## 2017-07-28 NOTE — Telephone Encounter (Signed)
VS patient is calling you back.

## 2017-07-28 NOTE — Telephone Encounter (Signed)
Pt is calling back about the DOT [phone]

## 2017-07-31 NOTE — Telephone Encounter (Signed)
Will route to Kelli to follow up 

## 2017-07-31 NOTE — Telephone Encounter (Signed)
Pt is requesting a call from VS directly regarding being taking off of DOT.  Pt is not wanting to speak with a nurse or CMA regarding his concern. Pt contact number is mobile [phone] or home is [phone] Routing message to VS for review.

## 2017-08-01 NOTE — Telephone Encounter (Signed)
Please arrange for follow up appointment with me to further discuss.

## 2017-08-01 NOTE — Telephone Encounter (Signed)
Left voice mail on machine for patient to return phone call back regarding in need to schedule an appt to f/u with VS this coming week to further discuss his concerns with VS. X1

## 2017-08-04 ENCOUNTER — Encounter: Payer: Self-pay | Admitting: Internal Medicine

## 2017-08-04 ENCOUNTER — Ambulatory Visit: Payer: Self-pay | Attending: Internal Medicine | Admitting: Internal Medicine

## 2017-08-04 VITALS — BP 128/80 | HR 80 | Temp 98.7°F | Resp 16 | Wt 226.4 lb

## 2017-08-04 DIAGNOSIS — Z9889 Other specified postprocedural states: Secondary | ICD-10-CM | POA: Insufficient documentation

## 2017-08-04 DIAGNOSIS — M1711 Unilateral primary osteoarthritis, right knee: Secondary | ICD-10-CM | POA: Insufficient documentation

## 2017-08-04 DIAGNOSIS — Z7982 Long term (current) use of aspirin: Secondary | ICD-10-CM | POA: Insufficient documentation

## 2017-08-04 DIAGNOSIS — Z791 Long term (current) use of non-steroidal anti-inflammatories (NSAID): Secondary | ICD-10-CM | POA: Insufficient documentation

## 2017-08-04 DIAGNOSIS — F17210 Nicotine dependence, cigarettes, uncomplicated: Secondary | ICD-10-CM | POA: Insufficient documentation

## 2017-08-04 DIAGNOSIS — I10 Essential (primary) hypertension: Secondary | ICD-10-CM | POA: Insufficient documentation

## 2017-08-04 DIAGNOSIS — Z79899 Other long term (current) drug therapy: Secondary | ICD-10-CM | POA: Insufficient documentation

## 2017-08-04 DIAGNOSIS — G4733 Obstructive sleep apnea (adult) (pediatric): Secondary | ICD-10-CM | POA: Insufficient documentation

## 2017-08-04 DIAGNOSIS — K219 Gastro-esophageal reflux disease without esophagitis: Secondary | ICD-10-CM | POA: Insufficient documentation

## 2017-08-04 DIAGNOSIS — N5089 Other specified disorders of the male genital organs: Secondary | ICD-10-CM

## 2017-08-04 DIAGNOSIS — I8393 Asymptomatic varicose veins of bilateral lower extremities: Secondary | ICD-10-CM

## 2017-08-04 DIAGNOSIS — N400 Enlarged prostate without lower urinary tract symptoms: Secondary | ICD-10-CM | POA: Insufficient documentation

## 2017-08-04 DIAGNOSIS — N509 Disorder of male genital organs, unspecified: Secondary | ICD-10-CM

## 2017-08-04 MED ORDER — MELOXICAM 7.5 MG PO TABS: 7.5000 mg | ORAL_TABLET | Freq: Every day | ORAL | 1 refills | Status: DC

## 2017-08-04 MED FILL — MELOXICAM 7.5 MG TABLET: 7.5 | 30 days supply | Qty: 30 | Fill #0

## 2017-08-04 NOTE — Progress Notes (Signed)
Patient ID: Samuel Irwin, male    DOB: 1961-01-25  MRN: [id number]  CC: No chief complaint on file.   Subjective: Samuel Irwin is a 57 y.o. male who presents for urgent care visit. His concerns today include:  Pt with hxo fo HTN, GERD, ED, tob dep, BPH, OSA on Bipap  Patient is concerned about her vein on his left lower leg.  He is not sure how long it is been there but states that it was noticed by his friend about 6 months ago.  He finally got a full-length mirror and was able to see the tortuous vein in the left lower anterior leg.  He would like to have it removed. No pain No feeling of heaviness or swelling in leg  Request RF on Meloxoicam  Has a knot in scrotal sac since 2003.Marland Kitchen  He feels it is increasing in size.  Told by his PCP a that time to observe for now.  Occasional aching in scrotum Takes Ibuprofen PRN.   Patient Active Problem List   Diagnosis Date Noted  . Obesity (BMI 35.0-39.9 without comorbidity) 03/10/2017  . Pain in metatarsus, bilateral 09/23/2016  . Floaters in visual field, right 09/23/2016  . Osteoarthritis of right knee 02/28/2016  . Chronic bilateral low back pain 02/28/2016  . Poor dentition 01/29/2016  . Essential hypertension 06/06/2015  . BPH (benign prostatic hyperplasia) 05/04/2015  . Other seasonal allergic rhinitis 05/04/2015  . Target of perceived adverse discrimination or persecution 04/05/2015  . Hemorrhoids 02/23/2015  . Constipation 02/23/2015  . GERD (gastroesophageal reflux disease) 09/21/2014  . Carpal tunnel syndrome 08/12/2014  . Erectile dysfunction 08/20/2013  . Dyslipidemia 08/20/2013  . Morbid obesity ([place]) 08/20/2013  . OSA (obstructive sleep apnea) 08/24/2012     Current Outpatient Medications on File Prior to Visit  Medication Sig Dispense Refill  . acyclovir (ZOVIRAX) 400 MG tablet Take 1 tablet (400 mg total) by mouth 2 (two) times daily. 60 tablet 2  . aspirin EC 81 MG tablet Take 81 mg by mouth daily.    .  clotrimazole-betamethasone (LOTRISONE) lotion Apply topically 2 (two) times daily. To B feet between toes for 3 weeks (Patient not taking: Reported on 05/29/2017) 30 mL 1  . fluticasone (FLONASE) 50 MCG/ACT nasal spray Place 2 sprays into both nostrils daily. 16 g 6  . hydrochlorothiazide (HYDRODIURIL) 25 MG tablet TAKE 1 TABLET BY MOUTH DAILY. 30 tablet 2  . ibuprofen (ADVIL,MOTRIN) 600 MG tablet Take 1 tablet (600 mg total) by mouth every 6 (six) hours as needed. 30 tablet 0  . Multiple Vitamin (MULTIVITAMIN) capsule Take 1 capsule by mouth daily.    Marland Kitchen omeprazole (PRILOSEC) 20 MG capsule Take 1 capsule (20 mg total) by mouth daily as needed (heart burn). 90 capsule 3  . tadalafil (CIALIS) 10 MG tablet Take 2 tablets daily as needed for erectile dysfunction. 60 tablet 3  . tamsulosin (FLOMAX) 0.4 MG CAPS capsule Take 1 capsule (0.4 mg total) by mouth daily. 30 capsule 6  . Vitamin D, Ergocalciferol, (DRISDOL) 50000 units CAPS capsule Take 1 capsule (50,000 Units total) by mouth every 7 (seven) days. 16 capsule 0   No current facility-administered medications on file prior to visit.     No Known Allergies  Social History   Socioeconomic History  . Marital status: Single    Spouse name: Not on file  . Number of children: Not on file  . Years of education: Not on file  . Highest education  level: Not on file  Occupational History  . Occupation: unemployed  Social Needs  . Financial resource strain: Not on file  . Food insecurity:    Worry: Not on file    Inability: Not on file  . Transportation needs:    Medical: Not on file    Non-medical: Not on file  Tobacco Use  . Smoking status: Current Some Day Smoker    Packs/day: 0.25    Years: 15.00    Pack years: 3.75    Types: Cigarettes  . Smokeless tobacco: Never Used  . Tobacco comment: occas still smokes when he drinks  Substance and Sexual Activity  . Alcohol use: Yes    Alcohol/week: 0.0 oz    Comment: once weekly  . Drug use:  No    Types: Cocaine, "Crack" cocaine    Comment: smoked crack 1 wk ago  . Sexual activity: Not on file    Comment: "when I drink"  Lifestyle  . Physical activity:    Days per week: Not on file    Minutes per session: Not on file  . Stress: Not on file  Relationships  . Social connections:    Talks on phone: Not on file    Gets together: Not on file    Attends religious service: Not on file    Active member of club or organization: Not on file    Attends meetings of clubs or organizations: Not on file    Relationship status: Not on file  . Intimate partner violence:    Fear of current or ex partner: Not on file    Emotionally abused: Not on file    Physically abused: Not on file    Forced sexual activity: Not on file  Other Topics Concern  . Not on file  Social History Narrative  . Not on file    Family History  Problem Relation Age of Onset  . Hypertension Mother   . Cancer Father        lung  . Colon cancer Neg Hx     Past Surgical History:  Procedure Laterality Date  . BIOPSY  10/18/2014   Procedure: BIOPSY (Gastric);  Surgeon: Danie Binder, MD;  Location: [facility];  Service: Endoscopy;;  . CARPAL TUNNEL RELEASE     R hand  . COLONOSCOPY WITH PROPOFOL N/A 10/18/2014   PXT:GGYIRSWN size external hemorrhoids/left colon is redundant  . ESOPHAGOGASTRODUODENOSCOPY (EGD) WITH PROPOFOL N/A 10/18/2014   SLF: epigastric pain due to moderate NSAID gastritis  . FEMUR SURGERY     left  . HERNIA REPAIR  04/07/12   umb hernia repair  . INSERTION OF MESH N/A 04/07/2012   Procedure: INSERTION OF MESH;  Surgeon: Madilyn Hook, DO;  Location: [facility];  Service: General;  Laterality: N/A;  . KNEE ARTHROSCOPY     right  . MINOR CARPAL TUNNEL Left 01/31/2016   Procedure: MINOR CARPAL TUNNEL RELEASE LEFT;  Surgeon: Charlotte Crumb, MD;  Location: [facility];  Service: Orthopedics;  Laterality: Left;  Local  . TOE SURGERY     dislocated lt foot  . UMBILICAL HERNIA REPAIR  N/A 04/07/2012   Procedure: HERNIA REPAIR UMBILICAL ADULT;  Surgeon: Madilyn Hook, DO;  Location: [facility];  Service: General;  Laterality: N/A;  open umbilical hernia repair with mesh    ROS: Review of Systems Negative except as stated above PHYSICAL EXAM: BP 128/80   Pulse 80   Temp 98.7 F (37.1 C) (Oral)  Resp 16   Wt 226 lb 6.4 oz (102.7 kg)   SpO2 95%   BMI 34.42 kg/m   Wt Readings from Last 3 Encounters:  08/04/17 226 lb 6.4 oz (102.7 kg)  06/24/17 233 lb (105.7 kg)  05/29/17 230 lb 3.2 oz (104.4 kg)   Physical Exam General appearance - alert, well appearing, and in no distress Mental status - normal mood, behavior, speech, dress, motor activity, and thought processes GU Male -CMA, Sallyanne Havers is present: No edema of the scrotal sac.  Both testes feel normal.  Is a 3 cm firm movable mass in the lower part of the scrotal sac that feels superficial.  No tenderness on palpation. Extremities - peripheral pulses normal, no pedal edema, no clubbing or cyanosis He has a torturous varicose vein in the left lower extremity that crosses laterally over the shin.  It is not inflamed and nontender to touch.  He has small spider veins noted on the inner and posterior thigh and the inner ankle.  He also has smaller varicose vein noted on the lateral aspect of the right calf, popliteal area and medial ankle  ASSESSMENT AND PLAN: 1. Asymptomatic varicose veins of both lower extremities -I went over diagnoses, pathophysiology and management with him.  These are asymptomatic.  He can wear compression socks during the day if he is on his legs a lot.  Patient is more concerned about the cosmetic appearance and would like to have treatment done for cosmetic purposes.  However he does not have insurance.  He wants to check into it himself.  I told him he can call in the vein clinic here in [place] and inquire about laser or sclerotherapy for varicose veins 2. Scrotal mass Questionably small cysts.   Since it has increased in size we will do an ultrasound - US Scrotum; Future  Patient was given the opportunity to ask questions.  Patient verbalized understanding of the plan and was able to repeat key elements of the plan.   Orders Placed This Encounter  Procedures  . US Scrotum     Requested Prescriptions   Signed Prescriptions Disp Refills  . meloxicam (MOBIC) 7.5 MG tablet 30 tablet 1    Sig: Take 1 tablet (7.5 mg total) by mouth daily.    Return if symptoms worsen or fail to improve.  Karle Plumber, MD, FACP

## 2017-08-04 NOTE — Telephone Encounter (Signed)
Called and spoke with pt today regarding scheduling appt with VS. Scheduled appt with VS for this Wednesday 08-06-17 at 10am. Nothing further needed at this time.

## 2017-08-04 NOTE — Patient Instructions (Signed)
Varicose Veins Varicose veins are veins that have become enlarged and twisted. They are usually seen in the legs but can occur in other parts of the body as well. What are the causes? This condition is the result of valves in the veins not working properly. Valves in the veins help to return blood from the leg to the heart. If these valves are damaged, blood flows backward and backs up into the veins in the leg near the skin. This causes the veins to become larger. What increases the risk? People who are on their feet a lot, who are pregnant, or who are overweight are more likely to develop varicose veins. What are the signs or symptoms?  Bulging, twisted-appearing, bluish veins, most commonly found on the legs.  Leg pain or a feeling of heaviness. These symptoms may be worse at the end of the day.  Leg swelling.  Changes in skin color. How is this diagnosed? A health care provider can usually diagnose varicose veins by examining your legs. Your health care provider may also recommend an ultrasound of your leg veins. How is this treated? Most varicose veins can be treated at home.However, other treatments are available for people who have persistent symptoms or want to improve the cosmetic appearance of the varicose veins. These treatment options include:  Sclerotherapy. A solution is injected into the vein to close it off.  Laser treatment. A laser is used to heat the vein to close it off.  Radiofrequency vein ablation. An electrical current produced by radio waves is used to close off the vein.  Phlebectomy. The vein is surgically removed through small incisions made over the varicose vein.  Vein ligation and stripping. The vein is surgically removed through incisions made over the varicose vein after the vein has been tied (ligated). Follow these instructions at home:   Do not stand or sit in one position for long periods of time. Do not sit with your legs crossed. Rest with your  legs raised during the day.  Wear compression stockings as directed by your health care provider. These stockings help to prevent blood clots and reduce swelling in your legs.  Do not wear other tight, encircling garments around your legs, pelvis, or waist.  Walk as much as possible to increase blood flow.  Raise the foot of your bed at night with 2-inch blocks.  If you get a cut in the skin over the vein and the vein bleeds, lie down with your leg raised and press on it with a clean cloth until the bleeding stops. Then place a bandage (dressing) on the cut. See your health care provider if it continues to bleed. Contact a health care provider if:  The skin around your ankle starts to break down.  You have pain, redness, tenderness, or hard swelling in your leg over a vein.  You are uncomfortable because of leg pain. This information is not intended to replace advice given to you by your health care provider. Make sure you discuss any questions you have with your health care provider. Document Released: 11/21/2004 Document Revised: 07/20/2015 Document Reviewed: 08/15/2015 Elsevier Interactive Patient Education  2017 [employer] Inc[employer]  

## 2017-08-04 NOTE — Progress Notes (Signed)
Pt states he has a big vein that pops out in his left leg  Pt states he has never noticed it until one of his friends pointed it out  Pt denies any pain

## 2017-08-04 NOTE — Telephone Encounter (Signed)
ATC- no answer with no option to leave vm.

## 2017-08-06 ENCOUNTER — Encounter: Payer: Self-pay | Admitting: Pulmonary Disease

## 2017-08-06 ENCOUNTER — Ambulatory Visit (INDEPENDENT_AMBULATORY_CARE_PROVIDER_SITE_OTHER): Payer: Self-pay | Admitting: Pulmonary Disease

## 2017-08-06 VITALS — BP 118/84 | HR 72 | Ht 68.0 in | Wt 228.0 lb

## 2017-08-06 DIAGNOSIS — G4733 Obstructive sleep apnea (adult) (pediatric): Secondary | ICD-10-CM
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
## 2017-08-06 NOTE — Patient Instructions (Signed)
Will get copy of Bipap report  Follow up in 6 months

## 2017-08-08 ENCOUNTER — Other Ambulatory Visit: Payer: Self-pay | Admitting: Internal Medicine

## 2017-08-08 ENCOUNTER — Ambulatory Visit (HOSPITAL_COMMUNITY): Payer: Self-pay

## 2017-08-08 ENCOUNTER — Other Ambulatory Visit: Payer: Self-pay | Admitting: *Deleted

## 2017-08-08 DIAGNOSIS — N5089 Other specified disorders of the male genital organs: Secondary | ICD-10-CM

## 2017-08-08 NOTE — Progress Notes (Signed)
Radiology needed order changed!

## 2017-08-11 ENCOUNTER — Telehealth: Payer: Self-pay | Admitting: Pulmonary Disease
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
## 2017-08-12 ENCOUNTER — Ambulatory Visit (HOSPITAL_COMMUNITY)
Admission: RE | Admit: 2017-08-12 | Discharge: 2017-08-12 | Disposition: A | Discharge status: Home / Self Care | Payer: Self-pay | Source: Ambulatory Visit | Attending: Internal Medicine | Admitting: Internal Medicine

## 2017-08-12 DIAGNOSIS — N5089 Other specified disorders of the male genital organs: Secondary | ICD-10-CM

## 2017-08-12 DIAGNOSIS — N509 Disorder of male genital organs, unspecified: Secondary | ICD-10-CM | POA: Insufficient documentation

## 2017-08-12 DIAGNOSIS — N433 Hydrocele, unspecified: Secondary | ICD-10-CM | POA: Insufficient documentation

## 2017-08-12 DIAGNOSIS — I861 Scrotal varices: Secondary | ICD-10-CM | POA: Insufficient documentation
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
## 2017-08-12 NOTE — Telephone Encounter (Signed)
Spoke with pt. He is aware of Dr. Juanetta Gosling response. Pt is going to contact the DMV and get back with [place].

## 2017-08-13 ENCOUNTER — Telehealth: Payer: Self-pay

## 2017-08-13 ENCOUNTER — Other Ambulatory Visit: Payer: Self-pay | Admitting: Internal Medicine

## 2017-08-13 DIAGNOSIS — N5089 Other specified disorders of the male genital organs: Secondary | ICD-10-CM

## 2017-08-13 NOTE — Progress Notes (Signed)
u

## 2017-08-13 NOTE — Telephone Encounter (Signed)
Contacted pt to go over [place] results pt is aware and doesn't have any questions or concerns

## 2017-08-14 NOTE — Telephone Encounter (Signed)
Called pt for update. Pt stated he would be contacting the DMV today. Will await call back.

## 2017-08-15 MED FILL — VIT D2 1.25 MG (50,000 UNIT: 1.25 MG | 28 days supply | Qty: 4 | Fill #0

## 2017-08-15 MED FILL — $CIALIS 10MG TABLET: 10 | 30 days supply | Qty: 15 | Fill #3

## 2017-08-15 NOTE — Telephone Encounter (Signed)
Attempted to contact pt but line went directly to VM.  Left message for pt to return call with an update.

## 2017-08-15 NOTE — Telephone Encounter (Signed)
Patient returned call.  States DOT told him they would send the patient new form and he would bring them to the office.  States the form will ask for exactly what they need from Dr. Halford Chessman.  Patient's  CB [id number].

## 2017-08-15 NOTE — Telephone Encounter (Signed)
Will await the form from pt so that we can give it to VS.

## 2017-08-18 MED FILL — HYDROCHLOROTHIAZIDE 25 MG T: 25 | 30 days supply | Qty: 30 | Fill #2

## 2017-08-18 NOTE — Telephone Encounter (Signed)
Will route to [facility] per her request.

## 2017-08-19 ENCOUNTER — Telehealth: Payer: Self-pay | Admitting: Pulmonary Disease

## 2017-08-19 NOTE — Telephone Encounter (Signed)
Spoke with Vida Roller, she has received the report. Will route to [facility] for follow up.
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
## 2017-08-21 ENCOUNTER — Encounter: Payer: Self-pay | Admitting: Internal Medicine

## 2017-08-22 MED ORDER — ACYCLOVIR 400 MG PO TABS: 400.0000 mg | ORAL_TABLET | Freq: Two times a day (BID) | ORAL | 5 refills | Status: DC

## 2017-08-22 MED FILL — ACYCLOVIR 400 MG TABLET: 400 | 30 days supply | Qty: 60 | Fill #0

## 2017-08-25 NOTE — Telephone Encounter (Signed)
Vida Roller, can you please provide an update on this? Thanks.

## 2017-08-25 NOTE — Telephone Encounter (Signed)
Waiting to hear back from VS regarding the reports, and creating the letter for the pt. VS is aware of the information, and has the report copies. Will update later this week. VS is out of the office this week, return back in the office on 09/01/2017

## 2017-09-02 MED FILL — ?TAMSULOSIN HCL 0.4 MG CAP: 0.4 | 30 days supply | Qty: 30 | Fill #5

## 2017-09-02 NOTE — Telephone Encounter (Signed)
VS has the information for the pt's letter request Per VS this morning, he will have a decision ready by Friday 09-05-2017 Will update pt on Friday

## 2017-09-02 NOTE — Telephone Encounter (Signed)
Kelli, please provide any updates. Thanks.

## 2017-09-04 ENCOUNTER — Encounter: Payer: Self-pay | Admitting: Pulmonary Disease

## 2017-09-05 NOTE — Telephone Encounter (Signed)
Called and spoke with the pt today regarding letter from VS is ready for p/u Pt advised he will be picking up the letter Monday morning 09/08/17 Letter is placed in brown folder in front office ready for pt to p/u Nothing further needed at this time.

## 2017-09-09 NOTE — Telephone Encounter (Signed)
Letter written

## 2017-09-12 ENCOUNTER — Other Ambulatory Visit: Payer: Self-pay

## 2017-09-12 ENCOUNTER — Encounter: Payer: Self-pay | Admitting: Internal Medicine

## 2017-09-12 ENCOUNTER — Ambulatory Visit: Payer: Self-pay | Attending: Internal Medicine | Admitting: Internal Medicine

## 2017-09-12 VITALS — BP 134/80 | HR 82 | Temp 98.4°F | Resp 16 | Ht 68.0 in | Wt 219.6 lb

## 2017-09-12 DIAGNOSIS — R51 Headache: Secondary | ICD-10-CM | POA: Insufficient documentation

## 2017-09-12 DIAGNOSIS — Z7982 Long term (current) use of aspirin: Secondary | ICD-10-CM | POA: Insufficient documentation

## 2017-09-12 DIAGNOSIS — Z9889 Other specified postprocedural states: Secondary | ICD-10-CM | POA: Insufficient documentation

## 2017-09-12 DIAGNOSIS — H538 Other visual disturbances: Secondary | ICD-10-CM | POA: Insufficient documentation

## 2017-09-12 DIAGNOSIS — E785 Hyperlipidemia, unspecified: Secondary | ICD-10-CM | POA: Insufficient documentation

## 2017-09-12 DIAGNOSIS — R1031 Right lower quadrant pain: Secondary | ICD-10-CM | POA: Insufficient documentation

## 2017-09-12 DIAGNOSIS — Z0000 Encounter for general adult medical examination without abnormal findings: Secondary | ICD-10-CM

## 2017-09-12 DIAGNOSIS — Z79899 Other long term (current) drug therapy: Secondary | ICD-10-CM | POA: Insufficient documentation

## 2017-09-12 DIAGNOSIS — I10 Essential (primary) hypertension: Secondary | ICD-10-CM | POA: Insufficient documentation

## 2017-09-12 DIAGNOSIS — Z6379 Other stressful life events affecting family and household: Secondary | ICD-10-CM | POA: Insufficient documentation

## 2017-09-12 DIAGNOSIS — N529 Male erectile dysfunction, unspecified: Secondary | ICD-10-CM | POA: Insufficient documentation

## 2017-09-12 DIAGNOSIS — F17210 Nicotine dependence, cigarettes, uncomplicated: Secondary | ICD-10-CM | POA: Insufficient documentation

## 2017-09-12 DIAGNOSIS — N400 Enlarged prostate without lower urinary tract symptoms: Secondary | ICD-10-CM | POA: Insufficient documentation

## 2017-09-12 DIAGNOSIS — Z8249 Family history of ischemic heart disease and other diseases of the circulatory system: Secondary | ICD-10-CM | POA: Insufficient documentation

## 2017-09-12 DIAGNOSIS — Z800 Family history of malignant neoplasm of digestive organs: Secondary | ICD-10-CM | POA: Insufficient documentation

## 2017-09-12 DIAGNOSIS — K219 Gastro-esophageal reflux disease without esophagitis: Secondary | ICD-10-CM | POA: Insufficient documentation

## 2017-09-12 DIAGNOSIS — G4733 Obstructive sleep apnea (adult) (pediatric): Secondary | ICD-10-CM | POA: Insufficient documentation

## 2017-09-12 DIAGNOSIS — M25512 Pain in left shoulder: Secondary | ICD-10-CM | POA: Insufficient documentation

## 2017-09-12 DIAGNOSIS — F22 Delusional disorders: Secondary | ICD-10-CM | POA: Insufficient documentation

## 2017-09-12 DIAGNOSIS — Z0001 Encounter for general adult medical examination with abnormal findings: Secondary | ICD-10-CM | POA: Insufficient documentation

## 2017-09-12 DIAGNOSIS — R002 Palpitations: Secondary | ICD-10-CM | POA: Insufficient documentation

## 2017-09-12 DIAGNOSIS — M1711 Unilateral primary osteoarthritis, right knee: Secondary | ICD-10-CM | POA: Insufficient documentation

## 2017-09-12 DIAGNOSIS — R0981 Nasal congestion: Secondary | ICD-10-CM | POA: Insufficient documentation

## 2017-09-12 DIAGNOSIS — Z801 Family history of malignant neoplasm of trachea, bronchus and lung: Secondary | ICD-10-CM | POA: Insufficient documentation

## 2017-09-12 MED ORDER — MELOXICAM 7.5 MG PO TABS: 7.5000 mg | ORAL_TABLET | Freq: Every day | ORAL | 3 refills | Status: DC

## 2017-09-12 MED FILL — MELOXICAM 7.5 MG TABLET: 7.5 | 30 days supply | Qty: 30 | Fill #0
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
## 2017-09-13 LAB — TSH: TSH: 0.826 u[IU]/mL (ref 0.450–4.500)

## 2017-09-18 ENCOUNTER — Encounter: Payer: Self-pay | Admitting: Internal Medicine

## 2017-09-19 ENCOUNTER — Ambulatory Visit (INDEPENDENT_AMBULATORY_CARE_PROVIDER_SITE_OTHER): Payer: Self-pay | Admitting: Urology

## 2017-09-19 DIAGNOSIS — I861 Scrotal varices: Secondary | ICD-10-CM

## 2017-09-19 DIAGNOSIS — D2930 Benign neoplasm of unspecified epididymis: Secondary | ICD-10-CM

## 2017-09-24 MED FILL — PENICILLIN VK 500 MG TABLET: 500 | 7 days supply | Qty: 28 | Fill #0

## 2017-09-24 MED FILL — IBUPROFEN 800 MG TABLET: 800 | 7 days supply | Qty: 30 | Fill #0

## 2017-09-25 ENCOUNTER — Other Ambulatory Visit: Payer: Self-pay | Admitting: Internal Medicine

## 2017-09-25 DIAGNOSIS — I10 Essential (primary) hypertension: Secondary | ICD-10-CM

## 2017-09-25 MED FILL — ?TAMSULOSIN HCL 0.4 MG CAP: 0.4 | 30 days supply | Qty: 30 | Fill #6

## 2017-09-25 MED FILL — HYDROCHLOROTHIAZIDE 25 MG T: 25 | 30 days supply | Qty: 30 | Fill #0

## 2017-09-25 MED FILL — VIT D2 1.25 MG (50,000 UNIT: 1.25 MG | 28 days supply | Qty: 4 | Fill #1

## 2017-09-25 MED FILL — ?ACYCLOVIR 400 TAB: 400 | 30 days supply | Qty: 60 | Fill #1

## 2017-10-03 MED FILL — PENICILLIN VK 500 MG TABLET: 500 | 7 days supply | Qty: 28 | Fill #0

## 2017-10-09 ENCOUNTER — Ambulatory Visit: Payer: Self-pay | Attending: Family Medicine | Admitting: Physician Assistant

## 2017-10-09 VITALS — BP 115/73 | HR 63 | Temp 98.0°F | Ht 68.0 in | Wt 220.2 lb

## 2017-10-09 DIAGNOSIS — K219 Gastro-esophageal reflux disease without esophagitis: Secondary | ICD-10-CM | POA: Insufficient documentation

## 2017-10-09 DIAGNOSIS — G4730 Sleep apnea, unspecified: Secondary | ICD-10-CM | POA: Insufficient documentation

## 2017-10-09 DIAGNOSIS — I10 Essential (primary) hypertension: Secondary | ICD-10-CM | POA: Insufficient documentation

## 2017-10-09 DIAGNOSIS — Z7982 Long term (current) use of aspirin: Secondary | ICD-10-CM | POA: Insufficient documentation

## 2017-10-09 DIAGNOSIS — M79671 Pain in right foot: Secondary | ICD-10-CM | POA: Insufficient documentation

## 2017-10-09 DIAGNOSIS — Z79899 Other long term (current) drug therapy: Secondary | ICD-10-CM | POA: Insufficient documentation

## 2017-10-09 DIAGNOSIS — Z791 Long term (current) use of non-steroidal anti-inflammatories (NSAID): Secondary | ICD-10-CM | POA: Insufficient documentation

## 2017-10-09 MED ORDER — OMEPRAZOLE 20 MG PO CPDR: 20.0000 mg | DELAYED_RELEASE_CAPSULE | Freq: Every day | ORAL | 3 refills | Status: DC | PRN

## 2017-10-09 MED FILL — ?OMEPRazole 20mg CPDR: 20 | 30 days supply | Qty: 30 | Fill #0

## 2017-10-09 NOTE — Progress Notes (Signed)
Patient has burning in right foot while standing.

## 2017-10-09 NOTE — Progress Notes (Signed)
Patient ID: Samuel Irwin, male   DOB: [birth year], 57 y.o.   MRN: [id number]   Samuel Irwin, is a 57 y.o. male  YSA:[id number]  NAT:[id number]  DOB - [birth year]  Subjective:  Chief Complaint and HPI: Samuel Irwin is a 57 y.o. male here today for burning on the bottom of his R foot for 1-2 weeks.  NKI.  He did switch shoes recently.  Describes as burning pain in the bottom of his foot when he stands or walks long periods of time.  He is concerned about a blood clot.  No calf pain/leg pain.  No SOB.  No CP.  Ne recent travel/flight  He is active and goes to the gym 1 or more times/week.    He does c/o midepigastric burning at times that is made better with food and is exacerbated by hunger.  Denies N/V/D.  This has been going on several months.  No melena/hematochezia.  He is concerned about ulcers.    He admits to a lot of stress.  He takes care of his mom that has cancer.  Social:   Caring for his sick mom, has a gf, not married  ROS:   Constitutional:  No f/c, No night sweats, No unexplained weight loss. EENT:  No vision changes, No blurry vision, No hearing changes. No mouth, throat, or ear problems.  Respiratory: No cough, No SOB Cardiac: No CP, no palpitations GI:  midepigastric burning, No N/V/D. GU: No Urinary s/sx Musculoskeletal: bottom of R foot pain Neuro: No headache, no dizziness, no motor weakness.  Skin: No rash Endocrine:  No polydipsia. No polyuria.  Psych: Denies SI/HI  No problems updated.  ALLERGIES: No Known Allergies  PAST MEDICAL HISTORY: Past Medical History:  Diagnosis Date  . Constipation   . Cough   . Generalized headaches   . GERD (gastroesophageal reflux disease)   . H/O hiatal hernia   . Heart murmur   . Herpes   . Hypertension   . Sleep apnea    uses BIPAP  . Umbilical hernia   . Weakness   . Wheezing     MEDICATIONS AT HOME: Prior to Admission medications   Medication Sig Start Date End Date Taking? Authorizing Provider    acyclovir (ZOVIRAX) 400 MG tablet Take 1 tablet (400 mg total) by mouth 2 (two) times daily. 08/22/17  Yes Ladell Pier, MD  aspirin EC 81 MG tablet Take 81 mg by mouth daily.   Yes [provider]  clotrimazole-betamethasone (LOTRISONE) lotion Apply topically 2 (two) times daily. To B feet between toes for 3 weeks 12/10/16  Yes [name] M, PA-C  fluticasone [facility]) 50 MCG/ACT nasal spray Place 2 sprays into both nostrils daily. 05/04/15  Yes Funches, Josalyn, MD  hydrochlorothiazide (HYDRODIURIL) 25 MG tablet TAKE 1 TABLET BY MOUTH DAILY. 09/25/17  Yes Ladell Pier, MD  meloxicam (MOBIC) 7.5 MG tablet Take 1 tablet (7.5 mg total) by mouth daily. 09/12/17  Yes Ladell Pier, MD  Multiple Vitamin (MULTIVITAMIN) capsule Take 1 capsule by mouth daily.   Yes [provider]  omeprazole (PRILOSEC) 20 MG capsule Take 1 capsule (20 mg total) by mouth daily as needed (heart burn). 10/09/17  Yes [name] M, PA-C  tadalafil (CIALIS) 10 MG tablet Take 2 tablets daily as needed for erectile dysfunction. 03/10/17  Yes Ladell Pier, MD  tamsulosin (FLOMAX) 0.4 MG CAPS capsule Take 1 capsule (0.4 mg total) by mouth daily. 03/10/17  Yes Ladell Pier,  MD  Vitamin D, Ergocalciferol, (DRISDOL) 50000 units CAPS capsule Take 1 capsule (50,000 Units total) by mouth every 7 (seven) days. 07/16/17  Yes Ladell Pier, MD     Objective:  EXAM:   Vitals:   10/09/17 1519  BP: 115/73  Pulse: 63  Temp: 98 F (36.7 C)  TempSrc: Oral  SpO2: 98%  Weight: 220 lb 3.2 oz (99.9 kg)  Height: 5\' 8"  (1.727 m)    General appearance : A&OX3. NAD. Non-toxic-appearing HEENT: Atraumatic and Normocephalic.  PERRLA. EOM intact.  TM clear B. Mouth-MMM, post pharynx WNL w/o erythema, No PND. Neck: supple, no JVD. No cervical lymphadenopathy. No thyromegaly Chest/Lungs:  Breathing-non-labored, Good air entry bilaterally, breath sounds normal without rales, rhonchi, or  wheezing  CVS: S1 S2 regular, no murmurs, gallops, rubs  Abdomen: Bowel sounds present, Non tender and not distended with no gaurding, rigidity or rebound. Extremities: Bilateral Lower Ext shows no edema, both legs are warm to touch with = pulse throughout.  B feet examined-sensory intact-no gross abnormality of either foot/sole of feet.  B calves w/o erythema or swelling.  Neg Homan's B.  DTR=intact BLE.   Neurology:  CN II-XII grossly intact, Non focal.   Psych:  TP linear. J/I WNL. pressured speech. Appropriate eye contact and affect.  Skin:  No Rash  Data Review Lab Results  Component Value Date   HGBA1C 5.50 01/06/2015   HGBA1C 5.3 05/18/2013   HGBA1C 5.5 03/20/2012     Assessment & Plan   1. Gastroesophageal reflux disease, esophagitis presence not specified - H. pylori breath test - omeprazole (PRILOSEC) 20 MG capsule; Take 1 capsule (20 mg total) by mouth daily as needed (heart burn).  Dispense: 90 capsule; Refill: 3  2. Right foot pain No abnormality on exam.  No s/sx blood clot at this visit.  Reassurance provided.    3. Essential hypertension Controlled-continue current regimen   Patient have been counseled extensively about nutrition and exercise  Return for keep 02/12/2018 appt with Dr [name].  The patient was given clear instructions to go to ER or return to medical center if symptoms don't improve, worsen or new problems develop. The patient verbalized understanding. The patient was told to call to get lab results if they haven't heard anything in the next week.     [name], PA-C [facility] and [facility] [place], [place]   10/09/2017, 3:31 PM

## 2017-10-10 LAB — H. PYLORI BREATH TEST: H pylori Breath Test: NEGATIVE

## 2017-10-13 ENCOUNTER — Telehealth: Payer: Self-pay

## 2017-10-13 NOTE — Telephone Encounter (Signed)
-----   Message from Argentina Donovan, [place] sent at 10/11/2017  9:25 AM EDT ----- Your stomach ulcer test was negative.  Take the omeprazole.  Follow-up with your primary care provider.  Thanks, [name], PA-C

## 2017-10-13 NOTE — Telephone Encounter (Signed)
CMA spoke to patient to inform on lab results.  Patient verified DOB. Patient understood.  

## 2017-10-28 ENCOUNTER — Ambulatory Visit (HOSPITAL_COMMUNITY)
Admission: EM | Admit: 2017-10-28 | Discharge: 2017-10-28 | Disposition: A | Discharge status: Home / Self Care | Payer: Self-pay | Attending: Family Medicine | Admitting: Family Medicine

## 2017-10-28 ENCOUNTER — Ambulatory Visit (HOSPITAL_COMMUNITY): Payer: Self-pay

## 2017-10-28 ENCOUNTER — Other Ambulatory Visit: Payer: Self-pay | Admitting: Internal Medicine

## 2017-10-28 ENCOUNTER — Ambulatory Visit (INDEPENDENT_AMBULATORY_CARE_PROVIDER_SITE_OTHER): Payer: Self-pay

## 2017-10-28 ENCOUNTER — Encounter (HOSPITAL_COMMUNITY): Payer: Self-pay | Admitting: Emergency Medicine

## 2017-10-28 DIAGNOSIS — N400 Enlarged prostate without lower urinary tract symptoms: Secondary | ICD-10-CM

## 2017-10-28 DIAGNOSIS — M25512 Pain in left shoulder: Secondary | ICD-10-CM

## 2017-10-28 DIAGNOSIS — M25712 Osteophyte, left shoulder: Secondary | ICD-10-CM

## 2017-10-28 MED ORDER — MECLIZINE HCL 12.5 MG PO TABS: 12.5000 mg | ORAL_TABLET | Freq: Three times a day (TID) | ORAL | 0 refills | Status: DC | PRN

## 2017-10-28 MED ORDER — DICLOFENAC SODIUM 50 MG PO TBEC: 50.0000 mg | DELAYED_RELEASE_TABLET | Freq: Two times a day (BID) | ORAL | 0 refills | Status: DC

## 2017-10-28 MED FILL — HYDROCHLOROTHIAZIDE 25 MG T: 25 | 30 days supply | Qty: 30 | Fill #1

## 2017-10-28 MED FILL — DICLOFENAC SOD EC 50 MG TAB: 50 | 15 days supply | Qty: 30 | Fill #0

## 2017-10-28 MED FILL — ?TAMSULOSIN HCL 0.4 MG CAP: 0.4 | 30 days supply | Qty: 30 | Fill #0

## 2017-10-28 MED FILL — MECLIZINE 25 MG TABLET: 25 | 10 days supply | Qty: 15 | Fill #0

## 2017-10-28 MED FILL — VIT D2 1.25 MG (50,000 UNIT: 1.25 MG | 28 days supply | Qty: 4 | Fill #2

## 2017-10-28 NOTE — ED Provider Notes (Addendum)
[place]    CSN: [id number] Arrival date & time: 10/28/17  3295     History   Chief Complaint Chief Complaint  Patient presents with  . Fall    HPI Samuel Irwin is a 57 y.o. male history of hypertension, BPH, GERD, OSA presenting today for evaluation after a fall.  Patient states that yesterday he was trimming bushes, he forgot he had moved a branch and accidentally tripped over this branch.  He had noted trimmers in his hands, hit his head behind his right ear and noted some bleeding.  He also has since had left shoulder pain, left elbow pain, left hand pain as well as bilateral knee pain.  He has been walking okay, denies difficulty moving his knees.  His main complaint is his left shoulder as he has had difficulty moving this in extreme pain.  He denies numbness or tingling radiating into his arm.  Denies changes in vision or loss of consciousness with fall.  HPI  Past Medical History:  Diagnosis Date  . Constipation   . Cough   . Generalized headaches   . GERD (gastroesophageal reflux disease)   . H/O hiatal hernia   . Heart murmur   . Herpes   . Hypertension   . Sleep apnea    uses BIPAP  . Umbilical hernia   . Weakness   . Wheezing     Patient Active Problem List   Diagnosis Date Noted  . Scrotal mass 08/04/2017  . Obesity (BMI 35.0-39.9 without comorbidity) 03/10/2017  . Pain in metatarsus, bilateral 09/23/2016  . Floaters in visual field, right 09/23/2016  . Osteoarthritis of right knee 02/28/2016  . Chronic bilateral low back pain 02/28/2016  . Poor dentition 01/29/2016  . Essential hypertension 06/06/2015  . BPH (benign prostatic hyperplasia) 05/04/2015  . Other seasonal allergic rhinitis 05/04/2015  . Target of perceived adverse discrimination or persecution 04/05/2015  . Hemorrhoids 02/23/2015  . Constipation 02/23/2015  . GERD (gastroesophageal reflux disease) 09/21/2014  . Carpal tunnel syndrome 08/12/2014  . Erectile dysfunction  08/20/2013  . Dyslipidemia 08/20/2013  . Morbid obesity ([place]) 08/20/2013  . OSA (obstructive sleep apnea) 08/24/2012    Past Surgical History:  Procedure Laterality Date  . BIOPSY  10/18/2014   Procedure: BIOPSY (Gastric);  Surgeon: Danie Binder, MD;  Location: [facility];  Service: Endoscopy;;  . CARPAL TUNNEL RELEASE     R hand  . COLONOSCOPY WITH PROPOFOL N/A 10/18/2014   JOA:CZYSAYTK size external hemorrhoids/left colon is redundant  . ESOPHAGOGASTRODUODENOSCOPY (EGD) WITH PROPOFOL N/A 10/18/2014   SLF: epigastric pain due to moderate NSAID gastritis  . FEMUR SURGERY     left  . HERNIA REPAIR  04/07/12   umb hernia repair  . INSERTION OF MESH N/A 04/07/2012   Procedure: INSERTION OF MESH;  Surgeon: Madilyn Hook, DO;  Location: [place];  Service: General;  Laterality: N/A;  . KNEE ARTHROSCOPY     right  . MINOR CARPAL TUNNEL Left 01/31/2016   Procedure: MINOR CARPAL TUNNEL RELEASE LEFT;  Surgeon: Charlotte Crumb, MD;  Location: [place];  Service: Orthopedics;  Laterality: Left;  Local  . TOE SURGERY     dislocated lt foot  . UMBILICAL HERNIA REPAIR N/A 04/07/2012   Procedure: HERNIA REPAIR UMBILICAL ADULT;  Surgeon: Madilyn Hook, DO;  Location: [facility];  Service: General;  Laterality: N/A;  open umbilical hernia repair with mesh       Home Medications  Prior to Admission medications   Medication Sig Start Date End Date Taking? Authorizing Provider  acyclovir (ZOVIRAX) 400 MG tablet Take 1 tablet (400 mg total) by mouth 2 (two) times daily. 08/22/17  Yes Ladell Pier, MD  fluticasone (FLONASE) 50 MCG/ACT nasal spray Place 2 sprays into both nostrils daily. 05/04/15  Yes Funches, Josalyn, MD  hydrochlorothiazide (HYDRODIURIL) 25 MG tablet TAKE 1 TABLET BY MOUTH DAILY. 09/25/17  Yes Ladell Pier, MD  meloxicam (MOBIC) 7.5 MG tablet Take 1 tablet (7.5 mg total) by mouth daily. 09/12/17  Yes Ladell Pier, MD  Multiple Vitamin (MULTIVITAMIN) capsule Take 1  capsule by mouth daily.   Yes [provider]  omeprazole (PRILOSEC) 20 MG capsule Take 1 capsule (20 mg total) by mouth daily as needed (heart burn). 10/09/17  Yes [name] M, PA-C  tadalafil (CIALIS) 10 MG tablet Take 2 tablets daily as needed for erectile dysfunction. 03/10/17  Yes Ladell Pier, MD  tamsulosin (FLOMAX) 0.4 MG CAPS capsule Take 1 capsule (0.4 mg total) by mouth daily. 03/10/17  Yes Ladell Pier, MD  aspirin EC 81 MG tablet Take 81 mg by mouth daily.    [provider]  clotrimazole-betamethasone (LOTRISONE) lotion Apply topically 2 (two) times daily. To B feet between toes for 3 weeks 12/10/16   Argentina Donovan, PA-C  diclofenac (VOLTAREN) 50 MG EC tablet Take 1 tablet (50 mg total) by mouth 2 (two) times daily. 10/28/17   Merisa Julio C, PA-C  meclizine (ANTIVERT) 12.5 MG tablet Take 1 tablet (12.5 mg total) by mouth 3 (three) times daily as needed for dizziness. 10/28/17   Jaidon Sponsel C, PA-C  Vitamin D, Ergocalciferol, (DRISDOL) 50000 units CAPS capsule Take 1 capsule (50,000 Units total) by mouth every 7 (seven) days. 07/16/17   Ladell Pier, MD    Family History Family History  Problem Relation Age of Onset  . Hypertension Mother   . Cancer Father        lung  . Colon cancer Neg Hx     Social History Social History   Tobacco Use  . Smoking status: Current Some Day Smoker    Packs/day: 0.25    Years: 43.00    Pack years: 10.75    Types: Cigarettes  . Smokeless tobacco: Never Used  . Tobacco comment: occas still smokes when he drinks  Substance Use Topics  . Alcohol use: Yes    Alcohol/week: 0.0 standard drinks    Comment: once weekly  . Drug use: No    Types: Cocaine, "Crack" cocaine    Comment: smoked crack 1 wk ago     Allergies   Patient has no known allergies.   Review of Systems Review of Systems  Constitutional: Negative for activity change, chills, diaphoresis and fatigue.  HENT: Negative for ear  pain, tinnitus and trouble swallowing.   Eyes: Negative for photophobia and visual disturbance.  Respiratory: Negative for cough, chest tightness and shortness of breath.   Cardiovascular: Negative for chest pain and leg swelling.  Gastrointestinal: Negative for abdominal pain, blood in stool, nausea and vomiting.  Musculoskeletal: Positive for arthralgias and myalgias. Negative for back pain, gait problem, neck pain and neck stiffness.  Skin: Positive for wound. Negative for color change.  Neurological: Negative for dizziness, weakness, light-headedness, numbness and headaches.     Physical Exam Triage Vital Signs ED Triage Vitals  Enc Vitals Group     BP 10/28/17 0908 127/85  Pulse Rate 10/28/17 0908 70     Resp 10/28/17 0908 16     Temp 10/28/17 0908 97.8 F (36.6 C)     Temp Source 10/28/17 0908 Oral     SpO2 10/28/17 0908 100 %     Weight 10/28/17 0915 220 lb (99.8 kg)     Height --      Head Circumference --      Peak Flow --      Pain Score 10/28/17 0915 10     Pain Loc --      Pain Edu? --      Excl. in [place]? --    No data found.  Updated Vital Signs BP 127/85 (BP Location: Left Arm)   Pulse 70   Temp 97.8 F (36.6 C) (Oral)   Resp 16   Wt 220 lb (99.8 kg)   SpO2 100%   BMI 33.45 kg/m   Visual Acuity Right Eye Distance:   Left Eye Distance:   Bilateral Distance:    Right Eye Near:   Left Eye Near:    Bilateral Near:     Physical Exam  Constitutional: He is oriented to person, place, and time. He appears well-developed and well-nourished.  HENT:  Head: Normocephalic and atraumatic.  Mouth/Throat: Oropharynx is clear and moist.  Eyes: Pupils are equal, round, and reactive to light. Conjunctivae and EOM are normal.  Neck: Neck supple.  Cardiovascular: Normal rate and regular rhythm.  No murmur heard. Pulmonary/Chest: Effort normal. No respiratory distress.  Abdominal: Soft. There is no tenderness.  Musculoskeletal: He exhibits no edema.  Full  passive range of motion of left shoulder, limited active range of motion, painful liftoff, passive arc as well as resisted external rotation  Left elbow: Full active range of motion, superficial scraping and swelling to posterior aspect  Left hand, full active range of motion of all fingers and wrist.  Bilateral knees with full active range of motion, strength 5/5  Gait without abnormality  Neurological: He is alert and oriented to person, place, and time. No cranial nerve deficit. Coordination normal.  Skin: Skin is warm and dry.  Small very superficial abrasion behind right ear and left ear  Psychiatric: He has a normal mood and affect.  Nursing note and vitals reviewed.    UC Treatments / Results  Labs (all labs ordered are listed, but only abnormal results are displayed) Labs Reviewed - No data to display  EKG None  Radiology Dg Shoulder Left  Result Date: 10/28/2017 CLINICAL DATA:  Fall.  Shoulder pain.  Difficulty raising arm. EXAM: LEFT SHOULDER - 2+ VIEW COMPARISON:  07/20/2003. FINDINGS: Acromioclavicular and glenohumeral degenerative change. Subacromial spurring. No acute abnormality. Soft tissues are unremarkable. IMPRESSION: Acromioclavicular glenohumeral degenerative change. Subacromial spurring. No acute abnormality. Electronically Signed   By: Marcello Moores  Register   On: 10/28/2017 10:00    Procedures Procedures (including critical care time)  Medications Ordered in UC Medications - No data to display  Initial Impression / Assessment and Plan / UC Course  I have reviewed the triage vital signs and the nursing notes.  Pertinent labs & imaging results that were available during my care of the patient were reviewed by me and considered in my medical decision making (see chart for details).     X-ray showing spurring and AC joint, possible flare of this from fall versus rotator cuff tendinopathy.  Will provide sling for comfort, shoulder range of motion exercises as  well as anti-inflammatories.  Follow-up with  orthopedics for persistent pain or decreased continued decreased difficulty with movement.  No focal neuro deficit, lesions from fall appears superficial, vital signs stable.  Discussed strict return precautions. Patient verbalized understanding and is agreeable with plan.  At the end of visit patient stated he had been feeling dizzy over the past few weeks, states that he has had a similar sensation previously had improved with taking meclizine, requesting refill of this.  Discussed with patient that dizziness is a complex complaint needs a more in-depth work-up.  Discussed that I will provide it given he has had similar sensations previously, but if symptoms are not improving or worsening he needs to be further evaluated for this dizziness.  This dizziness also is not new since the fall.  Final Clinical Impressions(s) / UC Diagnoses   Final diagnoses:  Acute pain of left shoulder     Discharge Instructions     Use anti-inflammatories for pain/swelling. You may take up to 800 mg Ibuprofen every 8 hours OR Diclofenac twice daily with food. You may supplement with Tylenol [phone] mg every 8 hours.   Ice shoulder  Slowly work on range of motion exercises  Follow up with orthopedics   ED Prescriptions    Medication Sig Dispense Auth. Provider   diclofenac (VOLTAREN) 50 MG EC tablet Take 1 tablet (50 mg total) by mouth 2 (two) times daily. 30 tablet Raeana Blinn C, PA-C   meclizine (ANTIVERT) 12.5 MG tablet Take 1 tablet (12.5 mg total) by mouth 3 (three) times daily as needed for dizziness. 30 tablet Tonya Wantz, Yaak C, PA-C     Controlled Substance Prescriptions [place] Controlled Substance Registry consulted? Not Applicable   Janith Lima, PA-C 10/28/17 1045    Jaylynn Siefert, [place] C, [place] 10/28/17 1046

## 2017-10-28 NOTE — Discharge Instructions (Addendum)
Use anti-inflammatories for pain/swelling. You may take up to 800 mg Ibuprofen every 8 hours OR Diclofenac twice daily with food. You may supplement with Tylenol [phone] every 8 hours.   Ice shoulder  Slowly work on range of motion exercises  Follow up with orthopedics

## 2017-10-28 NOTE — ED Triage Notes (Signed)
PT fell in his driveway last night while trimming the hedges yesterday. PT threw the hedge trimmer and it scraped the right side of his head. PT has some swelling underneath. PT reports pain in left shoulder, left elbow, left knee, and right knee. PT does not take blood thinners.

## 2017-11-05 MED FILL — ACYCLOVIR 400 MG TABLET: 400 | 30 days supply | Qty: 60 | Fill #2

## 2017-11-06 ENCOUNTER — Telehealth: Payer: Self-pay | Admitting: Pulmonary Disease

## 2017-11-06 DIAGNOSIS — G4733 Obstructive sleep apnea (adult) (pediatric): Secondary | ICD-10-CM

## 2017-11-06 NOTE — Telephone Encounter (Signed)
Spoke with patient, patient reports his cpap mask is not fitting correctly, frequently falls off, does not have a good seal and feels like it is always leaking.    VS please advise if okay to place order for mask desensitization.

## 2017-11-06 NOTE — Telephone Encounter (Signed)
Attempted to call patient today regarding cpap mask fitting. I did not receive an answer at time of call. I have left a voicemail message for pt to return call. X1

## 2017-11-06 NOTE — Telephone Encounter (Signed)
Pt is calling back [phone]

## 2017-11-07 NOTE — Telephone Encounter (Signed)
Spoke with patient. He was following up on his request for a new bipap mask. He stated that the current one is leaking at the top where the hose connects to the mask.   Advised him that we are waiting on VS' response.   VS, please advise.

## 2017-11-07 NOTE — Telephone Encounter (Signed)
Spoke with pt, he would like another sample of the mask. I advised him that we are not a DME company and he would have to get his supplies through [facility]. Pt stated that [facility] mask were too expensive and his is not working because he is taking care of his mother. Sr. Halford Chessman do you have another mask that you provide for him. Please advise.

## 2017-11-07 NOTE — Telephone Encounter (Signed)
Okay to send order for new mask.

## 2017-11-07 NOTE — Telephone Encounter (Signed)
Patient is here in the lobby and wanted to speak to someone about getting a bi pap mask.

## 2017-11-07 NOTE — Telephone Encounter (Signed)
Attempted to call pt. I did not receive an answer. I have left a message for pt to return our call.  

## 2017-11-10 ENCOUNTER — Encounter

## 2017-11-10 ENCOUNTER — Ambulatory Visit: Payer: Self-pay | Attending: Internal Medicine | Admitting: Internal Medicine

## 2017-11-10 ENCOUNTER — Encounter: Payer: Self-pay | Admitting: Internal Medicine

## 2017-11-10 VITALS — BP 113/79 | HR 66 | Temp 98.1°F | Resp 16 | Wt 225.0 lb

## 2017-11-10 DIAGNOSIS — I10 Essential (primary) hypertension: Secondary | ICD-10-CM | POA: Insufficient documentation

## 2017-11-10 DIAGNOSIS — M25512 Pain in left shoulder: Secondary | ICD-10-CM | POA: Insufficient documentation

## 2017-11-10 DIAGNOSIS — Z7982 Long term (current) use of aspirin: Secondary | ICD-10-CM | POA: Insufficient documentation

## 2017-11-10 DIAGNOSIS — Z79899 Other long term (current) drug therapy: Secondary | ICD-10-CM | POA: Insufficient documentation

## 2017-11-10 DIAGNOSIS — M1711 Unilateral primary osteoarthritis, right knee: Secondary | ICD-10-CM | POA: Insufficient documentation

## 2017-11-10 DIAGNOSIS — F17210 Nicotine dependence, cigarettes, uncomplicated: Secondary | ICD-10-CM | POA: Insufficient documentation

## 2017-11-10 DIAGNOSIS — K219 Gastro-esophageal reflux disease without esophagitis: Secondary | ICD-10-CM | POA: Insufficient documentation

## 2017-11-10 DIAGNOSIS — G8929 Other chronic pain: Secondary | ICD-10-CM | POA: Insufficient documentation

## 2017-11-10 DIAGNOSIS — G5603 Carpal tunnel syndrome, bilateral upper limbs: Secondary | ICD-10-CM | POA: Insufficient documentation

## 2017-11-10 DIAGNOSIS — Z23 Encounter for immunization: Secondary | ICD-10-CM | POA: Insufficient documentation

## 2017-11-10 DIAGNOSIS — E785 Hyperlipidemia, unspecified: Secondary | ICD-10-CM | POA: Insufficient documentation

## 2017-11-10 DIAGNOSIS — G4733 Obstructive sleep apnea (adult) (pediatric): Secondary | ICD-10-CM | POA: Insufficient documentation

## 2017-11-10 DIAGNOSIS — N400 Enlarged prostate without lower urinary tract symptoms: Secondary | ICD-10-CM | POA: Insufficient documentation

## 2017-11-10 DIAGNOSIS — M545 Low back pain: Secondary | ICD-10-CM | POA: Insufficient documentation

## 2017-11-10 DIAGNOSIS — Z6834 Body mass index (BMI) 34.0-34.9, adult: Secondary | ICD-10-CM | POA: Insufficient documentation

## 2017-11-10 DIAGNOSIS — Z7951 Long term (current) use of inhaled steroids: Secondary | ICD-10-CM | POA: Insufficient documentation

## 2017-11-10 DIAGNOSIS — N529 Male erectile dysfunction, unspecified: Secondary | ICD-10-CM | POA: Insufficient documentation

## 2017-11-10 NOTE — Progress Notes (Signed)
Patient ID: SHO SALGUERO, male    DOB: Feb 19, 1961  MRN: [id number]  CC: Hospitalization Follow-up (urgent care f/u)   Subjective: Samuel Irwin is a 57 y.o. male who presents for ER f/u His concerns today include:  Pt with hxo fo HTN, GERD, ED, tob dep, BPH, OSA on Bipap   Patient seen in the [facility] 10/28/2017 post mechanical fall.  He tripped and fell over a branch in his yard.  He presented to the emergency room complaining of pain in the left shoulder, elbow and hand and bilateral knees.  Most of the pain was in the left shoulder.  X-ray of the left shoulder revealed some arthritis of the acromioclavicular joint.  Patient discharged with sling for the left shoulder and Voltaren 50 mg twice daily. Today he reports that his shoulder is doing much better.  Pain completely stopped as of yesterday.  He can now move it.  Still gets a little aching at nights.  He found the Voltaren helpful.  Patient Active Problem List   Diagnosis Date Noted  . Scrotal mass 08/04/2017  . Obesity (BMI 35.0-39.9 without comorbidity) 03/10/2017  . Pain in metatarsus, bilateral 09/23/2016  . Floaters in visual field, right 09/23/2016  . Osteoarthritis of right knee 02/28/2016  . Chronic bilateral low back pain 02/28/2016  . Poor dentition 01/29/2016  . Essential hypertension 06/06/2015  . BPH (benign prostatic hyperplasia) 05/04/2015  . Other seasonal allergic rhinitis 05/04/2015  . Target of perceived adverse discrimination or persecution 04/05/2015  . Hemorrhoids 02/23/2015  . Constipation 02/23/2015  . GERD (gastroesophageal reflux disease) 09/21/2014  . Carpal tunnel syndrome 08/12/2014  . Erectile dysfunction 08/20/2013  . Dyslipidemia 08/20/2013  . Morbid obesity ([place]) 08/20/2013  . OSA (obstructive sleep apnea) 08/24/2012     Current Outpatient Medications on File Prior to Visit  Medication Sig Dispense Refill  . acyclovir (ZOVIRAX) 400 MG tablet Take 1 tablet (400 mg total) by mouth 2 (two)  times daily. 60 tablet 5  . aspirin EC 81 MG tablet Take 81 mg by mouth daily.    . clotrimazole-betamethasone (LOTRISONE) lotion Apply topically 2 (two) times daily. To B feet between toes for 3 weeks 30 mL 1  . diclofenac (VOLTAREN) 50 MG EC tablet Take 1 tablet (50 mg total) by mouth 2 (two) times daily. 30 tablet 0  . fluticasone (FLONASE) 50 MCG/ACT nasal spray Place 2 sprays into both nostrils daily. 16 g 6  . hydrochlorothiazide (HYDRODIURIL) 25 MG tablet TAKE 1 TABLET BY MOUTH DAILY. 30 tablet 2  . meclizine (ANTIVERT) 12.5 MG tablet Take 1 tablet (12.5 mg total) by mouth 3 (three) times daily as needed for dizziness. 30 tablet 0  . meloxicam (MOBIC) 7.5 MG tablet Take 1 tablet (7.5 mg total) by mouth daily. 30 tablet 3  . Multiple Vitamin (MULTIVITAMIN) capsule Take 1 capsule by mouth daily.    Marland Kitchen omeprazole (PRILOSEC) 20 MG capsule Take 1 capsule (20 mg total) by mouth daily as needed (heart burn). 90 capsule 3  . tadalafil (CIALIS) 10 MG tablet Take 2 tablets daily as needed for erectile dysfunction. 60 tablet 3  . tamsulosin (FLOMAX) 0.4 MG CAPS capsule TAKE 1 CAPSULE BY MOUTH DAILY. 30 capsule 2  . Vitamin D, Ergocalciferol, (DRISDOL) 50000 units CAPS capsule Take 1 capsule (50,000 Units total) by mouth every 7 (seven) days. 16 capsule 0   No current facility-administered medications on file prior to visit.     No Known Allergies  Social  History   Socioeconomic History  . Marital status: Single    Spouse name: Not on file  . Number of children: Not on file  . Years of education: Not on file  . Highest education level: Not on file  Occupational History  . Occupation: unemployed  Social Needs  . Financial resource strain: Not on file  . Food insecurity:    Worry: Not on file    Inability: Not on file  . Transportation needs:    Medical: Not on file    Non-medical: Not on file  Tobacco Use  . Smoking status: Current Some Day Smoker    Packs/day: 0.25    Years: 43.00      Pack years: 10.75    Types: Cigarettes  . Smokeless tobacco: Never Used  . Tobacco comment: occas still smokes when he drinks  Substance and Sexual Activity  . Alcohol use: Yes    Alcohol/week: 0.0 standard drinks    Comment: once weekly  . Drug use: No    Types: Cocaine, "Crack" cocaine    Comment: smoked crack 1 wk ago  . Sexual activity: Not on file    Comment: "when I drink"  Lifestyle  . Physical activity:    Days per week: Not on file    Minutes per session: Not on file  . Stress: Not on file  Relationships  . Social connections:    Talks on phone: Not on file    Gets together: Not on file    Attends religious service: Not on file    Active member of club or organization: Not on file    Attends meetings of clubs or organizations: Not on file    Relationship status: Not on file  . Intimate partner violence:    Fear of current or ex partner: Not on file    Emotionally abused: Not on file    Physically abused: Not on file    Forced sexual activity: Not on file  Other Topics Concern  . Not on file  Social History Narrative  . Not on file    Family History  Problem Relation Age of Onset  . Hypertension Mother   . Cancer Father        lung  . Colon cancer Neg Hx     Past Surgical History:  Procedure Laterality Date  . BIOPSY  10/18/2014   Procedure: BIOPSY (Gastric);  Surgeon: Danie Binder, MD;  Location: [facility];  Service: Endoscopy;;  . CARPAL TUNNEL RELEASE     R hand  . COLONOSCOPY WITH PROPOFOL N/A 10/18/2014   [id number]:[id number] size external hemorrhoids/left colon is redundant  . ESOPHAGOGASTRODUODENOSCOPY (EGD) WITH PROPOFOL N/A 10/18/2014   SLF: epigastric pain due to moderate NSAID gastritis  . FEMUR SURGERY     left  . HERNIA REPAIR  04/07/12   umb hernia repair  . INSERTION OF MESH N/A 04/07/2012   Procedure: INSERTION OF MESH;  Surgeon: Madilyn Hook, DO;  Location: [place];  Service: General;  Laterality: N/A;  . KNEE ARTHROSCOPY     right  . MINOR  CARPAL TUNNEL Left 01/31/2016   Procedure: MINOR CARPAL TUNNEL RELEASE LEFT;  Surgeon: Charlotte Crumb, MD;  Location: [place];  Service: Orthopedics;  Laterality: Left;  Local  . TOE SURGERY     dislocated lt foot  . UMBILICAL HERNIA REPAIR N/A 04/07/2012   Procedure: HERNIA REPAIR UMBILICAL ADULT;  Surgeon: Madilyn Hook, DO;  Location: [facility];  Service: General;  Laterality: N/A;  open umbilical hernia repair with mesh    ROS: Review of Systems  PHYSICAL EXAM: BP 113/79   Pulse 66   Temp 98.1 F (36.7 C) (Oral)   Resp 16   Wt 225 lb (102.1 kg)   SpO2 95%   BMI 34.21 kg/m   Physical Exam  General appearance - alert, well appearing, and in no distress Musculoskeletal - LT shoulder:  No point tenderness.  Good ROM.  Negative drop arm test  ASSESSMENT AND PLAN: 1. Acute pain of left shoulder Resolving.  Exam does not suggest rotator cuff injury.  Patient can continue to use Voltaren as needed.  Advised to hold off on taking meloxicam until he is done with Voltaren.  2. Need for influenza vaccination   Patient was given the opportunity to ask questions.  Patient verbalized understanding of the plan and was able to repeat key elements of the plan.   No orders of the defined types were placed in this encounter.    Requested Prescriptions    No prescriptions requested or ordered in this encounter    No follow-ups on file.  Karle Plumber, MD, FACP

## 2017-11-10 NOTE — Telephone Encounter (Signed)
VS please advise if okay to give patient another Bi-pap mask if available?

## 2017-11-10 NOTE — Telephone Encounter (Signed)
[id number]

## 2017-11-10 NOTE — Patient Instructions (Addendum)
Fall Prevention in the Home Falls can cause injuries. They can happen to people of all ages. There are many things you can do to make your home safe and to help prevent falls. What can I do on the outside of my home?  Regularly fix the edges of walkways and driveways and fix any cracks.  Remove anything that might make you trip as you walk through a door, such as a raised step or threshold.  Trim any bushes or trees on the path to your home.  Use bright outdoor lighting.  Clear any walking paths of anything that might make someone trip, such as rocks or tools.  Regularly check to see if handrails are loose or broken. Make sure that both sides of any steps have handrails.  Any raised decks and porches should have guardrails on the edges.  Have any leaves, snow, or ice cleared regularly.  Use sand or salt on walking paths during winter.  Clean up any spills in your garage right away. This includes oil or grease spills. What can I do in the bathroom?  Use night lights.  Install grab bars by the toilet and in the tub and shower. Do not use towel bars as grab bars.  Use non-skid mats or decals in the tub or shower.  If you need to sit down in the shower, use a plastic, non-slip stool.  Keep the floor dry. Clean up any water that spills on the floor as soon as it happens.  Remove soap buildup in the tub or shower regularly.  Attach bath mats securely with double-sided non-slip rug tape.  Do not have throw rugs and other things on the floor that can make you trip. What can I do in the bedroom?  Use night lights.  Make sure that you have a light by your bed that is easy to reach.  Do not use any sheets or blankets that are too big for your bed. They should not hang down onto the floor.  Have a firm chair that has side arms. You can use this for support while you get dressed.  Do not have throw rugs and other things on the floor that can make you trip. What can I do in the  kitchen?  Clean up any spills right away.  Avoid walking on wet floors.  Keep items that you use a lot in easy-to-reach places.  If you need to reach something above you, use a strong step stool that has a grab bar.  Keep electrical cords out of the way.  Do not use floor polish or wax that makes floors slippery. If you must use wax, use non-skid floor wax.  Do not have throw rugs and other things on the floor that can make you trip. What can I do with my stairs?  Do not leave any items on the stairs.  Make sure that there are handrails on both sides of the stairs and use them. Fix handrails that are broken or loose. Make sure that handrails are as long as the stairways.  Check any carpeting to make sure that it is firmly attached to the stairs. Fix any carpet that is loose or worn.  Avoid having throw rugs at the top or bottom of the stairs. If you do have throw rugs, attach them to the floor with carpet tape.  Make sure that you have a light switch at the top of the stairs and the bottom of the stairs. If you do   not have them, ask someone to add them for you. What else can I do to help prevent falls?  Wear shoes that: ? Do not have high heels. ? Have rubber bottoms. ? Are comfortable and fit you well. ? Are closed at the toe. Do not wear sandals.  If you use a stepladder: ? Make sure that it is fully opened. Do not climb a closed stepladder. ? Make sure that both sides of the stepladder are locked into place. ? Ask someone to hold it for you, if possible.  Clearly mark and make sure that you can see: ? Any grab bars or handrails. ? First and last steps. ? Where the edge of each step is.  Use tools that help you move around (mobility aids) if they are needed. These include: ? Canes. ? Walkers. ? Scooters. ? Crutches.  Turn on the lights when you go into a dark area. Replace any light bulbs as soon as they burn out.  Set up your furniture so you have a clear path.  Avoid moving your furniture around.  If any of your floors are uneven, fix them.  If there are any pets around you, be aware of where they are.  Review your medicines with your doctor. Some medicines can make you feel dizzy. This can increase your chance of falling. Ask your doctor what other things that you can do to help prevent falls. This information is not intended to replace advice given to you by your health care provider. Make sure you discuss any questions you have with your health care provider. Document Released: 12/08/2008 Document Revised: 07/20/2015 Document Reviewed: 03/18/2014 Elsevier Interactive Patient Education  2018 [place].   Influenza Virus Vaccine injection (Fluarix) What is this medicine? INFLUENZA VIRUS VACCINE (in floo EN zuh VAHY ruhs vak SEEN) helps to reduce the risk of getting influenza also known as the flu. This medicine may be used for other purposes; ask your health care provider or pharmacist if you have questions. COMMON BRAND NAME(S): Fluarix, Fluzone What should I tell my health care provider before I take this medicine? They need to know if you have any of these conditions: -bleeding disorder like hemophilia -fever or infection -Guillain-Barre syndrome or other neurological problems -immune system problems -infection with the human immunodeficiency virus (HIV) or AIDS -low blood platelet counts -multiple sclerosis -an unusual or allergic reaction to influenza virus vaccine, eggs, chicken proteins, latex, gentamicin, other medicines, foods, dyes or preservatives -pregnant or trying to get pregnant -breast-feeding How should I use this medicine? This vaccine is for injection into a muscle. It is given by a health care professional. A copy of Vaccine Information Statements will be given before each vaccination. Read this sheet carefully each time. The sheet may change frequently. Talk to your pediatrician regarding the use of this medicine  in children. Special care may be needed. Overdosage: If you think you have taken too much of this medicine contact a poison control center or emergency room at once. NOTE: This medicine is only for you. Do not share this medicine with others. What if I miss a dose? This does not apply. What may interact with this medicine? -chemotherapy or radiation therapy -medicines that lower your immune system like etanercept, anakinra, infliximab, and adalimumab -medicines that treat or prevent blood clots like warfarin -phenytoin -steroid medicines like prednisone or cortisone -theophylline -vaccines This list may not describe all possible interactions. Give your health care provider a list of all the medicines, herbs,  non-prescription drugs, or dietary supplements you use. Also tell them if you smoke, drink alcohol, or use illegal drugs. Some items may interact with your medicine. What should I watch for while using this medicine? Report any side effects that do not go away within 3 days to your doctor or health care professional. Call your health care provider if any unusual symptoms occur within 6 weeks of receiving this vaccine. You may still catch the flu, but the illness is not usually as bad. You cannot get the flu from the vaccine. The vaccine will not protect against colds or other illnesses that may cause fever. The vaccine is needed every year. What side effects may I notice from receiving this medicine? Side effects that you should report to your doctor or health care professional as soon as possible: -allergic reactions like skin rash, itching or hives, swelling of the face, lips, or tongue Side effects that usually do not require medical attention (report to your doctor or health care professional if they continue or are bothersome): -fever -headache -muscle aches and pains -pain, tenderness, redness, or swelling at site where injected -weak or tired This list may not describe all  possible side effects. Call your doctor for medical advice about side effects. You may report side effects to FDA at 1-800-FDA-1088. Where should I keep my medicine? This vaccine is only given in a clinic, pharmacy, doctor's office, or other health care setting and will not be stored at home. NOTE: This sheet is a summary. It may not cover all possible information. If you have questions about this medicine, talk to your doctor, pharmacist, or health care provider.  2018 Elsevier/Gold Standard (2007-09-09 09:30:40)

## 2017-11-10 NOTE — Telephone Encounter (Signed)
Attempted to call pt. I did not receive an answer. I have left a message for pt to return our call.  

## 2017-11-10 NOTE — Telephone Encounter (Signed)
Patient returned call - he can be reached at [phone]

## 2017-11-10 NOTE — Telephone Encounter (Signed)
Okay to give him mask if we have one.

## 2017-11-12 NOTE — Telephone Encounter (Signed)
Patient returned call.  He was advised mask is here for him to pick up.  He states he will be by this afternoon to get it.  No call back is needed.

## 2017-11-12 NOTE — Telephone Encounter (Signed)
Per VS and Kelli- give medium mask and he can call for larger if needed  I have placed a med ffm up front for pick up  LMTCB

## 2017-11-13 NOTE — Telephone Encounter (Signed)
Noted by triage, will sign off

## 2017-11-28 MED FILL — HYDROCHLOROTHIAZIDE 25 MG T: 25 | 30 days supply | Qty: 30 | Fill #2

## 2017-12-01 MED FILL — ACYCLOVIR 400 MG TABLET: 400 | 30 days supply | Qty: 60 | Fill #3

## 2017-12-01 MED FILL — VIT D2 1.25 MG (50,000 UNIT: 1.25 MG | 28 days supply | Qty: 4 | Fill #3

## 2017-12-09 MED FILL — TAMSULOSIN HCL 0.4 MG CAP: 0.4 | 30 days supply | Qty: 30 | Fill #1

## 2017-12-12 MED FILL — IBUPROFEN 800 MG TABLET: 800 | 8 days supply | Qty: 30 | Fill #0

## 2017-12-29 ENCOUNTER — Encounter: Payer: Self-pay | Admitting: Internal Medicine

## 2017-12-30 ENCOUNTER — Ambulatory Visit: Payer: Self-pay | Attending: Internal Medicine | Admitting: Internal Medicine

## 2017-12-30 ENCOUNTER — Other Ambulatory Visit: Payer: Self-pay | Admitting: Internal Medicine

## 2017-12-30 ENCOUNTER — Encounter: Payer: Self-pay | Admitting: Internal Medicine

## 2017-12-30 VITALS — BP 129/85 | HR 63 | Temp 98.1°F | Resp 16 | Wt 218.2 lb

## 2017-12-30 DIAGNOSIS — I10 Essential (primary) hypertension: Secondary | ICD-10-CM

## 2017-12-30 DIAGNOSIS — E785 Hyperlipidemia, unspecified: Secondary | ICD-10-CM | POA: Insufficient documentation

## 2017-12-30 DIAGNOSIS — G5603 Carpal tunnel syndrome, bilateral upper limbs: Secondary | ICD-10-CM | POA: Insufficient documentation

## 2017-12-30 DIAGNOSIS — N4889 Other specified disorders of penis: Secondary | ICD-10-CM | POA: Insufficient documentation

## 2017-12-30 DIAGNOSIS — Z8249 Family history of ischemic heart disease and other diseases of the circulatory system: Secondary | ICD-10-CM | POA: Insufficient documentation

## 2017-12-30 DIAGNOSIS — Z1331 Encounter for screening for depression: Secondary | ICD-10-CM

## 2017-12-30 DIAGNOSIS — J302 Other seasonal allergic rhinitis: Secondary | ICD-10-CM | POA: Insufficient documentation

## 2017-12-30 DIAGNOSIS — G4733 Obstructive sleep apnea (adult) (pediatric): Secondary | ICD-10-CM | POA: Insufficient documentation

## 2017-12-30 DIAGNOSIS — N529 Male erectile dysfunction, unspecified: Secondary | ICD-10-CM | POA: Insufficient documentation

## 2017-12-30 DIAGNOSIS — F22 Delusional disorders: Secondary | ICD-10-CM | POA: Insufficient documentation

## 2017-12-30 DIAGNOSIS — M545 Low back pain: Secondary | ICD-10-CM | POA: Insufficient documentation

## 2017-12-30 DIAGNOSIS — W010XXA Fall on same level from slipping, tripping and stumbling without subsequent striking against object, initial encounter: Secondary | ICD-10-CM | POA: Insufficient documentation

## 2017-12-30 DIAGNOSIS — G8929 Other chronic pain: Secondary | ICD-10-CM

## 2017-12-30 DIAGNOSIS — N400 Enlarged prostate without lower urinary tract symptoms: Secondary | ICD-10-CM | POA: Insufficient documentation

## 2017-12-30 DIAGNOSIS — K219 Gastro-esophageal reflux disease without esophagitis: Secondary | ICD-10-CM | POA: Insufficient documentation

## 2017-12-30 DIAGNOSIS — E559 Vitamin D deficiency, unspecified: Secondary | ICD-10-CM

## 2017-12-30 DIAGNOSIS — F419 Anxiety disorder, unspecified: Secondary | ICD-10-CM | POA: Insufficient documentation

## 2017-12-30 DIAGNOSIS — F17210 Nicotine dependence, cigarettes, uncomplicated: Secondary | ICD-10-CM | POA: Insufficient documentation

## 2017-12-30 DIAGNOSIS — M25512 Pain in left shoulder: Secondary | ICD-10-CM | POA: Insufficient documentation

## 2017-12-30 DIAGNOSIS — M1711 Unilateral primary osteoarthritis, right knee: Secondary | ICD-10-CM | POA: Insufficient documentation

## 2017-12-30 DIAGNOSIS — Z79899 Other long term (current) drug therapy: Secondary | ICD-10-CM | POA: Insufficient documentation

## 2017-12-30 DIAGNOSIS — K5900 Constipation, unspecified: Secondary | ICD-10-CM | POA: Insufficient documentation

## 2017-12-30 DIAGNOSIS — R239 Unspecified skin changes: Secondary | ICD-10-CM

## 2017-12-30 DIAGNOSIS — Z7982 Long term (current) use of aspirin: Secondary | ICD-10-CM | POA: Insufficient documentation

## 2017-12-30 DIAGNOSIS — Z6833 Body mass index (BMI) 33.0-33.9, adult: Secondary | ICD-10-CM | POA: Insufficient documentation

## 2017-12-30 MED ORDER — VITAMIN D (ERGOCALCIFEROL) 1.25 MG (50000 UNIT) PO CAPS: 50000.0000 [IU] | ORAL_CAPSULE | ORAL | 0 refills | Status: DC

## 2017-12-30 MED ORDER — FLUTICASONE PROPIONATE 50 MCG/ACT NA SUSP: 2.0000 | Freq: Every day | NASAL | 6 refills | Status: DC

## 2017-12-30 MED ORDER — DULOXETINE HCL 20 MG PO CPEP
20.0000 mg | ORAL_CAPSULE | Freq: Every day | ORAL | 3 refills | Status: DC
Start: 2017-12-30 — End: 2018-02-12

## 2017-12-30 MED FILL — HYDROCHLOROTHIAZIDE 25 MG T: 25 | 30 days supply | Qty: 30 | Fill #0
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
## 2017-12-30 NOTE — Patient Instructions (Addendum)
Please keep an eye on the skin change on the head of the penis.  Follow up if it increases in size.   Start the Cymbalta to help with depressed mood and anxiety.  It will also help with chronic pain in the shoulder.    I have referred you to orthopedics.

## 2017-12-31 MED FILL — FLUTICASONE PROP 50 MCG SPR: 50 | 30 days supply | Qty: 16 | Fill #0

## 2017-12-31 MED FILL — VIT D2 1.25 MG (50,000 UNIT: 1.25 MG | 28 days supply | Qty: 4 | Fill #0

## 2017-12-31 MED FILL — DULoxetine HCL 20 MG CPEP: 20 | 30 days supply | Qty: 30 | Fill #0

## 2018-01-06 MED FILL — ACYCLOVIR 400 MG TABLET: 400 | 30 days supply | Qty: 60 | Fill #4

## 2018-01-15 MED FILL — TAMSULOSIN HCL 0.4 MG CAP: 0.4 | 30 days supply | Qty: 30 | Fill #2

## 2018-01-16 ENCOUNTER — Encounter (HOSPITAL_COMMUNITY): Payer: Self-pay | Admitting: Internal Medicine

## 2018-01-16 ENCOUNTER — Ambulatory Visit (HOSPITAL_COMMUNITY)
Admission: EM | Admit: 2018-01-16 | Discharge: 2018-01-16 | Disposition: A | Discharge status: Home / Self Care | Payer: Self-pay

## 2018-01-16 ENCOUNTER — Emergency Department (HOSPITAL_COMMUNITY): Payer: Self-pay

## 2018-01-16 ENCOUNTER — Emergency Department (HOSPITAL_COMMUNITY)
Admission: EM | Admit: 2018-01-16 | Discharge: 2018-01-16 | Disposition: A | Discharge status: Home / Self Care | Payer: Self-pay | Attending: Emergency Medicine | Admitting: Emergency Medicine

## 2018-01-16 DIAGNOSIS — R0789 Other chest pain: Secondary | ICD-10-CM | POA: Insufficient documentation

## 2018-01-16 DIAGNOSIS — R079 Chest pain, unspecified: Secondary | ICD-10-CM

## 2018-01-16 DIAGNOSIS — Z7982 Long term (current) use of aspirin: Secondary | ICD-10-CM | POA: Insufficient documentation

## 2018-01-16 DIAGNOSIS — F17210 Nicotine dependence, cigarettes, uncomplicated: Secondary | ICD-10-CM | POA: Insufficient documentation

## 2018-01-16 DIAGNOSIS — Z79899 Other long term (current) drug therapy: Secondary | ICD-10-CM | POA: Insufficient documentation

## 2018-01-16 NOTE — Discharge Instructions (Addendum)
Take Tylenol as directed for pain.  You can take Tylenol every 4 hours.  Contact your primary care physician if you continue to have significant pain in a week.  Ask your primary care physician to help you to stop smoking

## 2018-01-16 NOTE — ED Notes (Signed)
All vitals documented at this time were errors in charting
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
## 2018-01-16 NOTE — ED Triage Notes (Signed)
Pt c/o chest pain off and on since yesterday, pt states hes been under a lot of stress, ekg obtained and given to Dr. Joseph Art, pt Dr. Carlean Jews, pt needs evaluation in ER to rule out cardiac event. Pt agreeable to plan, left for the ER.

## 2018-01-16 NOTE — ED Triage Notes (Addendum)
Error in charting.

## 2018-01-16 NOTE — ED Notes (Signed)
Patient verbalizes understanding of discharge instructions. Opportunity for questioning and answers were provided. Armband removed by staff, pt discharged from ED.  
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
## 2018-01-16 NOTE — ED Notes (Signed)
Patient transported to X-ray 

## 2018-01-30 MED FILL — HYDROCHLOROTHIAZIDE 25 MG T: 25 | 30 days supply | Qty: 30 | Fill #1

## 2018-02-11 MED FILL — ACYCLOVIR 400 MG TABLET: 400 | 30 days supply | Qty: 60 | Fill #5

## 2018-02-11 MED FILL — VIT D2 1.25 MG (50,000 UNIT: 1.25 MG | 84 days supply | Qty: 12 | Fill #1

## 2018-02-12 ENCOUNTER — Ambulatory Visit: Payer: Self-pay | Attending: Internal Medicine | Admitting: Internal Medicine

## 2018-02-12 ENCOUNTER — Encounter: Payer: Self-pay | Admitting: Internal Medicine

## 2018-02-12 VITALS — BP 118/78 | HR 71 | Temp 98.2°F | Resp 16 | Wt 224.4 lb

## 2018-02-12 DIAGNOSIS — M19049 Primary osteoarthritis, unspecified hand: Secondary | ICD-10-CM

## 2018-02-12 DIAGNOSIS — G4489 Other headache syndrome: Secondary | ICD-10-CM

## 2018-02-12 DIAGNOSIS — I10 Essential (primary) hypertension: Secondary | ICD-10-CM | POA: Insufficient documentation

## 2018-02-12 DIAGNOSIS — E559 Vitamin D deficiency, unspecified: Secondary | ICD-10-CM | POA: Insufficient documentation

## 2018-02-12 DIAGNOSIS — Z79899 Other long term (current) drug therapy: Secondary | ICD-10-CM | POA: Insufficient documentation

## 2018-02-12 DIAGNOSIS — Z8249 Family history of ischemic heart disease and other diseases of the circulatory system: Secondary | ICD-10-CM | POA: Insufficient documentation

## 2018-02-12 DIAGNOSIS — Z791 Long term (current) use of non-steroidal anti-inflammatories (NSAID): Secondary | ICD-10-CM | POA: Insufficient documentation

## 2018-02-12 DIAGNOSIS — F419 Anxiety disorder, unspecified: Secondary | ICD-10-CM

## 2018-02-12 DIAGNOSIS — Z7982 Long term (current) use of aspirin: Secondary | ICD-10-CM | POA: Insufficient documentation

## 2018-02-12 DIAGNOSIS — Z6839 Body mass index (BMI) 39.0-39.9, adult: Secondary | ICD-10-CM | POA: Insufficient documentation

## 2018-02-12 DIAGNOSIS — K219 Gastro-esophageal reflux disease without esophagitis: Secondary | ICD-10-CM | POA: Insufficient documentation

## 2018-02-12 DIAGNOSIS — G5603 Carpal tunnel syndrome, bilateral upper limbs: Secondary | ICD-10-CM | POA: Insufficient documentation

## 2018-02-12 DIAGNOSIS — G8929 Other chronic pain: Secondary | ICD-10-CM | POA: Insufficient documentation

## 2018-02-12 DIAGNOSIS — G4733 Obstructive sleep apnea (adult) (pediatric): Secondary | ICD-10-CM | POA: Insufficient documentation

## 2018-02-12 DIAGNOSIS — M542 Cervicalgia: Secondary | ICD-10-CM

## 2018-02-12 DIAGNOSIS — F329 Major depressive disorder, single episode, unspecified: Secondary | ICD-10-CM | POA: Insufficient documentation

## 2018-02-12 DIAGNOSIS — Z1331 Encounter for screening for depression: Secondary | ICD-10-CM

## 2018-02-12 DIAGNOSIS — N400 Enlarged prostate without lower urinary tract symptoms: Secondary | ICD-10-CM | POA: Insufficient documentation

## 2018-02-12 DIAGNOSIS — M1711 Unilateral primary osteoarthritis, right knee: Secondary | ICD-10-CM | POA: Insufficient documentation

## 2018-02-12 DIAGNOSIS — F17210 Nicotine dependence, cigarettes, uncomplicated: Secondary | ICD-10-CM | POA: Insufficient documentation

## 2018-02-12 DIAGNOSIS — E785 Hyperlipidemia, unspecified: Secondary | ICD-10-CM | POA: Insufficient documentation

## 2018-02-12 DIAGNOSIS — M545 Low back pain: Secondary | ICD-10-CM | POA: Insufficient documentation

## 2018-02-12 DIAGNOSIS — Z7951 Long term (current) use of inhaled steroids: Secondary | ICD-10-CM | POA: Insufficient documentation

## 2018-02-12 DIAGNOSIS — N529 Male erectile dysfunction, unspecified: Secondary | ICD-10-CM | POA: Insufficient documentation

## 2018-02-12 DIAGNOSIS — G43909 Migraine, unspecified, not intractable, without status migrainosus: Secondary | ICD-10-CM | POA: Insufficient documentation

## 2018-02-12 MED ORDER — DICLOFENAC SODIUM 1 % TD GEL: 2.0000 g | Freq: Two times a day (BID) | TRANSDERMAL | 4 refills | Status: DC | PRN

## 2018-02-12 MED ORDER — AMITRIPTYLINE HCL 10 MG PO TABS: 10.0000 mg | ORAL_TABLET | Freq: Every day | ORAL | 1 refills | Status: DC

## 2018-02-12 MED FILL — AMITRIPTYLINE HCL 10 MG TAB: 10 | 30 days supply | Qty: 30 | Fill #0

## 2018-02-12 MED FILL — DICLOFENAC SODIUM 1% GEL: 1 | 25 days supply | Qty: 100 | Fill #0

## 2018-02-12 NOTE — Patient Instructions (Signed)
You can continue to use the Tylenol as needed for headache.  I have given a medication called amitriptyline to take at bedtime to help decrease the frequency of the headaches.  Use the Voltaren gel as needed for the hand pain and neck pain.
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
## 2018-02-20 ENCOUNTER — Other Ambulatory Visit: Payer: Self-pay | Admitting: Internal Medicine

## 2018-02-20 DIAGNOSIS — N400 Enlarged prostate without lower urinary tract symptoms: Secondary | ICD-10-CM

## 2018-02-20 MED FILL — TAMSULOSIN HCL 0.4 MG CAP: 0.4 | 30 days supply | Qty: 30 | Fill #0

## 2018-03-03 MED FILL — HYDROCHLOROTHIAZIDE 25 MG T: 25 | 30 days supply | Qty: 30 | Fill #2

## 2018-03-16 ENCOUNTER — Encounter: Payer: Self-pay | Admitting: Internal Medicine

## 2018-03-16 MED ORDER — ACYCLOVIR 400 MG PO TABS: 400.0000 mg | ORAL_TABLET | Freq: Two times a day (BID) | ORAL | 5 refills | Status: DC

## 2018-03-16 MED FILL — ACYCLOVIR 400 MG TABLET: 400 | 30 days supply | Qty: 60 | Fill #0

## 2018-03-17 ENCOUNTER — Encounter: Payer: Self-pay | Admitting: Internal Medicine

## 2018-03-17 ENCOUNTER — Telehealth: Payer: Self-pay | Admitting: Internal Medicine

## 2018-03-17 NOTE — Telephone Encounter (Signed)
Pt came in to drop off a [employer] Documentation of psychiatric Disorders/ADD/ADHD verification form, please follow up when its ready for pick up and fax.  NZU:[phone]

## 2018-03-22 ENCOUNTER — Encounter: Payer: Self-pay | Admitting: Internal Medicine

## 2018-03-23 NOTE — Telephone Encounter (Signed)
Contacted pt and made aware that paperwork is ready for pick up. Pt states he will be by today after 230pm to pick it up

## 2018-03-23 NOTE — Telephone Encounter (Signed)
Mychart request

## 2018-03-23 NOTE — Telephone Encounter (Signed)
Mychart message

## 2018-03-25 ENCOUNTER — Encounter: Payer: Self-pay | Admitting: Internal Medicine

## 2018-03-31 MED FILL — TAMSULOSIN HCL 0.4 MG CAP: 0.4 | 30 days supply | Qty: 30 | Fill #1

## 2018-04-02 ENCOUNTER — Ambulatory Visit: Payer: Self-pay | Attending: Family Medicine | Admitting: Physician Assistant

## 2018-04-02 ENCOUNTER — Ambulatory Visit (HOSPITAL_COMMUNITY)
Admission: RE | Admit: 2018-04-02 | Discharge: 2018-04-02 | Disposition: A | Discharge status: Home / Self Care | Payer: Self-pay | Source: Ambulatory Visit | Attending: Physician Assistant | Admitting: Physician Assistant

## 2018-04-02 VITALS — BP 117/76 | HR 75 | Temp 97.9°F | Wt 224.6 lb

## 2018-04-02 DIAGNOSIS — R202 Paresthesia of skin: Secondary | ICD-10-CM

## 2018-04-02 DIAGNOSIS — E559 Vitamin D deficiency, unspecified: Secondary | ICD-10-CM

## 2018-04-02 DIAGNOSIS — S9031XA Contusion of right foot, initial encounter: Secondary | ICD-10-CM

## 2018-04-02 DIAGNOSIS — Z131 Encounter for screening for diabetes mellitus: Secondary | ICD-10-CM

## 2018-04-02 DIAGNOSIS — I10 Essential (primary) hypertension: Secondary | ICD-10-CM

## 2018-04-02 DIAGNOSIS — W2209XA Striking against other stationary object, initial encounter: Secondary | ICD-10-CM

## 2018-04-02 MED ORDER — GABAPENTIN 300 MG PO CAPS: 300.0000 mg | ORAL_CAPSULE | Freq: Two times a day (BID) | ORAL | 3 refills | Status: DC

## 2018-04-02 MED FILL — GABAPENTIN 300 MG CAPSULE: 300 | 30 days supply | Qty: 60 | Fill #0
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
## 2018-04-03 ENCOUNTER — Telehealth: Payer: Self-pay

## 2018-04-03 LAB — BASIC METABOLIC PANEL
BUN/Creatinine Ratio: 11 (ref 9–20)
BUN: 11 mg/dL (ref 6–24)
CO2: 26 mmol/L (ref 20–29)
Calcium: 10.5 mg/dL — ABNORMAL HIGH (ref 8.7–10.2)
Chloride: 98 mmol/L (ref 96–106)
Creatinine, Ser: 1.04 mg/dL (ref 0.76–1.27)
GFR calc Af Amer: 92 mL/min/{1.73_m2} (ref >59)
GFR calc non Af Amer: 80 mL/min/{1.73_m2} (ref >59)
Glucose: 93 mg/dL (ref 65–99)
POTASSIUM: 4.1 mmol/L (ref 3.5–5.2)
Sodium: 141 mmol/L (ref 134–144)

## 2018-04-03 LAB — HEMOGLOBIN A1C
Est. average glucose Bld gHb Est-mCnc: 111 mg/dL
Hgb A1c MFr Bld: 5.5 % (ref 4.8–5.6)

## 2018-04-03 LAB — VITAMIN D 25 HYDROXY (VIT D DEFICIENCY, FRACTURES): Vit D, 25-Hydroxy: 41.3 ng/mL (ref 30.0–100.0)

## 2018-04-03 LAB — TSH: TSH: 1.45 u[IU]/mL (ref 0.450–4.500)

## 2018-04-03 NOTE — Telephone Encounter (Signed)
-----   Message from Argentina Donovan, [place] sent at 04/03/2018  9:10 AM EST ----- Your labs are all normal. You do not have diabetes.  I don't see anything in the lab work that would explain the burning in your feet.  Try the medication I sent for you.  Your xrays show arthritis but no broken bones.  Follow-up as planned.  Thanks, [name], PA-C

## 2018-04-03 NOTE — Telephone Encounter (Signed)
Patient was called to go over lab results patient states that he saw results on mychart.

## 2018-04-05 ENCOUNTER — Encounter: Payer: Self-pay | Admitting: Internal Medicine

## 2018-04-05 DIAGNOSIS — I10 Essential (primary) hypertension: Secondary | ICD-10-CM

## 2018-04-06 MED ORDER — HYDROCHLOROTHIAZIDE 25 MG PO TABS: 25.0000 mg | ORAL_TABLET | Freq: Every day | ORAL | 2 refills | Status: DC

## 2018-04-06 MED FILL — HYDROCHLOROTHIAZIDE 25 MG T: 25 | 30 days supply | Qty: 30 | Fill #0

## 2018-04-06 NOTE — Telephone Encounter (Signed)
BP refill request

## 2018-04-16 MED FILL — ACYCLOVIR 400 MG TABLET: 400 | 30 days supply | Qty: 60 | Fill #1 | Status: TO

## 2018-04-24 ENCOUNTER — Ambulatory Visit: Payer: Self-pay

## 2018-04-26 ENCOUNTER — Ambulatory Visit (HOSPITAL_COMMUNITY)
Admission: EM | Admit: 2018-04-26 | Discharge: 2018-04-26 | Disposition: A | Discharge status: Home / Self Care | Payer: Self-pay | Attending: Emergency Medicine | Admitting: Emergency Medicine

## 2018-04-26 ENCOUNTER — Encounter (HOSPITAL_COMMUNITY): Payer: Self-pay | Admitting: *Deleted

## 2018-04-26 DIAGNOSIS — M1711 Unilateral primary osteoarthritis, right knee: Secondary | ICD-10-CM | POA: Insufficient documentation

## 2018-04-26 DIAGNOSIS — Z801 Family history of malignant neoplasm of trachea, bronchus and lung: Secondary | ICD-10-CM | POA: Insufficient documentation

## 2018-04-26 DIAGNOSIS — Z8249 Family history of ischemic heart disease and other diseases of the circulatory system: Secondary | ICD-10-CM | POA: Insufficient documentation

## 2018-04-26 DIAGNOSIS — M19049 Primary osteoarthritis, unspecified hand: Secondary | ICD-10-CM | POA: Insufficient documentation

## 2018-04-26 DIAGNOSIS — Z7982 Long term (current) use of aspirin: Secondary | ICD-10-CM | POA: Insufficient documentation

## 2018-04-26 DIAGNOSIS — T148XXA Other injury of unspecified body region, initial encounter: Secondary | ICD-10-CM | POA: Insufficient documentation

## 2018-04-26 DIAGNOSIS — Z792 Long term (current) use of antibiotics: Secondary | ICD-10-CM | POA: Insufficient documentation

## 2018-04-26 DIAGNOSIS — N529 Male erectile dysfunction, unspecified: Secondary | ICD-10-CM | POA: Insufficient documentation

## 2018-04-26 DIAGNOSIS — F17210 Nicotine dependence, cigarettes, uncomplicated: Secondary | ICD-10-CM | POA: Insufficient documentation

## 2018-04-26 DIAGNOSIS — W503XXA Accidental bite by another person, initial encounter: Secondary | ICD-10-CM | POA: Insufficient documentation

## 2018-04-26 DIAGNOSIS — Z113 Encounter for screening for infections with a predominantly sexual mode of transmission: Secondary | ICD-10-CM

## 2018-04-26 DIAGNOSIS — I10 Essential (primary) hypertension: Secondary | ICD-10-CM | POA: Insufficient documentation

## 2018-04-26 DIAGNOSIS — Z79899 Other long term (current) drug therapy: Secondary | ICD-10-CM | POA: Insufficient documentation

## 2018-04-26 DIAGNOSIS — S61442A Puncture wound with foreign body of left hand, initial encounter: Secondary | ICD-10-CM

## 2018-04-26 DIAGNOSIS — K219 Gastro-esophageal reflux disease without esophagitis: Secondary | ICD-10-CM | POA: Insufficient documentation

## 2018-04-26 DIAGNOSIS — G4733 Obstructive sleep apnea (adult) (pediatric): Secondary | ICD-10-CM | POA: Insufficient documentation

## 2018-04-26 LAB — COMPREHENSIVE METABOLIC PANEL
ALT: 21 U/L (ref 0–44)
AST: 20 U/L (ref 15–41)
Albumin: 4 g/dL (ref 3.5–5.0)
Alkaline Phosphatase: 63 U/L (ref 38–126)
Anion gap: 8 (ref 5–15)
BUN: <5 mg/dL — ABNORMAL LOW (ref 6–20)
CO2: 25 mmol/L (ref 22–32)
Calcium: 9.5 mg/dL (ref 8.9–10.3)
Chloride: 106 mmol/L (ref 98–111)
Creatinine, Ser: 1.03 mg/dL (ref 0.61–1.24)
GFR calc Af Amer: >60 mL/min (ref >60)
GFR calc non Af Amer: >60 mL/min (ref >60)
Glucose, Bld: 105 mg/dL — ABNORMAL HIGH (ref 70–99)
Potassium: 3.7 mmol/L (ref 3.5–5.1)
Sodium: 139 mmol/L (ref 135–145)
Total Bilirubin: 0.5 mg/dL (ref 0.3–1.2)
Total Protein: 7.3 g/dL (ref 6.5–8.1)

## 2018-04-26 LAB — RAPID HIV SCREEN (HIV 1/2 AB+AG)
HIV 1/2 ANTIBODIES: NONREACTIVE
HIV-1 P24 Antigen - HIV24: NONREACTIVE

## 2018-04-26 MED ORDER — AMOXICILLIN-POT CLAVULANATE 875-125 MG PO TABS: 1.0000 | ORAL_TABLET | Freq: Two times a day (BID) | ORAL | 0 refills | Status: DC

## 2018-04-26 MED ORDER — AMOXICILLIN-POT CLAVULANATE 875-125 MG PO TABS: 1.0000 | ORAL_TABLET | Freq: Once | ORAL | Status: DC
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
## 2018-04-27 LAB — HEPATITIS B SURFACE ANTIGEN: Hepatitis B Surface Ag: NEGATIVE

## 2018-04-27 LAB — HEPATITIS C ANTIBODY: HCV Ab: <0.1 s/co ratio (ref 0.0–0.9)

## 2018-04-27 MED FILL — AMOX-CLAV 875-125 MG TABLET: 875-125 | 7 days supply | Qty: 14 | Fill #0

## 2018-04-28 LAB — RPR: RPR Ser Ql: NONREACTIVE

## 2018-04-29 DIAGNOSIS — G4733 Obstructive sleep apnea (adult) (pediatric): Secondary | ICD-10-CM

## 2018-04-29 NOTE — Telephone Encounter (Signed)
Patient emailed to let us know the mask provided in office did not work for him at this time. Pt is requesting the Simplus full mask Placed order today to [facility] to order this mask Will f/u with patient next week to make sure he rec'd the mask.

## 2018-05-04 MED FILL — HYDROCHLOROTHIAZIDE 25 MG T: 25 | 30 days supply | Qty: 30 | Fill #1

## 2018-05-04 MED FILL — TAMSULOSIN HCL 0.4 MG CAP: 0.4 | 30 days supply | Qty: 30 | Fill #2

## 2018-05-13 ENCOUNTER — Encounter (HOSPITAL_COMMUNITY): Payer: Self-pay | Admitting: Emergency Medicine

## 2018-05-13 ENCOUNTER — Ambulatory Visit (HOSPITAL_COMMUNITY)
Admission: EM | Admit: 2018-05-13 | Discharge: 2018-05-13 | Disposition: A | Discharge status: Home / Self Care | Payer: Self-pay | Attending: Family Medicine | Admitting: Family Medicine

## 2018-05-13 ENCOUNTER — Other Ambulatory Visit: Payer: Self-pay

## 2018-05-13 DIAGNOSIS — J302 Other seasonal allergic rhinitis: Secondary | ICD-10-CM

## 2018-05-13 MED ORDER — FLUTICASONE PROPIONATE 50 MCG/ACT NA SUSP: 1.0000 | Freq: Every day | NASAL | 2 refills | Status: DC

## 2018-05-13 MED ORDER — MECLIZINE HCL 25 MG PO TABS: 25.0000 mg | ORAL_TABLET | Freq: Three times a day (TID) | ORAL | 0 refills | Status: DC | PRN

## 2018-05-13 MED ORDER — CETIRIZINE HCL 10 MG PO TABS: 10.0000 mg | ORAL_TABLET | Freq: Every day | ORAL | 3 refills | Status: DC

## 2018-05-13 MED FILL — FLUTICASONE PROP 50 MCG SPR: 50 | 30 days supply | Qty: 16 | Fill #0

## 2018-05-13 MED FILL — MECLIZINE 25 MG TABLET: 25 | 10 days supply | Qty: 30 | Fill #0

## 2018-05-13 NOTE — ED Triage Notes (Signed)
On 05/06/2018 started not feeling well.  Patient has a scratchy throat.  Patient did do yard work.  Patient reports cough  Patient is not forth coming with details of how he feels.  Has felt bad for a week and tried to do yard work yesterday, started feeling bad again.    Patient is repeatedly commenting on concerns of having corona virus.
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
## 2018-05-13 NOTE — Discharge Instructions (Signed)
I believe your symptoms related to allergies We will treat this with Flonase and Zyrtec daily Follow up as needed for continued or worsening symptoms

## 2018-05-18 ENCOUNTER — Other Ambulatory Visit: Payer: Self-pay | Admitting: Internal Medicine

## 2018-05-18 ENCOUNTER — Encounter: Payer: Self-pay | Admitting: Internal Medicine

## 2018-05-18 DIAGNOSIS — G4733 Obstructive sleep apnea (adult) (pediatric): Secondary | ICD-10-CM

## 2018-05-18 DIAGNOSIS — N400 Enlarged prostate without lower urinary tract symptoms: Secondary | ICD-10-CM

## 2018-05-19 ENCOUNTER — Other Ambulatory Visit: Payer: Self-pay

## 2018-05-19 ENCOUNTER — Encounter: Payer: Self-pay | Admitting: Internal Medicine

## 2018-05-19 MED ORDER — ACYCLOVIR 400 MG PO TABS: 400.0000 mg | ORAL_TABLET | Freq: Two times a day (BID) | ORAL | 5 refills | Status: DC

## 2018-05-19 MED FILL — HYDROCHLOROTHIAZIDE 25 MG T: 25 | 30 days supply | Qty: 30 | Fill #2

## 2018-05-19 MED FILL — TAMSULOSIN HCL 0.4 MG CAP: 0.4 | 30 days supply | Qty: 30 | Fill #0

## 2018-05-19 NOTE — Telephone Encounter (Signed)
Patient emailed stating that he has not yet rec'd cpap Simplus full face mask from DME yet Reviewed account, the order was sent on 04/29/2018 to [facility]  At this time pt has not rec'd the mask Contacted [name] had to LVM on general VM regarding matter Placed order for urgent request for this mask to be sent to patient this week Placed on order an update status on this mask as well Awaiting response back from [facility] today

## 2018-05-22 ENCOUNTER — Other Ambulatory Visit: Payer: Self-pay | Admitting: Pulmonary Disease

## 2018-05-22 DIAGNOSIS — G4733 Obstructive sleep apnea (adult) (pediatric): Secondary | ICD-10-CM

## 2018-06-11 ENCOUNTER — Telehealth: Payer: Self-pay | Admitting: Pulmonary Disease

## 2018-06-11 NOTE — Telephone Encounter (Signed)
Pt here today stating Dr.Sood gave him a cpap mask. He would like nurse to call him@ [phone].

## 2018-06-12 NOTE — Telephone Encounter (Signed)
LMTCB- is there an issue with the mask..? What is needed here

## 2018-06-15 ENCOUNTER — Encounter: Payer: Self-pay | Admitting: Internal Medicine

## 2018-06-15 ENCOUNTER — Other Ambulatory Visit: Payer: Self-pay | Admitting: Family Medicine

## 2018-06-15 MED ORDER — ACYCLOVIR 400 MG PO TABS: 400.0000 mg | ORAL_TABLET | Freq: Two times a day (BID) | ORAL | 2 refills | Status: DC

## 2018-06-15 MED FILL — ACYCLOVIR 400 MG TABLET: 400 | 30 days supply | Qty: 60 | Fill #0

## 2018-06-15 NOTE — Telephone Encounter (Signed)
He got that mask at least 4-5 months ago. He says the mask doesn't fit. He wants Dr. Halford Chessman to give him another mask. I explained that one was given as a Manufacturing engineer. We usually send orders.Marland Kitchen a order was sent to DME but when he was contacted he declines b/c he didn't want to pay.

## 2018-06-17 ENCOUNTER — Telehealth: Payer: Self-pay | Admitting: Pulmonary Disease

## 2018-06-17 NOTE — Telephone Encounter (Signed)
Left message for patient to call back. We do not supply patients with masks. He will need to speak with his DME company.

## 2018-06-17 NOTE — Telephone Encounter (Signed)
Will close this message since there is already opened on the same matter.

## 2018-06-22 NOTE — Telephone Encounter (Signed)
A referral was placed in March for patient: [name] did receive this order pt refused to get it because they will have to bill ins and pt may have something to pay said dr always gives him the mask Samuel Irwin

## 2018-06-24 ENCOUNTER — Telehealth: Payer: Self-pay | Admitting: Pulmonary Disease

## 2018-06-24 DIAGNOSIS — G4733 Obstructive sleep apnea (adult) (pediatric): Secondary | ICD-10-CM

## 2018-06-24 NOTE — Telephone Encounter (Signed)
A referral was placed in March and submitted for a new cpap mask in which patient refused because he didn't want to pay. Dr. Halford Chessman have him a mask last year which is no longer working. Pt came by the office and requested a call back from [facility] nurse in regards to him getting another free mask. Please advise.

## 2018-06-24 NOTE — Telephone Encounter (Signed)
LVM for pt to return call regarding cpap mask. X1

## 2018-06-24 NOTE — Telephone Encounter (Signed)
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

## 2018-06-30 ENCOUNTER — Telehealth: Payer: Self-pay | Admitting: Pulmonary Disease

## 2018-06-30 ENCOUNTER — Other Ambulatory Visit: Payer: Self-pay

## 2018-06-30 ENCOUNTER — Encounter: Payer: Self-pay | Admitting: Internal Medicine

## 2018-06-30 DIAGNOSIS — I10 Essential (primary) hypertension: Secondary | ICD-10-CM

## 2018-06-30 MED ORDER — HYDROCHLOROTHIAZIDE 25 MG PO TABS: 25.0000 mg | ORAL_TABLET | Freq: Every day | ORAL | 2 refills | Status: DC

## 2018-06-30 MED FILL — HYDROCHLOROTHIAZIDE 25 MG T: 25 | 30 days supply | Qty: 30 | Fill #0

## 2018-06-30 MED FILL — TAMSULOSIN HCL 0.4 MG CAP: 0.4 | 30 days supply | Qty: 30 | Fill #1

## 2018-06-30 NOTE — Telephone Encounter (Signed)
Explained to patient that he would need to wait until next ov with [facility] to discuss another free mask. He is having trouble with fit of cpap mask that was given. He states he doesn't have the money to purchase another mask. He may try to contact DME and see if they can work with him because he is not sleeping.. Nothing further needed.

## 2018-06-30 NOTE — Telephone Encounter (Signed)
Left detailed message patient will need to discuss receiving another free cpap mask at next office visit with [facility]. Several attempts for the past 2 weeks have been made to contact patient. Message closed.

## 2018-07-07 MED FILL — AMITRIPTYLINE HCL 10 MG TAB: 10 | 30 days supply | Qty: 30 | Fill #1

## 2018-08-03 MED FILL — HYDROCHLOROTHIAZIDE 25 MG T: 25 | 30 days supply | Qty: 30 | Fill #1

## 2018-08-03 MED FILL — TAMSULOSIN HCL 0.4 MG CAP: 0.4 | 30 days supply | Qty: 30 | Fill #2

## 2018-08-26 ENCOUNTER — Telehealth: Payer: Self-pay | Admitting: Internal Medicine

## 2018-08-26 ENCOUNTER — Other Ambulatory Visit: Payer: Self-pay

## 2018-08-26 ENCOUNTER — Ambulatory Visit (HOSPITAL_COMMUNITY)
Admission: EM | Admit: 2018-08-26 | Discharge: 2018-08-26 | Disposition: A | Discharge status: Home / Self Care | Payer: Self-pay | Attending: Urgent Care | Admitting: Urgent Care

## 2018-08-26 ENCOUNTER — Encounter (HOSPITAL_COMMUNITY): Payer: Self-pay | Admitting: Emergency Medicine

## 2018-08-26 DIAGNOSIS — L299 Pruritus, unspecified: Secondary | ICD-10-CM

## 2018-08-26 DIAGNOSIS — R21 Rash and other nonspecific skin eruption: Secondary | ICD-10-CM

## 2018-08-26 MED ORDER — HYDROXYZINE HCL 25 MG PO TABS: 12.5000 mg | ORAL_TABLET | Freq: Three times a day (TID) | ORAL | 0 refills | Status: DC | PRN

## 2018-08-26 MED ORDER — TRIAMCINOLONE ACETONIDE 0.1 % EX CREA: 1.0000 "application " | TOPICAL_CREAM | Freq: Two times a day (BID) | CUTANEOUS | 0 refills | Status: DC

## 2018-08-26 MED FILL — hydrOXYzine HCL 25 MG TABS: 25 | 10 days supply | Qty: 30 | Fill #0

## 2018-08-26 MED FILL — ?TRIAMCINOLONE 0.1% CRM: 0.1 | 15 days supply | Qty: 30 | Fill #0

## 2018-08-26 NOTE — Telephone Encounter (Signed)
Patient called stating he would like to know when his OC expires please follow up.

## 2018-08-26 NOTE — Telephone Encounter (Signed)
Went in to f/u with appointment, notes that patient went to UC.

## 2018-08-26 NOTE — Telephone Encounter (Signed)
Patient states he is unsure if he has shingles or poison ivy and would like for someone to FU with him. Please follow up.

## 2018-08-26 NOTE — Telephone Encounter (Signed)
Patients call returned.  Patient identified by name and date of birth.  Patient called concerned that he was misdiagnose in the UC with shingles.  Patient was informed for 15 minutes as to the differences in shingles and poison ivy.  Patient acknowledged understanding of advice.

## 2018-08-26 NOTE — ED Triage Notes (Signed)
PT has a red vesicular rash on chest. Thinks it may be poison ivy. PT reports it has been there for 5 days.

## 2018-08-26 NOTE — Telephone Encounter (Signed)
Pt was informed that the CAFA and OC exp 10/23/2018
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
## 2018-08-27 ENCOUNTER — Telehealth: Payer: Self-pay | Admitting: *Deleted
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
## 2018-09-01 MED FILL — ?IBUPROFEN 800 MG TABS: AMNEAL | 7 days supply | Qty: 30 | Fill #0

## 2018-09-03 ENCOUNTER — Inpatient Hospital Stay: Payer: Self-pay

## 2018-09-03 ENCOUNTER — Encounter: Payer: Self-pay | Admitting: Internal Medicine

## 2018-09-03 DIAGNOSIS — N400 Enlarged prostate without lower urinary tract symptoms: Secondary | ICD-10-CM

## 2018-09-03 MED ORDER — ACYCLOVIR 400 MG PO TABS: 400.0000 mg | ORAL_TABLET | Freq: Two times a day (BID) | ORAL | 6 refills | Status: DC

## 2018-09-03 MED ORDER — TAMSULOSIN HCL 0.4 MG PO CAPS: 0.4000 mg | ORAL_CAPSULE | Freq: Every day | ORAL | 6 refills | Status: DC

## 2018-09-04 ENCOUNTER — Other Ambulatory Visit: Payer: Self-pay | Admitting: Internal Medicine

## 2018-09-04 DIAGNOSIS — N400 Enlarged prostate without lower urinary tract symptoms: Secondary | ICD-10-CM

## 2018-09-04 MED FILL — ?ACYCLOVIR 400MG TABS: 400 | 30 days supply | Qty: 60 | Fill #0

## 2018-09-04 MED FILL — ?HYDROCHLOROTHIAZIDE 25MG T: 25 | 30 days supply | Qty: 30 | Fill #2

## 2018-09-04 MED FILL — TAMSULOSIN HCL 0.4 MG CAP: 0.4 | 30 days supply | Qty: 30 | Fill #0

## 2018-09-04 NOTE — Telephone Encounter (Signed)
Please refill if appropriate

## 2018-09-04 NOTE — Telephone Encounter (Signed)
Call placed to patient. LCSW introduced self and explained role at [facility]. Pt agreed to schedule appointment for 09/07/2018 to address behavioral health and/or resource needs.

## 2018-09-07 ENCOUNTER — Ambulatory Visit: Payer: Self-pay | Attending: Family Medicine | Admitting: Licensed Clinical Social Worker

## 2018-09-07 ENCOUNTER — Other Ambulatory Visit: Payer: Self-pay

## 2018-09-07 DIAGNOSIS — F419 Anxiety disorder, unspecified: Secondary | ICD-10-CM
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
## 2018-09-14 ENCOUNTER — Telehealth: Payer: Self-pay | Admitting: Licensed Clinical Social Worker

## 2018-09-14 NOTE — Telephone Encounter (Signed)
LCSW received an email from patient with pictures of medication (Duloxetine) Pt shared he has three refills left and will resume medications. No additional concerns noted.

## 2018-09-28 ENCOUNTER — Encounter: Payer: Self-pay | Admitting: Internal Medicine

## 2018-09-28 DIAGNOSIS — I10 Essential (primary) hypertension: Secondary | ICD-10-CM

## 2018-09-28 NOTE — Telephone Encounter (Signed)
Please mail or call patient with dental resources

## 2018-10-05 ENCOUNTER — Other Ambulatory Visit: Payer: Self-pay | Admitting: Internal Medicine

## 2018-10-05 DIAGNOSIS — I10 Essential (primary) hypertension: Secondary | ICD-10-CM

## 2018-10-05 MED FILL — TAMSULOSIN HCL 0.4 MG CAP: 0.4 | 30 days supply | Qty: 30 | Fill #1

## 2018-10-06 ENCOUNTER — Other Ambulatory Visit: Payer: Self-pay | Admitting: Internal Medicine

## 2018-10-06 DIAGNOSIS — I10 Essential (primary) hypertension: Secondary | ICD-10-CM

## 2018-10-07 MED ORDER — HYDROCHLOROTHIAZIDE 25 MG PO TABS: 25.0000 mg | ORAL_TABLET | Freq: Every day | ORAL | 2 refills | Status: DC

## 2018-10-07 MED FILL — ?HYDROCHLOROTHIAZIDE 25MG T: 25 | 30 days supply | Qty: 30 | Fill #0

## 2018-10-07 NOTE — Addendum Note (Signed)
Addended by: Karle Plumber B on: 10/07/2018 01:24 PM   Modules accepted: Orders

## 2018-10-08 ENCOUNTER — Ambulatory Visit: Payer: Self-pay | Attending: Internal Medicine | Admitting: Physician Assistant

## 2018-10-08 DIAGNOSIS — K0889 Other specified disorders of teeth and supporting structures: Secondary | ICD-10-CM

## 2018-10-08 NOTE — Progress Notes (Signed)
Patient ID: Samuel Irwin, male   DOB: 10/17/1960, 58 y.o.   MRN: [id number] Virtual Visit via Telephone Note  I connected with [name] on 10/08/18 at 10:10 AM EDT by telephone and verified that I am speaking with the correct person using two identifiers.   I discussed the limitations, risks, security and privacy concerns of performing an evaluation and management service by telephone and the availability of in person appointments. I also discussed with the patient that there may be a patient responsible charge related to this service. The patient expressed understanding and agreed to proceed.  Patient location:  home My Location:  [place] office Persons on the call:  Me and the patient   History of Present Illness: Got a tooth pulled last month in [place] that had been causing him problems for a while.  He is having some pain and swelling at the gum where the tooth was pulled.  No fever or sign of infection.    Observations/Objective:  A&Ox3   Assessment and Plan: 1. Tooth pain Call dentist in [place] and get appt for f/up.  Salt water gargles and ibuprofen for pain.      Follow Up Instructions:    I discussed the assessment and treatment plan with the patient. The patient was provided an opportunity to ask questions and all were answered. The patient agreed with the plan and demonstrated an understanding of the instructions.   The patient was advised to call back or seek an in-person evaluation if the symptoms worsen or if the condition fails to improve as anticipated.  I provided 11 minutes of non-face-to-face time during this encounter.   [name], PA-C

## 2018-10-08 NOTE — Progress Notes (Signed)
Patient has been called and DOB has been verified. Patient has been screened and transferred to PCP to start phone visit.  Patient is having dental pain

## 2018-10-14 MED FILL — PENICILLIN VK 500 MG TABLET: 500 | 7 days supply | Qty: 28 | Fill #0

## 2018-10-14 MED FILL — ?IBUPROFEN 800 MG TABS: AMNEAL | 7 days supply | Qty: 30 | Fill #0

## 2018-10-14 MED FILL — ?ACYCLOVIR 400MG TABS: 400 | 30 days supply | Qty: 60 | Fill #1

## 2018-10-27 ENCOUNTER — Encounter: Payer: Self-pay | Admitting: Internal Medicine

## 2018-10-28 ENCOUNTER — Ambulatory Visit: Payer: Self-pay | Admitting: Pharmacist

## 2018-10-29 ENCOUNTER — Other Ambulatory Visit: Payer: Self-pay

## 2018-10-29 ENCOUNTER — Encounter (HOSPITAL_COMMUNITY): Payer: Self-pay

## 2018-10-29 ENCOUNTER — Ambulatory Visit (HOSPITAL_COMMUNITY)
Admission: EM | Admit: 2018-10-29 | Discharge: 2018-10-29 | Disposition: A | Discharge status: Home / Self Care | Payer: Self-pay | Attending: Family Medicine | Admitting: Family Medicine

## 2018-10-29 DIAGNOSIS — L509 Urticaria, unspecified: Secondary | ICD-10-CM

## 2018-10-29 MED ORDER — PREDNISONE 10 MG (21) PO TBPK: ORAL_TABLET | Freq: Every day | ORAL | 0 refills | Status: DC

## 2018-10-29 MED FILL — predniSONE 10 MG TABS: 10 | 5 days supply | Qty: 21 | Fill #0

## 2018-10-29 NOTE — ED Triage Notes (Signed)
Pt states his hands are swelling and he has a rash all over.  This started yesterday.

## 2018-10-29 NOTE — ED Provider Notes (Signed)
[name]   [id number] 10/29/18 Arrival Time: [id number]  ASSESSMENT & PLAN:  1. Urticaria     Unknown trigger. No s/s of anaphylaxis.  To begin: Meds ordered this encounter  Medications  . predniSONE (STERAPRED UNI-PAK 21 TAB) 10 MG (21) TBPK tablet    Sig: Take by mouth daily. Take as directed.    Dispense:  21 tablet    Refill:  0   Atarax as needed. Has Rx already.  Follow-up Information    Ladell Pier, MD.   Specialty: Internal Medicine Why: As needed. Contact information: [place] [address] [phone]        [place].   Specialty: Urgent Care Why: As needed. Contact information: [place] [phone]         Reviewed expectations re: course of current medical issues. Questions answered. Outlined signs and symptoms indicating need for more acute intervention. Patient verbalized understanding. After Visit Summary given.   SUBJECTIVE: History from: patient. Samuel Irwin is a 58 y.o. male who presents for evaluation of a possible allergic reaction. Possible triggers: have not been identified. Reports hives with significant associated itching. Reports abrupt onset starting 1 day ago. Clinical course: unchanged. Took one Atarax with some relief. No specific aggravating factors. H/O similar in the past. Denies chest pain, hemoptysis, non productive cough, productive cough, shortness of breath and wheezing. Patient denies exposure to new medications or allergens.  ROS: As per HPI. All other systems negative.    OBJECTIVE:  Vitals:   10/29/18 1449 10/29/18 1450  BP: 120/80   Pulse: 82   Resp: 18   Temp: 99 F (37.2 C)   TempSrc: Oral   SpO2: 100%   Weight:  106.6 kg    General appearance: alert; no distress Eyes: PERRLA; EOMI; conjunctiva normal HENT: normocephalic; atraumatic; oral mucosa normal Neck: supple  Lungs: clear to  auscultation bilaterally Heart: regular rate and rhythm Abdomen: soft Extremities: no edema; symmetrical with no gross deformities Skin: warm and dry; smooth, slightly elevated and erythematous plaques of variable size over entire body Neurologic: normal gait Psychological: alert and cooperative; normal mood and affect   No Known Allergies  Past Medical History:  Diagnosis Date  . Constipation   . Cough   . Generalized headaches   . GERD (gastroesophageal reflux disease)   . H/O hiatal hernia   . Heart murmur   . Herpes   . Hypertension   . Sleep apnea    uses BIPAP  . Umbilical hernia   . Weakness   . Wheezing    Social History   Socioeconomic History  . Marital status: Single    Spouse name: Not on file  . Number of children: Not on file  . Years of education: Not on file  . Highest education level: Not on file  Occupational History  . Occupation: unemployed  Social Needs  . Financial resource strain: Not on file  . Food insecurity    Worry: Not on file    Inability: Not on file  . Transportation needs    Medical: Not on file    Non-medical: Not on file  Tobacco Use  . Smoking status: Current Some Day Smoker    Packs/day: 0.25    Years: 43.00    Pack years: 10.75    Types: Cigarettes  . Smokeless tobacco: Never Used  . Tobacco comment: occas still smokes when  he drinks  Substance and Sexual Activity  . Alcohol use: Yes    Comment: once weekly  . Drug use: Not Currently    Types: Cocaine, "Crack" cocaine    Comment: smoked crack  . Sexual activity: Not on file    Comment: "when I drink"  Lifestyle  . Physical activity    Days per week: Not on file    Minutes per session: Not on file  . Stress: Not on file  Relationships  . Social Herbalist on phone: Not on file    Gets together: Not on file    Attends religious service: Not on file    Active member of club or organization: Not on file    Attends meetings of clubs or organizations:  Not on file    Relationship status: Not on file  . Intimate partner violence    Fear of current or ex partner: Not on file    Emotionally abused: Not on file    Physically abused: Not on file    Forced sexual activity: Not on file  Other Topics Concern  . Not on file  Social History Narrative  . Not on file   Family History  Problem Relation Age of Onset  . Hypertension Mother   . Cancer Father        lung  . Colon cancer Neg Hx    Past Surgical History:  Procedure Laterality Date  . BIOPSY  10/18/2014   Procedure: BIOPSY (Gastric);  Surgeon: Danie Binder, MD;  Location: [facility];  Service: Endoscopy;;  . CARPAL TUNNEL RELEASE     R hand  . COLONOSCOPY WITH PROPOFOL N/A 10/18/2014   [id number] size external hemorrhoids/left colon is redundant  . ESOPHAGOGASTRODUODENOSCOPY (EGD) WITH PROPOFOL N/A 10/18/2014   SLF: epigastric pain due to moderate NSAID gastritis  . FEMUR SURGERY     left  . HERNIA REPAIR  04/07/12   umb hernia repair  . INSERTION OF MESH N/A 04/07/2012   Procedure: INSERTION OF MESH;  Surgeon: Madilyn Hook, DO;  Location: [place];  Service: General;  Laterality: N/A;  . KNEE ARTHROSCOPY     right  . MINOR CARPAL TUNNEL Left 01/31/2016   Procedure: MINOR CARPAL TUNNEL RELEASE LEFT;  Surgeon: Charlotte Crumb, MD;  Location: [place];  Service: Orthopedics;  Laterality: Left;  Local  . TOE SURGERY     dislocated lt foot  . UMBILICAL HERNIA REPAIR N/A 04/07/2012   Procedure: HERNIA REPAIR UMBILICAL ADULT;  Surgeon: Madilyn Hook, DO;  Location: [place];  Service: General;  Laterality: N/A;  open umbilical hernia repair with mesh     Vanessa Kick, MD 10/29/18 1739

## 2018-10-30 ENCOUNTER — Ambulatory Visit: Payer: Self-pay | Attending: Family Medicine | Admitting: Pharmacist

## 2018-10-30 DIAGNOSIS — Z23 Encounter for immunization: Secondary | ICD-10-CM

## 2018-10-30 NOTE — Progress Notes (Signed)
Patient presents for vaccination against influenza per orders of Dr. Johnson. Consent given. Counseling provided. No contraindications exists. Vaccine administered without incident.   

## 2018-11-03 ENCOUNTER — Other Ambulatory Visit: Payer: Self-pay | Admitting: Physician Assistant

## 2018-11-03 DIAGNOSIS — K219 Gastro-esophageal reflux disease without esophagitis: Secondary | ICD-10-CM

## 2018-11-03 MED FILL — TAMSULOSIN HCL 0.4 MG CAP: 0.4 | 30 days supply | Qty: 30 | Fill #2

## 2018-11-03 MED FILL — ?HYDROCHLOROTHIAZIDE 25MG T: 25 | 30 days supply | Qty: 30 | Fill #1

## 2018-11-04 ENCOUNTER — Encounter: Payer: Self-pay | Admitting: Internal Medicine

## 2018-11-04 MED FILL — ?OMEPRAZOLE 20MG CAP DR: 20 | 30 days supply | Qty: 30 | Fill #0

## 2018-11-04 NOTE — Telephone Encounter (Signed)
Please fill if appropriate.  

## 2018-11-10 MED FILL — ?IBUPROFEN 800 MG TABS: AMNEAL | 7 days supply | Qty: 30 | Fill #0

## 2018-11-16 MED FILL — ?ACYCLOVIR 400MG TABS: 400 | 30 days supply | Qty: 60 | Fill #2

## 2018-11-17 ENCOUNTER — Other Ambulatory Visit: Payer: Self-pay

## 2018-11-17 ENCOUNTER — Ambulatory Visit (INDEPENDENT_AMBULATORY_CARE_PROVIDER_SITE_OTHER): Payer: Self-pay | Admitting: Primary Care

## 2018-11-17 DIAGNOSIS — Z20822 Contact with and (suspected) exposure to covid-19: Secondary | ICD-10-CM

## 2018-11-17 MED FILL — CLINDAMYCIN HCL 150 MG CAPS: 150 | 7 days supply | Qty: 21 | Fill #0

## 2018-11-18 LAB — NOVEL CORONAVIRUS, NAA: SARS-CoV-2, NAA: NOT DETECTED

## 2018-12-03 ENCOUNTER — Encounter: Payer: Self-pay | Admitting: Internal Medicine

## 2018-12-04 MED FILL — TAMSULOSIN HCL 0.4 MG CAP: 0.4 | 30 days supply | Qty: 30 | Fill #3

## 2018-12-04 MED FILL — ?HYDROCHLOROTHIAZIDE 25MG T: 25 | 30 days supply | Qty: 30 | Fill #2

## 2018-12-16 MED FILL — ?ACYCLOVIR 400MG TABS: 400 | 30 days supply | Qty: 60 | Fill #3

## 2018-12-18 ENCOUNTER — Ambulatory Visit (HOSPITAL_COMMUNITY)
Admission: RE | Admit: 2018-12-18 | Discharge: 2018-12-18 | Disposition: A | Discharge status: Home / Self Care | Payer: Self-pay | Source: Ambulatory Visit | Attending: Family Medicine | Admitting: Family Medicine

## 2018-12-18 ENCOUNTER — Ambulatory Visit: Payer: Self-pay | Attending: Family Medicine | Admitting: Family Medicine

## 2018-12-18 ENCOUNTER — Other Ambulatory Visit: Payer: Self-pay

## 2018-12-18 ENCOUNTER — Encounter: Payer: Self-pay | Admitting: Family Medicine

## 2018-12-18 VITALS — BP 116/79 | HR 72 | Temp 98.4°F | Ht 68.0 in | Wt 247.2 lb

## 2018-12-18 DIAGNOSIS — J40 Bronchitis, not specified as acute or chronic: Secondary | ICD-10-CM

## 2018-12-18 DIAGNOSIS — M25461 Effusion, right knee: Secondary | ICD-10-CM

## 2018-12-18 DIAGNOSIS — Z720 Tobacco use: Secondary | ICD-10-CM

## 2018-12-18 MED ORDER — DOXYCYCLINE HYCLATE 100 MG PO TABS: 100.0000 mg | ORAL_TABLET | Freq: Two times a day (BID) | ORAL | 0 refills | Status: DC

## 2018-12-18 MED ORDER — PREDNISONE 20 MG PO TABS: ORAL_TABLET | ORAL | 0 refills | Status: DC

## 2018-12-18 MED FILL — predniSONE 20 MG TABS: 20 | 5 days supply | Qty: 10 | Fill #0

## 2018-12-18 MED FILL — ?DOXYCYCLINE HYCLATE 100MG: 100 | 10 days supply | Qty: 20 | Fill #0

## 2018-12-18 NOTE — Progress Notes (Signed)
Per pt he is having crackling sounds in his chest only when he lays downs. Do not hear it when he's standing and moving around

## 2018-12-18 NOTE — Progress Notes (Signed)
Established Patient Office Visit  Subjective:  Patient ID: Samuel Irwin, male    DOB: March 06, 1960  Age: 58 y.o. MRN: [id number]  CC: per CMA, patient with complaint of a crackling noise in his chest when he is lying down at night  HPI [name], 58 year old male patient of Dr. [name] who presents for acute office visit due to the complaint of hearing a crackling sensation in his chest/lungs at night when he is lying down.  Patient states that this has been happening now for a few weeks.  He has some mild increase in chest congestion.  He has also had a mild cough and sputum is mostly clear.  He denies any recent fever or chills.  He has had no known exposure to anyone with COVID-19.  He denies any increase in nasal congestion, no loss of sense of taste and smell.  He denies headaches or dizziness.  Patient states that he does use of BiPAP and he is compliant with use.  He states that he hears the crackling sensation in his chest when lying down prior to putting on his CPAP mask.  He reports that he does not really tolerate the CPAP mask well but continues to use it.  Patient continues to smoke but states that he only smokes when he drinks beer.  He reports that he drinks beer about 2-3 times per week.  He does not believe that his smoking is affecting his lung function.  He reports that he continues to exercise either by swimming or with the use of an elliptical.  He tries to swim or use the elliptical to avoid putting pressure on his knees.  He reports however a few weeks ago when he was using the elliptical he had sudden onset of swelling and discomfort in his right knee.  He denies any current knee pain but states that his right knee remains slightly swollen.  He has been taking over-the-counter nonsteroidal anti-inflammatories.  He is concerned about the continued swelling in his knee.  Past Medical History:  Diagnosis Date  . Constipation   . Cough   . Generalized headaches   . GERD  (gastroesophageal reflux disease)   . H/O hiatal hernia   . Heart murmur   . Herpes   . Hypertension   . Sleep apnea    uses BIPAP  . Umbilical hernia   . Weakness   . Wheezing     Past Surgical History:  Procedure Laterality Date  . BIOPSY  10/18/2014   Procedure: BIOPSY (Gastric);  Surgeon: Danie Binder, MD;  Location: [facility];  Service: Endoscopy;;  . CARPAL TUNNEL RELEASE     R hand  . COLONOSCOPY WITH PROPOFOL N/A 10/18/2014   [id number] size external hemorrhoids/left colon is redundant  . ESOPHAGOGASTRODUODENOSCOPY (EGD) WITH PROPOFOL N/A 10/18/2014   SLF: epigastric pain due to moderate NSAID gastritis  . FEMUR SURGERY     left  . HERNIA REPAIR  04/07/12   umb hernia repair  . INSERTION OF MESH N/A 04/07/2012   Procedure: INSERTION OF MESH;  Surgeon: Madilyn Hook, DO;  Location: [place];  Service: General;  Laterality: N/A;  . KNEE ARTHROSCOPY     right  . MINOR CARPAL TUNNEL Left 01/31/2016   Procedure: MINOR CARPAL TUNNEL RELEASE LEFT;  Surgeon: Charlotte Crumb, MD;  Location: [place];  Service: Orthopedics;  Laterality: Left;  Local  . TOE SURGERY     dislocated lt foot  . UMBILICAL  HERNIA REPAIR N/A 04/07/2012   Procedure: HERNIA REPAIR UMBILICAL ADULT;  Surgeon: Madilyn Hook, DO;  Location: [place];  Service: General;  Laterality: N/A;  open umbilical hernia repair with mesh    Family History  Problem Relation Age of Onset  . Hypertension Mother   . Cancer Father        lung  . Colon cancer Neg Hx     Social History   Socioeconomic History  . Marital status: Single    Spouse name: Not on file  . Number of children: Not on file  . Years of education: Not on file  . Highest education level: Not on file  Occupational History  . Occupation: unemployed  Social Needs  . Financial resource strain: Not on file  . Food insecurity    Worry: Not on file    Inability: Not on file  . Transportation needs    Medical: Not on file    Non-medical:  Not on file  Tobacco Use  . Smoking status: Current Some Day Smoker    Packs/day: 0.25    Years: 43.00    Pack years: 10.75    Types: Cigarettes  . Smokeless tobacco: Never Used  . Tobacco comment: occas still smokes when he drinks  Substance and Sexual Activity  . Alcohol use: Yes    Comment: once weekly  . Drug use: Not Currently    Types: Cocaine, "Crack" cocaine    Comment: smoked crack  . Sexual activity: Not on file    Comment: "when I drink"  Lifestyle  . Physical activity    Days per week: Not on file    Minutes per session: Not on file  . Stress: Not on file  Relationships  . Social Herbalist on phone: Not on file    Gets together: Not on file    Attends religious service: Not on file    Active member of club or organization: Not on file    Attends meetings of clubs or organizations: Not on file    Relationship status: Not on file  . Intimate partner violence    Fear of current or ex partner: Not on file    Emotionally abused: Not on file    Physically abused: Not on file    Forced sexual activity: Not on file  Other Topics Concern  . Not on file  Social History Narrative  . Not on file    Outpatient Medications Prior to Visit  Medication Sig Dispense Refill  . acyclovir (ZOVIRAX) 400 MG tablet Take 1 tablet (400 mg total) by mouth 2 (two) times daily. 60 tablet 6  . amitriptyline (ELAVIL) 10 MG tablet Take 1 tablet (10 mg total) by mouth at bedtime. 30 tablet 1  . aspirin EC 81 MG tablet Take 81 mg by mouth daily.    . cetirizine (ZYRTEC) 10 MG tablet Take 1 tablet (10 mg total) by mouth daily. 30 tablet 3  . diclofenac sodium (VOLTAREN) 1 % GEL Apply 2 g topically 2 (two) times daily as needed. 100 g 4  . fluticasone (FLONASE) 50 MCG/ACT nasal spray Place 1 spray into both nostrils daily. 16 g 2  . gabapentin (NEURONTIN) 300 MG capsule Take 1 capsule (300 mg total) by mouth 2 (two) times daily. For foot pain 60 capsule 3  .  glucosamine-chondroitin 500-400 MG tablet Take 1-2 tablets by mouth daily.    . hydrochlorothiazide (HYDRODIURIL) 25 MG tablet Take 1 tablet (25 mg  total) by mouth daily. 30 tablet 2  . hydrOXYzine (ATARAX/VISTARIL) 25 MG tablet Take 0.5-1 tablets (12.5-25 mg total) by mouth every 8 (eight) hours as needed for itching. 30 tablet 0  . ibuprofen (ADVIL,MOTRIN) 800 MG tablet Take 800 mg by mouth every 6 (six) hours as needed for pain.  0  . meclizine (ANTIVERT) 25 MG tablet Take 1 tablet (25 mg total) by mouth 3 (three) times daily as needed for dizziness. 30 tablet 0  . Multiple Vitamin (MULTIVITAMIN) capsule Take 1 capsule by mouth daily.    Marland Kitchen omeprazole (PRILOSEC) 20 MG capsule TAKE 1 CAPSULE BY MOUTH DAILY AS NEEDED (HEART BURN). 30 capsule 2  . predniSONE (STERAPRED UNI-PAK 21 TAB) 10 MG (21) TBPK tablet Take by mouth daily. Take as directed. 21 tablet 0  . tadalafil (CIALIS) 10 MG tablet Take 2 tablets daily as needed for erectile dysfunction. 60 tablet 3  . tamsulosin (FLOMAX) 0.4 MG CAPS capsule Take 1 capsule (0.4 mg total) by mouth daily. 30 capsule 6  . triamcinolone cream (KENALOG) 0.1 % Apply 1 application topically 2 (two) times daily. 30 g 0  . Vitamin D, Ergocalciferol, (DRISDOL) 50000 units CAPS capsule Take 1 capsule (50,000 Units total) by mouth every 7 (seven) days. (Patient taking differently: Take 50,000 Units by mouth every Monday. ) 16 capsule 0   No facility-administered medications prior to visit.     No Known Allergies  ROS Review of Systems  Constitutional: Negative for chills, fatigue and fever.  HENT: Negative for congestion, sore throat and trouble swallowing.   Eyes: Negative for photophobia and visual disturbance.  Respiratory: Positive for cough and shortness of breath (with certain exercise).   Cardiovascular: Negative for chest pain, palpitations and leg swelling.  Gastrointestinal: Negative for abdominal pain, blood in stool, constipation, diarrhea and  nausea.  Endocrine: Negative for polydipsia, polyphagia and polyuria.  Genitourinary: Negative for dysuria and frequency.  Musculoskeletal: Positive for arthralgias and joint swelling.  Neurological: Negative for dizziness and headaches.  Hematological: Negative for adenopathy. Does not bruise/bleed easily.      Objective:    Physical Exam  Constitutional: He is oriented to person, place, and time. He appears well-developed and well-nourished.  Neck: Normal range of motion. Neck supple. No JVD present.  Cardiovascular: Normal rate and regular rhythm.  Pulmonary/Chest: Effort normal and breath sounds normal. He has no wheezes.  Decreased BS at the lung bases. Some increase in time of expiration  Abdominal: Soft. There is no abdominal tenderness. There is no rebound and no guarding.  Musculoskeletal:        General: Edema present. No tenderness.     Comments: Mild generalized effusion of the right knee; joint is otherwise stable  Lymphadenopathy:    He has no cervical adenopathy.  Neurological: He is alert and oriented to person, place, and time.  Skin: Skin is warm and dry.  Psychiatric: He has a normal mood and affect. His behavior is normal.  Nursing note and vitals reviewed.   BP 116/79 (BP Location: Left Arm, Patient Position: Sitting, Cuff Size: Large)   Pulse 72   Temp 98.4 F (36.9 C) (Oral)   Ht 5\' 8"  (1.727 m)   Wt 247 lb 3.2 oz (112.1 kg)   SpO2 95%   BMI 37.59 kg/m  Wt Readings from Last 3 Encounters:  10/29/18 235 lb (106.6 kg)  04/02/18 224 lb 9.6 oz (101.9 kg)  02/12/18 224 lb 6.4 oz (101.8 kg)     Lab  Results  Component Value Date   TSH 1.450 04/02/2018   Lab Results  Component Value Date   WBC 5.7 12/27/2016   HGB 15.0 12/27/2016   HCT 44.7 12/27/2016   MCV 90.9 12/27/2016   PLT 273 12/27/2016   Lab Results  Component Value Date   NA 139 04/26/2018   K 3.7 04/26/2018   CO2 25 04/26/2018   GLUCOSE 105 (H) 04/26/2018   BUN <5 (L) 04/26/2018     CREATININE 1.03 04/26/2018   BILITOT 0.5 04/26/2018   ALKPHOS 63 04/26/2018   AST 20 04/26/2018   ALT 21 04/26/2018   PROT 7.3 04/26/2018   ALBUMIN 4.0 04/26/2018   CALCIUM 9.5 04/26/2018   ANIONGAP 8 04/26/2018   Lab Results  Component Value Date   CHOL 177 06/24/2017   Lab Results  Component Value Date   HDL 49 06/24/2017   Lab Results  Component Value Date   LDLCALC 104 (H) 06/24/2017   Lab Results  Component Value Date   TRIG 120 06/24/2017   Lab Results  Component Value Date   CHOLHDL 3.6 06/24/2017   Lab Results  Component Value Date   HGBA1C 5.5 04/02/2018      Assessment & Plan:  1. Bronchitis I discussed with patient that with his symptoms and exam, I am concerned that he has COPD. Will have patient obtain a CXR and will have patient follow-up with a pulmonologist. In the interim he will be treated with doxycycline, prednisone and otc medication such as Mucinex for chest congestion. He is encouraged to completely stop smoking. Patient does not believe that his lungs have been affected by his smoking.  - DG Chest 2 View; Future - doxycycline (VIBRA-TABS) 100 MG tablet; Take 1 tablet (100 mg total) by mouth 2 (two) times daily.  Dispense: 20 tablet; Refill: 0 - predniSONE (DELTASONE) 20 MG tablet; 2 pills by mouth once daily for 5 days; take after eating  Dispense: 10 tablet; Refill: 0  2. Swelling of joint of right knee RX for prednisone which should also help with his knee swelling. He is being referred to Orthopedics for further evaluation. Patient reported no pain on exam however patient talked throughout exam and did not really answer when asked about joint line tenderness.  - Ambulatory referral to Orthopedic Surgery  3. Tobacco use Discussed the importance of complete smoking cessation and information given to patient regarding quitting smoking.   An After Visit Summary was printed and given to the patient.  Follow-up: Return for please schedule  with Dr. Joya Gaskins in 2-3 weeks.   Antony Blackbird, MD

## 2018-12-18 NOTE — Patient Instructions (Signed)
Knee Effusion  Knee effusion means that you have extra fluid in your knee. This can cause pain. Your knee may be more difficult to bend and move. What are the causes? Common causes are:  Arthritis.  Infection.  Injury.  Autoimmune disease. This means that your body's defense system (immune system) mistakenly attacks healthy body tissues. Follow these instructions at home: Medicines  Take medicines only as told by your doctor.  Do not drive or use heavy machinery while taking prescription pain medicine.  If you are taking prescription pain medicine, take actions to help prevent or treat trouble pooping (constipation). Your doctor may recommend that you: ? Drink enough fluid to keep your pee (urine) pale yellow. ? Eat foods that are high in fiber. These include fresh fruits and vegetables, whole grains, and beans. ? Avoid eating fatty or sweet foods. ? Take a medicine for constipation. If you have a brace:  Wear the brace as told by your doctor. Remove it only as told by your doctor.  Loosen the brace if your toes tingle, become numb, or turn cold and blue.  Keep the brace clean.  If the brace is not waterproof: ? Do not let it get wet. ? Cover it with a watertight covering when you take a bath or a shower. Managing pain, stiffness, and swelling   If told, apply ice to the swollen area: ? If you have a removable brace, remove it as told by your doctor. ? Put ice in a plastic bag. ? Place a towel between your skin and the bag. ? Leave the ice on for 20 minutes, 2-3 times per day.  Keep your knee raised (elevated) when you are sitting or lying down. General instructions  Do not use any products that contain nicotine or tobacco, such as cigarettes and e-cigarettes. These can delay healing. If you need help quitting, ask your doctor.  Use crutches as told by your doctor.  Do exercises as told by your doctor.  Rest as told by your doctor.  Keep all follow-up visits as  told by your doctor. This is important. Contact a doctor if you:  Continue to have pain in your knee. Get help right away if you:  Have swelling or redness of your knee that gets worse or does not get better.  Have serious pain in your knee.  Have a fever. Summary  Knee effusion is when you have extra fluid in your knee. This causes pain and swelling and makes it hard to bend and move your knee.  Take medicines only as told by your doctor.  If you have a brace, wear the brace as told by your doctor. This information is not intended to replace advice given to you by your health care provider. Make sure you discuss any questions you have with your health care provider. Document Released: 03/16/2010 Document Revised: 02/24/2017 Document Reviewed: 02/23/2017 Elsevier Patient Education  2020 [employer].

## 2018-12-29 ENCOUNTER — Encounter: Payer: Self-pay | Admitting: Critical Care Medicine

## 2018-12-29 ENCOUNTER — Ambulatory Visit: Payer: Self-pay | Attending: Critical Care Medicine | Admitting: Critical Care Medicine

## 2018-12-29 ENCOUNTER — Other Ambulatory Visit: Payer: Self-pay

## 2018-12-29 VITALS — BP 130/87 | HR 71 | Temp 98.3°F | Ht 68.0 in | Wt 244.6 lb

## 2018-12-29 DIAGNOSIS — Z20828 Contact with and (suspected) exposure to other viral communicable diseases: Secondary | ICD-10-CM

## 2018-12-29 DIAGNOSIS — R053 Chronic cough: Secondary | ICD-10-CM | POA: Insufficient documentation

## 2018-12-29 DIAGNOSIS — K219 Gastro-esophageal reflux disease without esophagitis: Secondary | ICD-10-CM

## 2018-12-29 DIAGNOSIS — J302 Other seasonal allergic rhinitis: Secondary | ICD-10-CM

## 2018-12-29 DIAGNOSIS — G4733 Obstructive sleep apnea (adult) (pediatric): Secondary | ICD-10-CM

## 2018-12-29 DIAGNOSIS — Z01812 Encounter for preprocedural laboratory examination: Secondary | ICD-10-CM

## 2018-12-29 DIAGNOSIS — R05 Cough: Secondary | ICD-10-CM

## 2018-12-29 MED ORDER — PANTOPRAZOLE SODIUM 40 MG PO TBEC: 40.0000 mg | DELAYED_RELEASE_TABLET | Freq: Every day | ORAL | 3 refills | Status: DC

## 2018-12-29 MED ORDER — AZELASTINE HCL 0.1 % NA SOLN: 2.0000 | Freq: Two times a day (BID) | NASAL | 12 refills | Status: DC

## 2018-12-29 MED ORDER — FLUTICASONE PROPIONATE 50 MCG/ACT NA SUSP: 2.0000 | Freq: Every day | NASAL | 2 refills | Status: DC

## 2018-12-29 MED FILL — PANTOPRAZOLE SOD DR 40 MG T: 40 | 30 days supply | Qty: 30 | Fill #0

## 2018-12-29 MED FILL — FLUTICASONE PROP 50 MCG SPR: 50 | 30 days supply | Qty: 16 | Fill #0

## 2018-12-29 MED FILL — AZELASTINE HCL 137 MCG SPRY: 0.1 | 30 days supply | Qty: 30 | Fill #0

## 2018-12-29 NOTE — Assessment & Plan Note (Signed)
Severe reflux disease  We will change to Protonix 40 mg daily and discontinue omeprazole and give the patient a reflux diet to review

## 2018-12-29 NOTE — Patient Instructions (Addendum)
In order to get a free mask we will need to send you to [facility] for a desensitization  Begin Protonix 1 daily for reflux discontinue omeprazole  Continue Flonase 2 sprays each nostril daily and begin Astelin 2 sprays each nostril twice daily for nasal inflammation  Discontinue the Zyrtec  A lung function study will be obtained and we will need to do a Covid test prior to getting the lung function test scheduled   Return to Dr. Joya Gaskins in 1 month  Follow reflux diet as below Food Choices for Gastroesophageal Reflux Disease, Adult When you have gastroesophageal reflux disease (GERD), the foods you eat and your eating habits are very important. Choosing the right foods can help ease your discomfort. Think about working with a nutrition specialist (dietitian) to help you make good choices. What are tips for following this plan?  Meals  Choose healthy foods that are low in fat, such as fruits, vegetables, whole grains, low-fat dairy products, and lean meat, fish, and poultry.  Eat small meals often instead of 3 large meals a day. Eat your meals slowly, and in a place where you are relaxed. Avoid bending over or lying down until 2-3 hours after eating.  Avoid eating meals 2-3 hours before bed.  Avoid drinking a lot of liquid with meals.  Cook foods using methods other than frying. Bake, grill, or broil food instead.  Avoid or limit: ? Chocolate. ? Peppermint or spearmint. ? Alcohol. ? Pepper. ? Black and decaffeinated coffee. ? Black and decaffeinated tea. ? Bubbly (carbonated) soft drinks. ? Caffeinated energy drinks and soft drinks.  Limit high-fat foods such as: ? Fatty meat or fried foods. ? Whole milk, cream, butter, or ice cream. ? Nuts and nut butters. ? Pastries, donuts, and sweets made with butter or shortening.  Avoid foods that cause symptoms. These foods may be different for everyone. Common foods that cause symptoms include: ? Tomatoes. ? Oranges,  lemons, and limes. ? Peppers. ? Spicy food. ? Onions and garlic. ? Vinegar. Lifestyle  Maintain a healthy weight. Ask your doctor what weight is healthy for you. If you need to lose weight, work with your doctor to do so safely.  Exercise for at least 30 minutes for 5 or more days each week, or as told by your doctor.  Wear loose-fitting clothes.  Do not smoke. If you need help quitting, ask your doctor.  Sleep with the head of your bed higher than your feet. Use a wedge under the mattress or blocks under the bed frame to raise the head of the bed. Summary  When you have gastroesophageal reflux disease (GERD), food and lifestyle choices are very important in easing your symptoms.  Eat small meals often instead of 3 large meals a day. Eat your meals slowly, and in a place where you are relaxed.  Limit high-fat foods such as fatty meat or fried foods.  Avoid bending over or lying down until 2-3 hours after eating.  Avoid peppermint and spearmint, caffeine, alcohol, and chocolate. This information is not intended to replace advice given to you by your health care provider. Make sure you discuss any questions you have with your health care provider. Document Released: 08/13/2011 Document Revised: 06/04/2018 Document Reviewed: 03/19/2016 Elsevier Patient Education  2020 [employer].

## 2018-12-29 NOTE — Progress Notes (Signed)
2 wks f/u   Refills for Glucosamine-Chondroitin and Gabapentin

## 2018-12-29 NOTE — Assessment & Plan Note (Signed)
History of obstructive sleep apnea now on BiPAP needing in new facemask and does not have insurance to pay for the new mask  We will have the patient go to the sleep lab for a desensitization procedure and see if he can obtain the mask for free

## 2018-12-29 NOTE — Assessment & Plan Note (Signed)
Chronic cough associated with episodes of bronchitis in an active smoker  Also history of perceived wheezes and crackles in breathing despite negative chest x-ray  Will obtain full pulmonary function testing and for this will need to do a Covid screening prior to the examination

## 2018-12-29 NOTE — Assessment & Plan Note (Signed)
Significant seasonal allergic rhinitis with nasal turbinate edema  We will add Astelin 2 sprays each nostril twice daily, discontinue Zyrtec, increase Flonase to 2 sprays each nostril daily

## 2018-12-29 NOTE — Progress Notes (Signed)
Subjective:    Patient ID: Samuel Irwin, male    DOB: 23-Sep-1960, 58 y.o.   MRN: [id number]  58 y.o.M PCP patient of DR [name] here for 2 week f/u for Rx of Bronchitis  This patient relates a history of hearing the sensation of crackling in the chest and lungs particularly when laying flat and associated with this has had a mild cough.  Patient also notes significant nasal congestion and postnasal drainage with this.  He has been an active smoker but quit smoking a week ago.  Patient also uses BiPAP and wears a mask that he feels is not tolerating well and would like to have a new mask obtained.  The patient also complains of significant heartburn worse at night and wakes him up from sleep.  He has been using omeprazole as needed.  Patient has a history of the hypertension and diagnosed reflux disease previously.  He has allergic rhinitis as well.  A chest x-ray had been obtained and was unremarkable.  Please review cough assessment below.  Cough This is a new problem. The current episode started more than 1 month ago. The problem has been unchanged. Episode frequency: worse lay flat, cough is intermittent. The cough is non-productive. Associated symptoms include postnasal drip, rhinorrhea, shortness of breath and wheezing. Pertinent negatives include no chest pain, ear pain or sore throat. Associated symptoms comments: Dyspnea is minimal. Crackle sound if breath. The symptoms are aggravated by lying down. Risk factors for lung disease include smoking/tobacco exposure (quit smoking last week). His past medical history is significant for bronchitis. There is no history of pneumonia. OSA on cpap   Past Medical History:  Diagnosis Date  . Constipation   . Cough   . Generalized headaches   . GERD (gastroesophageal reflux disease)   . H/O hiatal hernia   . Heart murmur   . Herpes   . Hypertension   . Sleep apnea    uses BIPAP  . Umbilical hernia   . Weakness   . Wheezing      Family  History  Problem Relation Age of Onset  . Hypertension Mother   . Cancer Father        lung  . Colon cancer Neg Hx      Social History   Socioeconomic History  . Marital status: Single    Spouse name: Not on file  . Number of children: Not on file  . Years of education: Not on file  . Highest education level: Not on file  Occupational History  . Occupation: unemployed  Social Needs  . Financial resource strain: Not on file  . Food insecurity    Worry: Not on file    Inability: Not on file  . Transportation needs    Medical: Not on file    Non-medical: Not on file  Tobacco Use  . Smoking status: Current Some Day Smoker    Packs/day: 0.25    Years: 43.00    Pack years: 10.75    Types: Cigarettes  . Smokeless tobacco: Never Used  . Tobacco comment: occas still smokes when he drinks  Substance and Sexual Activity  . Alcohol use: Yes    Comment: once weekly  . Drug use: Not Currently    Types: Cocaine, "Crack" cocaine    Comment: smoked crack  . Sexual activity: Not on file    Comment: "when I drink"  Lifestyle  . Physical activity    Days per week: Not on file  Minutes per session: Not on file  . Stress: Not on file  Relationships  . Social Herbalist on phone: Not on file    Gets together: Not on file    Attends religious service: Not on file    Active member of club or organization: Not on file    Attends meetings of clubs or organizations: Not on file    Relationship status: Not on file  . Intimate partner violence    Fear of current or ex partner: Not on file    Emotionally abused: Not on file    Physically abused: Not on file    Forced sexual activity: Not on file  Other Topics Concern  . Not on file  Social History Narrative  . Not on file     No Known Allergies   Outpatient Medications Prior to Visit  Medication Sig Dispense Refill  . acyclovir (ZOVIRAX) 400 MG tablet Take 1 tablet (400 mg total) by mouth 2 (two) times daily. 60  tablet 6  . aspirin EC 81 MG tablet Take 81 mg by mouth daily.    Marland Kitchen glucosamine-chondroitin 500-400 MG tablet Take 1-2 tablets by mouth daily.    . hydrochlorothiazide (HYDRODIURIL) 25 MG tablet Take 1 tablet (25 mg total) by mouth daily. 30 tablet 2  . ibuprofen (ADVIL,MOTRIN) 800 MG tablet Take 800 mg by mouth every 6 (six) hours as needed for pain.  0  . Multiple Vitamin (MULTIVITAMIN) capsule Take 1 capsule by mouth daily.    . tamsulosin (FLOMAX) 0.4 MG CAPS capsule Take 1 capsule (0.4 mg total) by mouth daily. 30 capsule 6  . fluticasone (FLONASE) 50 MCG/ACT nasal spray Place 1 spray into both nostrils daily. 16 g 2  . amitriptyline (ELAVIL) 10 MG tablet Take 1 tablet (10 mg total) by mouth at bedtime. (Patient not taking: Reported on 12/29/2018) 30 tablet 1  . cetirizine (ZYRTEC) 10 MG tablet Take 1 tablet (10 mg total) by mouth daily. (Patient not taking: Reported on 12/29/2018) 30 tablet 3  . diclofenac sodium (VOLTAREN) 1 % GEL Apply 2 g topically 2 (two) times daily as needed. (Patient not taking: Reported on 12/18/2018) 100 g 4  . doxycycline (VIBRA-TABS) 100 MG tablet Take 1 tablet (100 mg total) by mouth 2 (two) times daily. (Patient not taking: Reported on 12/29/2018) 20 tablet 0  . gabapentin (NEURONTIN) 300 MG capsule Take 1 capsule (300 mg total) by mouth 2 (two) times daily. For foot pain (Patient not taking: Reported on 12/18/2018) 60 capsule 3  . hydrOXYzine (ATARAX/VISTARIL) 25 MG tablet Take 0.5-1 tablets (12.5-25 mg total) by mouth every 8 (eight) hours as needed for itching. (Patient not taking: Reported on 12/18/2018) 30 tablet 0  . meclizine (ANTIVERT) 25 MG tablet Take 1 tablet (25 mg total) by mouth 3 (three) times daily as needed for dizziness. (Patient not taking: Reported on 12/18/2018) 30 tablet 0  . omeprazole (PRILOSEC) 20 MG capsule TAKE 1 CAPSULE BY MOUTH DAILY AS NEEDED (HEART BURN). (Patient not taking: Reported on 12/18/2018) 30 capsule 2  . predniSONE (DELTASONE)  20 MG tablet 2 pills by mouth once daily for 5 days; take after eating (Patient not taking: Reported on 12/29/2018) 10 tablet 0  . predniSONE (STERAPRED UNI-PAK 21 TAB) 10 MG (21) TBPK tablet Take by mouth daily. Take as directed. (Patient not taking: Reported on 12/18/2018) 21 tablet 0  . tadalafil (CIALIS) 10 MG tablet Take 2 tablets daily as needed for erectile dysfunction. (  Patient not taking: Reported on 12/18/2018) 60 tablet 3  . triamcinolone cream (KENALOG) 0.1 % Apply 1 application topically 2 (two) times daily. (Patient not taking: Reported on 12/18/2018) 30 g 0  . Vitamin D, Ergocalciferol, (DRISDOL) 50000 units CAPS capsule Take 1 capsule (50,000 Units total) by mouth every 7 (seven) days. (Patient not taking: Reported on 12/18/2018) 16 capsule 0   No facility-administered medications prior to visit.    Review of Systems  HENT: Positive for congestion, dental problem, postnasal drip, rhinorrhea, sinus pressure and sneezing. Negative for ear discharge, ear pain, sinus pain, sore throat, tinnitus, trouble swallowing and voice change.   Respiratory: Positive for cough, shortness of breath and wheezing. Negative for choking and chest tightness.        Hears crackle in chest  Cardiovascular: Negative for chest pain and leg swelling.  Gastrointestinal:       GERD sxs   Genitourinary: Negative.   Musculoskeletal:       R knee pain   Neurological: Negative.   Hematological: Negative.   Psychiatric/Behavioral: Negative.        Objective:   Physical Exam Vitals:   12/29/18 1458  BP: 130/87  Pulse: 71  Temp: 98.3 F (36.8 C)  TempSrc: Oral  SpO2: 96%  Weight: 244 lb 9.6 oz (110.9 kg)  Height: 5\' 8"  (1.727 m)    Gen: Pleasant, well-nourished, in no distress,  normal affect  ENT: No lesions,  mouth clear,  oropharynx clear, no postnasal drip, Severe bilateral nasal turbinate edema  Neck: No JVD, no TMG, no carotid bruits  Lungs: No use of accessory muscles, no dullness to  percussion, no rales however there are expiratory wheezes in the right lower lung zones  Cardiovascular: RRR, heart sounds normal, no murmur or gallops, no peripheral edema  Abdomen: soft and NT, no HSM,  BS normal  Musculoskeletal: No deformities, no cyanosis or clubbing  Neuro: alert, non focal  Skin: Warm, no lesions or rashes   Cxr: NAD 12/18/18       Assessment & Plan:  I personally reviewed all images and lab data in the [facility] system as well as any outside material available during this office visit and agree with the  radiology impressions.   OSA (obstructive sleep apnea) History of obstructive sleep apnea now on BiPAP needing in new facemask and does not have insurance to pay for the new mask  We will have the patient go to the sleep lab for a desensitization procedure and see if he can obtain the mask for free  Other seasonal allergic rhinitis Significant seasonal allergic rhinitis with nasal turbinate edema  We will add Astelin 2 sprays each nostril twice daily, discontinue Zyrtec, increase Flonase to 2 sprays each nostril daily  GERD (gastroesophageal reflux disease) Severe reflux disease  We will change to Protonix 40 mg daily and discontinue omeprazole and give the patient a reflux diet to review  Chronic cough Chronic cough associated with episodes of bronchitis in an active smoker  Also history of perceived wheezes and crackles in breathing despite negative chest x-ray  Will obtain full pulmonary function testing and for this will need to do a Covid screening prior to the examination   Diagnoses and all orders for this visit:  OSA (obstructive sleep apnea) -     Desensitization mask fit; Future  Gastroesophageal reflux disease without esophagitis  Chronic cough -     Pulmonary Function Test; Future  Encounter for preoperative screening laboratory testing for COVID-19  virus -     Novel Coronavirus, NAA ([facility])  Other seasonal allergic rhinitis   Other orders -     pantoprazole (PROTONIX) 40 MG tablet; Take 1 tablet (40 mg total) by mouth daily. -     azelastine (ASTELIN) 0.1 % nasal spray; Place 2 sprays into both nostrils 2 (two) times daily. Use in each nostril as directed -     fluticasone (FLONASE) 50 MCG/ACT nasal spray; Place 2 sprays into both nostrils daily.

## 2019-01-05 ENCOUNTER — Other Ambulatory Visit: Payer: Self-pay | Admitting: Internal Medicine

## 2019-01-05 ENCOUNTER — Other Ambulatory Visit: Payer: Self-pay

## 2019-01-05 ENCOUNTER — Encounter: Payer: Self-pay | Admitting: Internal Medicine

## 2019-01-05 DIAGNOSIS — I10 Essential (primary) hypertension: Secondary | ICD-10-CM

## 2019-01-05 MED ORDER — HYDROCHLOROTHIAZIDE 25 MG PO TABS: 25.0000 mg | ORAL_TABLET | Freq: Every day | ORAL | 2 refills | Status: DC

## 2019-01-05 MED FILL — TAMSULOSIN HCL 0.4 MG CAP: 0.4 | 30 days supply | Qty: 30 | Fill #4

## 2019-01-05 MED FILL — ?HYDROCHLOROTHIAZIDE 25MG T: 25 | 30 days supply | Qty: 30 | Fill #0

## 2019-01-08 ENCOUNTER — Other Ambulatory Visit (HOSPITAL_COMMUNITY)
Admission: RE | Admit: 2019-01-08 | Discharge: 2019-01-08 | Disposition: A | Discharge status: Home / Self Care | Payer: Self-pay | Source: Ambulatory Visit | Attending: Critical Care Medicine | Admitting: Critical Care Medicine

## 2019-01-08 ENCOUNTER — Other Ambulatory Visit (HOSPITAL_COMMUNITY): Payer: Self-pay

## 2019-01-08 ENCOUNTER — Telehealth: Payer: Self-pay | Admitting: Internal Medicine

## 2019-01-08 DIAGNOSIS — Z01812 Encounter for preprocedural laboratory examination: Secondary | ICD-10-CM | POA: Insufficient documentation

## 2019-01-08 DIAGNOSIS — Z20828 Contact with and (suspected) exposure to other viral communicable diseases: Secondary | ICD-10-CM | POA: Insufficient documentation

## 2019-01-08 NOTE — Telephone Encounter (Signed)
Patient wanted to know when should he go for x ray

## 2019-01-09 LAB — SARS CORONAVIRUS 2 (TAT 6-24 HRS): SARS Coronavirus 2: NEGATIVE

## 2019-01-09 NOTE — Telephone Encounter (Signed)
I have not ordered an xray.   He has a lung function test that should be already scheduled

## 2019-01-10 ENCOUNTER — Encounter: Payer: Self-pay | Admitting: Critical Care Medicine

## 2019-01-11 ENCOUNTER — Other Ambulatory Visit: Payer: Self-pay

## 2019-01-11 ENCOUNTER — Ambulatory Visit (HOSPITAL_BASED_OUTPATIENT_CLINIC_OR_DEPARTMENT_OTHER): Payer: Self-pay | Attending: Critical Care Medicine | Admitting: Radiology

## 2019-01-11 DIAGNOSIS — G4733 Obstructive sleep apnea (adult) (pediatric): Secondary | ICD-10-CM

## 2019-01-12 NOTE — Telephone Encounter (Addendum)
Spoke to patient regarding appointment for PFT. He will need to have it scheduled on a Friday so that it doesn't interfere with work but can only have Covid testing 3 days before. Soonest availability they have is December 18th @10 . Unsure of COVID testing in December with [facility] that he can get to 3 days prior to his appt. Will look into the appt.   Will call back to inform patient of appointment for he has certain time he ca talk at work. 12:30 and 2:30

## 2019-01-15 MED FILL — ?ACYCLOVIR 400MG TABS: 400 | 30 days supply | Qty: 60 | Fill #4

## 2019-01-18 ENCOUNTER — Other Ambulatory Visit (HOSPITAL_BASED_OUTPATIENT_CLINIC_OR_DEPARTMENT_OTHER): Payer: Self-pay | Admitting: Internal Medicine

## 2019-01-26 ENCOUNTER — Encounter (HOSPITAL_COMMUNITY): Payer: Self-pay

## 2019-02-01 MED FILL — ?HYDROCHLOROTHIAZIDE 25MG T: 25 | 30 days supply | Qty: 30 | Fill #1

## 2019-02-01 MED FILL — TAMSULOSIN HCL 0.4 MG CAP: 0.4 | 30 days supply | Qty: 30 | Fill #5

## 2019-02-09 NOTE — Progress Notes (Signed)
Subjective:    Patient ID: Samuel Irwin, male    DOB: [birth year], 58 y.o.   MRN: [id number] Virtual Visit via Telephone Note  I connected with Samuel Irwin on 02/10/19 at 10:00 AM EST by telephone and verified that I am speaking with the correct person using two identifiers.   Consent:  I discussed the limitations, risks, security and privacy concerns of performing an evaluation and management service by telephone and the availability of in person appointments. I also discussed with the patient that there may be a patient responsible charge related to this service. The patient expressed understanding and agreed to proceed.  Location of patient: The patient was at home  Location of provider: I was in the office  Persons participating in the televisit with the patient.   No one else on the call    History of Present Illness:   58 y.o.M PCP patient of DR [name] here for 2 week f/u for Rx of Bronchitis  This patient relates a history of hearing the sensation of crackling in the chest and lungs particularly when laying flat and associated with this has had a mild cough.  Patient also notes significant nasal congestion and postnasal drainage with this.  He has been an active smoker but quit smoking a week ago.  Patient also uses BiPAP and wears a mask that he feels is not tolerating well and would like to have a new mask obtained.  The patient also complains of significant heartburn worse at night and wakes him up from sleep.  He has been using omeprazole as needed.  Patient has a history of the hypertension and diagnosed reflux disease previously.  He has allergic rhinitis as well.  A chest x-ray had been obtained and was unremarkable.  Please review cough assessment below.  12/16  This is a follow-up telephone visit from a visit several weeks ago.  The patient still is noting crackling in the chest when he lays flat takes a deep breath he does have a pulmonary function study  pending.  He does now have a properly fitting bilevel mask and is sleeping better with this.  He still has some difficulty with his right knee with some right knee pain worse going upstairs.  Also gets short of breath going up stairs.  He still smoking 2 to 3 cigarettes on the weekends.   Cough This is a new problem. The current episode started more than 1 month ago. The problem has been resolved. Episode frequency: worse lay flat, cough is intermittent. The cough is non-productive. Pertinent negatives include no chest pain, ear pain, postnasal drip, rhinorrhea, sore throat, shortness of breath or wheezing. Associated symptoms comments: Dyspnea is minimal. Crackle sound if breath at night that has not changed. . The symptoms are aggravated by lying down. Risk factors for lung disease include smoking/tobacco exposure (quit smoking last week). His past medical history is significant for bronchitis. There is no history of pneumonia. OSA on cpap   Past Medical History:  Diagnosis Date  . Constipation   . Cough   . Generalized headaches   . GERD (gastroesophageal reflux disease)   . H/O hiatal hernia   . Heart murmur   . Herpes   . Hypertension   . Sleep apnea    uses BIPAP  . Umbilical hernia   . Weakness   . Wheezing      Family History  Problem Relation Age of Onset  . Hypertension Mother   .  Cancer Father        lung  . Colon cancer Neg Hx      Social History   Socioeconomic History  . Marital status: Single    Spouse name: Not on file  . Number of children: Not on file  . Years of education: Not on file  . Highest education level: Not on file  Occupational History  . Occupation: unemployed  Tobacco Use  . Smoking status: Current Some Day Smoker    Packs/day: 0.25    Years: 43.00    Pack years: 10.75    Types: Cigarettes  . Smokeless tobacco: Never Used  . Tobacco comment: occas still smokes when he drinks  Substance and Sexual Activity  . Alcohol use: Yes     Comment: once weekly  . Drug use: Not Currently    Types: Cocaine, "Crack" cocaine    Comment: smoked crack  . Sexual activity: Not on file    Comment: "when I drink"  Other Topics Concern  . Not on file  Social History Narrative  . Not on file   Social Determinants of Health   Financial Resource Strain:   . Difficulty of Paying Living Expenses: Not on file  Food Insecurity:   . Worried About Charity fundraiser in the Last Year: Not on file  . Ran Out of Food in the Last Year: Not on file  Transportation Needs:   . Lack of Transportation (Medical): Not on file  . Lack of Transportation (Non-Medical): Not on file  Physical Activity:   . Days of Exercise per Week: Not on file  . Minutes of Exercise per Session: Not on file  Stress:   . Feeling of Stress : Not on file  Social Connections:   . Frequency of Communication with Friends and Family: Not on file  . Frequency of Social Gatherings with Friends and Family: Not on file  . Attends Religious Services: Not on file  . Active Member of Clubs or Organizations: Not on file  . Attends Archivist Meetings: Not on file  . Marital Status: Not on file  Intimate Partner Violence:   . Fear of Current or Ex-Partner: Not on file  . Emotionally Abused: Not on file  . Physically Abused: Not on file  . Sexually Abused: Not on file     No Known Allergies   Outpatient Medications Prior to Visit  Medication Sig Dispense Refill  . acyclovir (ZOVIRAX) 400 MG tablet Take 1 tablet (400 mg total) by mouth 2 (two) times daily. 60 tablet 6  . aspirin EC 81 MG tablet Take 81 mg by mouth daily.    Marland Kitchen azelastine (ASTELIN) 0.1 % nasal spray Place 2 sprays into both nostrils 2 (two) times daily. Use in each nostril as directed 30 mL 12  . fluticasone (FLONASE) 50 MCG/ACT nasal spray Place 2 sprays into both nostrils daily. 16 g 2  . glucosamine-chondroitin 500-400 MG tablet Take 1-2 tablets by mouth daily.    . hydrochlorothiazide  (HYDRODIURIL) 25 MG tablet Take 1 tablet (25 mg total) by mouth daily. 30 tablet 2  . ibuprofen (ADVIL,MOTRIN) 800 MG tablet Take 800 mg by mouth every 6 (six) hours as needed for pain.  0  . Multiple Vitamin (MULTIVITAMIN) capsule Take 1 capsule by mouth daily.    . pantoprazole (PROTONIX) 40 MG tablet Take 1 tablet (40 mg total) by mouth daily. 30 tablet 3  . tamsulosin (FLOMAX) 0.4 MG CAPS capsule Take 1  capsule (0.4 mg total) by mouth daily. 30 capsule 6   No facility-administered medications prior to visit.   Review of Systems  HENT: Positive for dental problem. Negative for congestion, ear discharge, ear pain, postnasal drip, rhinorrhea, sinus pressure, sinus pain, sneezing, sore throat, tinnitus, trouble swallowing and voice change.   Respiratory: Negative for cough, choking, chest tightness, shortness of breath and wheezing.        Hears crackle in chest  Cardiovascular: Negative for chest pain and leg swelling.  Gastrointestinal:       GERD sxs   Genitourinary: Negative.   Musculoskeletal:       R knee pain   Neurological: Negative.   Hematological: Negative.   Psychiatric/Behavioral: Negative.        Objective:   Physical Exam There were no vitals filed for this visit.   This is a telephone visit no observations made Cxr: NAD 12/18/18       Assessment & Plan:    I personally reviewed all images and lab data in the [facility] system as well as any outside material available during this office visit and agree with the  radiology impressions.   Tobacco use    . Current smoking consumption amount: 2-3 cig on weekend  . Dicsussion on advise to quit smoking and smoking impacts: health effects on lungs and CV health  . Patient's willingness to quit:  Is committed  . Methods to quit smoking discussed:  behav modification/ nicotine replacement  . Medication management of smoking session drugs discussed: use 2mg  nicotine lozenge. prn  . Resources provided:  AVS    . Setting quit date  2-3 weeks  . Follow-up arranged early january   Time spent counseling the patient:  8 min     Osteoarthritis of right knee Progressive arthritis now in the right knee previous imaging on the left knee shows osteoarthritis  I went over with the patient some knee exercises to improve his functionality and gave him a short-term prescription for meloxicam we may yet need orthopedic referral  OSA (obstructive sleep apnea) Sleep apnea is markedly improved with new mask  Chronic cough Chronic cough appears to resolved however patient is having some dyspnea and still smoking  Review smoking assessment  Follow-up pulmonary function studies at this time and hold off on any additional medications     Diagnoses and all orders for this visit:  Tobacco use  Post-traumatic osteoarthritis of right knee  OSA (obstructive sleep apnea)  Chronic cough  Other orders -     meloxicam (MOBIC) 15 MG tablet; Take 1 tablet (15 mg total) by mouth daily. -     nicotine polacrilex (NICORETTE MINI) 2 MG lozenge; Use as needed to quit smoking     Follow Up Instructions: The patient knows an office exam will be scheduled in early January we will follow-up on the pulmonary function studies   I discussed the assessment and treatment plan with the patient. The patient was provided an opportunity to ask questions and all were answered. The patient agreed with the plan and demonstrated an understanding of the instructions.   The patient was advised to call back or seek an in-person evaluation if the symptoms worsen or if the condition fails to improve as anticipated.  I provided 30 minutes of non-face-to-face time during this encounter  including  median intraservice time , review of notes, labs, imaging, medications  and explaining diagnosis and management to the patient .    Asencion Noble, MD

## 2019-02-10 ENCOUNTER — Ambulatory Visit: Payer: Self-pay | Attending: Critical Care Medicine | Admitting: Critical Care Medicine

## 2019-02-10 ENCOUNTER — Encounter: Payer: Self-pay | Admitting: Critical Care Medicine

## 2019-02-10 ENCOUNTER — Other Ambulatory Visit: Payer: Self-pay

## 2019-02-10 DIAGNOSIS — G4733 Obstructive sleep apnea (adult) (pediatric): Secondary | ICD-10-CM

## 2019-02-10 DIAGNOSIS — M1731 Unilateral post-traumatic osteoarthritis, right knee: Secondary | ICD-10-CM

## 2019-02-10 DIAGNOSIS — Z720 Tobacco use: Secondary | ICD-10-CM

## 2019-02-10 DIAGNOSIS — R053 Chronic cough: Secondary | ICD-10-CM

## 2019-02-10 DIAGNOSIS — F17210 Nicotine dependence, cigarettes, uncomplicated: Secondary | ICD-10-CM

## 2019-02-10 DIAGNOSIS — R05 Cough: Secondary | ICD-10-CM

## 2019-02-10 MED ORDER — MELOXICAM 15 MG PO TABS: 15.0000 mg | ORAL_TABLET | Freq: Every day | ORAL | 0 refills | Status: DC

## 2019-02-10 MED ORDER — NICOTINE POLACRILEX 2 MG MT LOZG: LOZENGE | OROMUCOSAL | 2 refills | Status: DC

## 2019-02-10 MED FILL — NICOTINE 2 MG LOZENGE: 2 | 72 days supply | Qty: 72 | Fill #0

## 2019-02-10 MED FILL — MELOXICAM 15 MG TABLET: 15 | 30 days supply | Qty: 30 | Fill #0

## 2019-02-10 NOTE — Assessment & Plan Note (Signed)
Progressive arthritis now in the right knee previous imaging on the left knee shows osteoarthritis  I went over with the patient some knee exercises to improve his functionality and gave him a short-term prescription for meloxicam we may yet need orthopedic referral

## 2019-02-10 NOTE — Assessment & Plan Note (Signed)
  .   Current smoking consumption amount: 2-3 cig on weekend  . Dicsussion on advise to quit smoking and smoking impacts: health effects on lungs and CV health  . Patient's willingness to quit:  Is committed  . Methods to quit smoking discussed:  behav modification/ nicotine replacement  . Medication management of smoking session drugs discussed: use 2mg  nicotine lozenge. prn  . Resources provided:  AVS   . Setting quit date  2-3 weeks  . Follow-up arranged early january   Time spent counseling the patient:  8 min

## 2019-02-10 NOTE — Assessment & Plan Note (Signed)
Sleep apnea is markedly improved with new mask

## 2019-02-10 NOTE — Assessment & Plan Note (Signed)
Chronic cough appears to resolved however patient is having some dyspnea and still smoking  Review smoking assessment  Follow-up pulmonary function studies at this time and hold off on any additional medications

## 2019-02-12 ENCOUNTER — Encounter (HOSPITAL_COMMUNITY): Payer: Self-pay

## 2019-02-15 MED FILL — ?ACYCLOVIR 400MG TABS: 400 | 30 days supply | Qty: 60 | Fill #5

## 2019-02-23 ENCOUNTER — Other Ambulatory Visit: Payer: Self-pay

## 2019-02-23 ENCOUNTER — Ambulatory Visit (HOSPITAL_COMMUNITY)
Admission: EM | Admit: 2019-02-23 | Discharge: 2019-02-23 | Disposition: A | Discharge status: Home / Self Care | Payer: Self-pay | Attending: Family Medicine | Admitting: Family Medicine

## 2019-02-23 ENCOUNTER — Encounter (HOSPITAL_COMMUNITY): Payer: Self-pay | Admitting: Emergency Medicine

## 2019-02-23 DIAGNOSIS — K0889 Other specified disorders of teeth and supporting structures: Secondary | ICD-10-CM

## 2019-02-23 MED ORDER — TRAMADOL HCL 50 MG PO TABS: 50.0000 mg | ORAL_TABLET | Freq: Four times a day (QID) | ORAL | 0 refills | Status: DC | PRN

## 2019-02-23 MED ORDER — IBUPROFEN 800 MG PO TABS: 800.0000 mg | ORAL_TABLET | Freq: Three times a day (TID) | ORAL | 0 refills | Status: DC

## 2019-02-23 MED ORDER — PENICILLIN V POTASSIUM 500 MG PO TABS: 500.0000 mg | ORAL_TABLET | Freq: Three times a day (TID) | ORAL | 0 refills | Status: DC

## 2019-02-23 NOTE — ED Provider Notes (Signed)
[place]   [id number] 02/23/19 Arrival Time: [id number]  ASSESSMENT & PLAN:  1. Pain, dental     No sign of abscess requiring I&D at this time. Discussed.  Meds ordered this encounter  Medications  . penicillin v potassium (VEETID) 500 MG tablet    Sig: Take 1 tablet (500 mg total) by mouth 3 (three) times daily.    Dispense:  30 tablet    Refill:  0  . traMADol (ULTRAM) 50 MG tablet    Sig: Take 1 tablet (50 mg total) by mouth every 6 (six) hours as needed.    Dispense:  12 tablet    Refill:  0  . ibuprofen (ADVIL) 800 MG tablet    Sig: Take 1 tablet (800 mg total) by mouth 3 (three) times daily with meals.    Dispense:  21 tablet    Refill:  0    [place] Controlled Substances Registry consulted for this patient. I feel the risk/benefit ratio today is favorable for proceeding with this prescription for a controlled substance. Medication sedation precautions given.  Dental resource written instructions given. He will schedule dental evaluation as soon as possible if not improving over the next 24-48 hours.  Reviewed expectations re: course of current medical issues. Questions answered. Outlined signs and symptoms indicating need for more acute intervention. Patient verbalized understanding. After Visit Summary given.   SUBJECTIVE:  Samuel Irwin is a 58 y.o. male who reports gradual onset of left upper dental pain described as aching; "hurts to even open my mouth". Present for a few days. Fever: absent. Pain with chewing. Normal swallowing. He does not see a dentist regularly. No neck swelling or pain. OTC analgesics without relief; ibuprofen. Is tolerating PO fluids. No respiratory symptoms or swallowing difficulty reported. No neck pain or swelling.  ROS: As per HPI. All other systems negative.   OBJECTIVE: Vitals:   02/23/19 1740  BP: 132/74  Pulse: 82  Resp: 16  Temp: 98.8 F (37.1 C)  TempSrc: Oral  SpO2: 97%    General appearance: alert; no  distress HENT: normocephalic; atraumatic; dentition: fair; left upper rear gums without areas of fluctuance, drainage, or bleeding and with tenderness to palpation; normal jaw movement without difficulty Neck: supple without LAD; FROM; trachea midline CV: regular Lungs: normal respirations; unlabored; speaks full sentences without difficulty Skin: warm and dry Ext: no edema Neuro: normal gait Psychological: alert and cooperative; normal mood and affect  No Known Allergies  Past Medical History:  Diagnosis Date  . Constipation   . Cough   . Generalized headaches   . GERD (gastroesophageal reflux disease)   . H/O hiatal hernia   . Heart murmur   . Herpes   . Hypertension   . Sleep apnea    uses BIPAP  . Umbilical hernia   . Weakness   . Wheezing    Social History   Socioeconomic History  . Marital status: Single    Spouse name: Not on file  . Number of children: Not on file  . Years of education: Not on file  . Highest education level: Not on file  Occupational History  . Occupation: unemployed  Tobacco Use  . Smoking status: Current Some Day Smoker    Packs/day: 0.25    Years: 43.00    Pack years: 10.75    Types: Cigarettes  . Smokeless tobacco: Never Used  . Tobacco comment: occas still smokes when he drinks  Substance and Sexual Activity  . Alcohol  use: Yes    Comment: once weekly  . Drug use: Not Currently    Types: Cocaine, "Crack" cocaine    Comment: smoked crack  . Sexual activity: Not on file    Comment: "when I drink"  Other Topics Concern  . Not on file  Social History Narrative  . Not on file   Social Determinants of Health   Financial Resource Strain:   . Difficulty of Paying Living Expenses: Not on file  Food Insecurity:   . Worried About Charity fundraiser in the Last Year: Not on file  . Ran Out of Food in the Last Year: Not on file  Transportation Needs:   . Lack of Transportation (Medical): Not on file  . Lack of Transportation  (Non-Medical): Not on file  Physical Activity:   . Days of Exercise per Week: Not on file  . Minutes of Exercise per Session: Not on file  Stress:   . Feeling of Stress : Not on file  Social Connections:   . Frequency of Communication with Friends and Family: Not on file  . Frequency of Social Gatherings with Friends and Family: Not on file  . Attends Religious Services: Not on file  . Active Member of Clubs or Organizations: Not on file  . Attends Archivist Meetings: Not on file  . Marital Status: Not on file  Intimate Partner Violence:   . Fear of Current or Ex-Partner: Not on file  . Emotionally Abused: Not on file  . Physically Abused: Not on file  . Sexually Abused: Not on file   Family History  Problem Relation Age of Onset  . Hypertension Mother   . Cancer Father        lung  . Colon cancer Neg Hx    Past Surgical History:  Procedure Laterality Date  . BIOPSY  10/18/2014   Procedure: BIOPSY (Gastric);  Surgeon: Danie Binder, MD;  Location: [facility];  Service: Endoscopy;;  . CARPAL TUNNEL RELEASE     R hand  . COLONOSCOPY WITH PROPOFOL N/A 10/18/2014   [id number] size external hemorrhoids/left colon is redundant  . ESOPHAGOGASTRODUODENOSCOPY (EGD) WITH PROPOFOL N/A 10/18/2014   SLF: epigastric pain due to moderate NSAID gastritis  . FEMUR SURGERY     left  . HERNIA REPAIR  04/07/12   umb hernia repair  . INSERTION OF MESH N/A 04/07/2012   Procedure: INSERTION OF MESH;  Surgeon: Madilyn Hook, DO;  Location: [place];  Service: General;  Laterality: N/A;  . KNEE ARTHROSCOPY     right  . MINOR CARPAL TUNNEL Left 01/31/2016   Procedure: MINOR CARPAL TUNNEL RELEASE LEFT;  Surgeon: Charlotte Crumb, MD;  Location: [place];  Service: Orthopedics;  Laterality: Left;  Local  . TOE SURGERY     dislocated lt foot  . UMBILICAL HERNIA REPAIR N/A 04/07/2012   Procedure: HERNIA REPAIR UMBILICAL ADULT;  Surgeon: Madilyn Hook, DO;  Location: [place];  Service:  General;  Laterality: N/A;  open umbilical hernia repair with mesh     Vanessa Kick, MD 02/23/19 1849

## 2019-02-23 NOTE — Discharge Instructions (Signed)
Be aware, pain medications may cause drowsiness. Please do not drive, operate heavy machinery or make important decisions while on this medication, it can cloud your judgement.  

## 2019-02-23 NOTE — ED Triage Notes (Signed)
Pt reports left sided jaw pain. No injury.   Got a tooth pulled over the summer. This was the last time he had dental work.

## 2019-02-24 MED FILL — ?IBUPROFEN 800 MG TABS: AMNEAL | 7 days supply | Qty: 21 | Fill #0

## 2019-02-24 MED FILL — PENICILLIN VK 500 MG TABLET: 500 | 10 days supply | Qty: 30 | Fill #0

## 2019-03-02 ENCOUNTER — Ambulatory Visit: Payer: Self-pay | Admitting: Critical Care Medicine

## 2019-03-02 ENCOUNTER — Telehealth: Payer: Self-pay | Admitting: Internal Medicine

## 2019-03-02 NOTE — Telephone Encounter (Signed)
Pt called to request a sleep apnea test scheduled, please follow up

## 2019-03-02 NOTE — Telephone Encounter (Signed)
Contacted pt and lvm see if he has discussed a sleep apnea with provider or is this something he is wanting done. Asked pt to give me a call at his earliest convenience

## 2019-03-04 MED FILL — ?HYDROCHLOROTHIAZIDE 25MG T: 25 | 30 days supply | Qty: 30 | Fill #2

## 2019-03-04 MED FILL — TAMSULOSIN HCL 0.4 MG CAP: 0.4 | 30 days supply | Qty: 30 | Fill #6

## 2019-03-08 ENCOUNTER — Encounter (HOSPITAL_COMMUNITY): Payer: Self-pay

## 2019-03-16 MED FILL — ?ACYCLOVIR 400MG TABS: 400 | 30 days supply | Qty: 60 | Fill #6

## 2019-03-22 ENCOUNTER — Telehealth: Payer: Self-pay | Admitting: *Deleted

## 2019-03-22 ENCOUNTER — Encounter (HOSPITAL_COMMUNITY): Payer: Self-pay

## 2019-03-22 NOTE — Telephone Encounter (Signed)
-----   Message from Elsie Stain, MD sent at 03/19/2019  3:24 PM EST ----- This patient had a PFT scheduled for Monday,  two things:  I think they cancelled all pfts for now due to covid,  and second he would need an updated Covid test.   Can you find out what the situation is?

## 2019-03-22 NOTE — Telephone Encounter (Signed)
Late entry- Spoke to patient Friday at 1650.  Patient aware that scheduling for PFT's has been postponed due to COVID-19 by [facility].  Patient verbalized understanding.   Informed that once the testing resumed he would need to have COVID testing 3 days prior to appt.  Patient verbalized understanding.

## 2019-03-31 ENCOUNTER — Other Ambulatory Visit: Payer: Self-pay | Admitting: Critical Care Medicine

## 2019-03-31 DIAGNOSIS — I10 Essential (primary) hypertension: Secondary | ICD-10-CM

## 2019-03-31 DIAGNOSIS — N400 Enlarged prostate without lower urinary tract symptoms: Secondary | ICD-10-CM

## 2019-03-31 MED ORDER — TAMSULOSIN HCL 0.4 MG PO CAPS: 0.4000 mg | ORAL_CAPSULE | Freq: Every day | ORAL | 6 refills | Status: DC

## 2019-03-31 MED ORDER — HYDROCHLOROTHIAZIDE 25 MG PO TABS: 25.0000 mg | ORAL_TABLET | Freq: Every day | ORAL | 2 refills | Status: DC

## 2019-03-31 MED FILL — TAMSULOSIN HCL 0.4 MG CAP: 0.4 | 30 days supply | Qty: 30 | Fill #1

## 2019-03-31 MED FILL — ?HYDROCHLOROTHIAZIDE 25MG T: 25 | 30 days supply | Qty: 30 | Fill #0

## 2019-03-31 MED FILL — TAMSULOSIN HCL 0.4 MG CAP: 0.4 | 30 days supply | Qty: 30 | Fill #0

## 2019-03-31 MED FILL — ?HYDROCHLOROTHIAZIDE 25MG T: 25 | 30 days supply | Qty: 30 | Fill #1

## 2019-03-31 NOTE — Progress Notes (Signed)
meds refilled 

## 2019-04-14 ENCOUNTER — Other Ambulatory Visit: Payer: Self-pay | Admitting: Internal Medicine

## 2019-04-14 MED FILL — ?ACYCLOVIR 400MG TABS: 400 | 30 days supply | Qty: 60 | Fill #0

## 2019-04-30 MED FILL — TAMSULOSIN HCL 0.4 MG CAP: 0.4 | 30 days supply | Qty: 30 | Fill #2

## 2019-04-30 MED FILL — ?HYDROCHLOROTHIAZIDE 25MG T: 25 | 30 days supply | Qty: 30 | Fill #2

## 2019-05-12 ENCOUNTER — Telehealth: Payer: Self-pay | Admitting: Internal Medicine

## 2019-05-12 NOTE — Telephone Encounter (Signed)
Called pt lvm no answer

## 2019-05-12 NOTE — Telephone Encounter (Signed)
-----   Message from Elsie Stain, MD sent at 05/12/2019 11:07 AM EDT ----- Has acute back pain, if no one can see him, send him to mobile unit ----- Message ----- From: Dorise Hiss Sent: 05/12/2019   9:12 AM EDT To: Elsie Stain, MD  Hey dr Joya Gaskins what is it that he needs to be seen for? We only have [place] and [place]. Dr. Margarita Rana cant because she has a meeting and Thereasa Solo is saying she doesn't know yet. She also wants to know what it is he needs to bee seen for

## 2019-05-13 MED FILL — ACYCLOVIR 400 MG TABLET: 400 | 30 days supply | Qty: 60 | Fill #1

## 2019-05-14 MED ORDER — AMOXICILLIN 500 MG PO CAPS: 500.0000 mg | ORAL_CAPSULE | Freq: Three times a day (TID) | ORAL | 0 refills | Status: DC

## 2019-05-14 MED FILL — AMOXICILLIN 500 MG CAPSULE: 500 | 7 days supply | Qty: 21 | Fill #0

## 2019-06-01 ENCOUNTER — Ambulatory Visit: Payer: Self-pay | Admitting: Critical Care Medicine

## 2019-06-02 ENCOUNTER — Other Ambulatory Visit: Payer: Self-pay | Admitting: Critical Care Medicine

## 2019-06-02 DIAGNOSIS — I10 Essential (primary) hypertension: Secondary | ICD-10-CM

## 2019-06-02 MED FILL — TAMSULOSIN HCL 0.4 MG CAP: 0.4 | 30 days supply | Qty: 30 | Fill #3

## 2019-06-03 MED FILL — HYDROCHLOROTHIAZIDE 25 MG T: 25 | 30 days supply | Qty: 30 | Fill #0

## 2019-06-11 MED FILL — ACYCLOVIR 400 MG TABLET: 400 | 30 days supply | Qty: 60 | Fill #2

## 2019-06-22 MED FILL — ?CEPHALEXIN 500MG CAPSULE: 500 | 7 days supply | Qty: 28 | Fill #0

## 2019-07-02 ENCOUNTER — Other Ambulatory Visit: Payer: Self-pay | Admitting: Internal Medicine

## 2019-07-02 DIAGNOSIS — I10 Essential (primary) hypertension: Secondary | ICD-10-CM

## 2019-07-02 MED FILL — TAMSULOSIN HCL 0.4 MG CAP: 0.4 | 30 days supply | Qty: 30 | Fill #4

## 2019-07-02 MED FILL — HYDROCHLOROTHIAZIDE 25 MG T: 25 | 30 days supply | Qty: 30 | Fill #0

## 2019-07-02 NOTE — Telephone Encounter (Signed)
Pt last seen in Dec, 2020. Was instructed to make OV with last refill and did not. Will forward to Dr. Joya Gaskins.

## 2019-07-23 MED FILL — AMOXICILLIN 500 MG CAPSULE: 500 | 7 days supply | Qty: 21 | Fill #0

## 2019-07-29 ENCOUNTER — Other Ambulatory Visit: Payer: Self-pay | Admitting: Critical Care Medicine

## 2019-07-29 DIAGNOSIS — I10 Essential (primary) hypertension: Secondary | ICD-10-CM

## 2019-07-29 MED ORDER — HYDROCHLOROTHIAZIDE 25 MG PO TABS: 25.0000 mg | ORAL_TABLET | Freq: Every day | ORAL | 0 refills | Status: DC

## 2019-07-30 MED FILL — HYDROCHLOROTHIAZIDE 25 MG T: 25 | 30 days supply | Qty: 30 | Fill #0

## 2019-09-01 ENCOUNTER — Other Ambulatory Visit: Payer: Self-pay | Admitting: Critical Care Medicine

## 2019-09-01 DIAGNOSIS — I10 Essential (primary) hypertension: Secondary | ICD-10-CM

## 2019-09-01 MED ORDER — HYDROCHLOROTHIAZIDE 25 MG PO TABS: 25.0000 mg | ORAL_TABLET | Freq: Every day | ORAL | 0 refills | Status: DC

## 2019-09-01 MED FILL — ?ACYCLOVIR 400 MG TABL: 400 | 30 days supply | Qty: 60 | Fill #5

## 2019-09-01 MED FILL — HYDROCHLOROTHIAZIDE 25 MG T: 25 | 30 days supply | Qty: 30 | Fill #0

## 2019-09-03 MED FILL — TAMSULOSIN HCL 0.4 MG CAP: 0.4 | 30 days supply | Qty: 30 | Fill #6

## 2019-09-08 ENCOUNTER — Other Ambulatory Visit: Payer: Self-pay | Admitting: Critical Care Medicine

## 2019-09-08 ENCOUNTER — Other Ambulatory Visit: Payer: Self-pay | Admitting: Family Medicine

## 2019-09-08 DIAGNOSIS — I10 Essential (primary) hypertension: Secondary | ICD-10-CM

## 2019-09-08 MED ORDER — MECLIZINE HCL 25 MG PO TABS: 25.0000 mg | ORAL_TABLET | Freq: Three times a day (TID) | ORAL | 0 refills | Status: DC | PRN

## 2019-09-09 MED FILL — MECLIZINE 25 MG TABLET: 25 | 30 days supply | Qty: 30 | Fill #0

## 2019-09-20 ENCOUNTER — Ambulatory Visit: Payer: Managed Care, Other (non HMO) | Attending: Internal Medicine | Admitting: Internal Medicine

## 2019-09-20 ENCOUNTER — Other Ambulatory Visit: Payer: Self-pay

## 2019-09-20 ENCOUNTER — Encounter: Payer: Self-pay | Admitting: Internal Medicine

## 2019-09-20 VITALS — BP 122/82 | HR 88 | Resp 16 | Ht 68.0 in | Wt 244.4 lb

## 2019-09-20 DIAGNOSIS — F22 Delusional disorders: Secondary | ICD-10-CM | POA: Insufficient documentation

## 2019-09-20 DIAGNOSIS — Z7982 Long term (current) use of aspirin: Secondary | ICD-10-CM | POA: Insufficient documentation

## 2019-09-20 DIAGNOSIS — I10 Essential (primary) hypertension: Secondary | ICD-10-CM | POA: Diagnosis not present

## 2019-09-20 DIAGNOSIS — E785 Hyperlipidemia, unspecified: Secondary | ICD-10-CM | POA: Diagnosis not present

## 2019-09-20 DIAGNOSIS — K219 Gastro-esophageal reflux disease without esophagitis: Secondary | ICD-10-CM | POA: Diagnosis not present

## 2019-09-20 DIAGNOSIS — Z791 Long term (current) use of non-steroidal anti-inflammatories (NSAID): Secondary | ICD-10-CM | POA: Insufficient documentation

## 2019-09-20 DIAGNOSIS — Z6837 Body mass index (BMI) 37.0-37.9, adult: Secondary | ICD-10-CM

## 2019-09-20 DIAGNOSIS — M1711 Unilateral primary osteoarthritis, right knee: Secondary | ICD-10-CM | POA: Diagnosis not present

## 2019-09-20 DIAGNOSIS — Z6839 Body mass index (BMI) 39.0-39.9, adult: Secondary | ICD-10-CM | POA: Insufficient documentation

## 2019-09-20 DIAGNOSIS — G8929 Other chronic pain: Secondary | ICD-10-CM

## 2019-09-20 DIAGNOSIS — R208 Other disturbances of skin sensation: Secondary | ICD-10-CM | POA: Diagnosis not present

## 2019-09-20 DIAGNOSIS — Z720 Tobacco use: Secondary | ICD-10-CM | POA: Diagnosis not present

## 2019-09-20 DIAGNOSIS — Z79899 Other long term (current) drug therapy: Secondary | ICD-10-CM | POA: Insufficient documentation

## 2019-09-20 DIAGNOSIS — Z125 Encounter for screening for malignant neoplasm of prostate: Secondary | ICD-10-CM | POA: Insufficient documentation

## 2019-09-20 DIAGNOSIS — F17210 Nicotine dependence, cigarettes, uncomplicated: Secondary | ICD-10-CM | POA: Diagnosis not present

## 2019-09-20 DIAGNOSIS — E559 Vitamin D deficiency, unspecified: Secondary | ICD-10-CM | POA: Insufficient documentation

## 2019-09-20 DIAGNOSIS — Z8249 Family history of ischemic heart disease and other diseases of the circulatory system: Secondary | ICD-10-CM | POA: Insufficient documentation

## 2019-09-20 DIAGNOSIS — M25561 Pain in right knee: Secondary | ICD-10-CM

## 2019-09-20 DIAGNOSIS — R079 Chest pain, unspecified: Secondary | ICD-10-CM | POA: Insufficient documentation

## 2019-09-20 NOTE — Patient Instructions (Signed)
Purchase and use some Voltaren gel on the right knee.   Obesity, Adult Obesity is having too much body fat. Being obese means that your weight is more than what is healthy for you. BMI is a number that explains how much body fat you have. If you have a BMI of 30 or more, you are obese. Obesity is often caused by eating or drinking more calories than your body uses. Changing your lifestyle can help you lose weight. Obesity can cause serious health problems, such as:  Stroke.  Coronary artery disease (CAD).  Type 2 diabetes.  Some types of cancer, including cancers of the colon, breast, uterus, and gallbladder.  Osteoarthritis.  High blood pressure (hypertension).  High cholesterol.  Sleep apnea.  Gallbladder stones.  Infertility problems. What are the causes?  Eating meals each day that are high in calories, sugar, and fat.  Being born with genes that may make you more likely to become obese.  Having a medical condition that causes obesity.  Taking certain medicines.  Sitting a lot (having a sedentary lifestyle).  Not getting enough sleep.  Drinking a lot of drinks that have sugar in them. What increases the risk?  Having a family history of obesity.  Being an [place] American woman.  Being a Hispanic man.  Living in an area with limited access to: ? [place]ilda Garret, recreation centers, or sidewalks. ? Healthy food choices, such as grocery stores and farmers' markets. What are the signs or symptoms? The main sign is having too much body fat. How is this treated?  Treatment for this condition often includes changing your lifestyle. Treatment may include: ? Changing your diet. This may include making a healthy meal plan. ? Exercise. This may include activity that causes your heart to beat faster (aerobic exercise) and strength training. Work with your doctor to design a program that works for you. ? Medicine to help you lose weight. This may be used if you are not able  to lose 1 pound a week after 6 weeks of healthy eating and more exercise. ? Treating conditions that cause the obesity. ? Surgery. Options may include gastric banding and gastric bypass. This may be done if:  Other treatments have not helped to improve your condition.  You have a BMI of 40 or higher.  You have life-threatening health problems related to obesity. Follow these instructions at home: Eating and drinking   Follow advice from your doctor about what to eat and drink. Your doctor may tell you to: ? Limit fast food, sweets, and processed snack foods. ? Choose low-fat options. For example, choose low-fat milk instead of whole milk. ? Eat 5 or more servings of fruits or vegetables each day. ? Eat at home more often. This gives you more control over what you eat. ? Choose healthy foods when you eat out. ? Learn to read food labels. This will help you learn how much food is in 1 serving. ? Keep low-fat snacks available. ? Avoid drinks that have a lot of sugar in them. These include soda, fruit juice, iced tea with sugar, and flavored milk.  Drink enough water to keep your pee (urine) pale yellow.  Do not go on fad diets. Physical activity  Exercise often, as told by your doctor. Most adults should get up to 150 minutes of moderate-intensity exercise every week.Ask your doctor: ? What types of exercise are safe for you. ? How often you should exercise.  Warm up and stretch before being active.  Do slow stretching after being active (cool down).  Rest between times of being active. Lifestyle  Work with your doctor and a food expert (dietitian) to set a weight-loss goal that is best for you.  Limit your screen time.  Find ways to reward yourself that do not involve food.  Do not drink alcohol if: ? Your doctor tells you not to drink. ? You are pregnant, may be pregnant, or are planning to become pregnant.  If you drink alcohol: ? Limit how much you use to:  0-1  drink a day for women.  0-2 drinks a day for men. ? Be aware of how much alcohol is in your drink. In the [place] one drink equals one 12 oz bottle of beer (355 mL), one 5 oz glass of wine (148 mL), or one 1 oz glass of hard liquor (44 mL). General instructions  Keep a weight-loss journal. This can help you keep track of: ? The food that you eat. ? How much exercise you get.  Take over-the-counter and prescription medicines only as told by your doctor.  Take vitamins and supplements only as told by your doctor.  Think about joining a support group.  Keep all follow-up visits as told by your doctor. This is important. Contact a doctor if:  You cannot meet your weight loss goal after you have changed your diet and lifestyle for 6 weeks. Get help right away if you:  Are having trouble breathing.  Are having thoughts of harming yourself. Summary  Obesity is having too much body fat.  Being obese means that your weight is more than what is healthy for you.  Work with your doctor to set a weight-loss goal.  Get regular exercise as told by your doctor. This information is not intended to replace advice given to you by your health care provider. Make sure you discuss any questions you have with your health care provider. Document Revised: 10/16/2017 Document Reviewed: 10/16/2017 Elsevier Patient Education  2020 [employer].
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
## 2019-09-21 ENCOUNTER — Telehealth: Payer: Self-pay

## 2019-09-21 ENCOUNTER — Other Ambulatory Visit: Payer: Self-pay | Admitting: Internal Medicine

## 2019-09-21 ENCOUNTER — Encounter: Payer: Self-pay | Admitting: Internal Medicine

## 2019-09-21 DIAGNOSIS — I10 Essential (primary) hypertension: Secondary | ICD-10-CM

## 2019-09-21 DIAGNOSIS — N400 Enlarged prostate without lower urinary tract symptoms: Secondary | ICD-10-CM

## 2019-09-21 DIAGNOSIS — R7303 Prediabetes: Secondary | ICD-10-CM | POA: Insufficient documentation

## 2019-09-21 LAB — LIPID PANEL
Chol/HDL Ratio: 4.4 ratio (ref 0.0–5.0)
Cholesterol, Total: 217 mg/dL — ABNORMAL HIGH (ref 100–199)
HDL: 49 mg/dL (ref >39)
LDL Chol Calc (NIH): 133 mg/dL — ABNORMAL HIGH (ref 0–99)
Triglycerides: 196 mg/dL — ABNORMAL HIGH (ref 0–149)
VLDL Cholesterol Cal: 35 mg/dL (ref 5–40)

## 2019-09-21 LAB — COMPREHENSIVE METABOLIC PANEL
ALT: 18 IU/L (ref 0–44)
AST: 13 IU/L (ref 0–40)
Albumin/Globulin Ratio: 1.6 (ref 1.2–2.2)
Albumin: 4.5 g/dL (ref 3.8–4.9)
Alkaline Phosphatase: 76 IU/L (ref 48–121)
BUN/Creatinine Ratio: 17 (ref 9–20)
BUN: 18 mg/dL (ref 6–24)
Bilirubin Total: 0.4 mg/dL (ref 0.0–1.2)
CO2: 25 mmol/L (ref 20–29)
Calcium: 9.8 mg/dL (ref 8.7–10.2)
Chloride: 101 mmol/L (ref 96–106)
Creatinine, Ser: 1.08 mg/dL (ref 0.76–1.27)
GFR calc Af Amer: 88 mL/min/{1.73_m2} (ref >59)
GFR calc non Af Amer: 76 mL/min/{1.73_m2} (ref >59)
Globulin, Total: 2.8 g/dL (ref 1.5–4.5)
Glucose: 110 mg/dL — ABNORMAL HIGH (ref 65–99)
Potassium: 3.6 mmol/L (ref 3.5–5.2)
Sodium: 140 mmol/L (ref 134–144)
Total Protein: 7.3 g/dL (ref 6.0–8.5)

## 2019-09-21 LAB — CBC
Hematocrit: 47.3 % (ref 37.5–51.0)
Hemoglobin: 16.3 g/dL (ref 13.0–17.7)
MCH: 30.6 pg (ref 26.6–33.0)
MCHC: 34.5 g/dL (ref 31.5–35.7)
MCV: 89 fL (ref 79–97)
Platelets: 290 10*3/uL (ref 150–450)
RBC: 5.33 x10E6/uL (ref 4.14–5.80)
RDW: 13.9 % (ref 11.6–15.4)
WBC: 6.7 10*3/uL (ref 3.4–10.8)

## 2019-09-21 LAB — VITAMIN B12: Vitamin B-12: 423 pg/mL (ref 232–1245)

## 2019-09-21 LAB — HEMOGLOBIN A1C
Est. average glucose Bld gHb Est-mCnc: 120 mg/dL
Hgb A1c MFr Bld: 5.8 % — ABNORMAL HIGH (ref 4.8–5.6)

## 2019-09-21 LAB — PSA: Prostate Specific Ag, Serum: 0.4 ng/mL (ref 0.0–4.0)

## 2019-09-21 MED ORDER — TAMSULOSIN HCL 0.4 MG PO CAPS: 0.4000 mg | ORAL_CAPSULE | Freq: Every day | ORAL | 1 refills | Status: DC

## 2019-09-21 MED ORDER — HYDROCHLOROTHIAZIDE 25 MG PO TABS: 25.0000 mg | ORAL_TABLET | Freq: Every day | ORAL | 1 refills | Status: DC

## 2019-09-21 MED ORDER — ATORVASTATIN CALCIUM 20 MG PO TABS: 20.0000 mg | ORAL_TABLET | Freq: Every day | ORAL | 3 refills | Status: DC

## 2019-09-21 MED ORDER — ACYCLOVIR 400 MG PO TABS: 400.0000 mg | ORAL_TABLET | Freq: Two times a day (BID) | ORAL | 1 refills | Status: DC

## 2019-09-21 NOTE — Telephone Encounter (Signed)
Pt requesting all future medication refills be for 90 days for new insurance; acyclovare & hydrochlorothiazide & tamsulosin pt req 90 days supply at refill to [facility].

## 2019-09-21 NOTE — Telephone Encounter (Signed)
90 day refills on requested meds sent to pharmacy.

## 2019-09-22 MED FILL — ?ATORVASTATIN 20 MG TABLET: 20 | 30 days supply | Qty: 30 | Fill #0

## 2019-10-01 ENCOUNTER — Encounter: Payer: Self-pay | Admitting: Internal Medicine

## 2019-10-01 MED FILL — TAMSULOSIN HCL 0.4 MG CAP: 0.4 | 30 days supply | Qty: 30 | Fill #0

## 2019-10-01 MED FILL — HYDROCHLOROTHIAZIDE 25 MG T: 25 | 30 days supply | Qty: 30 | Fill #0

## 2019-10-07 ENCOUNTER — Encounter: Payer: Self-pay | Admitting: General Practice

## 2019-10-11 MED FILL — ?ACYCLOVIR 400 MG TABL: 400 | 30 days supply | Qty: 60 | Fill #6

## 2019-10-22 ENCOUNTER — Encounter: Payer: Self-pay | Admitting: Internal Medicine

## 2019-10-22 MED FILL — ?ATORVASTATIN 20 MG TABLET: 20 | 30 days supply | Qty: 30 | Fill #1

## 2019-10-24 MED ORDER — TADALAFIL 10 MG PO TABS: ORAL_TABLET | ORAL | 2 refills | Status: DC

## 2019-11-02 MED FILL — TAMSULOSIN HCL 0.4 MG CAP: 0.4 | 30 days supply | Qty: 30 | Fill #1

## 2019-11-02 MED FILL — HYDROCHLOROTHIAZIDE 25 MG T: 25 | 30 days supply | Qty: 30 | Fill #1

## 2019-11-09 ENCOUNTER — Other Ambulatory Visit: Payer: Self-pay | Admitting: Internal Medicine

## 2019-11-09 MED ORDER — ACYCLOVIR 400 MG PO TABS: 400.0000 mg | ORAL_TABLET | Freq: Two times a day (BID) | ORAL | 6 refills | Status: DC

## 2019-11-09 MED FILL — ACYCLOVIR 400 MG TABLET: 400 | 30 days supply | Qty: 60 | Fill #0

## 2019-11-09 NOTE — Addendum Note (Signed)
Addended by: Karle Plumber B on: 11/09/2019 01:35 PM   Modules accepted: Orders

## 2019-11-12 ENCOUNTER — Encounter: Payer: Self-pay | Admitting: Internal Medicine

## 2019-11-12 ENCOUNTER — Other Ambulatory Visit: Payer: Self-pay

## 2019-11-12 ENCOUNTER — Ambulatory Visit: Payer: Managed Care, Other (non HMO) | Admitting: Internal Medicine

## 2019-11-12 VITALS — BP 110/70 | HR 67 | Ht 68.0 in | Wt 238.0 lb

## 2019-11-12 DIAGNOSIS — R079 Chest pain, unspecified: Secondary | ICD-10-CM | POA: Diagnosis not present

## 2019-11-12 DIAGNOSIS — I10 Essential (primary) hypertension: Secondary | ICD-10-CM | POA: Diagnosis not present

## 2019-11-12 DIAGNOSIS — Z6837 Body mass index (BMI) 37.0-37.9, adult: Secondary | ICD-10-CM

## 2019-11-12 DIAGNOSIS — E7849 Other hyperlipidemia: Secondary | ICD-10-CM

## 2019-11-12 DIAGNOSIS — Z720 Tobacco use: Secondary | ICD-10-CM | POA: Diagnosis not present
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
## 2019-11-12 NOTE — Patient Instructions (Signed)
Medication Instructions:  Your physician recommends that you continue on your current medications as directed. Please refer to the Current Medication list given to you today.  *If you need a refill on your cardiac medications before your next appointment, please call your pharmacy*   Lab Work: None If you have labs (blood work) drawn today and your tests are completely normal, you will receive your results only by: Marland Kitchen MyChart Message (if you have MyChart) OR . A paper copy in the mail If you have any lab test that is abnormal or we need to change your treatment, we will call you to review the results.   Testing/Procedures: None   Follow-Up: At [facility], you and your health needs are our priority.  As part of our continuing mission to provide you with exceptional heart care, we have created designated Provider Care Teams.  These Care Teams include your primary Cardiologist (physician) and Advanced Practice Providers (APPs -  Physician Assistants and Nurse Practitioners) who all work together to provide you with the care you need, when you need it.  We recommend signing up for the patient portal called "MyChart".  Sign up information is provided on this After Visit Summary.  MyChart is used to connect with patients for Virtual Visits (Telemedicine).  Patients are able to view lab/test results, encounter notes, upcoming appointments, etc.  Non-urgent messages can be sent to your provider as well.   To learn more about what you can do with MyChart, go to [email or web address].    Your next appointment:   1 year(s)  The format for your next appointment:   In Person  Provider:   Rudean Haskell, MD   Other Instructions  Vegan Diet A vegan diet excludes all foods that come from animals, including foods that are made from meat, fish, poultry, dairy, and eggs. A vegan diet may be followed for ethical or health reasons. What are tips for following this plan? While following  this diet, it is important to find plant sources or supplements that contain the same nutrients you would find in the foods that come from animals. Talk with your health care provider or dietitian about whether you should be taking nutritional supplements. What do I need to know about the vegan diet?  This diet includes all foods that come from plants. Some people choose not to eat sea vegetables, such as seaweed.  This diet excludes all foods that come from animals.  A vegan diet lacks certain nutrients that are commonly found in animal products. These nutrients include: ? Protein. ? Vitamin B12. ? Vitamin D. ? Iron. ? Omega-3 fatty acids. ? Calcium. ? Zinc. What foods can I eat?  Fruits Any. Vegetables Any (except sea vegetables, such as kelp and seaweed, if you choose not to eat them). Grains Any. Meats and other proteins Beans, such as black beans or kidney beans. Other legumes, such as lentils and split peas. Soy products. Nuts, such as almonds, [place] nuts, and pecans. Seeds, such as sunflower seeds. Tofu. Tempeh. Hummus. Dairy Any dairy product that is made with milk that comes from soy, almonds, rice, hemp, coconut, or any other type of nut. Fats and oils All vegetable-based oils, such as olive, canola, coconut, corn, safflower, peanut, and sesame oils. All vegan butters. Beverages Juice. Carbonated soft drinks. Tea. Sweets and desserts Any dessert that is labeled "vegan." Ice cream that is made with milk from soy, almond, rice, hemp, coconut, or other nuts and does not have other animal ingredients (  such as chocolate chips that are made from cow milk). Vitamin B12 Breakfast cereals and prepared products that have added vitamin B12. Nutritional yeast. Vitamin D Orange juice. Fortified mushrooms. Cereals with added vitamin D. Iron Dark leafy greens. Nuts. Beans. Grain products that have added iron, such as cereals. Tofu. Tempeh. Soybeans. Quinoa. Plant-based iron is  absorbed better when it is eaten with a food that has vitamin C. Omega-3 fatty acids Walnuts. Foods with added omega-3 fatty acids, such as juices. Flax seeds. Hemp seeds. Canola oil and soybean oil. Tofu. Calcium Dark leafy greens, such as kale, bok choy, Chinese cabbage, collard greens, and mustard greens. Broccoli. Okra. Soy products with added calcium. Calcium-fortified breakfast cereals. Calcium-fortified fruit juices. Figs. Zinc Wheat germ, cereals, and breads that have added zinc. Baked beans. Legumes, such as cashews, chickpeas, kidney beans, and green peas. Almonds and nut butters. Tofu and other soy products. Seasonings and condiments Nutritional yeast. Salt. Pepper. Fresh herbs. Soy sauce. Vegetable broth. The items listed above may not be a complete list of foods and beverages you can eat. Contact a dietitian for more options. What foods should I avoid? Fruits No fruits need to be avoided. All fruits are included in this diet. Vegetables Sea vegetables, such as kelp and seaweed (if you choose not to eat them). Grains No grains need to be avoided. All grains are included in this diet. Meats and other proteins Any animal meats. Poultry. Eggs. Fish. Seafood. Dairy Milk, cheese, yogurt, or ice cream that is made from cow milk, goat milk, or sheep milk. Fats and oils Butter. Lard. Beverages Cow milk. Goat milk. Sheep milk. Drinks that are sweetened with honey. Sweets and desserts Any desserts that are made with eggs or animal milk. Honey. Cheesecake. Seasonings and condiments Honey. Any condiments made with eggs or animal milk. The items listed above may not be a complete list of foods and beverages to avoid. Contact a dietitian for more information. Summary  A vegan diet excludes any foods or drinks that come from an animal.  A vegan diet may be followed for ethical or health reasons.  When following this diet, you can become deficient in certain vitamins and minerals.  These include vitamin B12, iron, calcium, and zinc. Talk with your health care provider or dietitian to make sure you get these important nutrients in your diet. This information is not intended to replace advice given to you by your health care provider. Make sure you discuss any questions you have with your health care provider. Document Revised: 03/20/2017 Document Reviewed: 03/20/2017 Elsevier Patient Education  2020 [employer] with Quitting Smoking  Quitting smoking is a physical and mental challenge. You will face cravings, withdrawal symptoms, and temptation. Before quitting, work with your health care provider to make a plan that can help you cope. Preparation can help you quit and keep you from giving in. How can I cope with cravings? Cravings usually last for 5-10 minutes. If you get through it, the craving will pass. Consider taking the following actions to help you cope with cravings:  Keep your mouth busy: ? Chew sugar-free gum. ? Suck on hard candies or a straw. ? Brush your teeth.  Keep your hands and body busy: ? Immediately change to a different activity when you feel a craving. ? Squeeze or play with a ball. ? Do an activity or a hobby, like making bead jewelry, practicing needlepoint, or working with wood. ? Mix up your normal  routine. ? Take a short exercise break. Go for a quick walk or run up and down stairs. ? Spend time in public places where smoking is not allowed.  Focus on doing something kind or helpful for someone else.  Call a friend or family member to talk during a craving.  Join a support group.  Call a quit line, such as 1-800-QUIT-NOW.  Talk with your health care provider about medicines that might help you cope with cravings and make quitting easier for you. How can I deal with withdrawal symptoms? Your body may experience negative effects as it tries to get used to not having nicotine in the system. These effects are called  withdrawal symptoms. They may include:  Feeling hungrier than normal.  Trouble concentrating.  Irritability.  Trouble sleeping.  Feeling depressed.  Restlessness and agitation.  Craving a cigarette. To manage withdrawal symptoms:  Avoid places, people, and activities that trigger your cravings.  Remember why you want to quit.  Get plenty of sleep.  Avoid coffee and other caffeinated drinks. These may worsen some of your symptoms. How can I handle social situations? Social situations can be difficult when you are quitting smoking, especially in the first few weeks. To manage this, you can:  Avoid parties, bars, and other social situations where people might be smoking.  Avoid alcohol.  Leave right away if you have the urge to smoke.  Explain to your family and friends that you are quitting smoking. Ask for understanding and support.  Plan activities with friends or family where smoking is not an option. What are some ways I can cope with stress? Wanting to smoke may cause stress, and stress can make you want to smoke. Find ways to manage your stress. Relaxation techniques can help. For example:  Breathe slowly and deeply, in through your nose and out through your mouth.  Listen to soothing, relaxing music.  Talk with a family member or friend about your stress.  Light a candle.  Soak in a bath or take a shower.  Think about a peaceful place. What are some ways I can prevent weight gain? Be aware that many people gain weight after they quit smoking. However, not everyone does. To keep from gaining weight, have a plan in place before you quit and stick to the plan after you quit. Your plan should include:  Having healthy snacks. When you have a craving, it may help to: ? Eat plain popcorn, crunchy carrots, celery, or other cut vegetables. ? Chew sugar-free gum.  Changing how you eat: ? Eat small portion sizes at meals. ? Eat 4-6 small meals throughout the day  instead of 1-2 large meals a day. ? Be mindful when you eat. Do not watch television or do other things that might distract you as you eat.  Exercising regularly: ? Make time to exercise each day. If you do not have time for a long workout, do short bouts of exercise for 5-10 minutes several times a day. ? Do some form of strengthening exercise, like weight lifting, and some form of aerobic exercise, like running or swimming.  Drinking plenty of water or other low-calorie or no-calorie drinks. Drink 6-8 glasses of water daily, or as much as instructed by your health care provider. Summary  Quitting smoking is a physical and mental challenge. You will face cravings, withdrawal symptoms, and temptation to smoke again. Preparation can help you as you go through these challenges.  You can cope with cravings by keeping  your mouth busy (such as by chewing gum), keeping your body and hands busy, and making calls to family, friends, or a helpline for people who want to quit smoking.  You can cope with withdrawal symptoms by avoiding places where people smoke, avoiding drinks with caffeine, and getting plenty of rest.  Ask your health care provider about the different ways to prevent weight gain, avoid stress, and handle social situations. This information is not intended to replace advice given to you by your health care provider. Make sure you discuss any questions you have with your health care provider. Document Revised: 01/24/2017 Document Reviewed: 02/09/2016 Elsevier Patient Education  2020 [employer].

## 2019-11-22 MED FILL — ATORVASTATIN CALCIUM 20 MG: 20 | 30 days supply | Qty: 30 | Fill #2

## 2019-12-07 ENCOUNTER — Ambulatory Visit: Payer: Managed Care, Other (non HMO) | Attending: Internal Medicine | Admitting: Internal Medicine

## 2019-12-07 ENCOUNTER — Encounter: Payer: Self-pay | Admitting: Internal Medicine

## 2019-12-07 ENCOUNTER — Other Ambulatory Visit: Payer: Self-pay | Admitting: Internal Medicine

## 2019-12-07 ENCOUNTER — Other Ambulatory Visit: Payer: Self-pay

## 2019-12-07 VITALS — BP 119/81 | HR 58 | Resp 16 | Wt 242.6 lb

## 2019-12-07 DIAGNOSIS — Z23 Encounter for immunization: Secondary | ICD-10-CM

## 2019-12-07 DIAGNOSIS — M25511 Pain in right shoulder: Secondary | ICD-10-CM

## 2019-12-07 DIAGNOSIS — M25512 Pain in left shoulder: Secondary | ICD-10-CM

## 2019-12-07 DIAGNOSIS — E669 Obesity, unspecified: Secondary | ICD-10-CM

## 2019-12-07 DIAGNOSIS — G8929 Other chronic pain: Secondary | ICD-10-CM

## 2019-12-07 DIAGNOSIS — Z720 Tobacco use: Secondary | ICD-10-CM | POA: Diagnosis not present

## 2019-12-07 DIAGNOSIS — E782 Mixed hyperlipidemia: Secondary | ICD-10-CM | POA: Diagnosis not present

## 2019-12-07 DIAGNOSIS — I10 Essential (primary) hypertension: Secondary | ICD-10-CM | POA: Diagnosis not present

## 2019-12-07 DIAGNOSIS — Z0000 Encounter for general adult medical examination without abnormal findings: Secondary | ICD-10-CM | POA: Diagnosis not present

## 2019-12-07 DIAGNOSIS — F22 Delusional disorders: Secondary | ICD-10-CM

## 2019-12-07 DIAGNOSIS — R208 Other disturbances of skin sensation: Secondary | ICD-10-CM

## 2019-12-07 DIAGNOSIS — R7303 Prediabetes: Secondary | ICD-10-CM

## 2019-12-07 DIAGNOSIS — G5601 Carpal tunnel syndrome, right upper limb: Secondary | ICD-10-CM

## 2019-12-07 MED ORDER — GABAPENTIN 300 MG PO CAPS: 300.0000 mg | ORAL_CAPSULE | Freq: Every day | ORAL | 3 refills | Status: DC

## 2019-12-07 MED FILL — ACYCLOVIR 400 MG TABLET: 400 | 30 days supply | Qty: 60 | Fill #1

## 2019-12-07 MED FILL — GABAPENTIN 300 MG CAPSULE: 300 | 30 days supply | Qty: 30 | Fill #0

## 2019-12-07 MED FILL — TAMSULOSIN HCL 0.4 MG CAP: 0.4 | 30 days supply | Qty: 30 | Fill #2

## 2019-12-07 MED FILL — HYDROCHLOROTHIAZIDE 25 MG T: 25 | 30 days supply | Qty: 30 | Fill #2

## 2019-12-07 NOTE — Patient Instructions (Signed)
I have referred you to the neurologist. We have started you on a medication called gabapentin to help decrease the burning and numbness in your feet. You should get a wrist splint for your right hand to use as needed.  Prediabetes Eating Plan Prediabetes is a condition that causes blood sugar (glucose) levels to be higher than normal. This increases the risk for developing diabetes. In order to prevent diabetes from developing, your health care provider may recommend a diet and other lifestyle changes to help you:  Control your blood glucose levels.  Improve your cholesterol levels.  Manage your blood pressure. Your health care provider may recommend working with a diet and nutrition specialist (dietitian) to make a meal plan that is best for you. What are tips for following this plan? Lifestyle  Set weight loss goals with the help of your health care team. It is recommended that most people with prediabetes lose 7% of their current body weight.  Exercise for at least 30 minutes at least 5 days a week.  Attend a support group or seek ongoing support from a mental health counselor.  Take over-the-counter and prescription medicines only as told by your health care provider. Reading food labels  Read food labels to check the amount of fat, salt (sodium), and sugar in prepackaged foods. Avoid foods that have: ? Saturated fats. ? Trans fats. ? Added sugars.  Avoid foods that have more than 300 milligrams (mg) of sodium per serving. Limit your daily sodium intake to less than 2,300 mg each day. Shopping  Avoid buying pre-made and processed foods. Cooking  Cook with olive oil. Do not use butter, lard, or ghee.  Bake, broil, grill, or boil foods. Avoid frying. Meal planning   Work with your dietitian to develop an eating plan that is right for you. This may include: ? Tracking how many calories you take in. Use a food diary, notebook, or mobile application to track what you eat at  each meal. ? Using the glycemic index (GI) to plan your meals. The index tells you how quickly a food will raise your blood glucose. Choose low-GI foods. These foods take a longer time to raise blood glucose.  Consider following a Mediterranean diet. This diet includes: ? Several servings each day of fresh fruits and vegetables. ? Eating fish at least twice a week. ? Several servings each day of whole grains, beans, nuts, and seeds. ? Using olive oil instead of other fats. ? Moderate alcohol consumption. ? Eating small amounts of red meat and whole-fat dairy.  If you have high blood pressure, you may need to limit your sodium intake or follow a diet such as the DASH eating plan. DASH is an eating plan that aims to lower high blood pressure. What foods are recommended? The items listed below may not be a complete list. Talk with your dietitian about what dietary choices are best for you. Grains Whole grains, such as whole-wheat or whole-grain breads, crackers, cereals, and pasta. Unsweetened oatmeal. Bulgur. Barley. Quinoa. Brown rice. Corn or whole-wheat flour tortillas or taco shells. Vegetables Lettuce. Spinach. Peas. Beets. Cauliflower. Cabbage. Broccoli. Carrots. Tomatoes. Squash. Eggplant. Herbs. Peppers. Onions. Cucumbers. Brussels sprouts. Fruits Berries. Bananas. Apples. Oranges. Grapes. Papaya. Mango. Pomegranate. Kiwi. Grapefruit. Cherries. Meats and other protein foods Seafood. Poultry without skin. Lean cuts of pork and beef. Tofu. Eggs. Nuts. Beans. Dairy Low-fat or fat-free dairy products, such as yogurt, cottage cheese, and cheese. Beverages Water. Tea. Coffee. Sugar-free or diet soda. Seltzer water.  Lowfat or no-fat milk. Milk alternatives, such as soy or almond milk. Fats and oils Olive oil. Canola oil. Sunflower oil. Grapeseed oil. Avocado. Walnuts. Sweets and desserts Sugar-free or low-fat pudding. Sugar-free or low-fat ice cream and other frozen treats. Seasoning and  other foods Herbs. Sodium-free spices. Mustard. Relish. Low-fat, low-sugar ketchup. Low-fat, low-sugar barbecue sauce. Low-fat or fat-free mayonnaise. What foods are not recommended? The items listed below may not be a complete list. Talk with your dietitian about what dietary choices are best for you. Grains Refined white flour and flour products, such as bread, pasta, snack foods, and cereals. Vegetables Canned vegetables. Frozen vegetables with butter or cream sauce. Fruits Fruits canned with syrup. Meats and other protein foods Fatty cuts of meat. Poultry with skin. Breaded or fried meat. Processed meats. Dairy Full-fat yogurt, cheese, or milk. Beverages Sweetened drinks, such as sweet iced tea and soda. Fats and oils Butter. Lard. Ghee. Sweets and desserts Baked goods, such as cake, cupcakes, pastries, cookies, and cheesecake. Seasoning and other foods Spice mixes with added salt. Ketchup. Barbecue sauce. Mayonnaise. Summary  To prevent diabetes from developing, you may need to make diet and other lifestyle changes to help control blood sugar, improve cholesterol levels, and manage your blood pressure.  Set weight loss goals with the help of your health care team. It is recommended that most people with prediabetes lose 7 percent of their current body weight.  Consider following a Mediterranean diet that includes plenty of fresh fruits and vegetables, whole grains, beans, nuts, seeds, fish, lean meat, low-fat dairy, and healthy oils. This information is not intended to replace advice given to you by your health care provider. Make sure you discuss any questions you have with your health care provider. Document Revised: 06/05/2018 Document Reviewed: 04/17/2016 Elsevier Patient Education  2020 [employer].
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
## 2019-12-08 LAB — LIPID PANEL
Chol/HDL Ratio: 2.8 ratio (ref 0.0–5.0)
Cholesterol, Total: 134 mg/dL (ref 100–199)
HDL: 48 mg/dL (ref >39)
LDL Chol Calc (NIH): 71 mg/dL (ref 0–99)
Triglycerides: 77 mg/dL (ref 0–149)
VLDL Cholesterol Cal: 15 mg/dL (ref 5–40)

## 2019-12-10 ENCOUNTER — Ambulatory Visit: Payer: Managed Care, Other (non HMO) | Admitting: Neurology

## 2019-12-10 ENCOUNTER — Encounter: Payer: Self-pay | Admitting: Neurology

## 2019-12-10 VITALS — BP 121/78 | HR 86 | Ht 68.0 in | Wt 237.0 lb

## 2019-12-10 DIAGNOSIS — R208 Other disturbances of skin sensation: Secondary | ICD-10-CM | POA: Diagnosis not present

## 2019-12-10 NOTE — Patient Instructions (Signed)
I recommend the following for patient at work:  I advise him to make sure he takes frequent breaks, get up and moves around at least once an hour, change positions frequently, get an ergonomic chair/better padding, alternate between sit and stand, get a floor mat with cushion for when he stands.     Foot Care, Adult Foot care is an important part of your health. Noticing and addressing any potential problems early is the best way to prevent future foot problems. How to care for your [place] your feet daily with warm water and mild soap. Do not use hot water. Then, pat your feet and the areas between your toes until they are completely dry. Do not soak your feet as this can dry your skin.  Trim your toenails straight across. Do not dig under them or around the cuticle. File the edges of your nails with an emery board or nail file.  On the skin on your feet and on dry, brittle nails, apply a moisturizing lotion or petroleum jelly that is unscented and does not contain alcohol. Do not apply lotion between your toes. Shoes and Socks   Wear clean socks or stockings every day. Make sure they are not too tight.  Wear shoes that fit properly and have enough cushioning. To break in new shoes, wear them for just a few hours a day. This prevents you from injuring your feet. Always look in your shoes before you put them on to be sure there are no objects inside. Wounds, Scrapes, Corns, and Calluses  Check your feet daily for blisters, cuts, and redness. If you cannot see the bottom of your feet, use a mirror or ask someone for help.  Do not cut corns or calluses. Do not try to remove them with medicine.  If you find a minor scrape, cut, or break in the skin on your feet, keep it and the skin around it clean and dry. These areas may be cleaned with mild soap and water. Do not clean the area with peroxide, alcohol, or iodine.  If you have a wound, scrape, corn, or callus on your  foot, look at it several times a day to make sure it is healing and is not infected. Check for: ? More redness, swelling, or pain. ? More fluid or blood. ? Warmth. ? Pus or a bad smell. General Instructions  Do not cross your legs. That may decrease the blood flow to your feet.  Do not use heating pads or hot water bottles on your feet. They may burn your skin. If you have lost feeling in your feet or legs, you may not know it is happening until it is too late.  Make sure your health care provider does a complete foot exam at least annually or more often if you have foot problems. If you have foot problems, report any cuts, sores, or bruises to your health care provider immediately. Contact a health care provider if:  You have a medical condition that increases your risk of infection and you have any cuts, sores, or bruises on your feet.  You have an injury that is not healing.  You notice redness on your legs or feet.  You feel burning or tingling in your legs or feet.  You have pain or cramps in your legs or feet.  Your legs or feet are numb.  Your feet always feel cold.  You have pain around a toenail. Get help right away if:  You have a wound, scrape, corn, or callus on your foot and: ? You have more redness, swelling, or pain. ? You have more fluid or blood. ? Your wound, scrape, corn, or callus feels warm to the touch. ? You have pus or a bad smell coming from the wound, scrape, corn, or callus. ? You have a fever.  You have a red line going up your leg. This information is not intended to replace advice given to you by your health care provider. Make sure you discuss any questions you have with your health care provider. Document Revised: 02/29/2016 Document Reviewed: 07/21/2015 Elsevier Patient Education  [place].

## 2019-12-10 NOTE — Progress Notes (Signed)
[facility]    Provider:  Dr Jaynee Eagles Requesting Provider: Ladell Pier, MD Primary Care Provider:  Ladell Pier, MD  CC:  Feet burn only after sitting for too long at work  HPI:  Samuel Irwin is a 59 y.o. male here as requested by Ladell Pier, MD for burning in the feet when sitting. He sits on a stool all day 10-12 hours a day. His feet burn only when sitting too long, when walking or at night he is fine. He is very cold in his office and his feet get cold and numb when at work. On the weekends he is fine. Also socks may be too tight. Legs are a little swollen by the end of the day. He was diagnosed with pre-diabetes and working on losing weight. It is a high bench and chair. He is a Careers information officer. It only happens if he has been sitting, if he walks around it is better. He does not have the symptoms any other time. We discussed taking breaks, the low temperature makes his feet feel numb so warm socks or talk to his boss, ergonomic chair. No other focal neurologic deficits, associated symptoms, inciting events or modifiable factors.  Reviewed notes, labs and imaging from outside physicians, which showed:  B12 423, hgba1c 5.9, cbc, cmp normal, RPR NR, Hep C negative, Rapid HIV NR, Vit D 41.3, TSH 1.450  XR foot 03/2018: IMPRESSION: Osteoarthritic change in the first MTP joint. Small posterior calcaneal spur. No fracture or dislocation. No erosive changes. Reviewed images and agree.   Review of Systems: Patient complains of symptoms per HPI as well as the following symptoms burning feet. Pertinent negatives and positives per HPI. All others negative.   Social History   Socioeconomic History   Marital status: Single    Spouse name: Not on file   Number of children: Not on file   Years of education: Not on file   Highest education level: Not on file  Occupational History   Occupation: unemployed  Tobacco Use   Smoking status: Current  Some Day Smoker    Packs/day: 0.25    Years: 43.00    Pack years: 10.75    Types: Cigarettes   Smokeless tobacco: Never Used   Tobacco comment: occas still smokes when he drinks  Vaping Use   Vaping Use: Never used  Substance and Sexual Activity   Alcohol use: Yes    Comment: once weekly   Drug use: Not Currently    Types: Cocaine, "Crack" cocaine    Comment: smoked crack   Sexual activity: Not Currently    Comment: "when I drink"  Other Topics Concern   Not on file  Social History Narrative   Not on file   Social Determinants of Health   Financial Resource Strain:    Difficulty of Paying Living Expenses: Not on file  Food Insecurity:    Worried About Charity fundraiser in the Last Year: Not on file   [employer] of Food in the Last Year: Not on file  Transportation Needs:    Lack of Transportation (Medical): Not on file   Lack of Transportation (Non-Medical): Not on file  Physical Activity:    Days of Exercise per Week: Not on file   Minutes of Exercise per Session: Not on file  Stress:    Feeling of Stress : Not on file  Social Connections:    Frequency of Communication with Friends and Family: Not on  file   Frequency of Social Gatherings with Friends and Family: Not on file   Attends Religious Services: Not on file   Active Member of Clubs or Organizations: Not on file   Attends Archivist Meetings: Not on file   Marital Status: Not on file  Intimate Partner Violence:    Fear of Current or Ex-Partner: Not on file   Emotionally Abused: Not on file   Physically Abused: Not on file   Sexually Abused: Not on file    Family History  Problem Relation Age of Onset   Hypertension Mother    Cancer Father        lung   Colon cancer Neg Hx     Past Medical History:  Diagnosis Date   Constipation    Cough    Generalized headaches    GERD (gastroesophageal reflux disease)    H/O hiatal hernia    Heart murmur    Herpes     Hypertension    Sleep apnea    uses BIPAP   Umbilical hernia    Weakness    Wheezing     Patient Active Problem List   Diagnosis Date Noted   Chronic pain of both shoulders 12/07/2019   Prediabetes 09/21/2019   Paranoid state ([place]) 09/20/2019   Tobacco use 02/10/2019   Chronic cough 12/29/2018   Other headache syndrome 02/12/2018   Arthritis pain of hand 02/12/2018   Scrotal mass 08/04/2017   Obesity (BMI 35.0-39.9 without comorbidity) 03/10/2017   Burning sensation of feet 09/23/2016   Floaters in visual field, right 09/23/2016   Osteoarthritis of right knee 02/28/2016   Chronic bilateral low back pain 02/28/2016   Poor dentition 01/29/2016   Essential hypertension 06/06/2015   BPH (benign prostatic hyperplasia) 05/04/2015   Other seasonal allergic rhinitis 05/04/2015   Target of perceived adverse discrimination or persecution 04/05/2015   Hemorrhoids 02/23/2015   Constipation 02/23/2015   GERD (gastroesophageal reflux disease) 09/21/2014   Carpal tunnel syndrome 08/12/2014   Erectile dysfunction 08/20/2013   Hyperlipidemia 08/20/2013   Class 2 severe obesity due to excess calories with serious comorbidity and body mass index (BMI) of 37.0 to 37.9 in adult ([place]) 08/20/2013   OSA (obstructive sleep apnea) 08/24/2012    Past Surgical History:  Procedure Laterality Date   BIOPSY  10/18/2014   Procedure: BIOPSY (Gastric);  Surgeon: Danie Binder, MD;  Location: [facility];  Service: Endoscopy;;   CARPAL TUNNEL RELEASE     R hand   COLONOSCOPY WITH PROPOFOL N/A 10/18/2014   VHQ:IONGEXBM size external hemorrhoids/left colon is redundant   ESOPHAGOGASTRODUODENOSCOPY (EGD) WITH PROPOFOL N/A 10/18/2014   SLF: epigastric pain due to moderate NSAID gastritis   FEMUR SURGERY     left   HERNIA REPAIR  04/07/12   umb hernia repair   INSERTION OF MESH N/A 04/07/2012   Procedure: INSERTION OF MESH;  Surgeon: Madilyn Hook, DO;  Location: [place];   Service: General;  Laterality: N/A;   KNEE ARTHROSCOPY     right   MINOR CARPAL TUNNEL Left 01/31/2016   Procedure: MINOR CARPAL TUNNEL RELEASE LEFT;  Surgeon: Charlotte Crumb, MD;  Location: [place];  Service: Orthopedics;  Laterality: Left;  Local   TOE SURGERY     dislocated lt foot   UMBILICAL HERNIA REPAIR N/A 04/07/2012   Procedure: HERNIA REPAIR UMBILICAL ADULT;  Surgeon: Madilyn Hook, DO;  Location: [place];  Service: General;  Laterality: N/A;  open umbilical hernia repair  with mesh    Current Outpatient Medications  Medication Sig Dispense Refill   acyclovir (ZOVIRAX) 400 MG tablet Take 1 tablet (400 mg total) by mouth 2 (two) times daily. 60 tablet 6   aspirin EC 81 MG tablet Take 81 mg by mouth daily.     atorvastatin (LIPITOR) 20 MG tablet Take 1 tablet (20 mg total) by mouth daily. 90 tablet 3   azelastine (ASTELIN) 0.1 % nasal spray Place into both nostrils as needed for rhinitis. Use in each nostril as directed     fluticasone (FLONASE) 50 MCG/ACT nasal spray Place 2 sprays into both nostrils daily. 16 g 2   gabapentin (NEURONTIN) 300 MG capsule Take 1 capsule (300 mg total) by mouth at bedtime. 30 capsule 3   glucosamine-chondroitin 500-400 MG tablet Take 1-2 tablets by mouth daily.     hydrochlorothiazide (HYDRODIURIL) 25 MG tablet Take 1 tablet (25 mg total) by mouth daily. Must have office visit for refills 90 tablet 1   ibuprofen (ADVIL) 800 MG tablet Take 800 mg by mouth as needed for moderate pain.     meclizine (ANTIVERT) 25 MG tablet Take 1 tablet (25 mg total) by mouth 3 (three) times daily as needed for dizziness. 30 tablet 0   nicotine polacrilex (NICORETTE MINI) 2 MG lozenge Use as needed to quit smoking 40 lozenge 2   tadalafil (CIALIS) 10 MG tablet Take 10 mg by mouth daily as needed for erectile dysfunction.     tamsulosin (FLOMAX) 0.4 MG CAPS capsule Take 1 capsule (0.4 mg total) by mouth daily. 90 capsule 1   No current  facility-administered medications for this visit.    Allergies as of 12/10/2019   (No Known Allergies)    Vitals: BP 121/78    Pulse 86    Ht 5\' 8"  (1.727 m)    Wt 237 lb (107.5 kg)    BMI 36.04 kg/m  Last Weight:  Wt Readings from Last 1 Encounters:  12/10/19 237 lb (107.5 kg)   Last Height:   Ht Readings from Last 1 Encounters:  12/10/19 5\' 8"  (1.727 m)     Physical exam: Exam: Gen: NAD, conversant, well nourised, obese, well groomed                     CV: RRR, no MRG. No Carotid Bruits. No peripheral edema, warm, nontender Eyes: Conjunctivae clear without exudates or hemorrhage  Neuro: Detailed Neurologic Exam  Speech:    Speech is normal; fluent and spontaneous with normal comprehension.  Cognition:    The patient is oriented to person, place, and time;     recent and remote memory intact;     language fluent;     normal attention, concentration,     fund of knowledge Cranial Nerves:    The pupils are equal, round, and reactive to light. Pupils too small to visualize fundi. Visual fields are full to finger confrontation. Extraocular movements are intact. Trigeminal sensation is intact and the muscles of mastication are normal. The face is symmetric. The palate elevates in the midline. Hearing intact. Voice is normal. Shoulder shrug is normal. The tongue has normal motion without fasciculations.   Coordination:    No dysmetria or ataxia  Gait:    Normal native gait  Motor Observation:    No asymmetry, no atrophy, and no involuntary movements noted. Tone:    Normal muscle tone.    Posture:    Posture is normal. normal erect  Strength:    Strength is V/V in the upper and lower limbs.      Sensation: intact to LT, pin prick, vibration     Reflex Exam:  DTR's:    Deep tendon reflexes in the upper and lower extremities are symmetrical bilaterally.   Toes:    The toes are downgoing bilaterally.   Clonus:    Clonus is absent.    Assessment/Plan:   His feet are warm, good pulses, intact to all sensations distally, intact reflexes. I don;t see any sign of peripheral polyneuropathy. The symptoms in his feet ONLY happen when sitting for too long at work, doesn't happen at night or at home or on the weekend so appears to be positional maybe compressive by sitting for hours. If he gets up it goes away. I advised him to make sure he takes frequent breaks, gets up and moves around at least once an hour, changes positions frequently, consider an ergonomic chair/better padding, alternate between sit and stand, get a floor mat with cushion for when he stands. Will write a letter for him for work. Also if his legs are swelling may also impact him, see Dr. [name] for eval of leg swelling. Also manage risk factors such as pre-diabetes, blood pressure, alcohol use and other vascular/nerve damage risk factors.    Orders Placed This Encounter  Procedures   TSH   Vitamin D, 25-hydroxy   No orders of the defined types were placed in this encounter.   Cc: Ladell Pier, MD,  Ladell Pier, MD  Sarina Ill, MD  [facility] [address] [place], [place] [address]  Phone [phone] Fax [phone]

## 2019-12-11 LAB — VITAMIN D 25 HYDROXY (VIT D DEFICIENCY, FRACTURES): Vit D, 25-Hydroxy: 26 ng/mL — ABNORMAL LOW (ref 30.0–100.0)

## 2019-12-11 LAB — TSH: TSH: 1.4 u[IU]/mL (ref 0.450–4.500)

## 2019-12-14 ENCOUNTER — Encounter: Payer: Self-pay | Admitting: Neurology

## 2019-12-17 ENCOUNTER — Other Ambulatory Visit: Payer: Self-pay

## 2019-12-17 ENCOUNTER — Ambulatory Visit: Payer: Managed Care, Other (non HMO) | Attending: Internal Medicine | Admitting: Pharmacist

## 2019-12-17 DIAGNOSIS — Z23 Encounter for immunization: Secondary | ICD-10-CM

## 2019-12-17 NOTE — Progress Notes (Signed)
Patient presents for vaccination against zoster per orders of Dr. [name]. Consent given. Counseling provided. No contraindications exists. Vaccine administered without incident.  Benard Halsted, PharmD, [place] [phone]

## 2019-12-23 ENCOUNTER — Encounter: Payer: Self-pay | Admitting: Internal Medicine

## 2019-12-23 MED FILL — ATORVASTATIN CALCIUM 20 MG: 20 | 30 days supply | Qty: 30 | Fill #3

## 2020-01-07 MED FILL — HYDROCHLOROTHIAZIDE 25 MG T: 25 | 30 days supply | Qty: 30 | Fill #3

## 2020-01-07 MED FILL — TAMSULOSIN HCL 0.4 MG CAP: 0.4 | 30 days supply | Qty: 30 | Fill #3

## 2020-01-07 MED FILL — ACYCLOVIR 400 MG TABLET: 400 | 30 days supply | Qty: 60 | Fill #2

## 2020-01-24 ENCOUNTER — Telehealth: Payer: Managed Care, Other (non HMO) | Admitting: Internal Medicine

## 2020-01-24 ENCOUNTER — Other Ambulatory Visit: Payer: Self-pay | Admitting: Critical Care Medicine

## 2020-01-24 MED FILL — ATORVASTATIN CALCIUM 20 MG: 20 | 30 days supply | Qty: 30 | Fill #4

## 2020-01-24 NOTE — Telephone Encounter (Signed)
Requested medication (s) are due for refill today: Yes  Requested medication (s) are on the active medication list: Yes  Last refill:  12/29/18 (Flonase); 10/2019 (Azelastine)  Future visit scheduled: Yes  Notes to clinic:  Unable to refill per protocol, expired and last refill by another provider.      Requested Prescriptions  Pending Prescriptions Disp Refills   fluticasone (FLONASE) 50 MCG/ACT nasal spray [Pharmacy Med Name: FLUTICASONE PROP 50 MCG SPR 50 SUS] 16 g 2    Sig: Place 2 sprays into both nostrils daily.      Ear, Nose, and Throat: Nasal Preparations - Corticosteroids Passed - 01/24/2020  5:03 PM      Passed - Valid encounter within last 12 months    Recent Outpatient Visits           1 month ago Need for zoster vaccination   [place], RPH-CPP   1 month ago Annual physical exam   [place] Ladell Pier, MD   4 months ago Essential hypertension   [place], MD   11 months ago Tobacco use   [place], MD   1 year ago OSA (obstructive sleep apnea)   [place] Elsie Stain, MD       Future Appointments             In 3 weeks [name], [place]   In 2 months Ladell Pier, MD [place]              azelastine (ASTELIN) 0.1 % nasal spray [Pharmacy Med Name: AZELASTINE HCL 137 MCG SPRY 0.1 Solution] 30 mL 12    Sig: PLACE 2 SPRAYS INTO BOTH NOSTRILS 2 (TWO) TIMES DAILY. USE IN EACH NOSTRIL AS DIRECTED      Ear, Nose, and Throat: Nasal Preparations - Antiallergy Passed - 01/24/2020  5:03 PM      Passed - Valid encounter within last 12 months    Recent Outpatient Visits           1 month ago Need for zoster vaccination   [place], RPH-CPP   1 month ago Annual physical exam   [place] Ladell Pier, MD   4 months ago Essential hypertension   [place], MD   11 months ago Tobacco use   [place], MD   1 year ago OSA (obstructive sleep apnea)   [place] Elsie Stain, MD       Future Appointments             In 3 weeks [name], [name], [name]   In 2 months [name], [name], MD [name]

## 2020-01-26 ENCOUNTER — Other Ambulatory Visit: Payer: Self-pay | Admitting: Internal Medicine

## 2020-01-27 MED FILL — FLUTICASONE PROP 50 MCG SPR: 50 | 30 days supply | Qty: 16 | Fill #0

## 2020-01-27 MED FILL — AZELASTINE HCL 137 MCG SPRY: 0.1 | 30 days supply | Qty: 30 | Fill #0

## 2020-02-07 MED FILL — HYDROCHLOROTHIAZIDE 25 MG T: 25 | 30 days supply | Qty: 30 | Fill #4

## 2020-02-07 MED FILL — TAMSULOSIN HCL 0.4 MG CAP: 0.4 | 30 days supply | Qty: 30 | Fill #4

## 2020-02-16 ENCOUNTER — Encounter: Payer: Self-pay | Admitting: Internal Medicine

## 2020-02-16 MED FILL — ACYCLOVIR 400 MG TABLET: 400 | 30 days supply | Qty: 60 | Fill #3

## 2020-02-17 ENCOUNTER — Ambulatory Visit: Payer: Managed Care, Other (non HMO) | Attending: Internal Medicine | Admitting: Pharmacist

## 2020-02-17 ENCOUNTER — Other Ambulatory Visit: Payer: Self-pay

## 2020-02-17 DIAGNOSIS — Z23 Encounter for immunization: Secondary | ICD-10-CM

## 2020-02-17 NOTE — Progress Notes (Signed)
Patient presents for vaccination against zoster per orders of Dr. Johnson. Consent given. Counseling provided. No contraindications exists. Vaccine administered without incident.   Luke Van Ausdall, PharmD, BCACP, CPP Clinical Pharmacist [facility] [phone]  

## 2020-02-22 ENCOUNTER — Other Ambulatory Visit: Payer: Self-pay

## 2020-02-22 ENCOUNTER — Other Ambulatory Visit (HOSPITAL_COMMUNITY): Payer: Self-pay | Admitting: Family Medicine

## 2020-02-22 ENCOUNTER — Ambulatory Visit (HOSPITAL_COMMUNITY)
Admission: EM | Admit: 2020-02-22 | Discharge: 2020-02-22 | Disposition: A | Discharge status: Home / Self Care | Payer: Managed Care, Other (non HMO) | Attending: Family Medicine | Admitting: Family Medicine

## 2020-02-22 ENCOUNTER — Encounter (HOSPITAL_COMMUNITY): Payer: Self-pay

## 2020-02-22 DIAGNOSIS — F17210 Nicotine dependence, cigarettes, uncomplicated: Secondary | ICD-10-CM | POA: Insufficient documentation

## 2020-02-22 DIAGNOSIS — J0100 Acute maxillary sinusitis, unspecified: Secondary | ICD-10-CM | POA: Insufficient documentation

## 2020-02-22 DIAGNOSIS — Z79899 Other long term (current) drug therapy: Secondary | ICD-10-CM | POA: Diagnosis not present

## 2020-02-22 DIAGNOSIS — Z791 Long term (current) use of non-steroidal anti-inflammatories (NSAID): Secondary | ICD-10-CM | POA: Insufficient documentation

## 2020-02-22 DIAGNOSIS — Z7982 Long term (current) use of aspirin: Secondary | ICD-10-CM | POA: Diagnosis not present

## 2020-02-22 DIAGNOSIS — I10 Essential (primary) hypertension: Secondary | ICD-10-CM | POA: Diagnosis not present

## 2020-02-22 DIAGNOSIS — U071 COVID-19: Secondary | ICD-10-CM | POA: Insufficient documentation

## 2020-02-22 MED ORDER — AMOXICILLIN-POT CLAVULANATE 875-125 MG PO TABS: 1.0000 | ORAL_TABLET | Freq: Two times a day (BID) | ORAL | 0 refills | Status: DC

## 2020-02-22 MED FILL — AMOX-CLAV 875-125 MG TABLET: 875-125 | 7 days supply | Qty: 14 | Fill #0

## 2020-02-22 MED FILL — ATORVASTATIN CALCIUM 20 MG: 20 | 30 days supply | Qty: 30 | Fill #5

## 2020-02-22 NOTE — ED Provider Notes (Signed)
[employer]    CSN: [id number] Arrival date & time: 02/22/20  [id number]      History   Chief Complaint Chief Complaint  Patient presents with   Headache   Cough   Fatigue    HPI Samuel Irwin is a 59 y.o. male.   Here today with 2 week history of headache, sinus pain and pressure, congestion, fatigue, dry cough. States initially had some fevers but that was only the first few days. Denies CP, SOB, abdominal pain, N/V/D. Taking allergy medications, OTC cold and congestion remedies all without relief. No known sick contacts, UTD on vaccines, no known hx of pulmonary dz.      Past Medical History:  Diagnosis Date   Constipation    Cough    Generalized headaches    GERD (gastroesophageal reflux disease)    H/O hiatal hernia    Heart murmur    Herpes    Hypertension    Sleep apnea    uses BIPAP   Umbilical hernia    Weakness    Wheezing     Patient Active Problem List   Diagnosis Date Noted   Chronic pain of both shoulders 12/07/2019   Prediabetes 09/21/2019   Paranoid state ([place]) 09/20/2019   Tobacco use 02/10/2019   Chronic cough 12/29/2018   Other headache syndrome 02/12/2018   Arthritis pain of hand 02/12/2018   Scrotal mass 08/04/2017   Obesity (BMI 35.0-39.9 without comorbidity) 03/10/2017   Burning sensation of feet 09/23/2016   Floaters in visual field, right 09/23/2016   Osteoarthritis of right knee 02/28/2016   Chronic bilateral low back pain 02/28/2016   Poor dentition 01/29/2016   Essential hypertension 06/06/2015   BPH (benign prostatic hyperplasia) 05/04/2015   Other seasonal allergic rhinitis 05/04/2015   Target of perceived adverse discrimination or persecution 04/05/2015   Hemorrhoids 02/23/2015   Constipation 02/23/2015   GERD (gastroesophageal reflux disease) 09/21/2014   Carpal tunnel syndrome 08/12/2014   Erectile dysfunction 08/20/2013   Hyperlipidemia 08/20/2013   Class 2 severe  obesity due to excess calories with serious comorbidity and body mass index (BMI) of 37.0 to 37.9 in adult ([place]) 08/20/2013   OSA (obstructive sleep apnea) 08/24/2012    Past Surgical History:  Procedure Laterality Date   BIOPSY  10/18/2014   Procedure: BIOPSY (Gastric);  Surgeon: Danie Binder, MD;  Location: [facility];  Service: Endoscopy;;   CARPAL TUNNEL RELEASE     R hand   COLONOSCOPY WITH PROPOFOL N/A 10/18/2014   [id number] size external hemorrhoids/left colon is redundant   ESOPHAGOGASTRODUODENOSCOPY (EGD) WITH PROPOFOL N/A 10/18/2014   SLF: epigastric pain due to moderate NSAID gastritis   FEMUR SURGERY     left   HERNIA REPAIR  04/07/12   umb hernia repair   INSERTION OF MESH N/A 04/07/2012   Procedure: INSERTION OF MESH;  Surgeon: Madilyn Hook, DO;  Location: [place];  Service: General;  Laterality: N/A;   KNEE ARTHROSCOPY     right   MINOR CARPAL TUNNEL Left 01/31/2016   Procedure: MINOR CARPAL TUNNEL RELEASE LEFT;  Surgeon: Charlotte Crumb, MD;  Location: [place];  Service: Orthopedics;  Laterality: Left;  Local   TOE SURGERY     dislocated lt foot   UMBILICAL HERNIA REPAIR N/A 04/07/2012   Procedure: HERNIA REPAIR UMBILICAL ADULT;  Surgeon: Madilyn Hook, DO;  Location: [place];  Service: General;  Laterality: N/A;  open umbilical hernia repair with mesh  Home Medications    Prior to Admission medications   Medication Sig Start Date End Date Taking? Authorizing Provider  amoxicillin-clavulanate (AUGMENTIN) 875-125 MG tablet Take 1 tablet by mouth every 12 (twelve) hours. 02/22/20  Yes Particia Nearing, PA-C  acyclovir (ZOVIRAX) 400 MG tablet Take 1 tablet (400 mg total) by mouth 2 (two) times daily. 11/09/19   Marcine Matar, MD  aspirin EC 81 MG tablet Take 81 mg by mouth daily.    [provider]  atorvastatin (LIPITOR) 20 MG tablet Take 1 tablet (20 mg total) by mouth daily. 09/21/19   Marcine Matar, MD   azelastine (ASTELIN) 0.1 % nasal spray PLACE 2 SPRAYS INTO BOTH NOSTRILS 2 (TWO) TIMES DAILY. USE IN [facility] NOSTRIL AS DIRECTED 01/26/20   Marcine Matar, MD  fluticasone (FLONASE) 50 MCG/ACT nasal spray PLACE 2 SPRAYS INTO BOTH NOSTRILS DAILY. 01/26/20   Marcine Matar, MD  gabapentin (NEURONTIN) 300 MG capsule Take 1 capsule (300 mg total) by mouth at bedtime. 12/07/19   Marcine Matar, MD  glucosamine-chondroitin 500-400 MG tablet Take 1-2 tablets by mouth daily.    [provider]  hydrochlorothiazide (HYDRODIURIL) 25 MG tablet Take 1 tablet (25 mg total) by mouth daily. Must have office visit for refills 09/21/19   Marcine Matar, MD  ibuprofen (ADVIL) 800 MG tablet Take 800 mg by mouth as needed for moderate pain.    [provider]  meclizine (ANTIVERT) 25 MG tablet Take 1 tablet (25 mg total) by mouth 3 (three) times daily as needed for dizziness. 09/08/19   Hoy Register, MD  nicotine polacrilex (NICORETTE MINI) 2 MG lozenge Use as needed to quit smoking 02/10/19   Storm Frisk, MD  tadalafil (CIALIS) 10 MG tablet Take 10 mg by mouth daily as needed for erectile dysfunction.    [provider]  tamsulosin (FLOMAX) 0.4 MG CAPS capsule Take 1 capsule (0.4 mg total) by mouth daily. 09/21/19   Marcine Matar, MD    Family History Family History  Problem Relation Age of Onset   Hypertension Mother    Cancer Father        lung   Colon cancer Neg Hx     Social History Social History   Tobacco Use   Smoking status: Current Some Day Smoker    Packs/day: 0.25    Years: 43.00    Pack years: 10.75    Types: Cigarettes   Smokeless tobacco: Never Used   Tobacco comment: occas still smokes when he drinks  Vaping Use   Vaping Use: Never used  Substance Use Topics   Alcohol use: Yes    Comment: once weekly   Drug use: Not Currently    Types: Cocaine, "Crack" cocaine    Comment: smoked crack     Allergies   Patient has no  known allergies.   Review of Systems Review of Systems PER HPI   Physical Exam Triage Vital Signs ED Triage Vitals  Enc Vitals Group     BP 02/22/20 1121 108/77     Pulse Rate 02/22/20 1120 74     Resp 02/22/20 1120 17     Temp 02/22/20 1120 98.1 F (36.7 C)     Temp Source 02/22/20 1120 Oral     SpO2 02/22/20 1120 99 %     Weight --      Height --      Head Circumference --      Peak Flow --  Pain Score 02/22/20 1121 5     Pain Loc --      Pain Edu? --      Excl. in [place]? --    No data found.  Updated Vital Signs BP 108/77    Pulse 74    Temp 98.1 F (36.7 C) (Oral)    Resp 17    SpO2 99%   Visual Acuity Right Eye Distance:   Left Eye Distance:   Bilateral Distance:    Right Eye Near:   Left Eye Near:    Bilateral Near:     Physical Exam Vitals and nursing note reviewed.  Constitutional:      Appearance: Normal appearance.  HENT:     Head: Atraumatic.     Right Ear: Tympanic membrane normal.     Left Ear: Tympanic membrane normal.     Nose: Congestion present.     Mouth/Throat:     Mouth: Mucous membranes are moist.     Pharynx: Posterior oropharyngeal erythema present. No oropharyngeal exudate.  Eyes:     Extraocular Movements: Extraocular movements intact.     Conjunctiva/sclera: Conjunctivae normal.  Cardiovascular:     Rate and Rhythm: Normal rate and regular rhythm.     Heart sounds: Normal heart sounds.  Pulmonary:     Effort: Pulmonary effort is normal. No respiratory distress.     Breath sounds: Normal breath sounds. No wheezing or rales.  Abdominal:     General: Bowel sounds are normal. There is no distension.     Palpations: Abdomen is soft.     Tenderness: There is no abdominal tenderness. There is no guarding.  Musculoskeletal:        General: Normal range of motion.     Cervical back: Normal range of motion and neck supple.  Skin:    General: Skin is warm and dry.  Neurological:     General: No focal deficit present.      Mental Status: He is oriented to person, place, and time.  Psychiatric:        Mood and Affect: Mood normal.        Thought Content: Thought content normal.        Judgment: Judgment normal.      UC Treatments / Results  Labs (all labs ordered are listed, but only abnormal results are displayed) Labs Reviewed  SARS CORONAVIRUS 2 (TAT 6-24 HRS)    EKG   Radiology No results found.  Procedures Procedures (including critical care time)  Medications Ordered in UC Medications - No data to display  Initial Impression / Assessment and Plan / UC Course  I have reviewed the triage vital signs and the nursing notes.  Pertinent labs & imaging results that were available during my care of the patient were reviewed by me and considered in my medical decision making (see chart for details).     COVID pcr pending though out of quarantine window, will treat for sinusitis given duration and worsening sinus pain and pressure with augmentin, continue allergy regimen with nasal sprays, mucinex, supportive home care. Return precautions given.    Final Clinical Impressions(s) / UC Diagnoses   Final diagnoses:  Acute non-recurrent maxillary sinusitis   Discharge Instructions   None    ED Prescriptions    Medication Sig Dispense Auth. Provider   amoxicillin-clavulanate (AUGMENTIN) 875-125 MG tablet Take 1 tablet by mouth every 12 (twelve) hours. 14 tablet Volney American, [place]     PDMP not reviewed this  encounter.   Volney American, [place] 02/22/20 1256

## 2020-02-22 NOTE — ED Triage Notes (Signed)
Pt presents with non productive cough, fatigue, and headache for about 2 weeks.

## 2020-02-23 LAB — SARS CORONAVIRUS 2 (TAT 6-24 HRS): SARS Coronavirus 2: POSITIVE — AB

## 2020-03-09 MED FILL — TAMSULOSIN HCL 0.4 MG CAP: 0.4 | 30 days supply | Qty: 30 | Fill #5

## 2020-03-09 MED FILL — HYDROCHLOROTHIAZIDE 25 MG T: 25 | 30 days supply | Qty: 30 | Fill #5

## 2020-03-24 MED FILL — ATORVASTATIN CALCIUM 20 MG: 20 | 30 days supply | Qty: 30 | Fill #6

## 2020-03-24 MED FILL — ACYCLOVIR 400 MG TABLET: 400 | 30 days supply | Qty: 60 | Fill #4

## 2020-04-07 ENCOUNTER — Other Ambulatory Visit: Payer: Self-pay | Admitting: Critical Care Medicine

## 2020-04-07 ENCOUNTER — Encounter: Payer: Self-pay | Admitting: Internal Medicine

## 2020-04-07 ENCOUNTER — Other Ambulatory Visit: Payer: Self-pay | Admitting: Internal Medicine

## 2020-04-07 DIAGNOSIS — R208 Other disturbances of skin sensation: Secondary | ICD-10-CM

## 2020-04-07 DIAGNOSIS — I10 Essential (primary) hypertension: Secondary | ICD-10-CM

## 2020-04-07 MED FILL — HYDROCHLOROTHIAZIDE 25 MG T: 25 | 30 days supply | Qty: 30 | Fill #0

## 2020-04-10 ENCOUNTER — Other Ambulatory Visit: Payer: Self-pay | Admitting: Internal Medicine

## 2020-04-10 ENCOUNTER — Ambulatory Visit: Payer: Managed Care, Other (non HMO) | Attending: Internal Medicine | Admitting: Internal Medicine

## 2020-04-10 ENCOUNTER — Encounter: Payer: Self-pay | Admitting: Internal Medicine

## 2020-04-10 ENCOUNTER — Other Ambulatory Visit: Payer: Self-pay

## 2020-04-10 VITALS — BP 110/76 | HR 86 | Resp 16 | Wt 244.2 lb

## 2020-04-10 DIAGNOSIS — R202 Paresthesia of skin: Secondary | ICD-10-CM | POA: Diagnosis not present

## 2020-04-10 DIAGNOSIS — Z6837 Body mass index (BMI) 37.0-37.9, adult: Secondary | ICD-10-CM

## 2020-04-10 DIAGNOSIS — Z720 Tobacco use: Secondary | ICD-10-CM

## 2020-04-10 DIAGNOSIS — N400 Enlarged prostate without lower urinary tract symptoms: Secondary | ICD-10-CM

## 2020-04-10 DIAGNOSIS — E782 Mixed hyperlipidemia: Secondary | ICD-10-CM

## 2020-04-10 DIAGNOSIS — I10 Essential (primary) hypertension: Secondary | ICD-10-CM | POA: Diagnosis not present

## 2020-04-10 MED ORDER — HYDROCHLOROTHIAZIDE 25 MG PO TABS: 25.0000 mg | ORAL_TABLET | Freq: Every day | ORAL | 1 refills | Status: DC

## 2020-04-10 MED FILL — TAMSULOSIN HCL 0.4 MG CAP: 0.4 | 30 days supply | Qty: 30 | Fill #0

## 2020-04-10 NOTE — Progress Notes (Signed)
Patient ID: Samuel Irwin, male    DOB: October 27, 1960  MRN: [id number]  CC: Hypertension   Subjective: Samuel Irwin is a 60 y.o. male who presents for chronic ds management. His concerns today include:  Pt with hxo fo HTN, HL, obesity, GERD, ED, tob dep, BPH, OSA on Bipap, Vit D def, dep/anxiety, paranoia (see 08/2017 visit), OA knee, COVID 19 01/2020  HTN:  Compliant with HCTZ.  Did not take HCTZ today.  Out x 1 day.  Does well with salt restriction but used some salt yesterday Does have a device but not checking BP regularly  Needs RF on Tamulosin Taking Lipitor as prescribed  Goes to gym 3 x a wk - walks 2 miles. Eating habits better. Tends to splurge on wkends.   Tob dep: continues to cut down.  Past 2 wks he has smoked only on wkends. Not mental ready.  Reports still stress; feels harassed by people x 13 yrs. Feels people trying to run him out of his house.  Complains of some tingling at the tips of the third and fourth fingers on the right hand for the past few months.  Has carpal tunnel syndrome and has carpal tunnel release in the past.  No weakness in the hands.  HM: had 2 shots Pfizer.  Agreeable to getting the booster. Patient Active Problem List   Diagnosis Date Noted  . Chronic pain of both shoulders 12/07/2019  . Prediabetes 09/21/2019  . Paranoid state ([place]) 09/20/2019  . Tobacco use 02/10/2019  . Chronic cough 12/29/2018  . Other headache syndrome 02/12/2018  . Arthritis pain of hand 02/12/2018  . Scrotal mass 08/04/2017  . Obesity (BMI 35.0-39.9 without comorbidity) 03/10/2017  . Burning sensation of feet 09/23/2016  . Floaters in visual field, right 09/23/2016  . Osteoarthritis of right knee 02/28/2016  . Chronic bilateral low back pain 02/28/2016  . Poor dentition 01/29/2016  . Essential hypertension 06/06/2015  . BPH (benign prostatic hyperplasia) 05/04/2015  . Other seasonal allergic rhinitis 05/04/2015  . Target of perceived adverse discrimination  or persecution 04/05/2015  . Hemorrhoids 02/23/2015  . Constipation 02/23/2015  . GERD (gastroesophageal reflux disease) 09/21/2014  . Carpal tunnel syndrome 08/12/2014  . Erectile dysfunction 08/20/2013  . Hyperlipidemia 08/20/2013  . Class 2 severe obesity due to excess calories with serious comorbidity and body mass index (BMI) of 37.0 to 37.9 in adult ([place]) 08/20/2013  . OSA (obstructive sleep apnea) 08/24/2012     Current Outpatient Medications on File Prior to Visit  Medication Sig Dispense Refill  . acyclovir (ZOVIRAX) 400 MG tablet Take 1 tablet (400 mg total) by mouth 2 (two) times daily. 60 tablet 6  . aspirin EC 81 MG tablet Take 81 mg by mouth daily.    Marland Kitchen atorvastatin (LIPITOR) 20 MG tablet Take 1 tablet (20 mg total) by mouth daily. 90 tablet 3  . azelastine (ASTELIN) 0.1 % nasal spray PLACE 2 SPRAYS INTO BOTH NOSTRILS 2 (TWO) TIMES DAILY. USE IN EACH NOSTRIL AS DIRECTED 30 mL 12  . fluticasone (FLONASE) 50 MCG/ACT nasal spray PLACE 2 SPRAYS INTO BOTH NOSTRILS DAILY. 16 g 2  . gabapentin (NEURONTIN) 300 MG capsule Take 1 capsule (300 mg total) by mouth at bedtime. 30 capsule 3  . glucosamine-chondroitin 500-400 MG tablet Take 1-2 tablets by mouth daily.    Marland Kitchen ibuprofen (ADVIL) 800 MG tablet Take 800 mg by mouth as needed for moderate pain.    . meclizine (ANTIVERT) 25 MG tablet Take  1 tablet (25 mg total) by mouth 3 (three) times daily as needed for dizziness. 30 tablet 0  . nicotine polacrilex (NICORETTE MINI) 2 MG lozenge Use as needed to quit smoking 40 lozenge 2  . tadalafil (CIALIS) 10 MG tablet Take 10 mg by mouth daily as needed for erectile dysfunction.     No current facility-administered medications on file prior to visit.    No Known Allergies  Social History   Socioeconomic History  . Marital status: Single    Spouse name: Not on file  . Number of children: Not on file  . Years of education: Not on file  . Highest education level: Not on file   Occupational History  . Occupation: unemployed  Tobacco Use  . Smoking status: Current Some Day Smoker    Packs/day: 0.25    Years: 43.00    Pack years: 10.75    Types: Cigarettes  . Smokeless tobacco: Never Used  . Tobacco comment: occas still smokes when he drinks  Vaping Use  . Vaping Use: Never used  Substance and Sexual Activity  . Alcohol use: Yes    Comment: once weekly  . Drug use: Not Currently    Types: Cocaine, "Crack" cocaine    Comment: smoked crack  . Sexual activity: Not Currently    Comment: "when I drink"  Other Topics Concern  . Not on file  Social History Narrative  . Not on file   Social Determinants of Health   Financial Resource Strain: Not on file  Food Insecurity: Not on file  Transportation Needs: Not on file  Physical Activity: Not on file  Stress: Not on file  Social Connections: Not on file  Intimate Partner Violence: Not on file    Family History  Problem Relation Age of Onset  . Hypertension Mother   . Cancer Father        lung  . Colon cancer Neg Hx     Past Surgical History:  Procedure Laterality Date  . BIOPSY  10/18/2014   Procedure: BIOPSY (Gastric);  Surgeon: Danie Binder, MD;  Location: [facility];  Service: Endoscopy;;  . CARPAL TUNNEL RELEASE     R hand  . COLONOSCOPY WITH PROPOFOL N/A 10/18/2014   [id number] size external hemorrhoids/left colon is redundant  . ESOPHAGOGASTRODUODENOSCOPY (EGD) WITH PROPOFOL N/A 10/18/2014   SLF: epigastric pain due to moderate NSAID gastritis  . FEMUR SURGERY     left  . HERNIA REPAIR  04/07/12   umb hernia repair  . INSERTION OF MESH N/A 04/07/2012   Procedure: INSERTION OF MESH;  Surgeon: Madilyn Hook, DO;  Location: [facility];  Service: General;  Laterality: N/A;  . KNEE ARTHROSCOPY     right  . MINOR CARPAL TUNNEL Left 01/31/2016   Procedure: MINOR CARPAL TUNNEL RELEASE LEFT;  Surgeon: Charlotte Crumb, MD;  Location: [facility];  Service: Orthopedics;  Laterality: Left;   Local  . TOE SURGERY     dislocated lt foot  . UMBILICAL HERNIA REPAIR N/A 04/07/2012   Procedure: HERNIA REPAIR UMBILICAL ADULT;  Surgeon: Madilyn Hook, DO;  Location: [facility];  Service: General;  Laterality: N/A;  open umbilical hernia repair with mesh    ROS: Review of Systems Negative except as stated above  PHYSICAL EXAM: BP 110/76   Pulse 86   Resp 16   Wt 244 lb 3.2 oz (110.8 kg)   SpO2 95%   BMI 37.13 kg/m   Wt Readings from Last 3  Encounters:  04/10/20 244 lb 3.2 oz (110.8 kg)  12/10/19 237 lb (107.5 kg)  12/07/19 242 lb 9.6 oz (110 kg)    Physical Exam   General appearance - alert, well appearing, and in no distress Mental status - alert, oriented to person, place, and time Chest - clear to auscultation, no wheezes, rales or rhonchi, symmetric air entry Heart - normal rate, regular rhythm, normal S1, S2, no murmurs, rubs, clicks or gallops Extremities - peripheral pulses normal, no pedal edema, no clubbing or cyanosis  CMP Latest Ref Rng & Units 09/20/2019 04/26/2018 04/02/2018  Glucose 65 - 99 mg/dL 110(H) 105(H) 93  BUN 6 - 24 mg/dL 18 <5(L) 11  Creatinine 0.76 - 1.27 mg/dL 1.08 1.03 1.04  Sodium 134 - 144 mmol/L 140 139 141  Potassium 3.5 - 5.2 mmol/L 3.6 3.7 4.1  Chloride 96 - 106 mmol/L 101 106 98  CO2 20 - 29 mmol/L 25 25 26   Calcium 8.7 - 10.2 mg/dL 9.8 9.5 10.5(H)  Total Protein 6.0 - 8.5 g/dL 7.3 7.3 -  Total Bilirubin 0.0 - 1.2 mg/dL 0.4 0.5 -  Alkaline Phos 48 - 121 IU/L 76 63 -  AST 0 - 40 IU/L 13 20 -  ALT 0 - 44 IU/L 18 21 -   Lipid Panel     Component Value Date/Time   CHOL 134 12/07/2019 1115   TRIG 77 12/07/2019 1115   HDL 48 12/07/2019 1115   CHOLHDL 2.8 12/07/2019 1115   CHOLHDL 4.2 03/09/2014 1241   VLDL 36 03/09/2014 1241   LDLCALC 71 12/07/2019 1115    CBC    Component Value Date/Time   WBC 6.7 09/20/2019 1647   WBC 5.7 12/27/2016 1424   RBC 5.33 09/20/2019 1647   RBC 4.92 12/27/2016 1424   HGB 16.3 09/20/2019 1647   HCT  47.3 09/20/2019 1647   PLT 290 09/20/2019 1647   MCV 89 09/20/2019 1647   MCH 30.6 09/20/2019 1647   MCH 30.5 12/27/2016 1424   MCHC 34.5 09/20/2019 1647   MCHC 33.6 12/27/2016 1424   RDW 13.9 09/20/2019 1647   LYMPHSABS 1.9 04/30/2014 1246   MONOABS 1.9 (H) 04/30/2014 1246   EOSABS 0.0 04/30/2014 1246   BASOSABS 0.0 04/30/2014 1246    ASSESSMENT AND PLAN: 1. Essential hypertension At goal.  Continue hydrochlorothiazide. - hydrochlorothiazide (HYDRODIURIL) 25 MG tablet; Take 1 tablet (25 mg total) by mouth daily. Must have office visit for refills  Dispense: 90 tablet; Refill: 1  2. Mixed hyperlipidemia Continue atorvastatin.  3. Tobacco use Continue to encourage him to quit.  He is aware of health risks associated with smoking.  Not quite ready to give a trial of quitting.  4. Tingling of upper extremity - Vitamin B12; Future  5. Class 2 severe obesity due to excess calories with serious comorbidity and body mass index (BMI) of 37.0 to 37.9 in adult [facility]) Commended him on getting back in the gym.  Encouraged him to continue regular exercise.  Discussed on encourage healthy eating habits.     Patient was given the opportunity to ask questions.  Patient verbalized understanding of the plan and was able to repeat key elements of the plan.   Orders Placed This Encounter  Procedures  . Vitamin B12     Requested Prescriptions   Signed Prescriptions Disp Refills  . hydrochlorothiazide (HYDRODIURIL) 25 MG tablet 90 tablet 1    Sig: Take 1 tablet (25 mg total) by mouth daily. Must have office  visit for refills    Return in about 4 months (around 08/08/2020).  Karle Plumber, MD, FACP

## 2020-04-11 ENCOUNTER — Other Ambulatory Visit: Payer: Managed Care, Other (non HMO)

## 2020-04-14 ENCOUNTER — Other Ambulatory Visit: Payer: Self-pay

## 2020-04-14 ENCOUNTER — Ambulatory Visit: Payer: Managed Care, Other (non HMO) | Attending: Internal Medicine

## 2020-04-14 DIAGNOSIS — R202 Paresthesia of skin: Secondary | ICD-10-CM

## 2020-04-15 LAB — VITAMIN B12: Vitamin B-12: 586 pg/mL (ref 232–1245)

## 2020-04-27 MED FILL — ATORVASTATIN CALCIUM 20 MG: 20 | 30 days supply | Qty: 30 | Fill #7

## 2020-04-27 MED FILL — ACYCLOVIR 400 MG TABLET: 400 | 30 days supply | Qty: 60 | Fill #5

## 2020-05-05 ENCOUNTER — Other Ambulatory Visit: Payer: Self-pay | Admitting: Internal Medicine

## 2020-05-05 MED ORDER — ATORVASTATIN CALCIUM 20 MG PO TABS: 20.0000 mg | ORAL_TABLET | Freq: Every day | ORAL | 3 refills | Status: DC

## 2020-05-05 MED ORDER — GABAPENTIN 300 MG PO CAPS: 300.0000 mg | ORAL_CAPSULE | Freq: Every day | ORAL | 3 refills | Status: DC

## 2020-05-05 MED FILL — HYDROCHLOROTHIAZIDE 25 MG T: 25 | 30 days supply | Qty: 30 | Fill #0

## 2020-05-05 MED FILL — GABAPENTIN 300 MG CAPSULE: 300 | 30 days supply | Qty: 30 | Fill #0

## 2020-05-05 MED FILL — TAMSULOSIN HCL 0.4 MG CAP: 0.4 | 30 days supply | Qty: 30 | Fill #1

## 2020-05-29 ENCOUNTER — Other Ambulatory Visit: Payer: Self-pay

## 2020-05-29 MED FILL — Atorvastatin Calcium Tab 20 MG (Base Equivalent): ORAL | 30 days supply | Qty: 30 | Fill #0 | Status: AC

## 2020-05-29 MED FILL — Acyclovir Tab 400 MG: ORAL | 30 days supply | Qty: 60 | Fill #0 | Status: AC

## 2020-06-08 ENCOUNTER — Other Ambulatory Visit: Payer: Self-pay

## 2020-06-08 ENCOUNTER — Other Ambulatory Visit: Payer: Self-pay | Admitting: Internal Medicine

## 2020-06-08 DIAGNOSIS — N400 Enlarged prostate without lower urinary tract symptoms: Secondary | ICD-10-CM

## 2020-06-08 MED ORDER — TAMSULOSIN HCL 0.4 MG PO CAPS
ORAL_CAPSULE | Freq: Every day | ORAL | 1 refills | Status: DC
  Filled 2020-06-08: qty 30, 30d supply, fill #0
  Filled 2020-07-07: qty 30, 30d supply, fill #1

## 2020-06-08 MED FILL — Hydrochlorothiazide Tab 25 MG: ORAL | 30 days supply | Qty: 30 | Fill #0 | Status: AC

## 2020-06-08 MED FILL — Atorvastatin Calcium Tab 20 MG (Base Equivalent): ORAL | 30 days supply | Qty: 30 | Fill #1 | Status: CN

## 2020-06-19 ENCOUNTER — Ambulatory Visit (HOSPITAL_COMMUNITY)
Admission: EM | Admit: 2020-06-19 | Discharge: 2020-06-19 | Disposition: A | Discharge status: Home / Self Care | Payer: Managed Care, Other (non HMO) | Attending: Family Medicine | Admitting: Family Medicine

## 2020-06-19 ENCOUNTER — Other Ambulatory Visit: Payer: Self-pay

## 2020-06-19 ENCOUNTER — Encounter (HOSPITAL_COMMUNITY): Payer: Self-pay

## 2020-06-19 DIAGNOSIS — R059 Cough, unspecified: Secondary | ICD-10-CM | POA: Diagnosis not present

## 2020-06-19 DIAGNOSIS — R062 Wheezing: Secondary | ICD-10-CM

## 2020-06-19 MED ORDER — PREDNISONE 20 MG PO TABS
40.0000 mg | ORAL_TABLET | Freq: Every day | ORAL | 0 refills | Status: DC
Start: 2020-06-19 — End: 2022-01-05
  Filled 2020-06-19: qty 10, 5d supply, fill #0

## 2020-06-19 NOTE — ED Triage Notes (Addendum)
Pt in with c/o productive cough and wheezing that has been going on since Friday. Pt states he has also been having headaches for 3 weeks  Pt states he has been taking medication for congestion

## 2020-06-19 NOTE — ED Provider Notes (Signed)
[facility]   [id number] 06/19/20 Arrival Time: 3810  ASSESSMENT & PLAN:  1. Cough   2. Wheezing     No indication for chest imaging. Likely viral illness as trigger. Discussed. OTC symptom care as needed.  Begin: Meds ordered this encounter  Medications  . predniSONE (DELTASONE) 20 MG tablet    Sig: Take 2 tablets (40 mg total) by mouth daily.    Dispense:  10 tablet    Refill:  0     Follow-up Information    Ladell Pier, MD.   Specialty: Internal Medicine Why: As needed. Contact information: [place] [address] [facility].   Specialty: Urgent Care Why: If worsening or failing to improve as anticipated. Contact information: [employer] [phone]              Reviewed expectations re: course of current medical issues. Questions answered. Outlined signs and symptoms indicating need for more acute intervention. Understanding verbalized. After Visit Summary given.   SUBJECTIVE: History from: patient. Samuel Irwin is a 60 y.o. male who reports coughing and wheezing; abrupt onset 2-3 d ago. No specific SOB. Worse with exertion. Mild fatigue and body aches. Afebrile. Denies: chest pain. Normal PO intake without n/v/d.  Social History   Tobacco Use  Smoking Status Current Some Day Smoker  . Packs/day: 0.25  . Years: 43.00  . Pack years: 10.75  . Types: Cigarettes  Smokeless Tobacco Never Used  Tobacco Comment   occas still smokes when he drinks     OBJECTIVE:  Vitals:   06/19/20 1019 06/19/20 1021  BP:  139/81  Pulse: 65   Resp: 19   Temp: 98.2 F (36.8 C)   SpO2: 94%     General appearance: alert; no distress Eyes: PERRLA; EOMI; conjunctiva normal HENT: [place]; AT; without nasal congestion Neck: supple  Lungs: speaks full sentences without difficulty; unlabored; bilateral exp wheezes present Extremities: no  edema Skin: warm and dry Neurologic: normal gait Psychological: alert and cooperative; normal mood and affect   No Known Allergies  Past Medical History:  Diagnosis Date  . Constipation   . Cough   . Generalized headaches   . GERD (gastroesophageal reflux disease)   . H/O hiatal hernia   . Heart murmur   . Herpes   . Hypertension   . Sleep apnea    uses BIPAP  . Umbilical hernia   . Weakness   . Wheezing    Social History   Socioeconomic History  . Marital status: Single    Spouse name: Not on file  . Number of children: Not on file  . Years of education: Not on file  . Highest education level: Not on file  Occupational History  . Occupation: unemployed  Tobacco Use  . Smoking status: Current Some Day Smoker    Packs/day: 0.25    Years: 43.00    Pack years: 10.75    Types: Cigarettes  . Smokeless tobacco: Never Used  . Tobacco comment: occas still smokes when he drinks  Vaping Use  . Vaping Use: Never used  Substance and Sexual Activity  . Alcohol use: Yes    Comment: once weekly  . Drug use: Not Currently    Types: Cocaine, "Crack" cocaine    Comment: smoked crack  . Sexual activity: Not Currently    Comment: "when I drink"  Other Topics Concern  . Not on file  Social History Narrative  . Not on file   Social Determinants of Health   Financial Resource Strain: Not on file  Food Insecurity: Not on file  Transportation Needs: Not on file  Physical Activity: Not on file  Stress: Not on file  Social Connections: Not on file  Intimate Partner Violence: Not on file   Family History  Problem Relation Age of Onset  . Hypertension Mother   . Cancer Father        lung  . Colon cancer Neg Hx    Past Surgical History:  Procedure Laterality Date  . BIOPSY  10/18/2014   Procedure: BIOPSY (Gastric);  Surgeon: Danie Binder, MD;  Location: [facility];  Service: Endoscopy;;  . CARPAL TUNNEL RELEASE     R hand  . COLONOSCOPY WITH PROPOFOL N/A 10/18/2014    [id number]:[id number] size external hemorrhoids/left colon is redundant  . ESOPHAGOGASTRODUODENOSCOPY (EGD) WITH PROPOFOL N/A 10/18/2014   SLF: epigastric pain due to moderate NSAID gastritis  . FEMUR SURGERY     left  . HERNIA REPAIR  04/07/12   umb hernia repair  . INSERTION OF MESH N/A 04/07/2012   Procedure: INSERTION OF MESH;  Surgeon: Madilyn Hook, DO;  Location: [facility];  Service: General;  Laterality: N/A;  . KNEE ARTHROSCOPY     right  . MINOR CARPAL TUNNEL Left 01/31/2016   Procedure: MINOR CARPAL TUNNEL RELEASE LEFT;  Surgeon: Charlotte Crumb, MD;  Location: [facility];  Service: Orthopedics;  Laterality: Left;  Local  . TOE SURGERY     dislocated lt foot  . UMBILICAL HERNIA REPAIR N/A 04/07/2012   Procedure: HERNIA REPAIR UMBILICAL ADULT;  Surgeon: Madilyn Hook, DO;  Location: [facility];  Service: General;  Laterality: N/A;  open umbilical hernia repair with mesh     Vanessa Kick, MD 06/19/20 1252

## 2020-06-29 ENCOUNTER — Other Ambulatory Visit: Payer: Self-pay | Admitting: Internal Medicine

## 2020-06-29 ENCOUNTER — Other Ambulatory Visit: Payer: Self-pay

## 2020-06-29 MED ORDER — ACYCLOVIR 400 MG PO TABS
ORAL_TABLET | ORAL | 6 refills | Status: DC
  Filled 2020-06-29: qty 60, 30d supply, fill #0
  Filled 2020-08-01: qty 60, 30d supply, fill #1
  Filled 2020-09-01: qty 60, 30d supply, fill #2
  Filled 2020-09-28: qty 60, 30d supply, fill #3
  Filled 2020-10-31: qty 60, 30d supply, fill #4
  Filled 2020-12-01: qty 60, 30d supply, fill #5
  Filled 2021-01-01: qty 60, 30d supply, fill #6

## 2020-06-29 MED FILL — Atorvastatin Calcium Tab 20 MG (Base Equivalent): ORAL | 30 days supply | Qty: 30 | Fill #1 | Status: AC

## 2020-06-30 ENCOUNTER — Other Ambulatory Visit: Payer: Self-pay | Admitting: Internal Medicine

## 2020-06-30 ENCOUNTER — Other Ambulatory Visit: Payer: Self-pay

## 2020-06-30 ENCOUNTER — Encounter: Payer: Self-pay | Admitting: Internal Medicine

## 2020-06-30 MED ORDER — OMEPRAZOLE 20 MG PO CPDR
20.0000 mg | DELAYED_RELEASE_CAPSULE | Freq: Every day | ORAL | 3 refills | Status: DC
  Filled 2020-06-30 – 2020-08-01 (×2): qty 30, 30d supply, fill #0

## 2020-07-01 ENCOUNTER — Other Ambulatory Visit: Payer: Self-pay

## 2020-07-03 ENCOUNTER — Other Ambulatory Visit: Payer: Self-pay

## 2020-07-04 ENCOUNTER — Other Ambulatory Visit: Payer: Self-pay

## 2020-07-07 ENCOUNTER — Other Ambulatory Visit: Payer: Self-pay

## 2020-07-07 MED FILL — Hydrochlorothiazide Tab 25 MG: ORAL | 30 days supply | Qty: 30 | Fill #1 | Status: AC

## 2020-07-12 ENCOUNTER — Other Ambulatory Visit: Payer: Self-pay

## 2020-08-01 ENCOUNTER — Other Ambulatory Visit: Payer: Self-pay

## 2020-08-01 ENCOUNTER — Other Ambulatory Visit: Payer: Self-pay | Admitting: Internal Medicine

## 2020-08-01 DIAGNOSIS — N400 Enlarged prostate without lower urinary tract symptoms: Secondary | ICD-10-CM

## 2020-08-01 MED FILL — Hydrochlorothiazide Tab 25 MG: ORAL | 30 days supply | Qty: 30 | Fill #2 | Status: AC

## 2020-08-01 MED FILL — Atorvastatin Calcium Tab 20 MG (Base Equivalent): ORAL | 30 days supply | Qty: 30 | Fill #2 | Status: AC

## 2020-08-01 NOTE — Telephone Encounter (Signed)
Requested medication (s) are due for refill today: yes  Requested medication (s) are on the active medication list: yes  Last refill:  06/08/20-06/08/21 #30 1 refills  Future visit scheduled: yes in 1 week   Notes to clinic:  [facility]     Requested Prescriptions  Pending Prescriptions Disp Refills   tamsulosin (FLOMAX) 0.4 MG CAPS capsule 30 capsule 1    Sig: TAKE 1 CAPSULE (0.4 MG TOTAL) BY MOUTH DAILY.      Urology: Alpha-Adrenergic Blocker Passed - 08/01/2020  5:03 PM      Passed - Last BP in normal range    BP Readings from Last 1 Encounters:  06/19/20 139/81          Passed - Valid encounter within last 12 months    Recent Outpatient Visits           3 months ago Essential hypertension   [place], MD   5 months ago Need for zoster vaccination   [place], [name], RPH-CPP   7 months ago Need for zoster vaccination   [place], [name], RPH-CPP   7 months ago Annual physical exam   [place] Ladell Pier, MD   10 months ago Essential hypertension   [place], MD       Future Appointments             In 1 week Ladell Pier, MD [place]

## 2020-08-02 ENCOUNTER — Other Ambulatory Visit: Payer: Self-pay

## 2020-08-02 MED ORDER — TAMSULOSIN HCL 0.4 MG PO CAPS
ORAL_CAPSULE | Freq: Every day | ORAL | 0 refills | Status: DC
  Filled 2020-08-02: qty 30, 30d supply, fill #0

## 2020-08-11 ENCOUNTER — Ambulatory Visit: Payer: Managed Care, Other (non HMO) | Attending: Internal Medicine | Admitting: Internal Medicine

## 2020-08-11 ENCOUNTER — Encounter: Payer: Self-pay | Admitting: Internal Medicine

## 2020-08-11 ENCOUNTER — Other Ambulatory Visit: Payer: Self-pay

## 2020-08-11 VITALS — BP 127/81 | HR 76 | Resp 16 | Wt 225.4 lb

## 2020-08-11 DIAGNOSIS — F17210 Nicotine dependence, cigarettes, uncomplicated: Secondary | ICD-10-CM

## 2020-08-11 DIAGNOSIS — Z720 Tobacco use: Secondary | ICD-10-CM

## 2020-08-11 DIAGNOSIS — F22 Delusional disorders: Secondary | ICD-10-CM | POA: Diagnosis not present

## 2020-08-11 DIAGNOSIS — M7121 Synovial cyst of popliteal space [Baker], right knee: Secondary | ICD-10-CM

## 2020-08-11 DIAGNOSIS — M25511 Pain in right shoulder: Secondary | ICD-10-CM

## 2020-08-11 DIAGNOSIS — M25512 Pain in left shoulder: Secondary | ICD-10-CM

## 2020-08-11 DIAGNOSIS — E669 Obesity, unspecified: Secondary | ICD-10-CM

## 2020-08-11 DIAGNOSIS — I10 Essential (primary) hypertension: Secondary | ICD-10-CM

## 2020-08-11 DIAGNOSIS — G8929 Other chronic pain: Secondary | ICD-10-CM

## 2020-08-11 MED ORDER — NICOTINE POLACRILEX 2 MG MT LOZG
LOZENGE | OROMUCOSAL | 2 refills | Status: DC
  Filled 2020-08-11: qty 40, fill #0
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
## 2020-08-12 LAB — COMPREHENSIVE METABOLIC PANEL
ALT: 21 IU/L (ref 0–44)
AST: 16 IU/L (ref 0–40)
Albumin/Globulin Ratio: 1.7 (ref 1.2–2.2)
Albumin: 4.7 g/dL (ref 3.8–4.9)
Alkaline Phosphatase: 71 IU/L (ref 44–121)
BUN/Creatinine Ratio: 11 (ref 9–20)
BUN: 10 mg/dL (ref 6–24)
Bilirubin Total: 0.8 mg/dL (ref 0.0–1.2)
CO2: 24 mmol/L (ref 20–29)
Calcium: 10 mg/dL (ref 8.7–10.2)
Chloride: 100 mmol/L (ref 96–106)
Creatinine, Ser: 0.95 mg/dL (ref 0.76–1.27)
Globulin, Total: 2.7 g/dL (ref 1.5–4.5)
Glucose: 102 mg/dL — ABNORMAL HIGH (ref 65–99)
Potassium: 3.8 mmol/L (ref 3.5–5.2)
Sodium: 140 mmol/L (ref 134–144)
Total Protein: 7.4 g/dL (ref 6.0–8.5)
eGFR: 93 mL/min/{1.73_m2} (ref >59)

## 2020-08-12 LAB — CBC
Hematocrit: 44.7 % (ref 37.5–51.0)
Hemoglobin: 15.5 g/dL (ref 13.0–17.7)
MCH: 30.5 pg (ref 26.6–33.0)
MCHC: 34.7 g/dL (ref 31.5–35.7)
MCV: 88 fL (ref 79–97)
Platelets: 294 10*3/uL (ref 150–450)
RBC: 5.09 x10E6/uL (ref 4.14–5.80)
RDW: 13.6 % (ref 11.6–15.4)
WBC: 5 10*3/uL (ref 3.4–10.8)

## 2020-09-01 ENCOUNTER — Other Ambulatory Visit: Payer: Self-pay

## 2020-09-01 MED FILL — Atorvastatin Calcium Tab 20 MG (Base Equivalent): ORAL | 30 days supply | Qty: 30 | Fill #3 | Status: AC

## 2020-09-07 ENCOUNTER — Encounter: Payer: Self-pay | Admitting: Internal Medicine

## 2020-09-07 ENCOUNTER — Other Ambulatory Visit: Payer: Self-pay | Admitting: Internal Medicine

## 2020-09-07 DIAGNOSIS — N400 Enlarged prostate without lower urinary tract symptoms: Secondary | ICD-10-CM

## 2020-09-07 MED FILL — Hydrochlorothiazide Tab 25 MG: ORAL | 30 days supply | Qty: 30 | Fill #3 | Status: AC

## 2020-09-08 ENCOUNTER — Ambulatory Visit: Payer: Self-pay

## 2020-09-08 ENCOUNTER — Other Ambulatory Visit: Payer: Self-pay

## 2020-09-08 ENCOUNTER — Ambulatory Visit (INDEPENDENT_AMBULATORY_CARE_PROVIDER_SITE_OTHER): Payer: Managed Care, Other (non HMO)

## 2020-09-08 ENCOUNTER — Ambulatory Visit (INDEPENDENT_AMBULATORY_CARE_PROVIDER_SITE_OTHER): Payer: Managed Care, Other (non HMO) | Admitting: Orthopaedic Surgery

## 2020-09-08 VITALS — BP 119/80 | HR 92

## 2020-09-08 DIAGNOSIS — G8929 Other chronic pain: Secondary | ICD-10-CM

## 2020-09-08 DIAGNOSIS — M19011 Primary osteoarthritis, right shoulder: Secondary | ICD-10-CM

## 2020-09-08 DIAGNOSIS — M25511 Pain in right shoulder: Secondary | ICD-10-CM

## 2020-09-08 DIAGNOSIS — M25512 Pain in left shoulder: Secondary | ICD-10-CM | POA: Diagnosis not present

## 2020-09-08 MED ORDER — TAMSULOSIN HCL 0.4 MG PO CAPS
ORAL_CAPSULE | Freq: Every day | ORAL | 0 refills | Status: DC
Start: 2020-09-08 — End: 2020-12-01
  Filled 2020-09-08: qty 30, 30d supply, fill #0
  Filled 2020-09-28: qty 30, 30d supply, fill #1
  Filled 2020-10-31: qty 30, 30d supply, fill #2

## 2020-09-10 DIAGNOSIS — M19011 Primary osteoarthritis, right shoulder: Secondary | ICD-10-CM | POA: Insufficient documentation
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
## 2020-09-19 ENCOUNTER — Other Ambulatory Visit: Payer: Self-pay

## 2020-09-19 ENCOUNTER — Encounter (HOSPITAL_COMMUNITY): Payer: Self-pay | Admitting: Emergency Medicine

## 2020-09-19 ENCOUNTER — Ambulatory Visit (HOSPITAL_COMMUNITY)
Admission: EM | Admit: 2020-09-19 | Discharge: 2020-09-19 | Disposition: A | Discharge status: Home / Self Care | Payer: Managed Care, Other (non HMO) | Attending: Internal Medicine | Admitting: Internal Medicine

## 2020-09-19 DIAGNOSIS — Z20822 Contact with and (suspected) exposure to covid-19: Secondary | ICD-10-CM | POA: Diagnosis present

## 2020-09-19 DIAGNOSIS — J029 Acute pharyngitis, unspecified: Secondary | ICD-10-CM | POA: Diagnosis present

## 2020-09-19 DIAGNOSIS — J069 Acute upper respiratory infection, unspecified: Secondary | ICD-10-CM | POA: Diagnosis not present

## 2020-09-19 DIAGNOSIS — R509 Fever, unspecified: Secondary | ICD-10-CM

## 2020-09-19 LAB — SARS CORONAVIRUS 2 (TAT 6-24 HRS): SARS Coronavirus 2: NEGATIVE

## 2020-09-19 LAB — POCT RAPID STREP A, ED / UC: Streptococcus, Group A Screen (Direct): NEGATIVE

## 2020-09-19 NOTE — ED Provider Notes (Signed)
[place]    CSN: [id number] Arrival date & time: 09/19/20  1228      History   Chief Complaint Chief Complaint  Patient presents with  . Sore Throat  . Chills  . Headache    HPI Samuel Irwin is a 60 y.o. male.   Patient presents with 1 day history of scratchy throat, chills, headache.  Patient states that he has also had fever with T-max of 99.  Headache has now resolved after patient took Excedrin Migraine.  Patient states that he was exposed to COVID-19 from a friend.  Denies any chest pain or shortness of breath.   Sore Throat  Headache  Past Medical History:  Diagnosis Date  . Constipation   . Cough   . Generalized headaches   . GERD (gastroesophageal reflux disease)   . H/O hiatal hernia   . Heart murmur   . Herpes   . Hypertension   . Sleep apnea    uses BIPAP  . Umbilical hernia   . Weakness   . Wheezing     Patient Active Problem List   Diagnosis Date Noted  . Primary osteoarthritis, right shoulder 09/10/2020  . Chronic pain of both shoulders 12/07/2019  . Prediabetes 09/21/2019  . Paranoid state ([place]) 09/20/2019  . Tobacco use 02/10/2019  . Chronic cough 12/29/2018  . Other headache syndrome 02/12/2018  . Arthritis pain of hand 02/12/2018  . Scrotal mass 08/04/2017  . Obesity (BMI 35.0-39.9 without comorbidity) 03/10/2017  . Burning sensation of feet 09/23/2016  . Floaters in visual field, right 09/23/2016  . Osteoarthritis of right knee 02/28/2016  . Chronic bilateral low back pain 02/28/2016  . Poor dentition 01/29/2016  . Essential hypertension 06/06/2015  . BPH (benign prostatic hyperplasia) 05/04/2015  . Other seasonal allergic rhinitis 05/04/2015  . Target of perceived adverse discrimination or persecution 04/05/2015  . Hemorrhoids 02/23/2015  . Constipation 02/23/2015  . GERD (gastroesophageal reflux disease) 09/21/2014  . Carpal tunnel syndrome 08/12/2014  . Erectile dysfunction 08/20/2013  . Hyperlipidemia  08/20/2013  . Class 2 severe obesity due to excess calories with serious comorbidity and body mass index (BMI) of 37.0 to 37.9 in adult ([place]) 08/20/2013  . OSA (obstructive sleep apnea) 08/24/2012    Past Surgical History:  Procedure Laterality Date  . BIOPSY  10/18/2014   Procedure: BIOPSY (Gastric);  Surgeon: Danie Binder, MD;  Location: [facility];  Service: Endoscopy;;  . CARPAL TUNNEL RELEASE     R hand  . COLONOSCOPY WITH PROPOFOL N/A 10/18/2014   [id number] size external hemorrhoids/left colon is redundant  . ESOPHAGOGASTRODUODENOSCOPY (EGD) WITH PROPOFOL N/A 10/18/2014   SLF: epigastric pain due to moderate NSAID gastritis  . FEMUR SURGERY     left  . HERNIA REPAIR  04/07/12   umb hernia repair  . INSERTION OF MESH N/A 04/07/2012   Procedure: INSERTION OF MESH;  Surgeon: Madilyn Hook, DO;  Location: [facility];  Service: General;  Laterality: N/A;  . KNEE ARTHROSCOPY     right  . MINOR CARPAL TUNNEL Left 01/31/2016   Procedure: MINOR CARPAL TUNNEL RELEASE LEFT;  Surgeon: Charlotte Crumb, MD;  Location: [facility];  Service: Orthopedics;  Laterality: Left;  Local  . TOE SURGERY     dislocated lt foot  . UMBILICAL HERNIA REPAIR N/A 04/07/2012   Procedure: HERNIA REPAIR UMBILICAL ADULT;  Surgeon: Madilyn Hook, DO;  Location: [facility];  Service: General;  Laterality: N/A;  open umbilical hernia repair with  mesh       Home Medications    Prior to Admission medications   Medication Sig Start Date End Date Taking? Authorizing Provider  acyclovir (ZOVIRAX) 400 MG tablet TAKE 1 TABLET (400 MG TOTAL) BY MOUTH 2 (TWO) TIMES DAILY. 06/29/20 06/29/21  Ladell Pier, MD  aspirin EC 81 MG tablet Take 81 mg by mouth daily.    [provider]  atorvastatin (LIPITOR) 20 MG tablet TAKE 1 TABLET (20 MG TOTAL) BY MOUTH DAILY. 05/05/20 05/05/21  Ladell Pier, MD  azelastine (ASTELIN) 0.1 % nasal spray PLACE 2 SPRAYS INTO BOTH NOSTRILS 2 (TWO) TIMES DAILY. USE IN [facility] NOSTRIL  AS DIRECTED 01/26/20 01/25/21  Ladell Pier, MD  fluticasone (FLONASE) 50 MCG/ACT nasal spray PLACE 2 SPRAYS INTO BOTH NOSTRILS DAILY. 01/26/20   Ladell Pier, MD  gabapentin (NEURONTIN) 300 MG capsule TAKE 1 CAPSULE (300 MG TOTAL) BY MOUTH AT BEDTIME. 05/05/20 05/05/21  Ladell Pier, MD  glucosamine-chondroitin 500-400 MG tablet Take 1-2 tablets by mouth daily.    [provider]  hydrochlorothiazide (HYDRODIURIL) 25 MG tablet TAKE 1 TABLET (25 MG TOTAL) BY MOUTH DAILY. MUST HAVE OFFICE VISIT FOR REFILLS 04/10/20 04/10/21  Ladell Pier, MD  ibuprofen (ADVIL) 800 MG tablet Take 800 mg by mouth as needed for moderate pain.    [provider]  meclizine (ANTIVERT) 25 MG tablet Take 1 tablet (25 mg total) by mouth 3 (three) times daily as needed for dizziness. 09/08/19   Charlott Rakes, MD  nicotine polacrilex (NICORETTE MINI) 2 MG lozenge Use as needed to quit smoking 08/11/20   Ladell Pier, MD  omeprazole (PRILOSEC) 20 MG capsule Take 1 capsule (20 mg total) by mouth daily. 06/30/20   Ladell Pier, MD  predniSONE (DELTASONE) 20 MG tablet Take 2 tablets (40 mg total) by mouth daily. 06/19/20   Vanessa Kick, MD  tadalafil (CIALIS) 10 MG tablet Take 10 mg by mouth daily as needed for erectile dysfunction.    [provider]  tamsulosin (FLOMAX) 0.4 MG CAPS capsule TAKE 1 CAPSULE (0.4 MG TOTAL) BY MOUTH DAILY. 09/08/20 09/08/21  Ladell Pier, MD    Family History Family History  Problem Relation Age of Onset  . Hypertension Mother   . Cancer Father        lung  . Colon cancer Neg Hx     Social History Social History   Tobacco Use  . Smoking status: Some Days    Packs/day: 0.25    Years: 43.00    Pack years: 10.75    Types: Cigarettes  . Smokeless tobacco: Never  . Tobacco comments:    occas still smokes when he drinks  Vaping Use  . Vaping Use: Never used  Substance Use Topics  . Alcohol use: Yes    Comment: once weekly  .  Drug use: Not Currently    Types: Cocaine, "Crack" cocaine    Comment: smoked crack     Allergies   Patient has no known allergies.   Review of Systems Review of Systems Per HPI  Physical Exam Triage Vital Signs ED Triage Vitals  Enc Vitals Group     BP 09/19/20 1458 126/87     Pulse Rate 09/19/20 1458 (!) 58     Resp 09/19/20 1458 17     Temp 09/19/20 1458 98.3 F (36.8 C)     Temp Source 09/19/20 1458 Oral     SpO2 09/19/20 1458 98 %  Weight --      Height --      Head Circumference --      Peak Flow --      Pain Score 09/19/20 1457 0     Pain Loc --      Pain Edu? --      Excl. in [place]? --    No data found.  Updated Vital Signs BP 126/87 (BP Location: Left Arm)   Pulse 65   Temp 98.3 F (36.8 C) (Oral)   Resp 17   SpO2 98%   Visual Acuity Right Eye Distance:   Left Eye Distance:   Bilateral Distance:    Right Eye Near:   Left Eye Near:    Bilateral Near:     Physical Exam Constitutional:      General: He is not in acute distress.    Appearance: Normal appearance.  HENT:     Head: Normocephalic and atraumatic.     Right Ear: Tympanic membrane and ear canal normal.     Left Ear: Tympanic membrane and ear canal normal.     Nose: Congestion present.     Mouth/Throat:     Pharynx: Posterior oropharyngeal erythema present.  Eyes:     Extraocular Movements: Extraocular movements intact.     Conjunctiva/sclera: Conjunctivae normal.  Cardiovascular:     Rate and Rhythm: Normal rate and regular rhythm.     Pulses: Normal pulses.     Heart sounds: Normal heart sounds.  Pulmonary:     Effort: Pulmonary effort is normal. No respiratory distress.     Breath sounds: Normal breath sounds. No wheezing, rhonchi or rales.  Skin:    General: Skin is warm and dry.  Neurological:     General: No focal deficit present.     Mental Status: He is alert and oriented to person, place, and time. Mental status is at baseline.  Psychiatric:        Mood and Affect:  Mood normal.        Behavior: Behavior normal.        Thought Content: Thought content normal.        Judgment: Judgment normal.     UC Treatments / Results  Labs (all labs ordered are listed, but only abnormal results are displayed) Labs Reviewed  CULTURE, GROUP A STREP ([place])  SARS CORONAVIRUS 2 (TAT 6-24 HRS)  POCT RAPID STREP A, ED / UC    EKG   Radiology No results found.  Procedures Procedures (including critical care time)  Medications Ordered in UC Medications - No data to display  Initial Impression / Assessment and Plan / UC Course  I have reviewed the triage vital signs and the nursing notes.  Pertinent labs & imaging results that were available during my care of the patient were reviewed by me and considered in my medical decision making (see chart for details).     Highly suspicious of COVID-19 due to exposure.  Patient presents with symptoms likely from a viral upper respiratory infection. Differential includes bacterial pneumonia, sinusitis, allergic rhinitis, Covid 19. Do not suspect underlying cardiopulmonary process. Symptoms seem unlikely related to ACS, CHF or COPD exacerbations, pneumonia, pneumothorax. Patient is nontoxic appearing and not in need of emergent medical intervention.  Recommended symptom control with over the counter medications: Daily oral anti-histamine, Oral decongestant or IN corticosteroid, saline irrigations, cepacol lozenges, Robitussin, Delsym, honey tea.  Return if symptoms fail to improve in 1-2 weeks or you develop shortness of breath,  chest pain, severe headache. Patient states understanding and is agreeable.  Discharged with PCP followup.  Final Clinical Impressions(s) / UC Diagnoses   Final diagnoses:  Viral upper respiratory tract infection  Exposure to COVID-19 virus  Sore throat  Fever, unspecified     Discharge Instructions      You likely having a viral upper respiratory infection. We recommended symptom  control. I expect your symptoms to start improving in the next 1-2 weeks.   1. Take a daily allergy pill/anti-histamine like Zyrtec, Claritin, or Store brand consistently for 2 weeks  2. For congestion you may try an oral decongestant like Mucinex or sudafed. You may also try intranasal flonase nasal spray or saline irrigations (neti pot, sinus cleanse)  3. For your sore throat you may try cepacol lozenges, salt water gargles, throat spray. Treatment of congestion may also help your sore throat.  4. For cough you may try Robitussen, Mucinex DM  5. Take Tylenol or Ibuprofen to help with pain/inflammation  6. Stay hydrated, drink plenty of fluids to keep throat coated and less irritated  Honey Tea For cough/sore throat try using a honey-based tea. Use 3 teaspoons of honey with juice squeezed from half lemon. Place shaved pieces of ginger into 1/2-1 cup of water and warm over stove top. Then mix the ingredients and repeat every 4 hours as needed.   Rapid strep test was negative in urgent care.  Throat culture and COVID-19 viral swab are pending.  We will call if these are positive.     ED Prescriptions   None    PDMP not reviewed this encounter.   Odis Luster, [place] 09/19/20 [phone]

## 2020-09-19 NOTE — Discharge Instructions (Addendum)
You likely having a viral upper respiratory infection. We recommended symptom control. I expect your symptoms to start improving in the next 1-2 weeks.   1. Take a daily allergy pill/anti-histamine like Zyrtec, Claritin, or Store brand consistently for 2 weeks  2. For congestion you may try an oral decongestant like Mucinex or sudafed. You may also try intranasal flonase nasal spray or saline irrigations (neti pot, sinus cleanse)  3. For your sore throat you may try cepacol lozenges, salt water gargles, throat spray. Treatment of congestion may also help your sore throat.  4. For cough you may try Robitussen, Mucinex DM  5. Take Tylenol or Ibuprofen to help with pain/inflammation  6. Stay hydrated, drink plenty of fluids to keep throat coated and less irritated  Honey Tea For cough/sore throat try using a honey-based tea. Use 3 teaspoons of honey with juice squeezed from half lemon. Place shaved pieces of ginger into 1/2-1 cup of water and warm over stove top. Then mix the ingredients and repeat every 4 hours as needed.   Rapid strep test was negative in urgent care.  Throat culture and COVID-19 viral swab are pending.  We will call if these are positive.

## 2020-09-19 NOTE — ED Triage Notes (Signed)
Pt presents with scratchy throat, chills, and headache xs 1 day.

## 2020-09-21 LAB — CULTURE, GROUP A STREP (THRC)

## 2020-09-28 ENCOUNTER — Other Ambulatory Visit: Payer: Self-pay

## 2020-09-28 ENCOUNTER — Ambulatory Visit: Payer: Managed Care, Other (non HMO) | Attending: Nurse Practitioner | Admitting: Nurse Practitioner

## 2020-09-28 VITALS — BP 116/80 | HR 88 | Temp 97.9°F | Resp 18

## 2020-09-28 DIAGNOSIS — R1031 Right lower quadrant pain: Secondary | ICD-10-CM | POA: Diagnosis not present

## 2020-09-28 MED ORDER — PREDNISONE 20 MG PO TABS
20.0000 mg | ORAL_TABLET | Freq: Every day | ORAL | 0 refills | Status: AC
  Filled 2020-09-28: qty 5, 5d supply, fill #0

## 2020-09-28 MED FILL — Atorvastatin Calcium Tab 20 MG (Base Equivalent): ORAL | 30 days supply | Qty: 30 | Fill #4 | Status: AC

## 2020-09-28 MED FILL — Hydrochlorothiazide Tab 25 MG: ORAL | 30 days supply | Qty: 30 | Fill #4 | Status: AC

## 2020-09-28 NOTE — Progress Notes (Signed)
$'@Patient'E$  ID: Samuel Irwin, male    DOB: 02/13/1961, 60 y.o.   MRN: [id number]  Chief Complaint  Patient presents with   Abdominal Pain     Referring provider: Ladell Pier, MD   HPI  Patient presents today for an acute visit.  Patient states that for the last couple weeks he has been having right lower quadrant abdominal pain.  He states that at times he does have a sharp pain to his right lower side upon standing or moving.  There is no notable bulge or swelling at the site.  Upon exam the pain seems to be more in the area of the hip joint rather than abdomen.  We will check a UA today.  Patient denies any recent injury or fall.  Patient states that he has been having regular bowel movements.  Patient states that he does eat a high-fiber diet.  Patient notes that he has had some episodes of diarrhea over the past couple days.  Patient denies any nausea or vomiting.       No Known Allergies  Immunization History  Administered Date(s) Administered   Influenza Split 11/25/2016   Influenza,inj,Quad PF,6+ Mos 12/22/2013, 01/06/2015, 10/19/2015, 11/10/2017, 10/30/2018, 12/07/2019   Influenza-Unspecified 10/01/2018   Pneumococcal Polysaccharide-23 12/22/2013   Tdap 09/19/2015   Zoster Recombinat (Shingrix) 12/17/2019, 02/17/2020    Past Medical History:  Diagnosis Date   Constipation    Cough    Generalized headaches    GERD (gastroesophageal reflux disease)    H/O hiatal hernia    Heart murmur    Herpes    Hypertension    Sleep apnea    uses BIPAP   Umbilical hernia    Weakness    Wheezing     Tobacco History: Social History   Tobacco Use  Smoking Status Some Days   Packs/day: 0.25   Years: 43.00   Pack years: 10.75   Types: Cigarettes  Smokeless Tobacco Never  Tobacco Comments   occas still smokes when he drinks   Ready to quit: No Counseling given: Yes Tobacco comments: occas still smokes when he drinks   Outpatient Encounter Medications as  of 09/28/2020  Medication Sig   predniSONE (DELTASONE) 20 MG tablet Take 1 tablet (20 mg total) by mouth daily with breakfast for 5 days.   acyclovir (ZOVIRAX) 400 MG tablet TAKE 1 TABLET (400 MG TOTAL) BY MOUTH 2 (TWO) TIMES DAILY.   aspirin EC 81 MG tablet Take 81 mg by mouth daily.   atorvastatin (LIPITOR) 20 MG tablet TAKE 1 TABLET (20 MG TOTAL) BY MOUTH DAILY.   azelastine (ASTELIN) 0.1 % nasal spray PLACE 2 SPRAYS INTO BOTH NOSTRILS 2 (TWO) TIMES DAILY. USE IN EACH NOSTRIL AS DIRECTED   fluticasone (FLONASE) 50 MCG/ACT nasal spray PLACE 2 SPRAYS INTO BOTH NOSTRILS DAILY.   gabapentin (NEURONTIN) 300 MG capsule TAKE 1 CAPSULE (300 MG TOTAL) BY MOUTH AT BEDTIME.   glucosamine-chondroitin 500-400 MG tablet Take 1-2 tablets by mouth daily.   hydrochlorothiazide (HYDRODIURIL) 25 MG tablet TAKE 1 TABLET (25 MG TOTAL) BY MOUTH DAILY. MUST HAVE OFFICE VISIT FOR REFILLS   ibuprofen (ADVIL) 800 MG tablet Take 800 mg by mouth as needed for moderate pain.   meclizine (ANTIVERT) 25 MG tablet Take 1 tablet (25 mg total) by mouth 3 (three) times daily as needed for dizziness.   nicotine polacrilex (NICORETTE MINI) 2 MG lozenge Use as needed to quit smoking   omeprazole (PRILOSEC) 20 MG capsule Take 1  capsule (20 mg total) by mouth daily.   predniSONE (DELTASONE) 20 MG tablet Take 2 tablets (40 mg total) by mouth daily.   tadalafil (CIALIS) 10 MG tablet Take 10 mg by mouth daily as needed for erectile dysfunction.   tamsulosin (FLOMAX) 0.4 MG CAPS capsule TAKE 1 CAPSULE (0.4 MG TOTAL) BY MOUTH DAILY.   No facility-administered encounter medications on file as of 09/28/2020.     Review of Systems  Review of Systems  Constitutional: Negative.   HENT: Negative.    Respiratory:  Negative for cough and shortness of breath.   Cardiovascular: Negative.   Gastrointestinal:  Positive for diarrhea. Negative for abdominal distention, abdominal pain, constipation, rectal pain and vomiting.   Allergic/Immunologic: Negative.   Neurological: Negative.   Psychiatric/Behavioral: Negative.        Physical Exam  BP 116/80   Pulse 88   Temp 97.9 F (36.6 C)   Resp 18   SpO2 97%   Wt Readings from Last 5 Encounters:  08/11/20 225 lb 6.4 oz (102.2 kg)  04/10/20 244 lb 3.2 oz (110.8 kg)  12/10/19 237 lb (107.5 kg)  12/07/19 242 lb 9.6 oz (110 kg)  11/12/19 238 lb (108 kg)     Physical Exam Vitals and nursing note reviewed.  Constitutional:      General: He is not in acute distress.    Appearance: He is well-developed.  Cardiovascular:     Rate and Rhythm: Normal rate and regular rhythm.  Pulmonary:     Effort: Pulmonary effort is normal.     Breath sounds: Normal breath sounds.  Musculoskeletal:       Legs:     Comments: Tender to palpation at hip joint  Skin:    General: Skin is warm and dry.  Neurological:     Mental Status: He is alert and oriented to person, place, and time.     Lab Results:  CBC    Component Value Date/Time   WBC 5.0 08/11/2020 1416   WBC 5.7 12/27/2016 1424   RBC 5.09 08/11/2020 1416   RBC 4.92 12/27/2016 1424   HGB 15.5 08/11/2020 1416   HCT 44.7 08/11/2020 1416   PLT 294 08/11/2020 1416   MCV 88 08/11/2020 1416   MCH 30.5 08/11/2020 1416   MCH 30.5 12/27/2016 1424   MCHC 34.7 08/11/2020 1416   MCHC 33.6 12/27/2016 1424   RDW 13.6 08/11/2020 1416   LYMPHSABS 1.9 04/30/2014 1246   MONOABS 1.9 (H) 04/30/2014 1246   EOSABS 0.0 04/30/2014 1246   BASOSABS 0.0 04/30/2014 1246    BMET    Component Value Date/Time   NA 140 08/11/2020 1416   K 3.8 08/11/2020 1416   CL 100 08/11/2020 1416   CO2 24 08/11/2020 1416   GLUCOSE 102 (H) 08/11/2020 1416   GLUCOSE 105 (H) 04/26/2018 1516   BUN 10 08/11/2020 1416   CREATININE 0.95 08/11/2020 1416   CREATININE 1.09 09/19/2015 1242   CALCIUM 10.0 08/11/2020 1416   GFRNONAA 76 09/20/2019 1647   GFRNONAA 77 09/19/2015 1242   GFRAA 88 09/20/2019 1647   GFRAA 89 09/19/2015 1242     BNP No results found for: BNP  ProBNP No results found for: PROBNP  Imaging: XR Shoulder Left  Result Date: 09/10/2020 Three-view x-rays left shoulder obtained and reviewed.  This shows subacromial spur.  No glenohumeral arthritis.  Negative for acute changes. Impression: Subacromial spur that could contribute to impingement.  Normal glenohumeral joint.  XR Shoulder Right  Result Date: 09/10/2020 Three-view x-rays right shoulder obtained and reviewed.  This shows osteoarthritis with cystic changes in the humeral head marginal osteophytes on both sides of the joint without joint subluxation. Impression: Moderate right shoulder osteoarthritis.    Assessment & Plan:   Right lower quadrant abdominal pain Right Lower abdominal pain - pain is located in the right hip joint upon exam - will consider muscle strain - no signs of hernia - will check UA Diarrhea:  Will check UA  Stay active  Bland diet  No heavy lifting  Follow up:  Follow up if needed     Fenton Foy, NP 09/29/2020

## 2020-09-28 NOTE — Patient Instructions (Addendum)
Right Lower abdominal pain - pain is located in the right hip joint upon exam - will consider muscle strain - no signs of hernia - will check UA Diarrhea:  Will check UA  Stay active  Bland diet  No heavy lifting  Follow up:  Follow up if needed  Diarrhea, Adult Diarrhea is when you pass loose and watery poop (stool) often. Diarrhea can make you feel weak and cause you to lose water in your body (get dehydrated). Losing water in your body can cause you to: Feel tired and thirsty. Have a dry mouth. Go pee (urinate) less often. Diarrhea often lasts 2-3 days. However, it can last longer if it is a sign of something more serious. It is important to treat your diarrhea as told by yourdoctor. Follow these instructions at home: Eating and drinking     Follow these instructions as told by your doctor: Take an ORS (oral rehydration solution). This is a drink that helps you replace fluids and minerals your body lost. It is sold at pharmacies and stores. Drink plenty of fluids, such as: Water. Ice chips. Diluted fruit juice. Low-calorie sports drinks. Milk, if you want. Avoid drinking fluids that have a lot of sugar or caffeine in them. Eat bland, easy-to-digest foods in small amounts as you are able. These foods include: Bananas. Applesauce. Rice. Low-fat (lean) meats. Toast. Crackers. Avoid alcohol. Avoid spicy or fatty foods.  Medicines Take over-the-counter and prescription medicines only as told by your doctor. If you were prescribed an antibiotic medicine, take it as told by your doctor. Do not stop using the antibiotic even if you start to feel better. General instructions  Wash your hands often using soap and water. If soap and water are not available, use a hand sanitizer. Others in your home should wash their hands as well. Hands should be washed: After using the toilet or changing a diaper. Before preparing, cooking, or serving food. While caring for a sick  person. While visiting someone in a hospital. Drink enough fluid to keep your pee (urine) pale yellow. Rest at home while you get better. Watch your condition for any changes. Take a warm bath to help with any burning or pain from having diarrhea. Keep all follow-up visits as told by your doctor. This is important.  Contact a doctor if: You have a fever. Your diarrhea gets worse. You have new symptoms. You cannot keep fluids down. You feel light-headed or dizzy. You have a headache. You have muscle cramps. Get help right away if: You have chest pain. You feel very weak or you pass out (faint). You have bloody or black poop or poop that looks like tar. You have very bad pain, cramping, or bloating in your belly (abdomen). You have trouble breathing or you are breathing very quickly. Your heart is beating very quickly. Your skin feels cold and clammy. You feel confused. You have signs of losing too much water in your body, such as: Dark pee, very little pee, or no pee. Cracked lips. Dry mouth. Sunken eyes. Sleepiness. Weakness. Summary Diarrhea is when you pass loose and watery poop (stool) often. Diarrhea can make you feel weak and cause you to lose water in your body (get dehydrated). Take an ORS (oral rehydration solution). This is a drink that is sold at pharmacies and stores. Eat bland, easy-to-digest foods in small amounts as you are able. Contact a doctor if your condition gets worse. Get help right away if you have signs that  you have lost too much water in your body. This information is not intended to replace advice given to you by your health care provider. Make sure you discuss any questions you have with your healthcare provider. Document Revised: 07/18/2017 Document Reviewed: 07/18/2017 Elsevier Patient Education  [place].

## 2020-09-29 ENCOUNTER — Encounter: Payer: Self-pay | Admitting: Nurse Practitioner

## 2020-09-29 DIAGNOSIS — R1031 Right lower quadrant pain: Secondary | ICD-10-CM | POA: Insufficient documentation

## 2020-09-29 LAB — URINALYSIS
Bilirubin, UA: NEGATIVE
Glucose, UA: NEGATIVE
Ketones, UA: NEGATIVE
Leukocytes,UA: NEGATIVE
Nitrite, UA: NEGATIVE
Protein,UA: NEGATIVE
RBC, UA: NEGATIVE
Specific Gravity, UA: 1.017 (ref 1.005–1.030)
Urobilinogen, Ur: 0.2 mg/dL (ref 0.2–1.0)
pH, UA: 5.5 (ref 5.0–7.5)

## 2020-09-29 NOTE — Assessment & Plan Note (Signed)
Right Lower abdominal pain - pain is located in the right hip joint upon exam - will consider muscle strain - no signs of hernia - will check UA Diarrhea:  Will check UA  Stay active  Bland diet  No heavy lifting  Follow up:  Follow up if needed

## 2020-10-31 ENCOUNTER — Other Ambulatory Visit: Payer: Self-pay | Admitting: Internal Medicine

## 2020-10-31 ENCOUNTER — Other Ambulatory Visit: Payer: Self-pay

## 2020-10-31 DIAGNOSIS — I10 Essential (primary) hypertension: Secondary | ICD-10-CM

## 2020-10-31 MED FILL — Atorvastatin Calcium Tab 20 MG (Base Equivalent): ORAL | 30 days supply | Qty: 30 | Fill #5 | Status: AC

## 2020-11-01 ENCOUNTER — Other Ambulatory Visit: Payer: Self-pay

## 2020-11-01 MED ORDER — HYDROCHLOROTHIAZIDE 25 MG PO TABS
ORAL_TABLET | ORAL | 1 refills | Status: DC
  Filled 2020-11-01 – 2020-11-10 (×2): qty 30, 30d supply, fill #0
  Filled 2020-12-06: qty 30, 30d supply, fill #1
  Filled 2021-01-01: qty 30, 30d supply, fill #2
  Filled 2021-01-29: qty 30, 30d supply, fill #3
  Filled 2021-03-01: qty 30, 30d supply, fill #4
  Filled 2021-03-02 (×2): qty 30, 30d supply, fill #0
  Filled 2021-03-29: qty 30, 30d supply, fill #1

## 2020-11-01 NOTE — Telephone Encounter (Signed)
Requested Prescriptions  Pending Prescriptions Disp Refills  . hydrochlorothiazide (HYDRODIURIL) 25 MG tablet 90 tablet 1    Sig: TAKE 1 TABLET (25 MG TOTAL) BY MOUTH DAILY. MUST HAVE OFFICE VISIT FOR REFILLS     Cardiovascular: Diuretics - Thiazide Passed - 10/31/2020 11:51 AM      Passed - Ca in normal range and within 360 days    Calcium  Date Value Ref Range Status  08/11/2020 10.0 8.7 - 10.2 mg/dL Final   Calcium, Ion  Date Value Ref Range Status  02/25/2010 1.17 1.12 - 1.32 mmol/L Final         Passed - Cr in normal range and within 360 days    Creat  Date Value Ref Range Status  09/19/2015 1.09 0.70 - 1.33 mg/dL Final    Comment:      For patients > or = 58 years of age: The upper reference limit for Creatinine is approximately 13% higher for people identified as African-American.      Creatinine, Ser  Date Value Ref Range Status  08/11/2020 0.95 0.76 - 1.27 mg/dL Final   Creatinine, Urine  Date Value Ref Range Status  04/30/2014 397.06 mg/dL Final         Passed - K in normal range and within 360 days    Potassium  Date Value Ref Range Status  08/11/2020 3.8 3.5 - 5.2 mmol/L Final         Passed - Na in normal range and within 360 days    Sodium  Date Value Ref Range Status  08/11/2020 140 134 - 144 mmol/L Final         Passed - Last BP in normal range    BP Readings from Last 1 Encounters:  09/29/20 116/80         Passed - Valid encounter within last 6 months    Recent Outpatient Visits          1 month ago Right lower quadrant abdominal pain   [place], Kriste Basque, NP   2 months ago Essential hypertension   [place], MD   6 months ago Essential hypertension   [place], MD   8 months ago Need for zoster vaccination   [place], [name], RPH-CPP   10 months ago  Need for zoster vaccination   [place], RPH-CPP      Future Appointments            In 1 month [name] [name], MD [place]   In 4 months Lorin Mercy, Thana Farr, MD [facility], [facility], [facility]

## 2020-11-08 ENCOUNTER — Other Ambulatory Visit: Payer: Self-pay

## 2020-11-10 ENCOUNTER — Other Ambulatory Visit: Payer: Self-pay

## 2020-11-16 ENCOUNTER — Ambulatory Visit: Payer: Managed Care, Other (non HMO) | Attending: Internal Medicine

## 2020-11-16 ENCOUNTER — Other Ambulatory Visit: Payer: Self-pay

## 2020-11-16 DIAGNOSIS — Z23 Encounter for immunization: Secondary | ICD-10-CM | POA: Diagnosis not present

## 2020-12-01 ENCOUNTER — Other Ambulatory Visit: Payer: Self-pay | Admitting: Internal Medicine

## 2020-12-01 ENCOUNTER — Other Ambulatory Visit: Payer: Self-pay

## 2020-12-01 DIAGNOSIS — N400 Enlarged prostate without lower urinary tract symptoms: Secondary | ICD-10-CM

## 2020-12-01 MED FILL — Atorvastatin Calcium Tab 20 MG (Base Equivalent): ORAL | 30 days supply | Qty: 30 | Fill #6 | Status: AC

## 2020-12-01 NOTE — Telephone Encounter (Signed)
Requested medications are due for refill today requesting early  Requested medications are on the active medication list yes  Last refill 9/6 for 90 day supply  Last visit 09/28/20  Future visit scheduled 12/19/20  Notes to clinic requesting two months early, please assess.

## 2020-12-04 ENCOUNTER — Other Ambulatory Visit: Payer: Self-pay

## 2020-12-04 MED ORDER — TAMSULOSIN HCL 0.4 MG PO CAPS
0.4000 mg | ORAL_CAPSULE | Freq: Every day | ORAL | 0 refills | Status: DC
  Filled 2020-12-04: qty 30, 30d supply, fill #0

## 2020-12-07 ENCOUNTER — Other Ambulatory Visit: Payer: Self-pay

## 2020-12-08 ENCOUNTER — Ambulatory Visit: Payer: Managed Care, Other (non HMO) | Admitting: Internal Medicine

## 2020-12-08 ENCOUNTER — Other Ambulatory Visit: Payer: Self-pay

## 2020-12-19 ENCOUNTER — Other Ambulatory Visit: Payer: Self-pay

## 2020-12-19 ENCOUNTER — Ambulatory Visit: Payer: Managed Care, Other (non HMO) | Attending: Internal Medicine | Admitting: Internal Medicine

## 2020-12-19 ENCOUNTER — Encounter: Payer: Self-pay | Admitting: Internal Medicine

## 2020-12-19 VITALS — BP 131/80 | HR 70 | Resp 16 | Wt 231.4 lb

## 2020-12-19 DIAGNOSIS — F17210 Nicotine dependence, cigarettes, uncomplicated: Secondary | ICD-10-CM | POA: Diagnosis not present

## 2020-12-19 DIAGNOSIS — Z23 Encounter for immunization: Secondary | ICD-10-CM | POA: Diagnosis not present

## 2020-12-19 DIAGNOSIS — Z0001 Encounter for general adult medical examination with abnormal findings: Secondary | ICD-10-CM | POA: Diagnosis not present

## 2020-12-19 DIAGNOSIS — E782 Mixed hyperlipidemia: Secondary | ICD-10-CM

## 2020-12-19 DIAGNOSIS — I10 Essential (primary) hypertension: Secondary | ICD-10-CM | POA: Diagnosis not present

## 2020-12-19 DIAGNOSIS — K6289 Other specified diseases of anus and rectum: Secondary | ICD-10-CM

## 2020-12-19 DIAGNOSIS — Z6835 Body mass index (BMI) 35.0-35.9, adult: Secondary | ICD-10-CM

## 2020-12-19 DIAGNOSIS — Z0000 Encounter for general adult medical examination without abnormal findings: Secondary | ICD-10-CM

## 2020-12-19 DIAGNOSIS — Z125 Encounter for screening for malignant neoplasm of prostate: Secondary | ICD-10-CM

## 2020-12-19 DIAGNOSIS — E669 Obesity, unspecified: Secondary | ICD-10-CM

## 2020-12-19 MED ORDER — HYDROCORTISONE (PERIANAL) 2.5 % EX CREA
1.0000 "application " | TOPICAL_CREAM | Freq: Two times a day (BID) | CUTANEOUS | 0 refills | Status: DC
  Filled 2020-12-19 – 2021-10-31 (×2): qty 30, 10d supply, fill #0

## 2020-12-19 NOTE — Progress Notes (Signed)
Patient ID: Samuel Irwin, male    DOB: 04-08-1960  MRN: [id number]  CC: Annual physical  Subjective: Samuel Irwin is a 60 y.o. male who presents for annual exam His concerns today include:  [name], HL, obesity, GERD, ED, tob dep, BPH, OSA on Bipap, Vit D def, dep/anxiety, paranoia (see 08/2017 visit), OA knee, COVID 19 01/2020   C/o 3 episodes of rectal pain over past 5 mths.  Pain he states is inside the rectum.  No episode occur with BM. Last 2-3 mins Had C-scope 2016 where he was found to have internal and external hemorrhoids.  Reports having had growth clipped from rectum area a few yrs prior to c-scope. He is not sure if it was a hemorrhoidectomy. Reports noticing occasional blood when he wipes.  Marland Kitchen   HTN:  taking HCTZ and tries to limit salt.  However he states he ate some chips this morning.  Denies any chest pains, shortness of breath or lower extremity edema.Marland Kitchen  HL:  taking and tolerating Lipitor  Still smokes but not every day.  He tells me that he is still working on trying to quit.  Obesity:  has not been swimming recently.  Would like to go 3 times a wk.  Plans to get back to it at the [facility]. Does well with eating habits during the wk.  Cheats on the wk-ends.  Loves dirty rice  OSA:  using BiPAP consistently during the week but states that he takes a break from it for 2 days on the weekends.  He tells me that he just cannot handle that every night.     HM:  Had 2 COVID shots.  Due for Prevnar 20     Patient Active Problem List   Diagnosis Date Noted   Right lower quadrant abdominal pain 09/29/2020   Primary osteoarthritis, right shoulder 09/10/2020   Chronic pain of both shoulders 12/07/2019   Prediabetes 09/21/2019   Paranoid state ([place]) 09/20/2019   Tobacco use 02/10/2019   Chronic cough 12/29/2018   Other headache syndrome 02/12/2018   Arthritis pain of hand 02/12/2018   Scrotal mass 08/04/2017   Obesity (BMI 35.0-39.9 without comorbidity)  03/10/2017   Burning sensation of feet 09/23/2016   Floaters in visual field, right 09/23/2016   Osteoarthritis of right knee 02/28/2016   Chronic bilateral low back pain 02/28/2016   Poor dentition 01/29/2016   Essential hypertension 06/06/2015   BPH (benign prostatic hyperplasia) 05/04/2015   Other seasonal allergic rhinitis 05/04/2015   Target of perceived adverse discrimination or persecution 04/05/2015   Hemorrhoids 02/23/2015   Constipation 02/23/2015   GERD (gastroesophageal reflux disease) 09/21/2014   Carpal tunnel syndrome 08/12/2014   Erectile dysfunction 08/20/2013   Hyperlipidemia 08/20/2013   Class 2 severe obesity due to excess calories with serious comorbidity and body mass index (BMI) of 37.0 to 37.9 in adult ([place]) 08/20/2013   OSA (obstructive sleep apnea) 08/24/2012     Current Outpatient Medications on File Prior to Visit  Medication Sig Dispense Refill   acyclovir (ZOVIRAX) 400 MG tablet TAKE 1 TABLET (400 MG TOTAL) BY MOUTH 2 (TWO) TIMES DAILY. 60 tablet 6   aspirin EC 81 MG tablet Take 81 mg by mouth daily.     atorvastatin (LIPITOR) 20 MG tablet TAKE 1 TABLET (20 MG TOTAL) BY MOUTH DAILY. 90 tablet 3   azelastine (ASTELIN) 0.1 % nasal spray PLACE 2 SPRAYS INTO BOTH NOSTRILS 2 (TWO) TIMES DAILY. USE IN  EACH NOSTRIL AS DIRECTED 30 mL 12   fluticasone (FLONASE) 50 MCG/ACT nasal spray PLACE 2 SPRAYS INTO BOTH NOSTRILS DAILY. 16 g 2   gabapentin (NEURONTIN) 300 MG capsule TAKE 1 CAPSULE (300 MG TOTAL) BY MOUTH AT BEDTIME. 30 capsule 3   glucosamine-chondroitin 500-400 MG tablet Take 1-2 tablets by mouth daily.     hydrochlorothiazide (HYDRODIURIL) 25 MG tablet TAKE 1 TABLET (25 MG TOTAL) BY MOUTH DAILY. MUST HAVE OFFICE VISIT FOR REFILLS 90 tablet 1   ibuprofen (ADVIL) 800 MG tablet Take 800 mg by mouth as needed for moderate pain.     meclizine (ANTIVERT) 25 MG tablet Take 1 tablet (25 mg total) by mouth 3 (three) times daily as needed for dizziness. 30 tablet 0    nicotine polacrilex (NICORETTE MINI) 2 MG lozenge Use as needed to quit smoking 40 lozenge 2   omeprazole (PRILOSEC) 20 MG capsule Take 1 capsule (20 mg total) by mouth daily. 30 capsule 3   predniSONE (DELTASONE) 20 MG tablet Take 2 tablets (40 mg total) by mouth daily. 10 tablet 0   tadalafil (CIALIS) 10 MG tablet Take 10 mg by mouth daily as needed for erectile dysfunction.     tamsulosin (FLOMAX) 0.4 MG CAPS capsule Take 1 capsule (0.4 mg total) by mouth daily. 30 capsule 0   No current facility-administered medications on file prior to visit.    No Known Allergies  Social History   Socioeconomic History   Marital status: Single    Spouse name: Not on file   Number of children: Not on file   Years of education: Not on file   Highest education level: Not on file  Occupational History   Occupation: unemployed  Tobacco Use   Smoking status: Some Days    Packs/day: 0.25    Years: 43.00    Pack years: 10.75    Types: Cigarettes   Smokeless tobacco: Never   Tobacco comments:    occas still smokes when he drinks  Vaping Use   Vaping Use: Never used  Substance and Sexual Activity   Alcohol use: Yes    Comment: once weekly   Drug use: Not Currently    Types: Cocaine, "Crack" cocaine    Comment: smoked crack   Sexual activity: Not Currently    Comment: "when I drink"  Other Topics Concern   Not on file  Social History Narrative   Not on file   Social Determinants of Health   Financial Resource Strain: Not on file  Food Insecurity: Not on file  Transportation Needs: Not on file  Physical Activity: Not on file  Stress: Not on file  Social Connections: Not on file  Intimate Partner Violence: Not on file    Family History  Problem Relation Age of Onset   Hypertension Mother    Cancer Father        lung   Colon cancer Neg Hx     Past Surgical History:  Procedure Laterality Date   BIOPSY  10/18/2014   Procedure: BIOPSY (Gastric);  Surgeon: Danie Binder, MD;   Location: [facility];  Service: Endoscopy;;   CARPAL TUNNEL RELEASE     R hand   COLONOSCOPY WITH PROPOFOL N/A 10/18/2014   RFF:[id number] size external hemorrhoids/left colon is redundant   ESOPHAGOGASTRODUODENOSCOPY (EGD) WITH PROPOFOL N/A 10/18/2014   SLF: epigastric pain due to moderate NSAID gastritis   FEMUR SURGERY     left   HERNIA REPAIR  04/07/12   umb hernia  repair   INSERTION OF MESH N/A 04/07/2012   Procedure: INSERTION OF MESH;  Surgeon: Madilyn Hook, DO;  Location: [facility];  Service: General;  Laterality: N/A;   KNEE ARTHROSCOPY     right   MINOR CARPAL TUNNEL Left 01/31/2016   Procedure: MINOR CARPAL TUNNEL RELEASE LEFT;  Surgeon: Charlotte Crumb, MD;  Location: [facility];  Service: Orthopedics;  Laterality: Left;  Local   TOE SURGERY     dislocated lt foot   UMBILICAL HERNIA REPAIR N/A 04/07/2012   Procedure: HERNIA REPAIR UMBILICAL ADULT;  Surgeon: Madilyn Hook, DO;  Location: [facility];  Service: General;  Laterality: N/A;  open umbilical hernia repair with mesh    ROS: Review of Systems  Constitutional:  Negative for appetite change.  HENT:  Negative for congestion and hearing loss.   Eyes:        Wears glasses for reading and distance.  Had an eye exam sometime last year.  Respiratory:  Negative for cough and chest tightness.     PHYSICAL EXAM: BP 131/80   Pulse 70   Resp 16   Wt 231 lb 6.4 oz (105 kg)   SpO2 95%   BMI 35.18 kg/m   Wt Readings from Last 3 Encounters:  12/19/20 231 lb 6.4 oz (105 kg)  08/11/20 225 lb 6.4 oz (102.2 kg)  04/10/20 244 lb 3.2 oz (110.8 kg)    Physical Exam   General appearance - alert, well appearing, middle-aged older African-American male and in no distress Mental status - normal mood, behavior, speech, dress, motor activity, and thought processes Eyes - pupils equal and reactive, extraocular eye movements intact Ears - bilateral TM's and external ear canals normal Nose -moderate enlargement of both nasal  turbinates.  Nasal mucosa dry. Mouth -he has partials above and below.  Throat is clear without erythema or exudates. Neck - supple, no significant adenopathy Lymphatics - no palpable lymphadenopathy, no hepatosplenomegaly Chest - clear to auscultation, no wheezes, rales or rhonchi, symmetric air entry Heart - normal rate, regular rhythm, normal S1, S2, no murmurs, rubs, clicks or gallops Breasts: He has mild to moderate gynecomastia. Abdomen - soft, nontender, nondistended, no masses or organomegaly Rectal -Samuel Irwin is present: He appears to have noninflamed hemorrhoid at the 4 o'clock position of the 11 o'clock position.  They do not appear inflamed. Extremities - peripheral pulses normal, no pedal edema, no clubbing or cyanosis Skin -no abnormal skin growth seen on the anterior posterior thorax.  CMP Latest Ref Rng & Units 08/11/2020 09/20/2019 04/26/2018  Glucose 65 - 99 mg/dL 102(H) 110(H) 105(H)  BUN 6 - 24 mg/dL 10 18 <5(L)  Creatinine 0.76 - 1.27 mg/dL 0.95 1.08 1.03  Sodium 134 - 144 mmol/L 140 140 139  Potassium 3.5 - 5.2 mmol/L 3.8 3.6 3.7  Chloride 96 - 106 mmol/L 100 101 106  CO2 20 - 29 mmol/L 24 25 25   Calcium 8.7 - 10.2 mg/dL 10.0 9.8 9.5  Total Protein 6.0 - 8.5 g/dL 7.4 7.3 7.3  Total Bilirubin 0.0 - 1.2 mg/dL 0.8 0.4 0.5  Alkaline Phos 44 - 121 IU/L 71 76 63  AST 0 - 40 IU/L 16 13 20   ALT 0 - 44 IU/L 21 18 21    Lipid Panel     Component Value Date/Time   CHOL 134 12/07/2019 1115   TRIG 77 12/07/2019 1115   HDL 48 12/07/2019 1115   CHOLHDL 2.8 12/07/2019 1115   CHOLHDL 4.2 03/09/2014 1241  VLDL 36 03/09/2014 1241   LDLCALC 71 12/07/2019 1115    CBC    Component Value Date/Time   WBC 5.0 08/11/2020 1416   WBC 5.7 12/27/2016 1424   RBC 5.09 08/11/2020 1416   RBC 4.92 12/27/2016 1424   HGB 15.5 08/11/2020 1416   HCT 44.7 08/11/2020 1416   PLT 294 08/11/2020 1416   MCV 88 08/11/2020 1416   MCH 30.5 08/11/2020 1416   MCH 30.5 12/27/2016 1424   MCHC  34.7 08/11/2020 1416   MCHC 33.6 12/27/2016 1424   RDW 13.6 08/11/2020 1416   LYMPHSABS 1.9 04/30/2014 1246   MONOABS 1.9 (H) 04/30/2014 1246   EOSABS 0.0 04/30/2014 1246   BASOSABS 0.0 04/30/2014 1246    ASSESSMENT AND PLAN: 1. Annual physical exam   2. Essential hypertension Close to goal.  Continue HCTZ.  3. Obesity (BMI 30.0-34.9) Discussed and encouraged regular exercise.  Patient plans to get back to the gym to swim 3-4 times a week.  Discussed and encourage healthy eating habits.  Encouraged him to cut back on white carbohydrates, eliminate sugary drinks from the diet, incorporate fresh fruits and vegetables into the diet daily and to eat more lean white meat instead of red meat.  4. Mixed hyperlipidemia Continue atorvastatin - Lipid panel  5. Rectal pain Discussed the importance of keeping bowel movements soft and regular to prevent flareup of hemorrhoids.  Recommend increase fiber in the diet.  I have given some Anusol cream to use as needed.  Patient feels that his pain is coming from the inside of the rectum and would like to see the gastroenterologist to evaluate further. - Ambulatory referral to Gastroenterology - hydrocortisone (ANUSOL-HC) 2.5 % rectal cream; Place 1 application rectally 2 (two) times daily.  Dispense: 30 g; Refill: 0  6. Light smoker Strongly encouraged him to quit.  He is aware of health risks associated with continued smoking.  7. Need for vaccination against Streptococcus pneumoniae - Pneumococcal conjugate vaccine 20-valent  8. Prostate cancer screening Patient agreeable to prostate cancer screening. - PSA    Patient was given the opportunity to ask questions.  Patient verbalized understanding of the plan and was able to repeat key elements of the plan.   Orders Placed This Encounter  Procedures   Pneumococcal conjugate vaccine 20-valent   PSA   Lipid panel   Ambulatory referral to Gastroenterology      Requested Prescriptions    Signed Prescriptions Disp Refills   hydrocortisone (ANUSOL-HC) 2.5 % rectal cream 30 g 0    Sig: Place 1 application rectally 2 (two) times daily.    Return in about 4 months (around 04/21/2021).  Karle Plumber, MD, FACP

## 2020-12-19 NOTE — Patient Instructions (Addendum)
Healthy Eating Following a healthy eating pattern may help you to achieve and maintain a healthy body weight, reduce the risk of chronic disease, and live a long and productive life. It is important to follow a healthy eating pattern at an appropriate calorie level for your body. Your nutritional needs should be met primarily through food by choosing a variety of nutrient-rich foods. What are tips for following this plan? Reading food labels Read labels and choose the following: Reduced or low sodium. Juices with 100% fruit juice. Foods with low saturated fats and high polyunsaturated and monounsaturated fats. Foods with whole grains, such as whole wheat, cracked wheat, brown rice, and wild rice. Whole grains that are fortified with folic acid. This is recommended for women who are pregnant or who want to become pregnant. Read labels and avoid the following: Foods with a lot of added sugars. These include foods that contain brown sugar, corn sweetener, corn syrup, dextrose, fructose, glucose, high-fructose corn syrup, honey, invert sugar, lactose, malt syrup, maltose, molasses, raw sugar, sucrose, trehalose, or turbinado sugar. Do not eat more than the following amounts of added sugar per day: 6 teaspoons (25 g) for women. 9 teaspoons (38 g) for men. Foods that contain processed or refined starches and grains. Refined grain products, such as white flour, degermed cornmeal, white bread, and white rice. Shopping Choose nutrient-rich snacks, such as vegetables, whole fruits, and nuts. Avoid high-calorie and high-sugar snacks, such as potato chips, fruit snacks, and candy. Use oil-based dressings and spreads on foods instead of solid fats such as butter, stick margarine, or cream cheese. Limit pre-made sauces, mixes, and "instant" products such as flavored rice, instant noodles, and ready-made pasta. Try more plant-protein sources, such as tofu, tempeh, black beans, edamame, lentils, nuts, and  seeds. Explore eating plans such as the Mediterranean diet or vegetarian diet. Cooking Use oil to saut or stir-fry foods instead of solid fats such as butter, stick margarine, or lard. Try baking, boiling, grilling, or broiling instead of frying. Remove the fatty part of meats before cooking. Steam vegetables in water or broth. Meal planning  At meals, imagine dividing your plate into fourths: One-half of your plate is fruits and vegetables. One-fourth of your plate is whole grains. One-fourth of your plate is protein, especially lean meats, poultry, eggs, tofu, beans, or nuts. Include low-fat dairy as part of your daily diet. Lifestyle Choose healthy options in all settings, including home, work, school, restaurants, or stores. Prepare your food safely: Wash your hands after handling raw meats. Keep food preparation surfaces clean by regularly washing with hot, soapy water. Keep raw meats separate from ready-to-eat foods, such as fruits and vegetables. Cook seafood, meat, poultry, and eggs to the recommended internal temperature. Store foods at safe temperatures. In general: Keep cold foods at 40F (4.4C) or below. Keep hot foods at 140F (60C) or above. Keep your freezer at 0F (-17.8C) or below. Foods are no longer safe to eat when they have been between the temperatures of 40-140F (4.4-60C) for more than 2 hours. What foods should I eat? Fruits Aim to eat 2 cup-equivalents of fresh, canned (in natural juice), or frozen fruits each day. Examples of 1 cup-equivalent of fruit include 1 small apple, 8 large strawberries, 1 cup canned fruit,  cup dried fruit, or 1 cup 100% juice. Vegetables Aim to eat 2-3 cup-equivalents of fresh and frozen vegetables each day, including different varieties and colors. Examples of 1 cup-equivalent of vegetables include 2 medium carrots, 2 cups raw,   leafy greens, 1 cup chopped vegetable (raw or cooked), or 1 medium baked potato. Grains Aim to  eat 6 ounce-equivalents of whole grains each day. Examples of 1 ounce-equivalent of grains include 1 slice of bread, 1 cup ready-to-eat cereal, 3 cups popcorn, or  cup cooked rice, pasta, or cereal. Meats and other proteins Aim to eat 5-6 ounce-equivalents of protein each day. Examples of 1 ounce-equivalent of protein include 1 egg, 1/2 cup nuts or seeds, or 1 tablespoon (16 g) peanut butter. A cut of meat or fish that is the size of a deck of cards is about 3-4 ounce-equivalents. Of the protein you eat each week, try to have at least 8 ounces come from seafood. This includes salmon, trout, herring, and anchovies. Dairy Aim to eat 3 cup-equivalents of fat-free or low-fat dairy each day. Examples of 1 cup-equivalent of dairy include 1 cup (240 mL) milk, 8 ounces (250 g) yogurt, 1 ounces (44 g) natural cheese, or 1 cup (240 mL) fortified soy milk. Fats and oils Aim for about 5 teaspoons (21 g) per day. Choose monounsaturated fats, such as canola and olive oils, avocados, peanut butter, and most nuts, or polyunsaturated fats, such as sunflower, corn, and soybean oils, walnuts, pine nuts, sesame seeds, sunflower seeds, and flaxseed. Beverages Aim for six 8-oz glasses of water per day. Limit coffee to three to five 8-oz cups per day. Limit caffeinated beverages that have added calories, such as soda and energy drinks. Limit alcohol intake to no more than 1 drink a day for nonpregnant women and 2 drinks a day for men. One drink equals 12 oz of beer (355 mL), 5 oz of wine (148 mL), or 1 oz of hard liquor (44 mL). Seasoning and other foods Avoid adding excess amounts of salt to your foods. Try flavoring foods with herbs and spices instead of salt. Avoid adding sugar to foods. Try using oil-based dressings, sauces, and spreads instead of solid fats. This information is based on general [place] nutrition guidelines. For more information, visit [employer].es. Exact amounts may vary based on your nutrition  needs. Summary A healthy eating plan may help you to maintain a healthy weight, reduce the risk of chronic diseases, and stay active throughout your life. Plan your meals. Make sure you eat the right portions of a variety of nutrient-rich foods. Try baking, boiling, grilling, or broiling instead of frying. Choose healthy options in all settings, including home, work, school, restaurants, or stores. This information is not intended to replace advice given to you by your health care provider. Make sure you discuss any questions you have with your health care provider. Document Revised: 05/26/2017 Document Reviewed: 05/26/2017 Elsevier Patient Education  2022 [employer].  Hemorrhoids Hemorrhoids are swollen veins that may develop: In the butt (rectum). These are called internal hemorrhoids. Around the opening of the butt (anus). These are called external hemorrhoids. Hemorrhoids can cause pain, itching, or bleeding. Most of the time, they do not cause serious problems. They usually get better with diet changes, lifestyle changes, and other home treatments. What are the causes? This condition may be caused by: Having trouble pooping (constipation). Pushing hard (straining) to poop. Watery poop (diarrhea). Pregnancy. Being very overweight (obese). Sitting for long periods of time. Heavy lifting or other activity that causes you to strain. Anal sex. Riding a bike for a long period of time. What are the signs or symptoms? Symptoms of this condition include: Pain. Itching or soreness in the butt. Bleeding from the butt.  Leaking poop. Swelling in the area. One or more lumps around the opening of your butt. How is this diagnosed? A doctor can often diagnose this condition by looking at the affected area. The doctor may also: Do an exam that involves feeling the area with a gloved hand (digital rectal exam). Examine the area inside your butt using a small tube (anoscope). Order blood  tests. This may be done if you have lost a lot of blood. Have you get a test that involves looking inside the colon using a flexible tube with a camera on the end (sigmoidoscopy or colonoscopy). How is this treated? This condition can usually be treated at home. Your doctor may tell you to change what you eat, make lifestyle changes, or try home treatments. If these do not help, procedures can be done to remove the hemorrhoids or make them smaller. These may involve: Placing rubber bands at the base of the hemorrhoids to cut off their blood supply. Injecting medicine into the hemorrhoids to shrink them. Shining a type of light energy onto the hemorrhoids to cause them to fall off. Doing surgery to remove the hemorrhoids or cut off their blood supply. Follow these instructions at home: Eating and drinking  Eat foods that have a lot of fiber in them. These include whole grains, beans, nuts, fruits, and vegetables. Ask your doctor about taking products that have added fiber (fibersupplements). Reduce the amount of fat in your diet. You can do this by: Eating low-fat dairy products. Eating less red meat. Avoiding processed foods. Drink enough fluid to keep your pee (urine) pale yellow. Managing pain and swelling  Take a warm-water bath (sitz bath) for 20 minutes to ease pain. Do this 3-4 times a day. You may do this in a bathtub or using a portable sitz bath that fits over the toilet. If told, put ice on the painful area. It may be helpful to use ice between your warm baths. Put ice in a plastic bag. Place a towel between your skin and the bag. Leave the ice on for 20 minutes, 2-3 times a day. General instructions Take over-the-counter and prescription medicines only as told by your doctor. Medicated creams and medicines may be used as told. Exercise often. Ask your doctor how much and what kind of exercise is best for you. Go to the bathroom when you have the urge to poop. Do not  wait. Avoid pushing too hard when you poop. Keep your butt dry and clean. Use wet toilet paper or moist towelettes after pooping. Do not sit on the toilet for a long time. Keep all follow-up visits as told by your doctor. This is important. Contact a doctor if you: Have pain and swelling that do not get better with treatment or medicine. Have trouble pooping. Cannot poop. Have pain or swelling outside the area of the hemorrhoids. Get help right away if you have: Bleeding that will not stop. Summary Hemorrhoids are swollen veins in the butt or around the opening of the butt. They can cause pain, itching, or bleeding. Eat foods that have a lot of fiber in them. These include whole grains, beans, nuts, fruits, and vegetables. Take a warm-water bath (sitz bath) for 20 minutes to ease pain. Do this 3-4 times a day. This information is not intended to replace advice given to you by your health care provider. Make sure you discuss any questions you have with your health care provider. Document Revised: 02/19/2018 Document Reviewed: 07/03/2017 Elsevier Patient Education  [place].

## 2020-12-20 ENCOUNTER — Other Ambulatory Visit: Payer: Self-pay

## 2020-12-20 LAB — PSA: Prostate Specific Ag, Serum: 0.4 ng/mL (ref 0.0–4.0)

## 2020-12-20 LAB — LIPID PANEL
Chol/HDL Ratio: 2.6 ratio (ref 0.0–5.0)
Cholesterol, Total: 140 mg/dL (ref 100–199)
HDL: 53 mg/dL (ref >39)
LDL Chol Calc (NIH): 60 mg/dL (ref 0–99)
Triglycerides: 163 mg/dL — ABNORMAL HIGH (ref 0–149)
VLDL Cholesterol Cal: 27 mg/dL (ref 5–40)

## 2020-12-27 ENCOUNTER — Other Ambulatory Visit: Payer: Self-pay

## 2021-01-01 ENCOUNTER — Other Ambulatory Visit: Payer: Self-pay | Admitting: Internal Medicine

## 2021-01-01 ENCOUNTER — Other Ambulatory Visit: Payer: Self-pay

## 2021-01-01 DIAGNOSIS — N400 Enlarged prostate without lower urinary tract symptoms: Secondary | ICD-10-CM

## 2021-01-01 MED ORDER — TAMSULOSIN HCL 0.4 MG PO CAPS
0.4000 mg | ORAL_CAPSULE | Freq: Every day | ORAL | 0 refills | Status: DC
  Filled 2021-01-01: qty 30, 30d supply, fill #0

## 2021-01-01 MED FILL — Atorvastatin Calcium Tab 20 MG (Base Equivalent): ORAL | 30 days supply | Qty: 30 | Fill #7 | Status: AC

## 2021-01-01 NOTE — Telephone Encounter (Signed)
Requested Prescriptions  Pending Prescriptions Disp Refills  . tamsulosin (FLOMAX) 0.4 MG CAPS capsule 30 capsule 0    Sig: Take 1 capsule (0.4 mg total) by mouth daily.     Urology: Alpha-Adrenergic Blocker Passed - 01/01/2021 11:51 AM      Passed - Last BP in normal range    BP Readings from Last 1 Encounters:  12/19/20 131/80         Passed - Valid encounter within last 12 months    Recent Outpatient Visits          1 week ago Annual physical exam   [place] Karle Plumber B, MD   3 months ago Right lower quadrant abdominal pain   [place] Fenton Foy, NP   4 months ago Essential hypertension   [place], MD   8 months ago Essential hypertension   [place], MD   10 months ago Need for zoster vaccination   [place], RPH-CPP      Future Appointments            In 2 months Marybelle Killings, MD [facility]   In 3 months Ladell Pier, MD [place]

## 2021-01-24 ENCOUNTER — Other Ambulatory Visit (HOSPITAL_COMMUNITY): Payer: Self-pay

## 2021-01-29 ENCOUNTER — Other Ambulatory Visit: Payer: Self-pay

## 2021-01-29 ENCOUNTER — Other Ambulatory Visit: Payer: Self-pay | Admitting: Internal Medicine

## 2021-01-29 ENCOUNTER — Encounter: Payer: Self-pay | Admitting: Internal Medicine

## 2021-01-29 DIAGNOSIS — N400 Enlarged prostate without lower urinary tract symptoms: Secondary | ICD-10-CM

## 2021-01-29 MED ORDER — TAMSULOSIN HCL 0.4 MG PO CAPS
0.4000 mg | ORAL_CAPSULE | Freq: Every day | ORAL | 6 refills | Status: DC
  Filled 2021-01-29: qty 30, 30d supply, fill #0
  Filled 2021-03-01: qty 30, 30d supply, fill #1
  Filled 2021-03-02 (×2): qty 30, 30d supply, fill #0
  Filled 2021-03-29: qty 30, 30d supply, fill #1
  Filled 2021-04-30: qty 30, 30d supply, fill #2
  Filled 2021-05-31: qty 30, 30d supply, fill #3
  Filled 2021-07-02: qty 30, 30d supply, fill #4
  Filled 2021-08-02: qty 30, 30d supply, fill #5

## 2021-01-29 MED ORDER — ACYCLOVIR 400 MG PO TABS
ORAL_TABLET | ORAL | 6 refills | Status: DC
  Filled 2021-01-29: qty 60, 30d supply, fill #0
  Filled 2021-03-01: qty 60, 30d supply, fill #1
  Filled 2021-03-02 (×2): qty 60, 30d supply, fill #0
  Filled 2021-03-29: qty 60, 30d supply, fill #1
  Filled 2021-04-30: qty 60, 30d supply, fill #2
  Filled 2021-05-31: qty 60, 30d supply, fill #3
  Filled 2021-07-02: qty 60, 30d supply, fill #4
  Filled 2021-08-02: qty 60, 30d supply, fill #5

## 2021-01-29 MED FILL — Atorvastatin Calcium Tab 20 MG (Base Equivalent): ORAL | 30 days supply | Qty: 30 | Fill #8 | Status: AC

## 2021-01-29 NOTE — Telephone Encounter (Signed)
Requested Prescriptions  Pending Prescriptions Disp Refills  . acyclovir (ZOVIRAX) 400 MG tablet 60 tablet 6    Sig: TAKE 1 TABLET (400 MG TOTAL) BY MOUTH 2 (TWO) TIMES DAILY.     Antimicrobials:  Antiviral Agents - Anti-Herpetic Passed - 01/29/2021  2:36 PM      Passed - Valid encounter within last 12 months    Recent Outpatient Visits          1 month ago Annual physical exam   [place] Ladell Pier, MD   4 months ago Right lower quadrant abdominal pain   [place] Fenton Foy, NP   5 months ago Essential hypertension   [place], MD   9 months ago Essential hypertension   [place], MD   11 months ago Need for zoster vaccination   [place], RPH-CPP      Future Appointments            In 1 month Lorin Mercy, Thana Farr, MD [facility]   In 2 months Ladell Pier, MD [place]           . tamsulosin (FLOMAX) 0.4 MG CAPS capsule 30 capsule 6    Sig: Take 1 capsule (0.4 mg total) by mouth daily.     Urology: Alpha-Adrenergic Blocker Passed - 01/29/2021  2:36 PM      Passed - Last BP in normal range    BP Readings from Last 1 Encounters:  12/19/20 131/80         Passed - Valid encounter within last 12 months    Recent Outpatient Visits          1 month ago Annual physical exam   [place] Karle Plumber B, MD   4 months ago Right lower quadrant abdominal pain   [place], NP   5 months ago Essential hypertension   [place], MD   9 months ago Essential hypertension   [place], MD   11 months ago Need for zoster  vaccination   [place], RPH-CPP      Future Appointments            In 1 month Lorin Mercy, Thana Farr, MD [facility]   In 2 months Ladell Pier, MD [place]

## 2021-01-30 ENCOUNTER — Other Ambulatory Visit: Payer: Self-pay

## 2021-03-01 MED FILL — Atorvastatin Calcium Tab 20 MG (Base Equivalent): ORAL | 30 days supply | Qty: 30 | Fill #9 | Status: CN

## 2021-03-02 ENCOUNTER — Other Ambulatory Visit (HOSPITAL_COMMUNITY): Payer: Self-pay

## 2021-03-02 ENCOUNTER — Other Ambulatory Visit: Payer: Self-pay

## 2021-03-02 MED FILL — Atorvastatin Calcium Tab 20 MG (Base Equivalent): ORAL | 30 days supply | Qty: 30 | Fill #0 | Status: AC

## 2021-03-02 MED FILL — Atorvastatin Calcium Tab 20 MG (Base Equivalent): ORAL | 30 days supply | Qty: 30 | Fill #0 | Status: CN

## 2021-03-13 ENCOUNTER — Ambulatory Visit: Payer: Managed Care, Other (non HMO) | Admitting: Orthopaedic Surgery

## 2021-03-29 ENCOUNTER — Other Ambulatory Visit: Payer: Self-pay

## 2021-03-29 MED FILL — Atorvastatin Calcium Tab 20 MG (Base Equivalent): ORAL | 30 days supply | Qty: 30 | Fill #1 | Status: AC

## 2021-03-30 ENCOUNTER — Other Ambulatory Visit: Payer: Self-pay

## 2021-04-03 ENCOUNTER — Ambulatory Visit: Payer: Managed Care, Other (non HMO) | Admitting: Orthopaedic Surgery

## 2021-04-06 ENCOUNTER — Encounter: Payer: Self-pay | Admitting: Internal Medicine

## 2021-04-23 ENCOUNTER — Ambulatory Visit: Payer: Managed Care, Other (non HMO) | Attending: Internal Medicine | Admitting: Internal Medicine

## 2021-04-23 VITALS — Wt 221.0 lb

## 2021-04-23 DIAGNOSIS — F17210 Nicotine dependence, cigarettes, uncomplicated: Secondary | ICD-10-CM | POA: Diagnosis not present

## 2021-04-23 DIAGNOSIS — G4733 Obstructive sleep apnea (adult) (pediatric): Secondary | ICD-10-CM

## 2021-04-23 DIAGNOSIS — J301 Allergic rhinitis due to pollen: Secondary | ICD-10-CM | POA: Diagnosis not present

## 2021-04-23 DIAGNOSIS — I10 Essential (primary) hypertension: Secondary | ICD-10-CM

## 2021-04-23 DIAGNOSIS — R253 Fasciculation: Secondary | ICD-10-CM

## 2021-04-23 DIAGNOSIS — E669 Obesity, unspecified: Secondary | ICD-10-CM

## 2021-04-23 DIAGNOSIS — Z9989 Dependence on other enabling machines and devices: Secondary | ICD-10-CM

## 2021-04-23 NOTE — Progress Notes (Signed)
Patient ID: Samuel Irwin, male   DOB: October 25, 1960, 61 y.o.   MRN: [id number] Virtual Visit via Telephone Note  I connected with [name] on 04/23/2021 at 3:14 p.m by telephone and verified that I am speaking with the correct person using two identifiers  Location: Patient: home Provider: home  Participants: Myself Patient   I discussed the limitations, risks, security and privacy concerns of performing an evaluation and management service by telephone and the availability of in person appointments. I also discussed with the patient that there may be a patient responsible charge related to this service. The patient expressed understanding and agreed to proceed.  History of Present Illness: [name], HL, obesity, GERD, ED, tob dep, BPH, OSA on Bipap, Vit D def, dep/anxiety, paranoia (see 08/2017 visit), OA knee, COVID 19 01/2020.  Last eval 11/2020.  Today's visit is for chronic disease management.   C/o twitching in RT eye x 2 mths -occurs daily and can last about 10 sec. Started taking some vitamins yesterday and found that it is not as bad today. Also he thinks his eyes are always red.  Goes swimming at [employer].  Started wearing goggles when he goes swimming.   Endorses itchy eyes and sneezing lately Has not refill Flonase at the pharmacy in a while due to cost  HTN: reports compliance with HCTZ.  He has not checked BP in a while but does have a device.  He limits salt in the foods.   Obesity:  eats fruits and salad every day.  Hardly eats fried foods any more. Going to gym 2-3x/wk to swim or walk.  However he prefers swimming. Last wgh 221 lbs last wk.  On last office visit he was 231 lbs.  He is working on getting to 185 lbs.    Tob dep: not smoking much any more. Smokes only on Friday when he has a drink. States he is still working on quitting.    OSA BiPAP: needing supplies.  Now has insurance.  Wants to know if he needs another sleep study with continued wgh  loss  Urine sometimes smells strong. No burning with urination Outpatient Encounter Medications as of 04/23/2021  Medication Sig   acyclovir (ZOVIRAX) 400 MG tablet TAKE 1 TABLET (400 MG TOTAL) BY MOUTH 2 (TWO) TIMES DAILY.   aspirin EC 81 MG tablet Take 81 mg by mouth daily.   atorvastatin (LIPITOR) 20 MG tablet TAKE 1 TABLET (20 MG TOTAL) BY MOUTH DAILY.   azelastine (ASTELIN) 0.1 % nasal spray PLACE 2 SPRAYS INTO BOTH NOSTRILS 2 (TWO) TIMES DAILY. USE IN EACH NOSTRIL AS DIRECTED   fluticasone (FLONASE) 50 MCG/ACT nasal spray PLACE 2 SPRAYS INTO BOTH NOSTRILS DAILY.   gabapentin (NEURONTIN) 300 MG capsule TAKE 1 CAPSULE (300 MG TOTAL) BY MOUTH AT BEDTIME.   glucosamine-chondroitin 500-400 MG tablet Take 1-2 tablets by mouth daily.   hydrochlorothiazide (HYDRODIURIL) 25 MG tablet TAKE 1 TABLET (25 MG TOTAL) BY MOUTH DAILY. MUST HAVE OFFICE VISIT FOR REFILLS   hydrocortisone (ANUSOL-HC) 2.5 % rectal cream Place 1 application rectally 2 (two) times daily.   ibuprofen (ADVIL) 800 MG tablet Take 800 mg by mouth as needed for moderate pain.   meclizine (ANTIVERT) 25 MG tablet Take 1 tablet (25 mg total) by mouth 3 (three) times daily as needed for dizziness.   nicotine polacrilex (NICORETTE MINI) 2 MG lozenge Use as needed to quit smoking   omeprazole (PRILOSEC) 20 MG capsule Take 1  capsule (20 mg total) by mouth daily.   predniSONE (DELTASONE) 20 MG tablet Take 2 tablets (40 mg total) by mouth daily.   tadalafil (CIALIS) 10 MG tablet Take 10 mg by mouth daily as needed for erectile dysfunction.   tamsulosin (FLOMAX) 0.4 MG CAPS capsule Take 1 capsule (0.4 mg total) by mouth daily.   No facility-administered encounter medications on file as of 04/23/2021.      Observations/Objective: Results for orders placed or performed in visit on 12/19/20  PSA  Result Value Ref Range   Prostate Specific Ag, Serum 0.4 0.0 - 4.0 ng/mL  Lipid panel  Result Value Ref Range   Cholesterol, Total 140 100 -  199 mg/dL   Triglycerides 163 (H) 0 - 149 mg/dL   HDL 53 >39 mg/dL   VLDL Cholesterol Cal 27 5 - 40 mg/dL   LDL Chol Calc (NIH) 60 0 - 99 mg/dL   Chol/HDL Ratio 2.6 0.0 - 5.0 ratio     Assessment and Plan: 1. Essential hypertension Continue hydrochlorothiazide.  Advised to check blood pressure at least once a week with goal being 130/80 or lower.  2. Light smoker Advised to quit completely.  Patient states he is working on it.  3. Seasonal allergic rhinitis due to pollen He has some symptoms of allergic rhinitis.  Advised that he can purchase Flonase over-the-counter.  Advised to also purchase Claritin or Zyrtec over-the-counter to use with the Flonase.  4. Obesity (BMI 30.0-34.9) Commended him on trying to exercise regularly and on 10 pound weight loss since last visit.  Continue regular exercise and trying to eat healthy.  5. OSA on CPAP Advised patient to call [facility] or [facility] to order his BiPAP supplies.  Advised that we can do repeat sleep study once his weight falls below 200.  6. Eye muscle twitches Let me know if this continues [place] we can refer to neurology  Advised that urine can smell strong if it is concentrated.  Encouraged him to drink several glasses of water daily.   Follow Up Instructions: 4 mths   I discussed the assessment and treatment plan with the patient. The patient was provided an opportunity to ask questions and all were answered. The patient agreed with the plan and demonstrated an understanding of the instructions.   The patient was advised to call back or seek an in-person evaluation if the symptoms worsen or if the condition fails to improve as anticipated.  I  Spent 23 minutes on this telephone encounter  This note has been created with Surveyor, quantity. Any transcriptional errors are unintentional.  Karle Plumber, MD

## 2021-04-30 ENCOUNTER — Other Ambulatory Visit: Payer: Self-pay | Admitting: Internal Medicine

## 2021-04-30 ENCOUNTER — Other Ambulatory Visit: Payer: Self-pay

## 2021-04-30 DIAGNOSIS — I10 Essential (primary) hypertension: Secondary | ICD-10-CM

## 2021-04-30 MED FILL — Atorvastatin Calcium Tab 20 MG (Base Equivalent): ORAL | 30 days supply | Qty: 30 | Fill #2 | Status: AC

## 2021-05-01 NOTE — Telephone Encounter (Signed)
Requested medication (s) are due for refill today: Yes ? ?Requested medication (s) are on the active medication list: Yes ? ?Last refill:  11/01/20 ? ?Future visit scheduled: Yes ? ?Notes to clinic:  Protocol indicates pt. Needs lab work. ? ? ? ?Requested Prescriptions  ?Pending Prescriptions Disp Refills  ? hydrochlorothiazide (HYDRODIURIL) 25 MG tablet 90 tablet 1  ?  Sig: TAKE 1 TABLET (25 MG TOTAL) BY MOUTH DAILY. MUST HAVE OFFICE VISIT FOR REFILLS  ?  ? Cardiovascular: Diuretics - Thiazide Failed - 04/30/2021  2:33 PM  ?  ?  Failed - Cr in normal range and within 180 days  ?  Creat  ?Date Value Ref Range Status  ?09/19/2015 1.09 0.70 - 1.33 mg/dL Final  ?  Comment:  ?    ?For patients > or = 59 years of age: The upper reference limit for ?Creatinine is approximately 13% higher for people identified as ?African-American. ?  ?  ? ?Creatinine, Ser  ?Date Value Ref Range Status  ?08/11/2020 0.95 0.76 - 1.27 mg/dL Final  ? ?Creatinine, Urine  ?Date Value Ref Range Status  ?04/30/2014 397.06 mg/dL Final  ?  ?  ?  ?  Failed - K in normal range and within 180 days  ?  Potassium  ?Date Value Ref Range Status  ?08/11/2020 3.8 3.5 - 5.2 mmol/L Final  ?  ?  ?  ?  Failed - Na in normal range and within 180 days  ?  Sodium  ?Date Value Ref Range Status  ?08/11/2020 140 134 - 144 mmol/L Final  ?  ?  ?  ?  Passed - Last BP in normal range  ?  BP Readings from Last 1 Encounters:  ?12/19/20 131/80  ?  ?  ?  ?  Passed - Valid encounter within last 6 months  ?  Recent Outpatient Visits   ? ?      ? 1 week ago Essential hypertension  ? [place] Ladell Pier, MD  ? 4 months ago Annual physical exam  ? [place] Ladell Pier, MD  ? 7 months ago Right lower quadrant abdominal pain  ? [place], Kriste Basque, NP  ? 8 months ago Essential hypertension  ? [place] Ladell Pier, MD  ? 1 year  ago Essential hypertension  ? [place] Ladell Pier, MD  ? ?  ?  ?Future Appointments   ? ?        ? In 4 months Ladell Pier, MD [place]  ? ?  ? ?  ?  ?  ? ?

## 2021-05-02 MED ORDER — HYDROCHLOROTHIAZIDE 25 MG PO TABS
ORAL_TABLET | ORAL | 0 refills | Status: DC
  Filled 2021-05-02: qty 30, 30d supply, fill #0

## 2021-05-03 ENCOUNTER — Other Ambulatory Visit: Payer: Self-pay

## 2021-05-04 ENCOUNTER — Other Ambulatory Visit: Payer: Self-pay

## 2021-05-24 ENCOUNTER — Ambulatory Visit (INDEPENDENT_AMBULATORY_CARE_PROVIDER_SITE_OTHER): Payer: Managed Care, Other (non HMO)

## 2021-05-24 ENCOUNTER — Encounter (HOSPITAL_COMMUNITY): Payer: Self-pay

## 2021-05-24 ENCOUNTER — Ambulatory Visit: Payer: Self-pay

## 2021-05-24 ENCOUNTER — Ambulatory Visit (HOSPITAL_COMMUNITY)
Admission: RE | Admit: 2021-05-24 | Discharge: 2021-05-24 | Disposition: A | Discharge status: Home / Self Care | Payer: Managed Care, Other (non HMO) | Source: Ambulatory Visit | Attending: Family Medicine | Admitting: Family Medicine

## 2021-05-24 VITALS — BP 135/87 | HR 73 | Temp 98.2°F | Resp 15

## 2021-05-24 DIAGNOSIS — R079 Chest pain, unspecified: Secondary | ICD-10-CM

## 2021-05-24 DIAGNOSIS — H5789 Other specified disorders of eye and adnexa: Secondary | ICD-10-CM

## 2021-05-24 MED ORDER — OLOPATADINE HCL 0.2 % OP SOLN
1.0000 [drp] | Freq: Every day | OPHTHALMIC | 0 refills | Status: DC
  Filled 2021-05-24: qty 2.5, 50d supply, fill #0

## 2021-05-24 MED ORDER — TIZANIDINE HCL 4 MG PO TABS
4.0000 mg | ORAL_TABLET | Freq: Three times a day (TID) | ORAL | 0 refills | Status: DC
  Filled 2021-05-24: qty 21, 7d supply, fill #0

## 2021-05-24 NOTE — Telephone Encounter (Signed)
Pt called back in, advised of message from [place] that pt needs to go to ED to be seen for chest pain. Pt had already made an appt for UC and states he will be going there soon. Advised him if he needed any f/up care to let us know.  ?

## 2021-05-24 NOTE — Telephone Encounter (Signed)
?  Chief Complaint: Chest pain that comes and goes with certain movements and picking objects up. Lasts a few seconds. No radiation of pain. ?Symptoms: Above ?Frequency: Started Tuesday ?Pertinent Negatives: Patient denies SOB,Nausea ?Disposition: '[]'$ ED /'[]'$ Urgent Care (no appt availability in office) / '[]'$ Appointment(In office/virtual)/ '[]'$  [facility]/ '[]'$ Home Care/ '[]'$ Refused Recommended Disposition /'[]'$ [place] [facility]/ '[x]'$  Follow-up with PCP ?Additional Notes: Instructed to go to ED for worsening of symptoms. Asking to be worked in Brewing technologist. Please advise pt.  ? ?Answer Assessment - Initial Assessment Questions ?1. LOCATION: "Where does it hurt?"   ?    Left side  ?2. RADIATION: "Does the pain go anywhere else?" (e.g., into neck, jaw, arms, back) ?    Hurts when moving arm ?3. ONSET: "When did the chest pain begin?" (Minutes, hours or days)  ?    Tuesday ?4. PATTERN "Does the pain come and go, or has it been constant since it started?"  "Does it get worse with exertion?"  ?    Comes and goes ?5. DURATION: "How long does it last" (e.g., seconds, minutes, hours) ?    Seconds ?6. SEVERITY: "How bad is the pain?"  (e.g., Scale 1-10; mild, moderate, or severe) ?   - MILD (1-3): doesn't interfere with normal activities  ?   - MODERATE (4-7): interferes with normal activities or awakens from sleep ?   - SEVERE (8-10): excruciating pain, unable to do any normal activities   ?    Hurts with movement or picking something up. 5-6 ?7. CARDIAC RISK FACTORS: "Do you have any history of heart problems or risk factors for heart disease?" (e.g., angina, prior heart attack; diabetes, high blood pressure, high cholesterol, smoker, or strong family history of heart disease) ?    HTN ?8. PULMONARY RISK FACTORS: "Do you have any history of lung disease?"  (e.g., blood clots in lung, asthma, emphysema, birth control pills) ?    No ?9. CAUSE: "What do you think is causing the chest pain?" ?    Unsure ?10. OTHER  SYMPTOMS: "Do you have any other symptoms?" (e.g., dizziness, nausea, vomiting, sweating, fever, difficulty breathing, cough) ?      No ?11. PREGNANCY: "Is there any chance you are pregnant?" "When was your last menstrual period?" ?      N/a ? ?Protocols used: Chest Pain-A-AH ? ?

## 2021-05-24 NOTE — ED Triage Notes (Signed)
Pt presents with chest pain. States head more pain yesterday and states he has trouble steering with his left hand. 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  ?

## 2021-05-24 NOTE — Discharge Instructions (Signed)
You have been seen at the [facility] today for chest pain. Your evaluation today was not suggestive of any emergent condition requiring medical intervention at this time. Your chest x-ray and ECG (heart tracing) did not show any worrisome changes. However, some medical problems make take more time to appear. Therefore, it's very important that you pay attention to any new symptoms or worsening of your current condition.  Please proceed directly to the Emergency Department immediately should you feel worse in any way or have any of the following symptoms: increasing or different chest pain, pain that spreads to your arm, neck, jaw, back or abdomen, shortness of breath, or nausea and vomiting.  

## 2021-05-24 NOTE — Telephone Encounter (Signed)
Pt returned call to [facility] in regards to a call back. Made Chaz aware that pt will need to go to the ED to be evaluated for his chest pain.  ?

## 2021-05-25 ENCOUNTER — Other Ambulatory Visit: Payer: Self-pay

## 2021-05-29 NOTE — ED Provider Notes (Signed)
?[place] ? ? ?[id number] ?05/24/21 Arrival Time: 2585 ? ?ASSESSMENT & PLAN: ? ?1. Chest pain, unspecified type   ?2. Irritation of both eyes   ? ? ?Patient history and exam consistent with non-cardiac cause of chest pain. History and PE suggests MSK etiology. ? ?ECG: ?Performed today and interpreted by me: normal EKG, normal sinus rhythm. No STEMI. ? ?I have personally viewed the imaging studies ordered this visit. ?No acute changes on CXR. No pneumothorax. ? ?With seasonal allergy flare. ? ?Begin: ?Meds ordered this encounter  ?Medications  ? tiZANidine (ZANAFLEX) 4 MG tablet  ?  Sig: Take 1 tablet (4 mg total) by mouth 3 (three) times daily.  ?  Dispense:  21 tablet  ?  Refill:  0  ? Olopatadine HCl 0.2 % SOLN  ?  Sig: Apply 1 drop to eye daily.  ?  Dispense:  2.5 mL  ?  Refill:  0  ? ? ?Chest pain precautions given. ? ? ? ?Discharge Instructions   ? ?  ?You have been seen at the [facility] today for chest pain. Your evaluation today was not suggestive of any emergent condition requiring medical intervention at this time. Your chest x-ray and ECG (heart tracing) did not show any worrisome changes. However, some medical problems make take more time to appear. Therefore, it's very important that you pay attention to any new symptoms or worsening of your current condition. ? ?Please proceed directly to the Emergency Department immediately should you feel worse in any way or have any of the following symptoms: increasing or different chest pain, pain that spreads to your arm, neck, jaw, back or abdomen, shortness of breath, or nausea and vomiting. ? ? ? ? ? ?Reviewed expectations re: course of current medical issues. Questions answered. ?Outlined signs and symptoms indicating need for more acute intervention. ?Patient verbalized understanding. ?After Visit Summary given. ? ? ?SUBJECTIVE: ? ?History from: patient. ?Samuel Irwin is a 61 y.o. male who presents with complaint of intermittent  left chest area discomfort described as dull that does not radiate. Onset gradual, first noted  sev days ago . Reports these symptoms are unchanged since beginning. ?Pain is worse with moving LUE. No assoc SOB/n/v/diaphoresis. ?Denies trauma. ?Typical duration of symptoms when present: few seconds. ?Denies: claudication, fatigue, lower extremity edema, orthopnea, palpitations, and paroxysmal nocturnal dyspnea. ? ?Recent illnesses: none. Fever: absent. ?Ambulatory without assistance. ?Self/OTC treatment: none. ?History of similar: no. ?Recreational drug use: denies. ? ?Social History  ? ?Tobacco Use  ?Smoking Status Some Days  ? Packs/day: 0.25  ? Years: 43.00  ? Pack years: 10.75  ? Types: Cigarettes  ?Smokeless Tobacco Never  ?Tobacco Comments  ? occas still smokes when he drinks  ? ?Social History  ? ?Substance and Sexual Activity  ?Alcohol Use Yes  ? Comment: once weekly  ? ? ? ?OBJECTIVE: ? ?Vitals:  ? 05/24/21 1741  ?BP: 135/87  ?Pulse: 73  ?Resp: 15  ?Temp: 98.2 ?F (36.8 ?C)  ?TempSrc: Oral  ?SpO2: 95%  ?  ?General appearance: alert, oriented, no acute distress ?Eyes: PERRLA; EOMI; conjunctivae normal ?HENT: normocephalic; atraumatic ?Neck: supple with FROM ?Lungs: without labored respirations; speaks full sentences without difficulty; CTAB ?Heart: regular rate and rhythm without murmer ?Chest Wall: with tenderness to palpation over L upper chest; reports this is the pain he feels with LUE movement at times ?Abdomen: soft, non-tender; no guarding or rebound tenderness ?Extremities: without edema; without calf swelling or tenderness; symmetrical without  gross deformities ?Skin: warm and dry; without rash or lesions ?Neuro: normal gait ?Psychological: alert and cooperative; normal mood and affect ? ? ?No Known Allergies ? ?Past Medical History:  ?Diagnosis Date  ? Constipation   ? Cough   ? Generalized headaches   ? GERD (gastroesophageal reflux disease)   ? H/O hiatal hernia   ? Heart murmur   ? Herpes   ?  Hypertension   ? Sleep apnea   ? uses BIPAP  ? Umbilical hernia   ? Weakness   ? Wheezing   ? ?Social History  ? ?Socioeconomic History  ? Marital status: Single  ?  Spouse name: Not on file  ? Number of children: Not on file  ? Years of education: Not on file  ? Highest education level: Not on file  ?Occupational History  ? Occupation: unemployed  ?Tobacco Use  ? Smoking status: Some Days  ?  Packs/day: 0.25  ?  Years: 43.00  ?  Pack years: 10.75  ?  Types: Cigarettes  ? Smokeless tobacco: Never  ? Tobacco comments:  ?  occas still smokes when he drinks  ?Vaping Use  ? Vaping Use: Never used  ?Substance and Sexual Activity  ? Alcohol use: Yes  ?  Comment: once weekly  ? Drug use: Not Currently  ?  Types: Cocaine, "Crack" cocaine  ?  Comment: smoked crack  ? Sexual activity: Not Currently  ?  Comment: "when I drink"  ?Other Topics Concern  ? Not on file  ?Social History Narrative  ? Not on file  ? ?Social Determinants of Health  ? ?Financial Resource Strain: Not on file  ?Food Insecurity: Not on file  ?Transportation Needs: Not on file  ?Physical Activity: Not on file  ?Stress: Not on file  ?Social Connections: Not on file  ?Intimate Partner Violence: Not on file  ? ?Family History  ?Problem Relation Age of Onset  ? Hypertension Mother   ? Cancer Father   ?     lung  ? Colon cancer Neg Hx   ? ?Past Surgical History:  ?Procedure Laterality Date  ? BIOPSY  10/18/2014  ? Procedure: BIOPSY (Gastric);  Surgeon: Danie Binder, MD;  Location: [facility];  Service: Endoscopy;;  ? CARPAL TUNNEL RELEASE    ? R hand  ? COLONOSCOPY WITH PROPOFOL N/A 10/18/2014  ? QAS:TMHDQQIW size external hemorrhoids/left colon is redundant  ? ESOPHAGOGASTRODUODENOSCOPY (EGD) WITH PROPOFOL N/A 10/18/2014  ? SLF: epigastric pain due to moderate NSAID gastritis  ? FEMUR SURGERY    ? left  ? HERNIA REPAIR  04/07/12  ? umb hernia repair  ? INSERTION OF MESH N/A 04/07/2012  ? Procedure: INSERTION OF MESH;  Surgeon: Madilyn Hook, DO;  Location: [place];   Service: General;  Laterality: N/A;  ? KNEE ARTHROSCOPY    ? right  ? MINOR CARPAL TUNNEL Left 01/31/2016  ? Procedure: MINOR CARPAL TUNNEL RELEASE LEFT;  Surgeon: Charlotte Crumb, MD;  Location: [place];  Service: Orthopedics;  Laterality: Left;  Local  ? TOE SURGERY    ? dislocated lt foot  ? UMBILICAL HERNIA REPAIR N/A 04/07/2012  ? Procedure: HERNIA REPAIR UMBILICAL ADULT;  Surgeon: Madilyn Hook, DO;  Location: [facility];  Service: General;  Laterality: N/A;  open umbilical hernia repair with mesh  ? ? ?  ?Vanessa Kick, MD ?05/29/21 [phone] ? ?

## 2021-05-31 ENCOUNTER — Other Ambulatory Visit: Payer: Self-pay | Admitting: Internal Medicine

## 2021-05-31 ENCOUNTER — Other Ambulatory Visit: Payer: Self-pay

## 2021-05-31 DIAGNOSIS — I10 Essential (primary) hypertension: Secondary | ICD-10-CM

## 2021-05-31 MED ORDER — ATORVASTATIN CALCIUM 20 MG PO TABS
ORAL_TABLET | Freq: Every day | ORAL | 0 refills | Status: DC
  Filled 2021-05-31: qty 30, 30d supply, fill #0
  Filled 2021-07-02: qty 30, 30d supply, fill #1
  Filled 2021-08-02: qty 30, 30d supply, fill #2

## 2021-05-31 MED ORDER — HYDROCHLOROTHIAZIDE 25 MG PO TABS
25.0000 mg | ORAL_TABLET | Freq: Every day | ORAL | 0 refills | Status: DC
  Filled 2021-05-31: qty 30, 30d supply, fill #0
  Filled 2021-07-02: qty 30, 30d supply, fill #1
  Filled 2021-08-02: qty 30, 30d supply, fill #2

## 2021-06-01 ENCOUNTER — Other Ambulatory Visit: Payer: Self-pay

## 2021-06-04 ENCOUNTER — Other Ambulatory Visit: Payer: Self-pay

## 2021-07-02 ENCOUNTER — Other Ambulatory Visit: Payer: Self-pay

## 2021-07-10 ENCOUNTER — Other Ambulatory Visit: Payer: Self-pay

## 2021-07-10 ENCOUNTER — Ambulatory Visit: Payer: Managed Care, Other (non HMO) | Attending: Internal Medicine | Admitting: Internal Medicine

## 2021-07-10 ENCOUNTER — Encounter: Payer: Self-pay | Admitting: Internal Medicine

## 2021-07-10 VITALS — BP 120/81 | HR 84 | Temp 98.1°F | Resp 16 | Wt 235.0 lb

## 2021-07-10 DIAGNOSIS — M542 Cervicalgia: Secondary | ICD-10-CM

## 2021-07-10 MED ORDER — DICLOFENAC SODIUM 1 % EX GEL
2.0000 g | Freq: Three times a day (TID) | CUTANEOUS | 0 refills | Status: DC
  Filled 2021-07-10: qty 100, 17d supply, fill #0

## 2021-07-10 MED ORDER — CYCLOBENZAPRINE HCL 5 MG PO TABS
ORAL_TABLET | ORAL | 0 refills | Status: DC
  Filled 2021-07-10: qty 30, 30d supply, fill #0

## 2021-07-10 NOTE — Patient Instructions (Signed)
Use a heating pad to the neck twice a day for about 15 minutes for the next 1 week. ?I have sent a prescription to our pharmacy for muscle relaxant called cyclobenzaprine for you to use daily for the next week.  After that you can use it as needed.  It does cause some drowsiness. ? ?I have sent a prescription to our pharmacy for diclofenac anti-inflammatory gel for you to apply to the neck 3 times daily. ?

## 2021-07-10 NOTE — Progress Notes (Signed)
Neck pain with certain movement x 2 mos  ?Pain and muscle relaxer PRN  ?

## 2021-07-10 NOTE — Progress Notes (Signed)
? ? ?Patient ID: Samuel Irwin, male    DOB: Mar 14, 1960  MRN: [id number] ? ?CC: Neck Pain ? ? ?Subjective: ?Samuel Irwin is a 61 y.o. male who presents for UC visit ?His concerns today include:  ?[name], HL, obesity, GERD, ED, tob dep, BPH, OSA on Bipap, Vit D def, dep/anxiety, paranoia (see 08/2017 visit), OA knee, COVID 19 01/2020. ? ?Pt c/o neck pain x 2 mths. ?He stopped working out x 1 mth to see if that was causing it. He was going 2-3xwk - walk around the track, swim,  floor planks and using wgh machines ?Reports pain in the posterior neck and it moves from one side to the other, sometimes in midline. ?Lasts several seconds.  "I can't explain the pain, it's just there. Like a catch." ?Most noticeable with movement of the neck.  No crunching sounds with neck movement.  Does not interfere with sleep. No new numbness or weakness in arms ?Took Zanaflex and Advil but still gets the pain.  Was not taking every day.   ? ?Patient Active Problem List  ? Diagnosis Date Noted  ? Right lower quadrant abdominal pain 09/29/2020  ? Primary osteoarthritis, right shoulder 09/10/2020  ? Chronic pain of both shoulders 12/07/2019  ? Prediabetes 09/21/2019  ? Paranoid state ([place]) 09/20/2019  ? Tobacco use 02/10/2019  ? Chronic cough 12/29/2018  ? Other headache syndrome 02/12/2018  ? Arthritis pain of hand 02/12/2018  ? Scrotal mass 08/04/2017  ? Obesity (BMI 35.0-39.9 without comorbidity) 03/10/2017  ? Burning sensation of feet 09/23/2016  ? Floaters in visual field, right 09/23/2016  ? Osteoarthritis of right knee 02/28/2016  ? Chronic bilateral low back pain 02/28/2016  ? Poor dentition 01/29/2016  ? Essential hypertension 06/06/2015  ? BPH (benign prostatic hyperplasia) 05/04/2015  ? Other seasonal allergic rhinitis 05/04/2015  ? Target of perceived adverse discrimination or persecution 04/05/2015  ? Hemorrhoids 02/23/2015  ? Constipation 02/23/2015  ? GERD (gastroesophageal reflux disease) 09/21/2014  ? Carpal  tunnel syndrome 08/12/2014  ? Erectile dysfunction 08/20/2013  ? Hyperlipidemia 08/20/2013  ? OSA (obstructive sleep apnea) 08/24/2012  ?  ? ?Current Outpatient Medications on File Prior to Visit  ?Medication Sig Dispense Refill  ? acyclovir (ZOVIRAX) 400 MG tablet TAKE 1 TABLET (400 MG TOTAL) BY MOUTH 2 (TWO) TIMES DAILY. 60 tablet 6  ? aspirin EC 81 MG tablet Take 81 mg by mouth daily.    ? atorvastatin (LIPITOR) 20 MG tablet TAKE 1 TABLET (20 MG TOTAL) BY MOUTH DAILY. 90 tablet 0  ? fluticasone (FLONASE) 50 MCG/ACT nasal spray PLACE 2 SPRAYS INTO BOTH NOSTRILS DAILY. 16 g 2  ? glucosamine-chondroitin 500-400 MG tablet Take 1-2 tablets by mouth daily.    ? hydrochlorothiazide (HYDRODIURIL) 25 MG tablet Take 1 tablet (25 mg total) by mouth daily. 90 tablet 0  ? hydrocortisone (ANUSOL-HC) 2.5 % rectal cream Place 1 application rectally 2 (two) times daily. 30 g 0  ? ibuprofen (ADVIL) 800 MG tablet Take 800 mg by mouth as needed for moderate pain.    ? meclizine (ANTIVERT) 25 MG tablet Take 1 tablet (25 mg total) by mouth 3 (three) times daily as needed for dizziness. 30 tablet 0  ? nicotine polacrilex (NICORETTE MINI) 2 MG lozenge Use as needed to quit smoking 40 lozenge 2  ? Olopatadine HCl 0.2 % SOLN Apply 1 drop to eye daily. 2.5 mL 0  ? omeprazole (PRILOSEC) 20 MG capsule Take 1 capsule (20 mg total)  by mouth daily. 30 capsule 3  ? predniSONE (DELTASONE) 20 MG tablet Take 2 tablets (40 mg total) by mouth daily. 10 tablet 0  ? tadalafil (CIALIS) 10 MG tablet Take 10 mg by mouth daily as needed for erectile dysfunction.    ? tamsulosin (FLOMAX) 0.4 MG CAPS capsule Take 1 capsule (0.4 mg total) by mouth daily. 30 capsule 6  ? azelastine (ASTELIN) 0.1 % nasal spray PLACE 2 SPRAYS INTO BOTH NOSTRILS 2 (TWO) TIMES DAILY. USE IN EACH NOSTRIL AS DIRECTED 30 mL 12  ? gabapentin (NEURONTIN) 300 MG capsule TAKE 1 CAPSULE (300 MG TOTAL) BY MOUTH AT BEDTIME. 30 capsule 3  ? ?No current facility-administered medications on  file prior to visit.  ? ? ?No Known Allergies ? ?Social History  ? ?Socioeconomic History  ? Marital status: Single  ?  Spouse name: Not on file  ? Number of children: Not on file  ? Years of education: Not on file  ? Highest education level: Not on file  ?Occupational History  ? Occupation: unemployed  ?Tobacco Use  ? Smoking status: Some Days  ?  Packs/day: 0.25  ?  Years: 43.00  ?  Pack years: 10.75  ?  Types: Cigarettes  ? Smokeless tobacco: Never  ? Tobacco comments:  ?  occas still smokes when he drinks  ?Vaping Use  ? Vaping Use: Never used  ?Substance and Sexual Activity  ? Alcohol use: Yes  ?  Comment: once weekly  ? Drug use: Not Currently  ?  Types: Cocaine, "Crack" cocaine  ?  Comment: smoked crack  ? Sexual activity: Not Currently  ?  Comment: "when I drink"  ?Other Topics Concern  ? Not on file  ?Social History Narrative  ? Not on file  ? ?Social Determinants of Health  ? ?Financial Resource Strain: Not on file  ?Food Insecurity: Not on file  ?Transportation Needs: Not on file  ?Physical Activity: Not on file  ?Stress: Not on file  ?Social Connections: Not on file  ?Intimate Partner Violence: Not on file  ? ? ?Family History  ?Problem Relation Age of Onset  ? Hypertension Mother   ? Cancer Father   ?     lung  ? Colon cancer Neg Hx   ? ? ?Past Surgical History:  ?Procedure Laterality Date  ? BIOPSY  10/18/2014  ? Procedure: BIOPSY (Gastric);  Surgeon: Danie Binder, MD;  Location: [facility];  Service: Endoscopy;;  ? CARPAL TUNNEL RELEASE    ? R hand  ? COLONOSCOPY WITH PROPOFOL N/A 10/18/2014  ? PZW:CHENIDPO size external hemorrhoids/left colon is redundant  ? ESOPHAGOGASTRODUODENOSCOPY (EGD) WITH PROPOFOL N/A 10/18/2014  ? SLF: epigastric pain due to moderate NSAID gastritis  ? FEMUR SURGERY    ? left  ? HERNIA REPAIR  04/07/12  ? umb hernia repair  ? INSERTION OF MESH N/A 04/07/2012  ? Procedure: INSERTION OF MESH;  Surgeon: Madilyn Hook, DO;  Location: [place];  Service: General;  Laterality: N/A;  ? KNEE  ARTHROSCOPY    ? right  ? MINOR CARPAL TUNNEL Left 01/31/2016  ? Procedure: MINOR CARPAL TUNNEL RELEASE LEFT;  Surgeon: Charlotte Crumb, MD;  Location: [place];  Service: Orthopedics;  Laterality: Left;  Local  ? TOE SURGERY    ? dislocated lt foot  ? UMBILICAL HERNIA REPAIR N/A 04/07/2012  ? Procedure: HERNIA REPAIR UMBILICAL ADULT;  Surgeon: Madilyn Hook, DO;  Location: [place];  Service: General;  Laterality: N/A;  open umbilical hernia repair with mesh  ? ? ?ROS: ?Review of Systems ?Negative except as stated above ? ?PHYSICAL EXAM: ?BP 120/81 (BP Location: Left Arm, Patient Position: Sitting, Cuff Size: Large)   Pulse 84   Temp 98.1 ?F (36.7 ?C) (Oral)   Resp 16   Wt 235 lb (106.6 kg)   SpO2 96%   BMI 35.73 kg/m?   ?Physical Exam ? ? ?General appearance - alert, well appearing, older African-American male and in no distress ?Mental status - normal mood, behavior, speech, dress, motor activity, and thought processes ?Musculoskeletal -patient has good range of motion of the neck.  Mild discomfort with posterior flexion.  No point tenderness of the cervical spine or surrounding paraspinal muscles. ?Power in both upper extremities 5/5 proximally and distally.  Grip 5/5 bilaterally. ? ?  Latest Ref Rng & Units 08/11/2020  ?  2:16 PM 09/20/2019  ?  4:47 PM 04/26/2018  ?  3:16 PM  ?CMP  ?Glucose 65 - 99 mg/dL 102   110   105    ?BUN 6 - 24 mg/dL 10   18   <5    ?Creatinine 0.76 - 1.27 mg/dL 0.95   1.08   1.03    ?Sodium 134 - 144 mmol/L 140   140   139    ?Potassium 3.5 - 5.2 mmol/L 3.8   3.6   3.7    ?Chloride 96 - 106 mmol/L 100   101   106    ?CO2 20 - 29 mmol/L '24   25   25    '$ ?Calcium 8.7 - 10.2 mg/dL 10.0   9.8   9.5    ?Total Protein 6.0 - 8.5 g/dL 7.4   7.3   7.3    ?Total Bilirubin 0.0 - 1.2 mg/dL 0.8   0.4   0.5    ?Alkaline Phos 44 - 121 IU/L 71   76   63    ?AST 0 - 40 IU/L '16   13   20    '$ ?ALT 0 - 44 IU/L '21   18   21    '$ ? ?Lipid Panel  ?   ?Component Value Date/Time  ? CHOL 140 12/19/2020  1523  ? TRIG 163 (H) 12/19/2020 1523  ? HDL 53 12/19/2020 1523  ? CHOLHDL 2.6 12/19/2020 1523  ? CHOLHDL 4.2 03/09/2014 1241  ? VLDL 36 03/09/2014 1241  ? LDLCALC 60 12/19/2020 1523  ? ? ?CBC ?   ?Component Va

## 2021-07-19 ENCOUNTER — Encounter: Payer: Self-pay | Admitting: Internal Medicine

## 2021-07-20 ENCOUNTER — Ambulatory Visit
Admission: RE | Admit: 2021-07-20 | Discharge: 2021-07-20 | Disposition: A | Discharge status: Home / Self Care | Payer: Managed Care, Other (non HMO) | Source: Ambulatory Visit | Attending: Internal Medicine | Admitting: Internal Medicine

## 2021-07-20 ENCOUNTER — Other Ambulatory Visit: Payer: Self-pay | Admitting: Internal Medicine

## 2021-07-20 ENCOUNTER — Encounter: Payer: Self-pay | Admitting: Internal Medicine

## 2021-07-20 DIAGNOSIS — M542 Cervicalgia: Secondary | ICD-10-CM

## 2021-08-02 ENCOUNTER — Other Ambulatory Visit: Payer: Self-pay

## 2021-08-03 ENCOUNTER — Other Ambulatory Visit: Payer: Self-pay

## 2021-08-30 ENCOUNTER — Other Ambulatory Visit: Payer: Self-pay | Admitting: Internal Medicine

## 2021-08-30 ENCOUNTER — Other Ambulatory Visit: Payer: Self-pay

## 2021-08-30 DIAGNOSIS — I10 Essential (primary) hypertension: Secondary | ICD-10-CM

## 2021-08-30 DIAGNOSIS — N400 Enlarged prostate without lower urinary tract symptoms: Secondary | ICD-10-CM

## 2021-08-31 ENCOUNTER — Other Ambulatory Visit: Payer: Self-pay

## 2021-08-31 ENCOUNTER — Ambulatory Visit: Payer: Managed Care, Other (non HMO) | Attending: Internal Medicine | Admitting: Internal Medicine

## 2021-08-31 ENCOUNTER — Encounter: Payer: Self-pay | Admitting: Internal Medicine

## 2021-08-31 VITALS — BP 125/81 | HR 79 | Ht 68.0 in | Wt 236.6 lb

## 2021-08-31 DIAGNOSIS — Z6835 Body mass index (BMI) 35.0-35.9, adult: Secondary | ICD-10-CM

## 2021-08-31 DIAGNOSIS — I10 Essential (primary) hypertension: Secondary | ICD-10-CM

## 2021-08-31 DIAGNOSIS — Z720 Tobacco use: Secondary | ICD-10-CM | POA: Diagnosis not present

## 2021-08-31 DIAGNOSIS — E782 Mixed hyperlipidemia: Secondary | ICD-10-CM

## 2021-08-31 MED ORDER — ATORVASTATIN CALCIUM 20 MG PO TABS
ORAL_TABLET | Freq: Every day | ORAL | 1 refills | Status: DC
  Filled 2021-08-31: qty 30, 30d supply, fill #0
  Filled 2021-09-27: qty 30, 30d supply, fill #1
  Filled 2021-10-10 – 2021-10-15 (×2): qty 30, 30d supply, fill #2
  Filled 2021-11-29: qty 30, 30d supply, fill #3
  Filled 2021-12-28: qty 30, 30d supply, fill #4
  Filled 2022-01-28: qty 30, 30d supply, fill #5

## 2021-08-31 MED ORDER — TAMSULOSIN HCL 0.4 MG PO CAPS
0.4000 mg | ORAL_CAPSULE | Freq: Every day | ORAL | 1 refills | Status: DC
  Filled 2021-08-31: qty 30, 30d supply, fill #0
  Filled 2021-09-27: qty 30, 30d supply, fill #1
  Filled 2021-10-31: qty 30, 30d supply, fill #2
  Filled 2021-11-29: qty 30, 30d supply, fill #3
  Filled 2021-12-28: qty 30, 30d supply, fill #4
  Filled 2022-01-28 (×2): qty 30, 30d supply, fill #5

## 2021-08-31 MED ORDER — ACYCLOVIR 400 MG PO TABS
ORAL_TABLET | ORAL | 1 refills | Status: DC
  Filled 2021-08-31: qty 60, 30d supply, fill #0
  Filled 2021-09-27: qty 60, 30d supply, fill #1
  Filled 2021-10-31: qty 60, 30d supply, fill #2
  Filled 2021-11-29: qty 60, 30d supply, fill #3
  Filled 2021-12-28: qty 60, 30d supply, fill #4
  Filled 2022-01-28: qty 60, 30d supply, fill #5

## 2021-08-31 NOTE — Progress Notes (Signed)
Patient ID: Samuel Irwin, male    DOB: 04-07-1960  MRN: [id number]  CC: Hypertension   Subjective: Samuel Irwin is a 61 y.o. male who presents for chronic ds management His concerns today include:  [name], HL, obesity, GERD, ED, tob dep, BPH, OSA on Bipap, Vit D def, dep/anxiety, paranoia (see 08/2017 visit), OA knee, COVID 19 01/2020.  HTN: Reports compliance with HCTZ.  Does not use much salt in his foods but tells me that he eats 2 small bags of potato chips a day.  He does not complain of any chest pains or shortness of breath.  HL: Reports compliance with atorvastatin.  Last LDL in October 2022 was 30 which is at goal.  Obesity: Weight is up 15 pounds since February of this year.  Patient had laid off going to the gym for a while when he was having neck pain.  Neck is much better.  He started going to the gym again last week but has not done anything this week stating "I am on vacation this week." He has been drinking punch this week.  Snacks on chips and fruits.  Tobacco dependence: Smokes about a half a pack of cigarettes on the weekends.  Patient Active Problem List   Diagnosis Date Noted   Right lower quadrant abdominal pain 09/29/2020   Primary osteoarthritis, right shoulder 09/10/2020   Chronic pain of both shoulders 12/07/2019   Prediabetes 09/21/2019   Paranoid state ([facility]) 09/20/2019   Tobacco use 02/10/2019   Chronic cough 12/29/2018   Other headache syndrome 02/12/2018   Arthritis pain of hand 02/12/2018   Scrotal mass 08/04/2017   Obesity (BMI 35.0-39.9 without comorbidity) 03/10/2017   Burning sensation of feet 09/23/2016   Floaters in visual field, right 09/23/2016   Osteoarthritis of right knee 02/28/2016   Chronic bilateral low back pain 02/28/2016   Poor dentition 01/29/2016   Essential hypertension 06/06/2015   BPH (benign prostatic hyperplasia) 05/04/2015   Other seasonal allergic rhinitis 05/04/2015   Target of perceived adverse  discrimination or persecution 04/05/2015   Hemorrhoids 02/23/2015   Constipation 02/23/2015   GERD (gastroesophageal reflux disease) 09/21/2014   Carpal tunnel syndrome 08/12/2014   Erectile dysfunction 08/20/2013   Hyperlipidemia 08/20/2013   OSA (obstructive sleep apnea) 08/24/2012     Current Outpatient Medications on File Prior to Visit  Medication Sig Dispense Refill   acyclovir (ZOVIRAX) 400 MG tablet TAKE 1 TABLET (400 MG TOTAL) BY MOUTH 2 (TWO) TIMES DAILY. 180 tablet 1   aspirin EC 81 MG tablet Take 81 mg by mouth daily.     atorvastatin (LIPITOR) 20 MG tablet TAKE 1 TABLET (20 MG TOTAL) BY MOUTH DAILY. 90 tablet 1   cyclobenzaprine (FLEXERIL) 5 MG tablet Take 1 tablet by mouth daily for 7 days then daily as needed. 30 tablet 0   diclofenac Sodium (VOLTAREN) 1 % GEL Apply 2 g topically 3 (three) times daily. 100 g 0   fluticasone (FLONASE) 50 MCG/ACT nasal spray PLACE 2 SPRAYS INTO BOTH NOSTRILS DAILY. 16 g 2   glucosamine-chondroitin 500-400 MG tablet Take 1-2 tablets by mouth daily.     hydrochlorothiazide (HYDRODIURIL) 25 MG tablet Take 1 tablet (25 mg total) by mouth daily. 90 tablet 0   hydrocortisone (ANUSOL-HC) 2.5 % rectal cream Place 1 application rectally 2 (two) times daily. 30 g 0   ibuprofen (ADVIL) 800 MG tablet Take 800 mg by mouth as needed for moderate pain.  meclizine (ANTIVERT) 25 MG tablet Take 1 tablet (25 mg total) by mouth 3 (three) times daily as needed for dizziness. 30 tablet 0   nicotine polacrilex (NICORETTE MINI) 2 MG lozenge Use as needed to quit smoking 40 lozenge 2   Olopatadine HCl 0.2 % SOLN Apply 1 drop to eye daily. 2.5 mL 0   omeprazole (PRILOSEC) 20 MG capsule Take 1 capsule (20 mg total) by mouth daily. 30 capsule 3   predniSONE (DELTASONE) 20 MG tablet Take 2 tablets (40 mg total) by mouth daily. 10 tablet 0   tadalafil (CIALIS) 10 MG tablet Take 10 mg by mouth daily as needed for erectile dysfunction.     tamsulosin (FLOMAX) 0.4 MG CAPS  capsule Take 1 capsule (0.4 mg total) by mouth daily. 90 capsule 1   azelastine (ASTELIN) 0.1 % nasal spray PLACE 2 SPRAYS INTO BOTH NOSTRILS 2 (TWO) TIMES DAILY. USE IN EACH NOSTRIL AS DIRECTED 30 mL 12   gabapentin (NEURONTIN) 300 MG capsule TAKE 1 CAPSULE (300 MG TOTAL) BY MOUTH AT BEDTIME. 30 capsule 3   No current facility-administered medications on file prior to visit.    No Known Allergies  Social History   Socioeconomic History   Marital status: Single    Spouse name: Not on file   Number of children: Not on file   Years of education: Not on file   Highest education level: Not on file  Occupational History   Occupation: unemployed  Tobacco Use   Smoking status: Some Days    Packs/day: 0.25    Years: 43.00    Total pack years: 10.75    Types: Cigarettes   Smokeless tobacco: Never   Tobacco comments:    occas still smokes when he drinks  Vaping Use   Vaping Use: Never used  Substance and Sexual Activity   Alcohol use: Yes    Comment: once weekly   Drug use: Not Currently    Types: Cocaine, "Crack" cocaine    Comment: smoked crack   Sexual activity: Not Currently    Comment: "when I drink"  Other Topics Concern   Not on file  Social History Narrative   Not on file   Social Determinants of Health   Financial Resource Strain: Not on file  Food Insecurity: Not on file  Transportation Needs: Not on file  Physical Activity: Not on file  Stress: Not on file  Social Connections: Not on file  Intimate Partner Violence: Not on file    Family History  Problem Relation Age of Onset   Hypertension Mother    Cancer Father        lung   Colon cancer Neg Hx     Past Surgical History:  Procedure Laterality Date   BIOPSY  10/18/2014   Procedure: BIOPSY (Gastric);  Surgeon: Danie Binder, MD;  Location: [facility];  Service: Endoscopy;;   CARPAL TUNNEL RELEASE     R hand   COLONOSCOPY WITH PROPOFOL N/A 10/18/2014   [id number] size external hemorrhoids/left colon  is redundant   ESOPHAGOGASTRODUODENOSCOPY (EGD) WITH PROPOFOL N/A 10/18/2014   SLF: epigastric pain due to moderate NSAID gastritis   FEMUR SURGERY     left   HERNIA REPAIR  04/07/12   umb hernia repair   INSERTION OF MESH N/A 04/07/2012   Procedure: INSERTION OF MESH;  Surgeon: Madilyn Hook, DO;  Location: [facility];  Service: General;  Laterality: N/A;   KNEE ARTHROSCOPY     right   MINOR  CARPAL TUNNEL Left 01/31/2016   Procedure: MINOR CARPAL TUNNEL RELEASE LEFT;  Surgeon: Charlotte Crumb, MD;  Location: [facility];  Service: Orthopedics;  Laterality: Left;  Local   TOE SURGERY     dislocated lt foot   UMBILICAL HERNIA REPAIR N/A 04/07/2012   Procedure: HERNIA REPAIR UMBILICAL ADULT;  Surgeon: Madilyn Hook, DO;  Location: [facility];  Service: General;  Laterality: N/A;  open umbilical hernia repair with mesh    ROS: Review of Systems Negative except as stated above  PHYSICAL EXAM: BP 125/81   Pulse 79   Ht '5\' 8"'$  (1.727 m)   Wt 236 lb 9.6 oz (107.3 kg)   SpO2 99%   BMI 35.97 kg/m   Wt Readings from Last 3 Encounters:  08/31/21 236 lb 9.6 oz (107.3 kg)  07/10/21 235 lb (106.6 kg)  04/23/21 221 lb (100.2 kg)    Physical Exam  General appearance - alert, well appearing, older African-American male and in no distress Mental status - normal mood, behavior, speech, dress, motor activity, and thought processes Neck - supple, no significant adenopathy Chest - clear to auscultation, no wheezes, rales or rhonchi, symmetric air entry Heart - normal rate, regular rhythm, normal S1, S2, no murmurs, rubs, clicks or gallops Extremities - peripheral pulses normal, no pedal edema, no clubbing or cyanosis      Latest Ref Rng & Units 08/11/2020    2:16 PM 09/20/2019    4:47 PM 04/26/2018    3:16 PM  CMP  Glucose 65 - 99 mg/dL 102  110  105   BUN 6 - 24 mg/dL 10  18  <5   Creatinine 0.76 - 1.27 mg/dL 0.95  1.08  1.03   Sodium 134 - 144 mmol/L 140  140  139   Potassium 3.5 - 5.2  mmol/L 3.8  3.6  3.7   Chloride 96 - 106 mmol/L 100  101  106   CO2 20 - 29 mmol/L '24  25  25   '$ Calcium 8.7 - 10.2 mg/dL 10.0  9.8  9.5   Total Protein 6.0 - 8.5 g/dL 7.4  7.3  7.3   Total Bilirubin 0.0 - 1.2 mg/dL 0.8  0.4  0.5   Alkaline Phos 44 - 121 IU/L 71  76  63   AST 0 - 40 IU/L '16  13  20   '$ ALT 0 - 44 IU/L '21  18  21    '$ Lipid Panel     Component Value Date/Time   CHOL 140 12/19/2020 1523   TRIG 163 (H) 12/19/2020 1523   HDL 53 12/19/2020 1523   CHOLHDL 2.6 12/19/2020 1523   CHOLHDL 4.2 03/09/2014 1241   VLDL 36 03/09/2014 1241   LDLCALC 60 12/19/2020 1523    CBC    Component Value Date/Time   WBC 5.0 08/11/2020 1416   WBC 5.7 12/27/2016 1424   RBC 5.09 08/11/2020 1416   RBC 4.92 12/27/2016 1424   HGB 15.5 08/11/2020 1416   HCT 44.7 08/11/2020 1416   PLT 294 08/11/2020 1416   MCV 88 08/11/2020 1416   MCH 30.5 08/11/2020 1416   MCH 30.5 12/27/2016 1424   MCHC 34.7 08/11/2020 1416   MCHC 33.6 12/27/2016 1424   RDW 13.6 08/11/2020 1416   LYMPHSABS 1.9 04/30/2014 1246   MONOABS 1.9 (H) 04/30/2014 1246   EOSABS 0.0 04/30/2014 1246   BASOSABS 0.0 04/30/2014 1246    ASSESSMENT AND PLAN: 1. Essential hypertension Close to goal.  Continue HCTZ.  Low-salt diet advised. - CBC; Future - Comprehensive metabolic panel; Future  2. Class 2 severe obesity due to excess calories with serious comorbidity and body mass index (BMI) of 35.0 to 35.9 in adult [facility]) Discussed and encourage healthy eating habits.  Advised to eliminate sugary drinks from his diet.  3. Tobacco use Strongly advised to quit.  He is aware of health risks associated with smoking.  4. Mixed hyperlipidemia Continue atorvastatin 20 mg daily. - Lipid panel; Future    Patient was given the opportunity to ask questions.  Patient verbalized understanding of the plan and was able to repeat key elements of the plan.   This documentation was completed using Radio producer.  Any  transcriptional errors are unintentional.  Orders Placed This Encounter  Procedures   CBC   Comprehensive metabolic panel   Lipid panel     Requested Prescriptions    No prescriptions requested or ordered in this encounter    Return in about 4 months (around 01/01/2022).  Karle Plumber, MD, FACP

## 2021-08-31 NOTE — Telephone Encounter (Signed)
Requested medication (s) are due for refill today: yes  Requested medication (s) are on the active medication list: yes  Last refill:  hctz 05/31/21 #90/0, flexeril 07/10/21 #30/0  Future visit scheduled: yes, today  Notes to clinic:  flexeril not delegated and hctz needs updated labs     Requested Prescriptions  Pending Prescriptions Disp Refills   hydrochlorothiazide (HYDRODIURIL) 25 MG tablet 90 tablet 0    Sig: Take 1 tablet (25 mg total) by mouth daily.     Cardiovascular: Diuretics - Thiazide Failed - 08/30/2021  3:56 PM      Failed - Cr in normal range and within 180 days    Creat  Date Value Ref Range Status  09/19/2015 1.09 0.70 - 1.33 mg/dL Final    Comment:      For patients > or = 59 years of age: The upper reference limit for Creatinine is approximately 13% higher for people identified as African-American.      Creatinine, Ser  Date Value Ref Range Status  08/11/2020 0.95 0.76 - 1.27 mg/dL Final   Creatinine, Urine  Date Value Ref Range Status  04/30/2014 397.06 mg/dL Final         Failed - K in normal range and within 180 days    Potassium  Date Value Ref Range Status  08/11/2020 3.8 3.5 - 5.2 mmol/L Final         Failed - Na in normal range and within 180 days    Sodium  Date Value Ref Range Status  08/11/2020 140 134 - 144 mmol/L Final         Passed - Last BP in normal range    BP Readings from Last 1 Encounters:  07/10/21 120/81         Passed - Valid encounter within last 6 months    Recent Outpatient Visits           1 month ago Bilateral neck pain   [place], Deborah B, MD   4 months ago Essential hypertension   [place], MD   8 months ago Annual physical exam   [place] Ladell Pier, MD   11 months ago Right lower quadrant abdominal pain   [place] Fenton Foy, NP    1 year ago Essential hypertension   [place], MD       Future Appointments             Today Ladell Pier, MD [place]             cyclobenzaprine (FLEXERIL) 5 MG tablet 30 tablet 0    Sig: Take 1 tablet by mouth daily for 7 days then daily as needed.     Not Delegated - Analgesics:  Muscle Relaxants Failed - 08/30/2021  3:56 PM      Failed - This refill cannot be delegated      Passed - Valid encounter within last 6 months    Recent Outpatient Visits           1 month ago Bilateral neck pain   [place], MD   4 months ago Essential hypertension   [place], MD   8 months ago Annual physical exam  [place] Ladell Pier, MD   11 months ago Right lower quadrant abdominal pain   [place] Fenton Foy, NP   1 year ago Essential hypertension   [place], MD       Future Appointments             Today Ladell Pier, MD [place]            Signed Prescriptions Disp Refills   acyclovir (ZOVIRAX) 400 MG tablet 180 tablet 1    Sig: TAKE 1 TABLET (400 MG TOTAL) BY MOUTH 2 (TWO) TIMES DAILY.     Antimicrobials:  Antiviral Agents - Anti-Herpetic Passed - 08/30/2021  3:56 PM      Passed - Valid encounter within last 12 months    Recent Outpatient Visits           1 month ago Bilateral neck pain   [place], MD   4 months ago Essential hypertension   [place], MD   8 months ago Annual physical exam   [place] Ladell Pier, MD   11 months ago Right lower quadrant abdominal pain    [place] Fenton Foy, NP   1 year ago Essential hypertension   [place], MD       Future Appointments             Today Ladell Pier, MD [place]             tamsulosin (FLOMAX) 0.4 MG CAPS capsule 90 capsule 1    Sig: Take 1 capsule (0.4 mg total) by mouth daily.     Urology: Alpha-Adrenergic Blocker Passed - 08/30/2021  3:56 PM      Passed - PSA in normal range and within 360 days    PSA  Date Value Ref Range Status  05/10/2014 0.55 <=4.00 ng/mL Final    Comment:    Test Methodology: ECLIA PSA (Electrochemiluminescence Immunoassay)   For PSA values from 2.5-4.0, particularly in younger men <73 years old, the AUA and NCCN suggest testing for % Free PSA (3515) and evaluation of the rate of increase in PSA (PSA velocity).    Prostate Specific Ag, Serum  Date Value Ref Range Status  12/19/2020 0.4 0.0 - 4.0 ng/mL Final    Comment:    Roche ECLIA methodology. According to the American Urological Association, Serum PSA should decrease and remain at undetectable levels after radical prostatectomy. The AUA defines biochemical recurrence as an initial PSA value 0.2 ng/mL or greater followed by a subsequent confirmatory PSA value 0.2 ng/mL or greater. Values obtained with different assay methods or kits cannot be used interchangeably. Results cannot be interpreted as absolute evidence of the presence or absence of malignant disease.          Passed - Last BP in normal range    BP Readings from Last 1 Encounters:  07/10/21 120/81         Passed - Valid encounter within last 12 months    Recent Outpatient Visits           1 month ago Bilateral neck pain   [place]  Ladell Pier, MD   4 months ago Essential hypertension   [place], MD   8 months ago  Annual physical exam   [place] Ladell Pier, MD   11 months ago Right lower quadrant abdominal pain   [place] Fenton Foy, NP   1 year ago Essential hypertension   [place], MD       Future Appointments             Today Ladell Pier, MD [place]             atorvastatin (LIPITOR) 20 MG tablet 90 tablet 1    Sig: TAKE 1 TABLET (20 MG TOTAL) BY MOUTH DAILY.     Cardiovascular:  Antilipid - Statins Failed - 08/30/2021  3:56 PM      Failed - Lipid Panel in normal range within the last 12 months    Cholesterol, Total  Date Value Ref Range Status  12/19/2020 140 100 - 199 mg/dL Final   LDL Chol Calc (NIH)  Date Value Ref Range Status  12/19/2020 60 0 - 99 mg/dL Final   HDL  Date Value Ref Range Status  12/19/2020 53 >39 mg/dL Final   Triglycerides  Date Value Ref Range Status  12/19/2020 163 (H) 0 - 149 mg/dL Final         Passed - Patient is not pregnant      Passed - Valid encounter within last 12 months    Recent Outpatient Visits           1 month ago Bilateral neck pain   [place], MD   4 months ago Essential hypertension   [place], MD   8 months ago Annual physical exam   [place] Ladell Pier, MD   11 months ago Right lower quadrant abdominal pain   [place], Kriste Basque, NP   1 year ago Essential hypertension   [place], MD       Future Appointments             Today Ladell Pier, MD [place]

## 2021-08-31 NOTE — Patient Instructions (Signed)

## 2021-08-31 NOTE — Telephone Encounter (Signed)
Requested Prescriptions  Pending Prescriptions Disp Refills  . acyclovir (ZOVIRAX) 400 MG tablet 60 tablet 6    Sig: TAKE 1 TABLET (400 MG TOTAL) BY MOUTH 2 (TWO) TIMES DAILY.     Antimicrobials:  Antiviral Agents - Anti-Herpetic Passed - 08/30/2021  3:56 PM      Passed - Valid encounter within last 12 months    Recent Outpatient Visits          1 month ago Bilateral neck pain   [place], MD   4 months ago Essential hypertension   [place], MD   8 months ago Annual physical exam   [place] Ladell Pier, MD   11 months ago Right lower quadrant abdominal pain   [place], Kriste Basque, NP   1 year ago Essential hypertension   [place], MD      Future Appointments            Today Ladell Pier, MD [place]           . tamsulosin (FLOMAX) 0.4 MG CAPS capsule 30 capsule 6    Sig: Take 1 capsule (0.4 mg total) by mouth daily.     Urology: Alpha-Adrenergic Blocker Passed - 08/30/2021  3:56 PM      Passed - PSA in normal range and within 360 days    PSA  Date Value Ref Range Status  05/10/2014 0.55 <=4.00 ng/mL Final    Comment:    Test Methodology: ECLIA PSA (Electrochemiluminescence Immunoassay)   For PSA values from 2.5-4.0, particularly in younger men <28 years old, the AUA and NCCN suggest testing for % Free PSA (3515) and evaluation of the rate of increase in PSA (PSA velocity).    Prostate Specific Ag, Serum  Date Value Ref Range Status  12/19/2020 0.4 0.0 - 4.0 ng/mL Final    Comment:    Roche ECLIA methodology. According to the American Urological Association, Serum PSA should decrease and remain at undetectable levels after radical prostatectomy. The AUA defines biochemical recurrence as an  initial PSA value 0.2 ng/mL or greater followed by a subsequent confirmatory PSA value 0.2 ng/mL or greater. Values obtained with different assay methods or kits cannot be used interchangeably. Results cannot be interpreted as absolute evidence of the presence or absence of malignant disease.          Passed - Last BP in normal range    BP Readings from Last 1 Encounters:  07/10/21 120/81         Passed - Valid encounter within last 12 months    Recent Outpatient Visits          1 month ago Bilateral neck pain   [place], MD   4 months ago Essential hypertension   [place], MD   8 months ago Annual physical exam   [place] Ladell Pier, MD   11 months ago Right lower quadrant abdominal pain   [place] Fenton Foy, NP   1 year ago Essential hypertension   [place], MD      Future Appointments  Today Ladell Pier, MD [place]           . atorvastatin (LIPITOR) 20 MG tablet 90 tablet 0    Sig: TAKE 1 TABLET (20 MG TOTAL) BY MOUTH DAILY.     Cardiovascular:  Antilipid - Statins Failed - 08/30/2021  3:56 PM      Failed - Lipid Panel in normal range within the last 12 months    Cholesterol, Total  Date Value Ref Range Status  12/19/2020 140 100 - 199 mg/dL Final   LDL Chol Calc (NIH)  Date Value Ref Range Status  12/19/2020 60 0 - 99 mg/dL Final   HDL  Date Value Ref Range Status  12/19/2020 53 >39 mg/dL Final   Triglycerides  Date Value Ref Range Status  12/19/2020 163 (H) 0 - 149 mg/dL Final         Passed - Patient is not pregnant      Passed - Valid encounter within last 12 months    Recent Outpatient Visits          1 month ago Bilateral neck pain   [place], MD   4 months ago Essential hypertension   [place], MD   8 months ago Annual physical exam   [place] Ladell Pier, MD   11 months ago Right lower quadrant abdominal pain   [place], Kriste Basque, NP   1 year ago Essential hypertension   [place], MD      Future Appointments            Today Ladell Pier, MD [place]           . hydrochlorothiazide (HYDRODIURIL) 25 MG tablet 90 tablet 0    Sig: Take 1 tablet (25 mg total) by mouth daily.     Cardiovascular: Diuretics - Thiazide Failed - 08/30/2021  3:56 PM      Failed - Cr in normal range and within 180 days    Creat  Date Value Ref Range Status  09/19/2015 1.09 0.70 - 1.33 mg/dL Final    Comment:      For patients > or = 59 years of age: The upper reference limit for Creatinine is approximately 13% higher for people identified as African-American.      Creatinine, Ser  Date Value Ref Range Status  08/11/2020 0.95 0.76 - 1.27 mg/dL Final   Creatinine, Urine  Date Value Ref Range Status  04/30/2014 397.06 mg/dL Final         Failed - K in normal range and within 180 days    Potassium  Date Value Ref Range Status  08/11/2020 3.8 3.5 - 5.2 mmol/L Final         Failed - Na in normal range and within 180 days    Sodium  Date Value Ref Range Status  08/11/2020 140 134 - 144 mmol/L Final         Passed - Last BP in normal range    BP Readings from Last 1 Encounters:  07/10/21 120/81         Passed - Valid encounter within last 6 months    Recent Outpatient Visits          1 month ago Bilateral neck  pain   [place], MD   4 months ago Essential hypertension   [place], MD   8 months ago Annual physical exam   [place] Ladell Pier, MD   11 months ago Right lower quadrant abdominal pain   [place], Kriste Basque, NP   1 year ago Essential hypertension   [place], MD      Future Appointments            Today Ladell Pier, MD [place]           . cyclobenzaprine (FLEXERIL) 5 MG tablet 30 tablet 0    Sig: Take 1 tablet by mouth daily for 7 days then daily as needed.     Not Delegated - Analgesics:  Muscle Relaxants Failed - 08/30/2021  3:56 PM      Failed - This refill cannot be delegated      Passed - Valid encounter within last 6 months    Recent Outpatient Visits          1 month ago Bilateral neck pain   [place], MD   4 months ago Essential hypertension   [place], MD   8 months ago Annual physical exam   [place] Ladell Pier, MD   11 months ago Right lower quadrant abdominal pain   [place], Kriste Basque, NP   1 year ago Essential hypertension   [place], MD      Future Appointments            Today Ladell Pier, MD [place]

## 2021-09-01 MED ORDER — HYDROCHLOROTHIAZIDE 25 MG PO TABS
25.0000 mg | ORAL_TABLET | Freq: Every day | ORAL | 2 refills | Status: DC
  Filled 2021-09-01: qty 30, 30d supply, fill #0
  Filled 2021-10-01: qty 30, 30d supply, fill #1
  Filled 2021-10-31: qty 30, 30d supply, fill #2
  Filled 2021-11-29: qty 30, 30d supply, fill #3
  Filled 2021-12-28: qty 30, 30d supply, fill #4
  Filled 2022-01-28: qty 30, 30d supply, fill #5
  Filled 2022-02-26 (×2): qty 30, 30d supply, fill #6
  Filled 2022-03-26: qty 30, 30d supply, fill #7
  Filled 2022-04-24: qty 30, 30d supply, fill #8

## 2021-09-01 MED ORDER — CYCLOBENZAPRINE HCL 5 MG PO TABS
ORAL_TABLET | ORAL | 0 refills | Status: DC
  Filled 2021-09-01 – 2021-09-03 (×2): qty 30, 30d supply, fill #0

## 2021-09-03 ENCOUNTER — Other Ambulatory Visit: Payer: Self-pay

## 2021-09-07 ENCOUNTER — Other Ambulatory Visit: Payer: Managed Care, Other (non HMO)

## 2021-09-07 ENCOUNTER — Other Ambulatory Visit: Payer: Self-pay

## 2021-09-13 ENCOUNTER — Encounter: Payer: Self-pay | Admitting: Internal Medicine

## 2021-09-14 ENCOUNTER — Ambulatory Visit: Payer: Managed Care, Other (non HMO) | Attending: Internal Medicine

## 2021-09-14 DIAGNOSIS — I10 Essential (primary) hypertension: Secondary | ICD-10-CM

## 2021-09-14 DIAGNOSIS — E782 Mixed hyperlipidemia: Secondary | ICD-10-CM

## 2021-09-15 LAB — CBC
Hematocrit: 47.1 % (ref 37.5–51.0)
Hemoglobin: 16.1 g/dL (ref 13.0–17.7)
MCH: 30.8 pg (ref 26.6–33.0)
MCHC: 34.2 g/dL (ref 31.5–35.7)
MCV: 90 fL (ref 79–97)
Platelets: 296 10*3/uL (ref 150–450)
RBC: 5.22 x10E6/uL (ref 4.14–5.80)
RDW: 13.9 % (ref 11.6–15.4)
WBC: 7 10*3/uL (ref 3.4–10.8)

## 2021-09-15 LAB — COMPREHENSIVE METABOLIC PANEL
ALT: 23 IU/L (ref 0–44)
AST: 16 IU/L (ref 0–40)
Albumin/Globulin Ratio: 1.8 (ref 1.2–2.2)
Albumin: 4.8 g/dL (ref 3.8–4.9)
Alkaline Phosphatase: 80 IU/L (ref 44–121)
BUN/Creatinine Ratio: 12 (ref 9–20)
BUN: 12 mg/dL (ref 6–24)
Bilirubin Total: 0.7 mg/dL (ref 0.0–1.2)
CO2: 23 mmol/L (ref 20–29)
Calcium: 9.6 mg/dL (ref 8.7–10.2)
Chloride: 101 mmol/L (ref 96–106)
Creatinine, Ser: 1 mg/dL (ref 0.76–1.27)
Globulin, Total: 2.6 g/dL (ref 1.5–4.5)
Glucose: 107 mg/dL — ABNORMAL HIGH (ref 70–99)
Potassium: 3.9 mmol/L (ref 3.5–5.2)
Sodium: 141 mmol/L (ref 134–144)
Total Protein: 7.4 g/dL (ref 6.0–8.5)
eGFR: 87 mL/min/{1.73_m2} (ref >59)

## 2021-09-15 LAB — LIPID PANEL
Chol/HDL Ratio: 3 ratio (ref 0.0–5.0)
Cholesterol, Total: 148 mg/dL (ref 100–199)
HDL: 50 mg/dL (ref >39)
LDL Chol Calc (NIH): 78 mg/dL (ref 0–99)
Triglycerides: 109 mg/dL (ref 0–149)
VLDL Cholesterol Cal: 20 mg/dL (ref 5–40)

## 2021-09-27 ENCOUNTER — Other Ambulatory Visit: Payer: Self-pay

## 2021-10-01 ENCOUNTER — Other Ambulatory Visit: Payer: Self-pay

## 2021-10-02 ENCOUNTER — Encounter: Payer: Self-pay | Admitting: Internal Medicine

## 2021-10-03 ENCOUNTER — Other Ambulatory Visit: Payer: Self-pay | Admitting: Internal Medicine

## 2021-10-03 DIAGNOSIS — G4733 Obstructive sleep apnea (adult) (pediatric): Secondary | ICD-10-CM

## 2021-10-04 ENCOUNTER — Other Ambulatory Visit: Payer: Self-pay

## 2021-10-10 ENCOUNTER — Encounter: Payer: Self-pay | Admitting: Internal Medicine

## 2021-10-10 ENCOUNTER — Other Ambulatory Visit: Payer: Self-pay

## 2021-10-11 ENCOUNTER — Telehealth: Payer: Self-pay

## 2021-10-11 NOTE — Telephone Encounter (Signed)
I spoke to Samuel Irwin/ Samuel Irwin PAP intake and she confirmed that they received the order for the BiPAP on 10/08/2021 and are in the process of verifying insurance.  She said they do not need any additional information from PCP at this time and they should be contacting the patient in 1-2 weeks.   MyChart message sent to patient with this update. I also provided him with the phone number for [facility] if he has questions about the status of his order.

## 2021-10-12 ENCOUNTER — Telehealth: Payer: Self-pay | Admitting: Internal Medicine

## 2021-10-12 NOTE — Telephone Encounter (Signed)
Gae Bon returning [place] phone call /  [name] Received order for Bipap machine but the are Missing clinical notes from most recent visit stating that he is using and benefiting from his machine / fax# [phone]

## 2021-10-15 ENCOUNTER — Other Ambulatory Visit: Payer: Self-pay

## 2021-10-15 NOTE — Telephone Encounter (Signed)
I called the patient and explained the need for the virtual visit with Dr [name] and scheduled him to see her tomorrow -10/16/2021 '@0810'$ 

## 2021-10-16 ENCOUNTER — Ambulatory Visit: Payer: Managed Care, Other (non HMO) | Attending: Internal Medicine | Admitting: Internal Medicine

## 2021-10-16 DIAGNOSIS — G4733 Obstructive sleep apnea (adult) (pediatric): Secondary | ICD-10-CM

## 2021-10-16 NOTE — Progress Notes (Signed)
Patient ID: Samuel Irwin, male   DOB: Mar 30, 1960, 61 y.o.   MRN: [id number] Virtual Visit via Telephone Note  I connected with [name] on 10/16/2021 at 8:17 a.m by telephone and verified that I am speaking with the correct person using two identifiers  Location: Patient: work Provider: office  Participants: Myself Patient   I discussed the limitations, risks, security and privacy concerns of performing an evaluation and management service by telephone and the availability of in person appointments. I also discussed with the patient that there may be a patient responsible charge related to this service. The patient expressed understanding and agreed to proceed.   History of Present Illness: [name], HL, obesity, GERD, ED, tob dep, BPH, OSA on Bipap, Vit D def, dep/anxiety, paranoia (see 08/2017 visit), OA knee, COVID 19 01/2020.  Purpose of today's visit is to assist patient with getting new BiPAP machine from [facility].  Patient had sleep study several years ago that was positive for OSA.  BiPAP was recommended.  Patient got a refurbish machine through our patient assistance program.  The machine stopped working 3 weeks ago.  Up to that point he was using it consistently and states that he was benefiting from its use.  When he was using the machine every night, he felt that he slept better and woke up in the mornings feeling refreshed.  Also reported less daytime sleepiness.  Since not having his machine, he feels he is not sleeping as well and is waking up feeling unrefreshed.  He would like to get a new BiPAP machine as soon as possible.  Last settings that I see in the system were 12/8 cm of water pressure.  Patient states he will try to check the machine that gave out on him to make sure that those were his last settings.  [name] told him that they will need verification from his PCP that he was benefiting from the use of the BiPAP machine.  He wonders about getting  a new sleep study if he continues to lose weight.  Outpatient Encounter Medications as of 10/16/2021  Medication Sig   acyclovir (ZOVIRAX) 400 MG tablet TAKE 1 TABLET (400 MG TOTAL) BY MOUTH 2 (TWO) TIMES DAILY.   aspirin EC 81 MG tablet Take 81 mg by mouth daily.   atorvastatin (LIPITOR) 20 MG tablet TAKE 1 TABLET (20 MG TOTAL) BY MOUTH DAILY.   azelastine (ASTELIN) 0.1 % nasal spray PLACE 2 SPRAYS INTO BOTH NOSTRILS 2 (TWO) TIMES DAILY. USE IN EACH NOSTRIL AS DIRECTED   cyclobenzaprine (FLEXERIL) 5 MG tablet Take 1 tablet by mouth daily for 7 days then daily as needed.   diclofenac Sodium (VOLTAREN) 1 % GEL Apply 2 g topically 3 (three) times daily.   fluticasone (FLONASE) 50 MCG/ACT nasal spray PLACE 2 SPRAYS INTO BOTH NOSTRILS DAILY.   gabapentin (NEURONTIN) 300 MG capsule TAKE 1 CAPSULE (300 MG TOTAL) BY MOUTH AT BEDTIME.   glucosamine-chondroitin 500-400 MG tablet Take 1-2 tablets by mouth daily.   hydrochlorothiazide (HYDRODIURIL) 25 MG tablet Take 1 tablet (25 mg total) by mouth daily.   hydrocortisone (ANUSOL-HC) 2.5 % rectal cream Place 1 application rectally 2 (two) times daily.   ibuprofen (ADVIL) 800 MG tablet Take 800 mg by mouth as needed for moderate pain.   meclizine (ANTIVERT) 25 MG tablet Take 1 tablet (25 mg total) by mouth 3 (three) times daily as needed for dizziness.   nicotine polacrilex (NICORETTE MINI) 2  MG lozenge Use as needed to quit smoking   Olopatadine HCl 0.2 % SOLN Apply 1 drop to eye daily.   omeprazole (PRILOSEC) 20 MG capsule Take 1 capsule (20 mg total) by mouth daily.   predniSONE (DELTASONE) 20 MG tablet Take 2 tablets (40 mg total) by mouth daily.   tadalafil (CIALIS) 10 MG tablet Take 10 mg by mouth daily as needed for erectile dysfunction.   tamsulosin (FLOMAX) 0.4 MG CAPS capsule Take 1 capsule (0.4 mg total) by mouth daily.   No facility-administered encounter medications on file as of 10/16/2021.      Observations/Objective: No direct  observation done as this was a telephone visit.  Assessment and Plan: 1. OSA treated with BiPAP Patient has been using BiPAP machine for several years with good results.  He slept much better and woke up feeling refreshed and had less daytime sleepiness while using his machine.  He was consistent with using the device until it gave out on him about 3 weeks ago.  At this time, I feel he needs another BiPAP device as symptoms of OSA have returned. Advised patient that we will try to get the device for him as soon as possible.  If he finds that in using the device current symptoms persist, then we will pursue repeat sleep study at that time.  He is agreeable to this plan.   Follow Up Instructions: As previously scheduled.   I discussed the assessment and treatment plan with the patient. The patient was provided an opportunity to ask questions and all were answered. The patient agreed with the plan and demonstrated an understanding of the instructions.   The patient was advised to call back or seek an in-person evaluation if the symptoms worsen or if the condition fails to improve as anticipated.  I  Spent 7 minutes on this telephone encounter  This note has been created with Surveyor, quantity. Any transcriptional errors are unintentional.  Karle Plumber, MD

## 2021-10-18 NOTE — Telephone Encounter (Signed)
Visit note from Dr [name] supporting the need for BiPAP , faxed to [place] # [phone].

## 2021-10-23 ENCOUNTER — Other Ambulatory Visit: Payer: Self-pay

## 2021-10-23 ENCOUNTER — Ambulatory Visit (HOSPITAL_COMMUNITY)
Admission: EM | Admit: 2021-10-23 | Discharge: 2021-10-23 | Disposition: A | Discharge status: Home / Self Care | Payer: Managed Care, Other (non HMO) | Attending: Internal Medicine | Admitting: Internal Medicine

## 2021-10-23 ENCOUNTER — Encounter (HOSPITAL_COMMUNITY): Payer: Self-pay

## 2021-10-23 DIAGNOSIS — J069 Acute upper respiratory infection, unspecified: Secondary | ICD-10-CM

## 2021-10-23 DIAGNOSIS — F17210 Nicotine dependence, cigarettes, uncomplicated: Secondary | ICD-10-CM | POA: Diagnosis not present

## 2021-10-23 DIAGNOSIS — U071 COVID-19: Secondary | ICD-10-CM | POA: Insufficient documentation

## 2021-10-23 DIAGNOSIS — R0981 Nasal congestion: Secondary | ICD-10-CM

## 2021-10-23 LAB — SARS CORONAVIRUS 2 (TAT 6-24 HRS): SARS Coronavirus 2: POSITIVE — AB

## 2021-10-23 MED ORDER — BENZONATATE 100 MG PO CAPS
100.0000 mg | ORAL_CAPSULE | Freq: Three times a day (TID) | ORAL | 0 refills | Status: DC
  Filled 2021-10-23: qty 21, 7d supply, fill #0

## 2021-10-23 MED ORDER — GUAIFENESIN ER 1200 MG PO TB12
1200.0000 mg | ORAL_TABLET | Freq: Two times a day (BID) | ORAL | 0 refills | Status: DC
  Filled 2021-10-23: qty 14, 7d supply, fill #0

## 2021-10-23 MED ORDER — ACETAMINOPHEN 500 MG PO TABS
1000.0000 mg | ORAL_TABLET | Freq: Four times a day (QID) | ORAL | 0 refills | Status: DC | PRN
Start: 2021-10-23 — End: 2022-01-05
  Filled 2021-10-23: qty 30, 4d supply, fill #0

## 2021-10-23 MED ORDER — IBUPROFEN 600 MG PO TABS
600.0000 mg | ORAL_TABLET | Freq: Four times a day (QID) | ORAL | 0 refills | Status: DC | PRN
  Filled 2021-10-23: qty 30, 7d supply, fill #0

## 2021-10-23 NOTE — Discharge Instructions (Signed)
You have a viral upper respiratory infection.  You have been tested for COVID-19.  We will call you if your test result is positive in the next 6 to 24 hours.  You may also see your results on MyChart. If you are positive, you are a candidate for Paxlovid prescription. Stay at home/wear mask in public until results come back.  Take guaifenesin '1200mg'$   2 times daily to thin your mucous so that you can cough it up and blow it out of your nose easier. Drink plenty of water while taking this medication so that it works well in your body (at least 8 cups a day).   Take tessalon pearles every 8 hours as needed for cough.  You may take tylenol 1,'000mg'$  and ibuprofen '600mg'$  every 6 hours with food as needed for fever/chills, sore throat, aches/pains, and inflammation associated with viral illness. Take this with food to avoid stomach upset.    You may do salt water and baking soda gargles every 4 hours as needed for your throat pain.  Please put 1 teaspoon of salt and 1/2 teaspoon of baking soda in 8 ounces of warm water then gargle and spit the water out. You may also put 1 tablespoon of honey in warm water and drink this to soothe your throat.  Place a humidifier in your room at night to help decrease dry air that can irritate your airway and cause you to have a sore throat and cough.  Please try to eat a well-balanced diet while you are sick so that your body gets proper nutrition to heal.  If you develop any new or worsening symptoms, please return.  If your symptoms are severe, please go to the emergency room.  Follow-up with your primary care provider for further evaluation and management of your symptoms as well as ongoing wellness visits.  I hope you feel better!

## 2021-10-23 NOTE — ED Provider Notes (Signed)
[place] [place]    CSN: [id number] Arrival date & time: 10/23/21  2248      History   Chief Complaint Chief Complaint  Patient presents with   Cough    HPI Samuel Irwin is a 61 y.o. male.   Patient presents urgent care for evaluation of cough, nasal congestion, chills, body aches, and sore throat that started yesterday upon waking.  Patient went to work yesterday when he started feeling ill.  States it is difficult to breathe through his nose due to nasal congestion that is thick and white/yellow.  Cough is productive with phlegm.  Also reports headache yesterday that resolved with ibuprofen use. Denies known sick contacts.  He has not attempted use of any other over-the-counter medications prior to arrival urgent care for his symptoms.  He is fully vaccinated against COVID-19 and has received 2 boosters.  He is a current every day cigarette smoker and denies history of chronic respiratory problems like asthma.  Denies wheezing, chest pain, weakness, ear pain, neck pain, and dizziness.  No nausea, vomiting, diarrhea, constipation, abdominal discomfort, or urinary symptoms reported.  He has not attempted use of any other over-the-counter medications prior to arrival urgent care other than ibuprofen and denies pain at this time.   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[place]) 09/20/2019   Tobacco use 02/10/2019   Chronic cough 12/29/2018   Other headache syndrome 02/12/2018   Arthritis pain  of hand 02/12/2018   Scrotal mass 08/04/2017   Obesity (BMI 35.0-39.9 without comorbidity) 03/10/2017   Burning sensation of feet 09/23/2016   Floaters in visual field, right 09/23/2016   Osteoarthritis of right knee 02/28/2016   Chronic bilateral low back pain 02/28/2016   Poor dentition 01/29/2016   Essential hypertension 06/06/2015   BPH (benign prostatic hyperplasia) 05/04/2015   Other seasonal allergic rhinitis 05/04/2015   Target of perceived adverse discrimination or persecution 04/05/2015   Hemorrhoids 02/23/2015   Constipation 02/23/2015   GERD (gastroesophageal reflux disease) 09/21/2014   Carpal tunnel syndrome 08/12/2014   Erectile dysfunction 08/20/2013   Hyperlipidemia 08/20/2013   OSA (obstructive sleep apnea) 08/24/2012    Past Surgical History:  Procedure Laterality Date   BIOPSY  10/18/2014   Procedure: BIOPSY (Gastric);  Surgeon: Danie Binder, MD;  Location: [facility];  Service: Endoscopy;;   CARPAL TUNNEL RELEASE     R hand   COLONOSCOPY WITH PROPOFOL N/A 10/18/2014   GNO:IBBCWUGQ size external hemorrhoids/left colon is redundant   ESOPHAGOGASTRODUODENOSCOPY (EGD) WITH PROPOFOL N/A 10/18/2014   SLF: epigastric pain due to moderate NSAID gastritis   FEMUR SURGERY     left   HERNIA REPAIR  04/07/12   umb hernia repair   INSERTION OF MESH N/A 04/07/2012   Procedure: INSERTION OF MESH;  Surgeon: Madilyn Hook, DO;  Location: [facility];  Service: General;  Laterality: N/A;  KNEE ARTHROSCOPY     right   MINOR CARPAL TUNNEL Left 01/31/2016   Procedure: MINOR CARPAL TUNNEL RELEASE LEFT;  Surgeon: Charlotte Crumb, MD;  Location: [facility];  Service: Orthopedics;  Laterality: Left;  Local   TOE SURGERY     dislocated lt foot   UMBILICAL HERNIA REPAIR N/A 04/07/2012   Procedure: HERNIA REPAIR UMBILICAL ADULT;  Surgeon: Madilyn Hook, DO;  Location: [facility];  Service: General;  Laterality: N/A;  open umbilical hernia repair with mesh       Home Medications     Prior to Admission medications   Medication Sig Start Date End Date Taking? Authorizing Provider  acetaminophen (TYLENOL) 500 MG tablet Take 2 tablets (1,000 mg total) by mouth every 6 (six) hours as needed. 10/23/21  Yes Talbot Grumbling, FNP  benzonatate (TESSALON) 100 MG capsule Take 1 capsule (100 mg total) by mouth every 8 (eight) hours. 10/23/21  Yes Talbot Grumbling, FNP  Guaifenesin 1200 MG TB12 Take 1 tablet (1,200 mg total) by mouth in the morning and at bedtime. 10/23/21  Yes Talbot Grumbling, FNP  ibuprofen (ADVIL) 600 MG tablet Take 1 tablet (600 mg total) by mouth every 6 (six) hours as needed. 10/23/21  Yes Talbot Grumbling, FNP  acyclovir (ZOVIRAX) 400 MG tablet TAKE 1 TABLET (400 MG TOTAL) BY MOUTH 2 (TWO) TIMES DAILY. 08/31/21 08/31/22  Ladell Pier, MD  aspirin EC 81 MG tablet Take 81 mg by mouth daily.    [provider]  atorvastatin (LIPITOR) 20 MG tablet TAKE 1 TABLET (20 MG TOTAL) BY MOUTH DAILY. 08/31/21 08/31/22  Ladell Pier, MD  azelastine (ASTELIN) 0.1 % nasal spray PLACE 2 SPRAYS INTO BOTH NOSTRILS 2 (TWO) TIMES DAILY. USE IN [facility] NOSTRIL AS DIRECTED 01/26/20 01/25/21  Ladell Pier, MD  cyclobenzaprine (FLEXERIL) 5 MG tablet Take 1 tablet by mouth daily for 7 days then daily as needed. 09/01/21   Ladell Pier, MD  diclofenac Sodium (VOLTAREN) 1 % GEL Apply 2 g topically 3 (three) times daily. 07/10/21   Ladell Pier, MD  fluticasone (FLONASE) 50 MCG/ACT nasal spray PLACE 2 SPRAYS INTO BOTH NOSTRILS DAILY. 01/26/20   Ladell Pier, MD  gabapentin (NEURONTIN) 300 MG capsule TAKE 1 CAPSULE (300 MG TOTAL) BY MOUTH AT BEDTIME. 05/05/20 05/05/21  Ladell Pier, MD  glucosamine-chondroitin 500-400 MG tablet Take 1-2 tablets by mouth daily.    [provider]  hydrochlorothiazide (HYDRODIURIL) 25 MG tablet Take 1 tablet (25 mg total) by mouth daily. 09/01/21   Ladell Pier, MD  hydrocortisone (ANUSOL-HC) 2.5 %  rectal cream Place 1 application rectally 2 (two) times daily. 12/19/20   Ladell Pier, MD  meclizine (ANTIVERT) 25 MG tablet Take 1 tablet (25 mg total) by mouth 3 (three) times daily as needed for dizziness. 09/08/19   Charlott Rakes, MD  nicotine polacrilex (NICORETTE MINI) 2 MG lozenge Use as needed to quit smoking 08/11/20   Ladell Pier, MD  Olopatadine HCl 0.2 % SOLN Apply 1 drop to eye daily. 05/24/21   Vanessa Kick, MD  omeprazole (PRILOSEC) 20 MG capsule Take 1 capsule (20 mg total) by mouth daily. 06/30/20   Ladell Pier, MD  predniSONE (DELTASONE) 20 MG tablet Take 2 tablets (40 mg total) by mouth daily. 06/19/20   Vanessa Kick, MD  tadalafil (CIALIS) 10 MG tablet Take 10 mg by mouth daily as needed for erectile dysfunction.    [provider]  tamsulosin (FLOMAX) 0.4 MG CAPS capsule Take 1 capsule (0.4 mg total) by mouth daily. 08/31/21   Ladell Pier, MD    Family History Family History  Problem Relation Age of Onset   Hypertension Mother    Cancer Father        lung   Colon cancer Neg Hx     Social History Social History   Tobacco Use   Smoking status: Some Days    Packs/day: 0.25    Years: 43.00    Total pack years: 10.75    Types: Cigarettes   Smokeless tobacco: Never   Tobacco comments:    occas still smokes when he drinks  Vaping Use   Vaping Use: Never used  Substance Use Topics   Alcohol use: Yes    Comment: once weekly   Drug use: Not Currently    Types: Cocaine, "Crack" cocaine    Comment: smoked crack     Allergies   Patient has no known allergies.   Review of Systems Review of Systems  Respiratory:  Positive for cough.   Per HPI   Physical Exam Triage Vital Signs ED Triage Vitals  Enc Vitals Group     BP 10/23/21 0859 (!) 148/86     Pulse Rate 10/23/21 0859 83     Resp 10/23/21 0859 18     Temp 10/23/21 0859 99.6 F (37.6 C)     Temp Source 10/23/21 0859 Oral     SpO2 10/23/21 0859 95 %     Weight --       Height --      Head Circumference --      Peak Flow --      Pain Score 10/23/21 0900 0     Pain Loc --      Pain Edu? --      Excl. in [place]? --    No data found.  Updated Vital Signs BP (!) 148/86 (BP Location: Right Arm)   Pulse 83   Temp 99.6 F (37.6 C) (Oral)   Resp 18   SpO2 95%   Visual Acuity Right Eye Distance:   Left Eye Distance:   Bilateral Distance:    Right Eye Near:   Left Eye Near:    Bilateral Near:     Physical Exam Vitals and nursing note reviewed.  Constitutional:      Appearance: Normal appearance. He is not ill-appearing or toxic-appearing.     Comments: Very pleasant patient sitting on exam in position of comfort table in no acute distress.   HENT:     Head: Normocephalic and atraumatic.     Right Ear: Hearing, tympanic membrane, ear canal and external ear normal.     Left Ear: Hearing, tympanic membrane, ear canal and external ear normal.     Nose: Congestion present.     Mouth/Throat:     Lips: Pink.     Mouth: Mucous membranes are moist.     Pharynx: Posterior oropharyngeal erythema present.     Comments: Mild erythema to posterior oropharynx with small amount of clear postnasal drainage visualized. Airway intact and patent. Eyes:     General: Lids are normal. Vision grossly intact. Gaze aligned appropriately.        Right eye: No discharge.        Left eye: No discharge.     Extraocular Movements: Extraocular movements intact.     Conjunctiva/sclera: Conjunctivae normal.     Pupils: Pupils are  equal, round, and reactive to light.  Cardiovascular:     Rate and Rhythm: Normal rate and regular rhythm.     Heart sounds: Normal heart sounds, S1 normal and S2 normal.  Pulmonary:     Effort: Pulmonary effort is normal. No respiratory distress.     Breath sounds: Normal breath sounds and air entry.     Comments: Clear to auscultation bilaterally.  No acute respiratory distress. Abdominal:     Palpations: Abdomen is soft.  Musculoskeletal:      Cervical back: Neck supple.  Lymphadenopathy:     Cervical: No cervical adenopathy.  Skin:    General: Skin is warm and dry.     Capillary Refill: Capillary refill takes less than 2 seconds.     Findings: No rash.  Neurological:     General: No focal deficit present.     Mental Status: He is alert and oriented to person, place, and time. Mental status is at baseline.     Cranial Nerves: No dysarthria or facial asymmetry.     Motor: No weakness.     Gait: Gait is intact. Gait normal.  Psychiatric:        Mood and Affect: Mood normal.        Speech: Speech normal.        Behavior: Behavior normal.        Thought Content: Thought content normal.        Judgment: Judgment normal.      UC Treatments / Results  Labs (all labs ordered are listed, but only abnormal results are displayed) Labs Reviewed  SARS CORONAVIRUS 2 (TAT 6-24 HRS)    EKG   Radiology No results found.  Procedures Procedures (including critical care time)  Medications Ordered in UC Medications - No data to display  Initial Impression / Assessment and Plan / UC Course  I have reviewed the triage vital signs and the nursing notes.  Pertinent labs & imaging results that were available during my care of the patient were reviewed by me and considered in my medical decision making (see chart for details).   1.  Viral URI with cough Symptoms and physical exam consistent with a viral upper respiratory tract infection that will likely resolve with rest, fluids, and prescriptions for symptomatic relief.  COVID-19 testing is pending.  We will call patient if this is positive.  He is a candidate for Paxlovid prescription and would benefit from this to prevent severe COVID-19 illness if test is positive.  Deferred imaging based on stable cardiopulmonary exam and hemodynamically stable vital signs.  Guaifenesin, Tessalon Perles, and Tylenol/ibuprofen sent to the pharmacy to be used for nasal congestion, cough, and  body aches, fever/chills, and discomfort associated with viral illness.  Nonpharmacologic interventions for symptom relief provided and after visit summary below.  Strict ED/urgent care return precautions given.  Patient verbalizes understanding and agreement with plan.  Counseled patient regarding possible side effects and uses of all medications prescribed at today's visit.  Patient verbalizes understanding and agreement with plan.  All questions answered.  Patient discharged from urgent care in stable condition.    Final Clinical Impressions(s) / UC Diagnoses   Final diagnoses:  Viral URI with cough  Nasal congestion     Discharge Instructions      You have a viral upper respiratory infection.  You have been tested for COVID-19.  We will call you if your test result is positive in the next 6 to 24 hours.  You may also see your results on MyChart. If you are positive, you are a candidate for Paxlovid prescription. Stay at home/wear mask in public until results come back.  Take guaifenesin '1200mg'$   2 times daily to thin your mucous so that you can cough it up and blow it out of your nose easier. Drink plenty of water while taking this medication so that it works well in your body (at least 8 cups a day).   Take tessalon pearles every 8 hours as needed for cough.  You may take tylenol 1,'000mg'$  and ibuprofen '600mg'$  every 6 hours with food as needed for fever/chills, sore throat, aches/pains, and inflammation associated with viral illness. Take this with food to avoid stomach upset.    You may do salt water and baking soda gargles every 4 hours as needed for your throat pain.  Please put 1 teaspoon of salt and 1/2 teaspoon of baking soda in 8 ounces of warm water then gargle and spit the water out. You may also put 1 tablespoon of honey in warm water and drink this to soothe your throat.  Place a humidifier in your room at night to help decrease dry air that can irritate your airway and cause you  to have a sore throat and cough.  Please try to eat a well-balanced diet while you are sick so that your body gets proper nutrition to heal.  If you develop any new or worsening symptoms, please return.  If your symptoms are severe, please go to the emergency room.  Follow-up with your primary care provider for further evaluation and management of your symptoms as well as ongoing wellness visits.  I hope you feel better!      ED Prescriptions     Medication Sig Dispense Auth. Provider   benzonatate (TESSALON) 100 MG capsule Take 1 capsule (100 mg total) by mouth every 8 (eight) hours. 21 capsule Joella Prince M, FNP   Guaifenesin 1200 MG TB12 Take 1 tablet (1,200 mg total) by mouth in the morning and at bedtime. 14 tablet Joella Prince M, FNP   acetaminophen (TYLENOL) 500 MG tablet Take 2 tablets (1,000 mg total) by mouth every 6 (six) hours as needed. 30 tablet Joella Prince M, FNP   ibuprofen (ADVIL) 600 MG tablet Take 1 tablet (600 mg total) by mouth every 6 (six) hours as needed. 30 tablet Talbot Grumbling, FNP      PDMP not reviewed this encounter.   Talbot Grumbling, [place] 10/23/21 1001

## 2021-10-23 NOTE — ED Triage Notes (Signed)
Pt states cough,congestion,headache,sob,body aches since yesterday.

## 2021-10-24 ENCOUNTER — Other Ambulatory Visit: Payer: Self-pay

## 2021-10-24 ENCOUNTER — Telehealth (HOSPITAL_COMMUNITY): Payer: Self-pay | Admitting: Emergency Medicine

## 2021-10-24 MED ORDER — NIRMATRELVIR/RITONAVIR (PAXLOVID)TABLET
3.0000 | ORAL_TABLET | Freq: Two times a day (BID) | ORAL | 0 refills | Status: AC
  Filled 2021-10-24: qty 30, 5d supply, fill #0

## 2021-10-31 ENCOUNTER — Other Ambulatory Visit: Payer: Self-pay | Admitting: Internal Medicine

## 2021-10-31 ENCOUNTER — Other Ambulatory Visit: Payer: Self-pay

## 2021-10-31 DIAGNOSIS — R208 Other disturbances of skin sensation: Secondary | ICD-10-CM

## 2021-10-31 MED ORDER — GABAPENTIN 300 MG PO CAPS
ORAL_CAPSULE | Freq: Every day | ORAL | 3 refills | Status: DC
  Filled 2021-10-31: qty 30, 30d supply, fill #0

## 2021-10-31 MED ORDER — OMEPRAZOLE 20 MG PO CPDR
20.0000 mg | DELAYED_RELEASE_CAPSULE | Freq: Every day | ORAL | 3 refills | Status: DC
  Filled 2021-10-31: qty 30, 30d supply, fill #0

## 2021-10-31 MED ORDER — AZELASTINE HCL 0.1 % NA SOLN
NASAL | 0 refills | Status: DC
  Filled 2021-10-31: qty 30, 30d supply, fill #0

## 2021-11-01 ENCOUNTER — Other Ambulatory Visit: Payer: Self-pay

## 2021-11-05 ENCOUNTER — Other Ambulatory Visit: Payer: Self-pay

## 2021-11-12 ENCOUNTER — Other Ambulatory Visit: Payer: Self-pay

## 2021-11-23 ENCOUNTER — Other Ambulatory Visit: Payer: Self-pay

## 2021-11-29 ENCOUNTER — Other Ambulatory Visit: Payer: Self-pay

## 2021-12-03 ENCOUNTER — Other Ambulatory Visit: Payer: Self-pay

## 2021-12-10 ENCOUNTER — Encounter: Payer: Self-pay | Admitting: Internal Medicine

## 2021-12-11 ENCOUNTER — Encounter: Payer: Self-pay | Admitting: Internal Medicine

## 2021-12-13 ENCOUNTER — Telehealth: Payer: Self-pay

## 2021-12-13 NOTE — Telephone Encounter (Signed)
I spoke to [place] who stated that the patient received his BiPAP machine.

## 2021-12-21 ENCOUNTER — Ambulatory Visit: Payer: Managed Care, Other (non HMO) | Attending: Internal Medicine

## 2021-12-21 DIAGNOSIS — Z23 Encounter for immunization: Secondary | ICD-10-CM

## 2021-12-28 ENCOUNTER — Other Ambulatory Visit: Payer: Self-pay

## 2022-01-04 ENCOUNTER — Encounter: Payer: Self-pay | Admitting: Internal Medicine

## 2022-01-04 ENCOUNTER — Ambulatory Visit: Payer: Managed Care, Other (non HMO) | Attending: Internal Medicine | Admitting: Internal Medicine

## 2022-01-04 VITALS — BP 120/74 | HR 69 | Temp 98.1°F | Ht 68.0 in | Wt 240.0 lb

## 2022-01-04 DIAGNOSIS — R151 Fecal smearing: Secondary | ICD-10-CM

## 2022-01-04 DIAGNOSIS — G5602 Carpal tunnel syndrome, left upper limb: Secondary | ICD-10-CM | POA: Diagnosis not present

## 2022-01-04 DIAGNOSIS — E782 Mixed hyperlipidemia: Secondary | ICD-10-CM | POA: Diagnosis not present

## 2022-01-04 DIAGNOSIS — Z6836 Body mass index (BMI) 36.0-36.9, adult: Secondary | ICD-10-CM

## 2022-01-04 DIAGNOSIS — I10 Essential (primary) hypertension: Secondary | ICD-10-CM | POA: Diagnosis not present

## 2022-01-04 DIAGNOSIS — Z720 Tobacco use: Secondary | ICD-10-CM
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
## 2022-01-05 ENCOUNTER — Encounter: Payer: Self-pay | Admitting: Internal Medicine

## 2022-01-07 ENCOUNTER — Encounter: Payer: Self-pay | Admitting: Gastroenterology

## 2022-01-28 ENCOUNTER — Other Ambulatory Visit: Payer: Self-pay

## 2022-01-29 ENCOUNTER — Other Ambulatory Visit: Payer: Self-pay

## 2022-02-12 ENCOUNTER — Encounter: Payer: Self-pay | Admitting: Internal Medicine

## 2022-02-13 ENCOUNTER — Other Ambulatory Visit: Payer: Self-pay | Admitting: Internal Medicine

## 2022-02-13 ENCOUNTER — Ambulatory Visit: Payer: Managed Care, Other (non HMO) | Admitting: Gastroenterology

## 2022-02-13 DIAGNOSIS — G5602 Carpal tunnel syndrome, left upper limb: Secondary | ICD-10-CM

## 2022-02-15 ENCOUNTER — Other Ambulatory Visit: Payer: Self-pay | Admitting: Internal Medicine

## 2022-02-15 DIAGNOSIS — G5602 Carpal tunnel syndrome, left upper limb: Secondary | ICD-10-CM

## 2022-02-26 ENCOUNTER — Other Ambulatory Visit: Payer: Self-pay | Admitting: Internal Medicine

## 2022-02-26 ENCOUNTER — Encounter: Payer: Self-pay | Admitting: Internal Medicine

## 2022-02-26 ENCOUNTER — Other Ambulatory Visit: Payer: Self-pay

## 2022-02-26 DIAGNOSIS — N400 Enlarged prostate without lower urinary tract symptoms: Secondary | ICD-10-CM

## 2022-02-26 MED ORDER — ATORVASTATIN CALCIUM 20 MG PO TABS
20.0000 mg | ORAL_TABLET | Freq: Every day | ORAL | 1 refills | Status: DC
  Filled 2022-02-26: qty 30, 30d supply, fill #0
  Filled 2022-03-26: qty 30, 30d supply, fill #1
  Filled 2022-04-24: qty 30, 30d supply, fill #2
  Filled 2022-05-23: qty 30, 30d supply, fill #3
  Filled 2022-07-09: qty 30, 30d supply, fill #4
  Filled 2022-08-07: qty 30, 30d supply, fill #5

## 2022-02-26 MED ORDER — TAMSULOSIN HCL 0.4 MG PO CAPS
0.4000 mg | ORAL_CAPSULE | Freq: Every day | ORAL | 1 refills | Status: DC
  Filled 2022-02-26 (×3): qty 30, 30d supply, fill #0
  Filled 2022-03-26: qty 30, 30d supply, fill #1
  Filled 2022-04-24: qty 30, 30d supply, fill #2
  Filled 2022-05-23: qty 30, 30d supply, fill #3
  Filled 2022-07-09: qty 30, 30d supply, fill #4
  Filled 2022-08-07: qty 30, 30d supply, fill #5

## 2022-02-26 MED ORDER — ACYCLOVIR 400 MG PO TABS
400.0000 mg | ORAL_TABLET | Freq: Two times a day (BID) | ORAL | 1 refills | Status: DC
  Filled 2022-02-26 (×2): qty 60, 30d supply, fill #0
  Filled 2022-03-26: qty 60, 30d supply, fill #1
  Filled 2022-04-24: qty 60, 30d supply, fill #2
  Filled 2022-05-23: qty 60, 30d supply, fill #3
  Filled 2022-07-09: qty 60, 30d supply, fill #4
  Filled 2022-08-07: qty 60, 30d supply, fill #5

## 2022-02-27 ENCOUNTER — Other Ambulatory Visit: Payer: Self-pay

## 2022-03-01 ENCOUNTER — Ambulatory Visit: Payer: Managed Care, Other (non HMO) | Admitting: Gastroenterology

## 2022-03-14 ENCOUNTER — Ambulatory Visit: Payer: Managed Care, Other (non HMO) | Admitting: Gastroenterology

## 2022-03-26 ENCOUNTER — Other Ambulatory Visit: Payer: Self-pay

## 2022-04-04 ENCOUNTER — Encounter: Payer: Self-pay | Admitting: Internal Medicine

## 2022-04-04 ENCOUNTER — Ambulatory Visit: Payer: Managed Care, Other (non HMO) | Attending: Internal Medicine | Admitting: Internal Medicine

## 2022-04-04 VITALS — BP 116/70 | HR 63 | Ht 68.0 in | Wt 236.0 lb

## 2022-04-04 DIAGNOSIS — E785 Hyperlipidemia, unspecified: Secondary | ICD-10-CM

## 2022-04-04 DIAGNOSIS — I10 Essential (primary) hypertension: Secondary | ICD-10-CM

## 2022-04-04 DIAGNOSIS — G4733 Obstructive sleep apnea (adult) (pediatric): Secondary | ICD-10-CM

## 2022-04-04 NOTE — Patient Instructions (Signed)
Medication Instructions:  Your physician recommends that you continue on your current medications as directed. Please refer to the Current Medication list given to you today.  *If you need a refill on your cardiac medications before your next appointment, please call your pharmacy*   Follow-Up: At [facility], you and your health needs are our priority.  As part of our continuing mission to provide you with exceptional heart care, we have created designated Provider Care Teams.  These Care Teams include your primary Cardiologist (physician) and Advanced Practice Providers (APPs -  Physician Assistants and Nurse Practitioners) who all work together to provide you with the care you need, when you need it.   Your next appointment:   3 months
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
## 2022-04-11 ENCOUNTER — Ambulatory Visit: Payer: Managed Care, Other (non HMO) | Admitting: Gastroenterology

## 2022-04-17 ENCOUNTER — Other Ambulatory Visit: Payer: Self-pay | Admitting: Internal Medicine

## 2022-04-17 ENCOUNTER — Encounter: Payer: Self-pay | Admitting: Internal Medicine

## 2022-04-17 DIAGNOSIS — M25561 Pain in right knee: Secondary | ICD-10-CM

## 2022-04-19 ENCOUNTER — Ambulatory Visit (HOSPITAL_COMMUNITY): Payer: Managed Care, Other (non HMO)

## 2022-05-03 ENCOUNTER — Ambulatory Visit: Payer: Managed Care, Other (non HMO) | Admitting: Orthopaedic Surgery

## 2022-05-09 ENCOUNTER — Ambulatory Visit: Payer: Managed Care, Other (non HMO) | Admitting: Gastroenterology

## 2022-05-10 ENCOUNTER — Ambulatory Visit: Payer: Managed Care, Other (non HMO) | Attending: Internal Medicine | Admitting: Internal Medicine

## 2022-05-10 ENCOUNTER — Encounter: Payer: Self-pay | Admitting: Internal Medicine

## 2022-05-10 VITALS — BP 126/83 | HR 76 | Temp 98.3°F | Resp 16 | Ht 65.0 in | Wt 236.0 lb

## 2022-05-10 DIAGNOSIS — I10 Essential (primary) hypertension: Secondary | ICD-10-CM

## 2022-05-10 DIAGNOSIS — R109 Unspecified abdominal pain: Secondary | ICD-10-CM

## 2022-05-10 DIAGNOSIS — Z720 Tobacco use: Secondary | ICD-10-CM | POA: Diagnosis not present

## 2022-05-10 DIAGNOSIS — G4489 Other headache syndrome: Secondary | ICD-10-CM

## 2022-05-10 DIAGNOSIS — K6289 Other specified diseases of anus and rectum: Secondary | ICD-10-CM

## 2022-05-10 DIAGNOSIS — G4733 Obstructive sleep apnea (adult) (pediatric): Secondary | ICD-10-CM | POA: Diagnosis not present

## 2022-05-10 NOTE — Progress Notes (Unsigned)
Today patient has concerns with: - frequent headaches  -anal pain- next colonoscopy is due 10/17/2024.  -lower back discomfort with workouts  Request refill on Cialis

## 2022-05-10 NOTE — Progress Notes (Unsigned)
Patient ID: Samuel Irwin, male    DOB: Jun 22, 1960  MRN: [id number]  CC: Hypertension and Medication Refill   Subjective: Samuel Irwin is a 62 y.o. male who presents for His concerns today include:  [name], HL, obesity, GERD, ED, tob dep, BPH, OSA on Bipap, Vit D def, dep/anxiety, paranoia (see 08/2017 visit), OA knee, COVID 19 01/2020.    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[place]) 09/20/2019   Tobacco use 02/10/2019   Chronic cough 12/29/2018   Other headache syndrome 02/12/2018   Arthritis pain of hand 02/12/2018   Scrotal mass 08/04/2017   Obesity (BMI 35.0-39.9 without comorbidity) 03/10/2017   Burning sensation of feet 09/23/2016   Floaters in visual field, right 09/23/2016   Osteoarthritis of right knee 02/28/2016   Chronic bilateral low back pain 02/28/2016   Poor dentition 01/29/2016   Essential hypertension 06/06/2015   BPH (benign prostatic hyperplasia) 05/04/2015   Other seasonal allergic rhinitis 05/04/2015   Target of perceived adverse discrimination or persecution 04/05/2015   Hemorrhoids 02/23/2015   Constipation 02/23/2015   GERD (gastroesophageal reflux disease) 09/21/2014   Carpal tunnel syndrome 08/12/2014   Erectile dysfunction 08/20/2013   Hyperlipidemia 08/20/2013   OSA (obstructive sleep apnea) 08/24/2012     Current Outpatient Medications on File Prior to Visit  Medication Sig Dispense Refill   acyclovir (ZOVIRAX) 400 MG tablet Take 1 tablet (400 mg total) by mouth 2 (two) times daily. 180 tablet 1   aspirin EC 81 MG tablet Take 81 mg by mouth daily.     atorvastatin (LIPITOR) 20 MG tablet Take 1 tablet (20 mg total) by mouth daily. 90 tablet 1   azelastine (ASTELIN) 0.1 % nasal spray PLACE 2 SPRAYS  INTO BOTH NOSTRILS 2 (TWO) TIMES DAILY. USE IN EACH NOSTRIL AS DIRECTED 30 mL 0   cyclobenzaprine (FLEXERIL) 5 MG tablet Take 1 tablet by mouth daily for 7 days then daily as needed. 30 tablet 0   diclofenac Sodium (VOLTAREN) 1 % GEL Apply 2 g topically 3 (three) times daily. 100 g 0   fluticasone (FLONASE) 50 MCG/ACT nasal spray PLACE 2 SPRAYS INTO BOTH NOSTRILS DAILY. 16 g 2   hydrochlorothiazide (HYDRODIURIL) 25 MG tablet Take 1 tablet (25 mg total) by mouth daily. 90 tablet 2   tadalafil (CIALIS) 10 MG tablet Take 10 mg by mouth daily as needed for erectile dysfunction.     tamsulosin (FLOMAX) 0.4 MG CAPS capsule Take 1 capsule (0.4 mg total) by mouth daily. 90 capsule 1   No current facility-administered medications on file prior to visit.    No Known Allergies  Social History   Socioeconomic History   Marital status: Single    Spouse name: Not on file   Number of children: Not on file   Years of education: Not on file   Highest education level: Not on file  Occupational History   Occupation: unemployed  Tobacco Use   Smoking status: Some Days    Packs/day: 0.25    Years: 43.00    Additional pack years: 0.00    Total pack years: 10.75    Types: Cigarettes   Smokeless tobacco: Never   Tobacco comments:    occas still smokes when he drinks  Vaping Use   Vaping Use: Never used  Substance and Sexual Activity   Alcohol use: Yes    Comment: once weekly   Drug use: Not Currently    Types: Cocaine, "Crack" cocaine    Comment: smoked crack   Sexual activity: Not Currently    Comment: "when I drink"  Other Topics Concern   Not on file  Social History Narrative   Not on file   Social Determinants of Health   Financial Resource Strain: Not on file  Food Insecurity: Not on file  Transportation Needs: Not on file  Physical Activity: Not on file  Stress: Not on file  Social Connections: Not on file  Intimate Partner Violence: Not on file    Family History  Problem  Relation Age of Onset   Hypertension Mother    Cancer Father        lung   Colon cancer Neg Hx     Past Surgical History:  Procedure Laterality Date   BIOPSY  10/18/2014   Procedure: BIOPSY (Gastric);  Surgeon: Danie Binder, MD;  Location: [facility];  Service: Endoscopy;;   CARPAL TUNNEL RELEASE     R hand   COLONOSCOPY WITH PROPOFOL N/A 10/18/2014   [id number] size external hemorrhoids/left colon is redundant   ESOPHAGOGASTRODUODENOSCOPY (EGD) WITH PROPOFOL N/A 10/18/2014   SLF: epigastric pain due to moderate NSAID gastritis   FEMUR SURGERY     left   HERNIA REPAIR  04/07/12   umb hernia repair   INSERTION OF MESH N/A 04/07/2012   Procedure: INSERTION OF MESH;  Surgeon: Madilyn Hook, DO;  Location: [facility];  Service: General;  Laterality: N/A;   KNEE ARTHROSCOPY     right   MINOR CARPAL TUNNEL Left 01/31/2016   Procedure: MINOR CARPAL TUNNEL RELEASE LEFT;  Surgeon: Charlotte Crumb, MD;  Location: [facility];  Service: Orthopedics;  Laterality: Left;  Local   TOE SURGERY     dislocated lt foot   UMBILICAL HERNIA REPAIR N/A 04/07/2012   Procedure: HERNIA REPAIR UMBILICAL ADULT;  Surgeon: Madilyn Hook, DO;  Location: [facility];  Service: General;  Laterality: N/A;  open umbilical hernia repair with mesh    ROS: Review of Systems Negative except as stated above  PHYSICAL EXAM: BP 126/83 (BP Location: Left Arm, Patient Position: Sitting, Cuff Size: Large)   Pulse 76   Temp 98.3 F (36.8 C) (Oral)   Resp 16   Ht 5\' 5"  (1.651 m)   Wt 236 lb (107 kg)   SpO2 95%   BMI 39.27 kg/m   Physical Exam  {male adult master:310786} {male adult master:310785}     Latest Ref Rng & Units 09/14/2021    9:10 AM 08/11/2020    2:16 PM 09/20/2019    4:47 PM  CMP  Glucose 70 - 99 mg/dL 107  102  110   BUN 6 - 24 mg/dL 12  10  18    Creatinine 0.76 - 1.27 mg/dL 1.00  0.95  1.08   Sodium 134 - 144 mmol/L 141  140  140   Potassium 3.5 - 5.2 mmol/L 3.9  3.8  3.6   Chloride 96 -  106 mmol/L 101  100  101   CO2 20 - 29 mmol/L 23  24  25    Calcium 8.7 - 10.2 mg/dL 9.6  10.0  9.8   Total Protein 6.0 - 8.5 g/dL 7.4  7.4  7.3   Total Bilirubin 0.0 - 1.2 mg/dL 0.7  0.8  0.4   Alkaline Phos 44 - 121 IU/L 80  71  76   AST 0 - 40 IU/L 16  16  13    ALT 0 - 44 IU/L 23  21  18     Lipid Panel     Component Value Date/Time   CHOL 148 09/14/2021 0910   TRIG 109 09/14/2021 0910   HDL 50 09/14/2021 0910   CHOLHDL 3.0 09/14/2021 0910   CHOLHDL 4.2 03/09/2014 1241   VLDL 36 03/09/2014 1241   LDLCALC 78 09/14/2021 0910    CBC    Component Value Date/Time   WBC 7.0 09/14/2021 0910   WBC 5.7 12/27/2016 1424   RBC 5.22 09/14/2021 0910   RBC 4.92 12/27/2016 1424   HGB 16.1 09/14/2021 0910   HCT 47.1 09/14/2021 0910   PLT 296 09/14/2021 0910   MCV 90 09/14/2021 0910   MCH 30.8 09/14/2021 0910   MCH 30.5 12/27/2016 1424   MCHC 34.2 09/14/2021 0910   MCHC 33.6 12/27/2016 1424   RDW 13.9 09/14/2021 0910   LYMPHSABS 1.9 04/30/2014 1246   MONOABS 1.9 (H) 04/30/2014 1246   EOSABS 0.0 04/30/2014 1246   BASOSABS 0.0 04/30/2014 1246    ASSESSMENT AND PLAN:  There are no diagnoses linked to this encounter.   Patient was given the opportunity to ask questions.  Patient verbalized understanding of the plan and was able to repeat key elements of the plan.   This documentation was completed using Radio producer.  Any transcriptional errors are unintentional.  No orders of the defined types were placed in this encounter.    Requested Prescriptions    No prescriptions requested or ordered in this encounter    No follow-ups on file.  Karle Plumber, MD, FACP

## 2022-05-11 ENCOUNTER — Encounter: Payer: Self-pay | Admitting: Internal Medicine

## 2022-05-22 NOTE — Progress Notes (Deleted)
GI Office Note    Referring Provider: Ladell Pier, MD Primary Care Physician:  Ladell Pier, MD  Primary Gastroenterologist: Elon Alas. Abbey Chatters, DO, previously Dr. Oneida Alar  Chief Complaint   Chief Complaint  Patient presents with   fecal smearing    Patient here today due having issues with fecal smearing.Patient says he eats a lot of fruits and is regular. He says he has had some issues with extreme rectal pain several times.    History of Present Illness   Samuel Irwin is a 62 y.o. male presenting today at the request of Ladell Pier, MD for ***fecal smearing.  EGD 10/18/22: -***  Colonoscopy 10/18/14: -moderate internal and external hemorrhoids -redundant left colon -repeat in 10 years  Last office visit 11/20/16. ***  Today: Eats lots of fruits and vegetables. Eats more fruits than vegetables (bananas, apples, grapes). Tries to do this to help keep him regular. Is having leekage or seepage in his underwear. He has to wipe multiple times in a row after using the bathroom. When bending over to pick up things or do sit ups at the gym he is having streaks in his underwear.   Reports he is gaining weight right now. Used to being in the gym and swimming but now he is not very active.   Has had rectal pain described as 8/10 and will last for a while. Last instance was about 1 month or a little longer ago. It occurred in the gym. States this also happened in the gym as well. Was just getting dressed or undressed to take a shower. Has had some mild rectal bleeding. Pain usually only lasts for a few minutes with the last one that lasted about 8 minutes. Pain has had about 3 times in the last year.   Bleeding not present with every bowel movement.  Usually only on toilet tissue.   Current Outpatient Medications  Medication Sig Dispense Refill   acyclovir (ZOVIRAX) 400 MG tablet Take 1 tablet (400 mg total) by mouth 2 (two) times daily. 180 tablet 1    atorvastatin (LIPITOR) 20 MG tablet Take 1 tablet (20 mg total) by mouth daily. 90 tablet 1   azelastine (ASTELIN) 0.1 % nasal spray PLACE 2 SPRAYS INTO BOTH NOSTRILS 2 (TWO) TIMES DAILY. USE IN EACH NOSTRIL AS DIRECTED 30 mL 0   diclofenac Sodium (VOLTAREN) 1 % GEL Apply 2 g topically 3 (three) times daily. 100 g 0   fluticasone (FLONASE) 50 MCG/ACT nasal spray PLACE 2 SPRAYS INTO BOTH NOSTRILS DAILY. 16 g 2   hydrochlorothiazide (HYDRODIURIL) 25 MG tablet Take 1 tablet (25 mg total) by mouth daily. 90 tablet 1   tamsulosin (FLOMAX) 0.4 MG CAPS capsule Take 1 capsule (0.4 mg total) by mouth daily. 90 capsule 1   tadalafil (CIALIS) 10 MG tablet Take 10 mg by mouth daily as needed for erectile dysfunction. (Patient not taking: Reported on 05/23/2022)     No current facility-administered medications for this visit.    Past Medical History:  Diagnosis Date   Constipation    Cough    Generalized headaches    GERD (gastroesophageal reflux disease)    H/O hiatal hernia    Heart murmur    Herpes    Hypertension    Sleep apnea    uses BIPAP   Umbilical hernia    Weakness    Wheezing     Past Surgical History:  Procedure Laterality Date   BIOPSY  10/18/2014   Procedure: BIOPSY (Gastric);  Surgeon: Danie Binder, MD;  Location: [facility];  Service: Endoscopy;;   CARPAL TUNNEL RELEASE     R hand   COLONOSCOPY WITH PROPOFOL N/A 10/18/2014   [id number] size external hemorrhoids/left colon is redundant   ESOPHAGOGASTRODUODENOSCOPY (EGD) WITH PROPOFOL N/A 10/18/2014   SLF: epigastric pain due to moderate NSAID gastritis   FEMUR SURGERY     left   HERNIA REPAIR  04/07/12   umb hernia repair   INSERTION OF MESH N/A 04/07/2012   Procedure: INSERTION OF MESH;  Surgeon: Madilyn Hook, DO;  Location: [facility];  Service: General;  Laterality: N/A;   KNEE ARTHROSCOPY     right   MINOR CARPAL TUNNEL Left 01/31/2016   Procedure: MINOR CARPAL TUNNEL RELEASE LEFT;  Surgeon: Charlotte Crumb, MD;  Location:  [facility];  Service: Orthopedics;  Laterality: Left;  Local   TOE SURGERY     dislocated lt foot   UMBILICAL HERNIA REPAIR N/A 04/07/2012   Procedure: HERNIA REPAIR UMBILICAL ADULT;  Surgeon: Madilyn Hook, DO;  Location: [facility];  Service: General;  Laterality: N/A;  open umbilical hernia repair with mesh    Family History  Problem Relation Age of Onset   Hypertension Mother    Cancer Father        lung   Colon cancer Neg Hx     Allergies as of 05/23/2022   (No Known Allergies)    Social History   Socioeconomic History   Marital status: Single    Spouse name: Not on file   Number of children: Not on file   Years of education: Not on file   Highest education level: Not on file  Occupational History   Occupation: unemployed  Tobacco Use   Smoking status: Some Days    Packs/day: 0.25    Years: 43.00    Additional pack years: 0.00    Total pack years: 10.75    Types: Cigarettes   Smokeless tobacco: Never   Tobacco comments:    occas still smokes when he drinks  Vaping Use   Vaping Use: Never used  Substance and Sexual Activity   Alcohol use: Yes    Comment: once weekly   Drug use: Not Currently    Types: Cocaine, "Crack" cocaine    Comment: smoked crack   Sexual activity: Not Currently    Comment: "when I drink"  Other Topics Concern   Not on file  Social History Narrative   Not on file   Social Determinants of Health   Financial Resource Strain: Not on file  Food Insecurity: Not on file  Transportation Needs: Not on file  Physical Activity: Not on file  Stress: Not on file  Social Connections: Not on file  Intimate Partner Violence: Not on file     Review of Systems  *** Gen: Denies any fever, chills, fatigue, weight loss, lack of appetite.  CV: Denies chest pain, heart palpitations, peripheral edema, syncope.  Resp: Denies shortness of breath at rest or with exertion. Denies wheezing or cough.  GI: see HPI GU : Denies urinary burning,  urinary frequency, urinary hesitancy MS: Denies joint pain, muscle weakness, cramps, or limitation of movement.  Derm: Denies rash, itching, dry skin Psych: Denies depression, anxiety, memory loss, and confusion Heme: Denies bruising, bleeding, and enlarged lymph nodes.   Physical Exam   BP 123/81 (BP Location: Left Arm, Patient Position: Sitting, Cuff Size: Large)   Pulse 73  Temp 98.4 F (36.9 C) (Temporal)   Ht 5\' 8"  (1.727 m)   Wt 240 lb (108.9 kg)   BMI 36.49 kg/m   General:   Alert and oriented. Pleasant and cooperative. Well-nourished and well-developed.  Head:  Normocephalic and atraumatic. Eyes:  Without icterus, sclera clear and conjunctiva pink.  Ears:  Normal auditory acuity. Mouth:  No deformity or lesions, oral mucosa pink.  Lungs:  Clear to auscultation bilaterally. No wheezes, rales, or rhonchi. No distress.  Heart:  S1, S2 present without murmurs appreciated.  Abdomen:  +BS, soft, non-tender and non-distended. No HSM noted. No guarding or rebound. No masses appreciated.  Rectal:  no external hemorrhoids. No rectal mass. Good rectal tone. Evidence of small hemorrhoids felt on DRE.  Msk:  Symmetrical without gross deformities. Normal posture. Extremities:  Without edema. Neurologic:  Alert and  oriented x4;  grossly normal neurologically. Skin:  Intact without significant lesions or rashes. Psych:  Alert and cooperative. Normal mood and affect.   Assessment    Samuel Irwin is a 62 y.o. male with a history of constipation, HTN, GERD presenting today for evaluation of fecal smearing.   Fecal smearing, proctalgia: Etiology of proctalgia unclear as he has only had occurrence of intense pain 3 times in the last year.  Fiber in diet with eating fruit much more vegetabls.  Has been having some fecal smearing with activity.  At times may have gas he may also have a small smear.  I  PLAN   *** Start taking Benefiber or Metamucil 2-3 teaspoons daily. Continue  increasing fiber in your diet. If any return of rectal bleeding or increased frequency of pain please contact the office. You may use over-the-counter hemorrhoid cream as needed. Follow-up in 8 weeks, sooner if needed   Venetia Night, MSN, FNP-BC, AGACNP-BC [facility]

## 2022-05-23 ENCOUNTER — Other Ambulatory Visit: Payer: Self-pay | Admitting: Internal Medicine

## 2022-05-23 ENCOUNTER — Ambulatory Visit: Payer: Managed Care, Other (non HMO) | Admitting: Gastroenterology

## 2022-05-23 ENCOUNTER — Encounter: Payer: Self-pay | Admitting: Gastroenterology

## 2022-05-23 ENCOUNTER — Ambulatory Visit (INDEPENDENT_AMBULATORY_CARE_PROVIDER_SITE_OTHER): Payer: Managed Care, Other (non HMO) | Admitting: Gastroenterology

## 2022-05-23 ENCOUNTER — Other Ambulatory Visit: Payer: Self-pay

## 2022-05-23 VITALS — BP 123/81 | HR 73 | Temp 98.4°F | Ht 68.0 in | Wt 240.0 lb

## 2022-05-23 DIAGNOSIS — K625 Hemorrhage of anus and rectum: Secondary | ICD-10-CM

## 2022-05-23 DIAGNOSIS — R151 Fecal smearing: Secondary | ICD-10-CM

## 2022-05-23 DIAGNOSIS — K6289 Other specified diseases of anus and rectum: Secondary | ICD-10-CM | POA: Diagnosis not present

## 2022-05-23 DIAGNOSIS — I10 Essential (primary) hypertension: Secondary | ICD-10-CM

## 2022-05-23 MED ORDER — HYDROCHLOROTHIAZIDE 25 MG PO TABS
25.0000 mg | ORAL_TABLET | Freq: Every day | ORAL | 1 refills | Status: DC
  Filled 2022-05-23: qty 30, 30d supply, fill #0
  Filled 2022-07-09: qty 30, 30d supply, fill #1
  Filled 2022-08-07: qty 30, 30d supply, fill #2
  Filled 2022-09-10: qty 30, 30d supply, fill #3
  Filled 2022-10-10: qty 30, 30d supply, fill #4
  Filled 2022-11-05 – 2022-11-12 (×2): qty 30, 30d supply, fill #5

## 2022-05-23 NOTE — Patient Instructions (Signed)
Start taking Benefiber or Metamucil 2-3 teaspoons daily. Continue increasing fiber in your diet.  It may take a couple weeks before you see a big difference. If any return of rectal bleeding or increased frequency of pain please contact the office. You may use over-the-counter hemorrhoid cream as needed.  We will see you in 8 weeks, sooner if needed.  Please not hesitate to call if you have any worsening symptoms.  It can take a few weeks before you see a difference with the increasing fiber in regards to your smearing.  It was a pleasure to see you today. I want to create trusting relationships with patients. If you receive a survey regarding your visit,  I greatly appreciate you taking time to fill this out on paper or through your MyChart. I value your feedback.  Venetia Night, MSN, FNP-BC, AGACNP-BC [facility]

## 2022-05-24 ENCOUNTER — Other Ambulatory Visit: Payer: Self-pay

## 2022-05-24 MED ORDER — TADALAFIL 20 MG PO TABS
20.0000 mg | ORAL_TABLET | Freq: Every day | ORAL | 3 refills | Status: DC | PRN
  Filled 2022-05-24: qty 10, 30d supply, fill #0

## 2022-05-24 NOTE — Telephone Encounter (Signed)
Requested medication (s) are due for refill today:   No  Requested medication (s) are on the active medication list:   No  Future visit scheduled:   No    Seen 2 wks ago by Dr. [name]   Last ordered: Discontinued 5 yrs ago 09/11/2016 by a historical provider the 20 mg dose.     On med. List there is a 10 mg listed but he is not taking it as of 05/23/2022 and it's listed as historical also.      Requested Prescriptions  Pending Prescriptions Disp Refills   tadalafil (CIALIS) 20 MG tablet 30 tablet 3    Sig: TAKE 1 TABLET BY MOUTH ONCE DAILY AS NEEDED FOR ERECTILE DYSFUNTION     Urology: Erectile Dysfunction Agents Passed - 05/23/2022  4:59 PM      Passed - AST in normal range and within 360 days    AST  Date Value Ref Range Status  09/14/2021 16 0 - 40 IU/L Final         Passed - ALT in normal range and within 360 days    ALT  Date Value Ref Range Status  09/14/2021 23 0 - 44 IU/L Final         Passed - Last BP in normal range    BP Readings from Last 1 Encounters:  05/23/22 123/81         Passed - Valid encounter within last 12 months    Recent Outpatient Visits           2 weeks ago Essential hypertension   [place], MD   4 months ago Essential hypertension   [place], MD   7 months ago OSA treated with [place], MD   8 months ago Essential hypertension   [place], MD   10 months ago Bilateral neck pain   [place], MD       Future Appointments             In 2 months Gasper Sells, Terisa Starr, MD [place] [facility], [place]

## 2022-05-24 NOTE — Progress Notes (Signed)
GI Office Note    Referring Provider: Ladell Pier, MD Primary Care Physician:  Ladell Pier, MD  Primary Gastroenterologist: Elon Alas. Abbey Chatters, DO, previously Dr. Oneida Alar  Chief Complaint   Chief Complaint  Patient presents with   fecal smearing    Patient here today due having issues with fecal smearing.Patient says he eats a lot of fruits and is regular. He says he has had some issues with extreme rectal pain several times.    History of Present Illness   Samuel Irwin is a 62 y.o. male presenting today at the request of Ladell Pier, MD for fecal smearing.  EGD 10/18/22: -Moderate worsening gastritis s/p biopsy -Normal duodenum -Advised omeprazole once daily.  Colonoscopy 10/18/14: -moderate internal and external hemorrhoids -redundant left colon -Denies use Preparation H 2-4 times daily for rectal pressure/bleeding and itching.  Also advised high-fiber diet. -repeat in 10 years  Last office visit 11/20/16.  Never tried MiraLAX as previously recommended.  Using castor oil once a week.  Denies melena or rectal bleeding.  No abdominal pain.  Heartburn controlled on omeprazole as needed.  Exercising regularly.  Has lost 20 pounds.  Advised to use MiraLAX at bedtime basis does not have an adequate bowel movement.  Advised to avoid frequent use of Castor oil.   Today: Eats lots of fruits and vegetables. Eats more fruits than vegetables (bananas, apples, grapes). Tries to do this to help keep him regular. Is having leekage or seepage in his underwear. He has to wipe multiple times in a row after using the bathroom. When bending over to pick up things or do sit ups at the gym he is having streaks in his underwear.   Reports he is gaining weight right now. Used to being in the gym and swimming but now he is not very active.   Has had rectal pain described as 8/10 and will last for a while. Last instance was about 1 month or a little longer ago. It occurred in the  gym. States this also happened in the gym as well. Was just getting dressed or undressed to take a shower. Has had some mild rectal bleeding. Pain usually only lasts for a few minutes with the last one that lasted about 8 minutes. Pain has had about 3 times in the last year.   Bleeding not present with every bowel movement.  Usually only on toilet tissue.   Current Outpatient Medications  Medication Sig Dispense Refill   acyclovir (ZOVIRAX) 400 MG tablet Take 1 tablet (400 mg total) by mouth 2 (two) times daily. 180 tablet 1   atorvastatin (LIPITOR) 20 MG tablet Take 1 tablet (20 mg total) by mouth daily. 90 tablet 1   azelastine (ASTELIN) 0.1 % nasal spray PLACE 2 SPRAYS INTO BOTH NOSTRILS 2 (TWO) TIMES DAILY. USE IN EACH NOSTRIL AS DIRECTED 30 mL 0   diclofenac Sodium (VOLTAREN) 1 % GEL Apply 2 g topically 3 (three) times daily. 100 g 0   fluticasone (FLONASE) 50 MCG/ACT nasal spray PLACE 2 SPRAYS INTO BOTH NOSTRILS DAILY. 16 g 2   hydrochlorothiazide (HYDRODIURIL) 25 MG tablet Take 1 tablet (25 mg total) by mouth daily. 90 tablet 1   tamsulosin (FLOMAX) 0.4 MG CAPS capsule Take 1 capsule (0.4 mg total) by mouth daily. 90 capsule 1   tadalafil (CIALIS) 10 MG tablet Take 10 mg by mouth daily as needed for erectile dysfunction. (Patient not taking: Reported on 05/23/2022)     No  current facility-administered medications for this visit.    Past Medical History:  Diagnosis Date   Constipation    Cough    Generalized headaches    GERD (gastroesophageal reflux disease)    H/O hiatal hernia    Heart murmur    Herpes    Hypertension    Sleep apnea    uses BIPAP   Umbilical hernia    Weakness    Wheezing     Past Surgical History:  Procedure Laterality Date   BIOPSY  10/18/2014   Procedure: BIOPSY (Gastric);  Surgeon: Danie Binder, MD;  Location: [facility];  Service: Endoscopy;;   CARPAL TUNNEL RELEASE     R hand   COLONOSCOPY WITH PROPOFOL N/A 10/18/2014   [id number] size external  hemorrhoids/left colon is redundant   ESOPHAGOGASTRODUODENOSCOPY (EGD) WITH PROPOFOL N/A 10/18/2014   SLF: epigastric pain due to moderate NSAID gastritis   FEMUR SURGERY     left   HERNIA REPAIR  04/07/12   umb hernia repair   INSERTION OF MESH N/A 04/07/2012   Procedure: INSERTION OF MESH;  Surgeon: Madilyn Hook, DO;  Location: [facility];  Service: General;  Laterality: N/A;   KNEE ARTHROSCOPY     right   MINOR CARPAL TUNNEL Left 01/31/2016   Procedure: MINOR CARPAL TUNNEL RELEASE LEFT;  Surgeon: Charlotte Crumb, MD;  Location: [facility];  Service: Orthopedics;  Laterality: Left;  Local   TOE SURGERY     dislocated lt foot   UMBILICAL HERNIA REPAIR N/A 04/07/2012   Procedure: HERNIA REPAIR UMBILICAL ADULT;  Surgeon: Madilyn Hook, DO;  Location: [facility];  Service: General;  Laterality: N/A;  open umbilical hernia repair with mesh    Family History  Problem Relation Age of Onset   Hypertension Mother    Cancer Father        lung   Colon cancer Neg Hx     Allergies as of 05/23/2022   (No Known Allergies)    Social History   Socioeconomic History   Marital status: Single    Spouse name: Not on file   Number of children: Not on file   Years of education: Not on file   Highest education level: Not on file  Occupational History   Occupation: unemployed  Tobacco Use   Smoking status: Some Days    Packs/day: 0.25    Years: 43.00    Additional pack years: 0.00    Total pack years: 10.75    Types: Cigarettes   Smokeless tobacco: Never   Tobacco comments:    occas still smokes when he drinks  Vaping Use   Vaping Use: Never used  Substance and Sexual Activity   Alcohol use: Yes    Comment: once weekly   Drug use: Not Currently    Types: Cocaine, "Crack" cocaine    Comment: smoked crack   Sexual activity: Not Currently    Comment: "when I drink"  Other Topics Concern   Not on file  Social History Narrative   Not on file   Social Determinants of Health    Financial Resource Strain: Not on file  Food Insecurity: Not on file  Transportation Needs: Not on file  Physical Activity: Not on file  Stress: Not on file  Social Connections: Not on file  Intimate Partner Violence: Not on file     Review of Systems   Gen: Denies any fever, chills, fatigue, weight loss, lack of appetite.  CV: Denies chest pain,  heart palpitations, peripheral edema, syncope.  Resp: Denies shortness of breath at rest or with exertion. Denies wheezing or cough.  GI: see HPI GU : Denies urinary burning, urinary frequency, urinary hesitancy MS: Denies joint pain, muscle weakness, cramps, or limitation of movement.  Derm: Denies rash, itching, dry skin Psych: Denies depression, anxiety, memory loss, and confusion Heme: Denies bruising, bleeding, and enlarged lymph nodes.   Physical Exam   BP 123/81 (BP Location: Left Arm, Patient Position: Sitting, Cuff Size: Large)   Pulse 73   Temp 98.4 F (36.9 C) (Temporal)   Ht 5\' 8"  (1.727 m)   Wt 240 lb (108.9 kg)   BMI 36.49 kg/m   General:   Alert and oriented. Pleasant and cooperative. Well-nourished and well-developed.  Head:  Normocephalic and atraumatic. Eyes:  Without icterus, sclera clear and conjunctiva pink.  Ears:  Normal auditory acuity. Mouth:  No deformity or lesions, oral mucosa pink.  Lungs:  Clear to auscultation bilaterally. No wheezes, rales, or rhonchi. No distress.  Heart:  S1, S2 present without murmurs appreciated.  Abdomen:  +BS, soft, non-tender and non-distended. No HSM noted. No guarding or rebound. No masses appreciated.  Rectal:  no external hemorrhoids. No rectal mass. Good rectal tone. Evidence of small hemorrhoids felt on DRE.  Msk:  Symmetrical without gross deformities. Normal posture. Extremities:  Without edema. Neurologic:  Alert and  oriented x4;  grossly normal neurologically. Skin:  Intact without significant lesions or rashes. Psych:  Alert and cooperative. Normal mood and  affect.   Assessment    Samuel Irwin is a 62 y.o. male with a history of constipation, HTN, GERD presenting today for evaluation of fecal smearing.   Fecal smearing, proctalgia: Etiology of proctalgia unclear as he has only had occurrence of intense pain 3 times in the last year.  Fiber in diet with eating fruit much more vegetabls.  Has been having some fecal smearing with activity.  At times may have gas he may also have a small smear.  Symptoms also include incomplete emptying although states he has a daily bowel movement.  Has intermittent tissue hematochezia likely related to hemorrhoids.  He does have evidence of hemorrhoids on colonoscopy in August 2016.  Evidence of small internal hemorrhoids today on rectal exam without any significant mucosal prolapse today.  Has external good rectal tone.  Suspect symptoms are primarily related to hemorrhoids and mild constipation/incomplete emptying.  We discussed that although he eats a fair amount of fruits that this likely is not enough fiber supplement for him therefore I recommended for him to start taking Benefiber or Metamucil to aid in bulking his stools to see if this improves his bowel movements and reduce episodes of fecal smearing.  If he has recurrent or more frequent rectal pain as well as ongoing fecal smearing he may benefit from rectal manometry.  PLAN   Start taking Benefiber or Metamucil 2-3 teaspoons daily. Continue increasing fiber in your diet. If any return of rectal bleeding or increased frequency of pain please contact the office. You may use over-the-counter hemorrhoid cream as needed. May need further evaluation with rectal manometry to assess rectal compliance.  Follow-up in 8 weeks, sooner if needed   Venetia Night, MSN, FNP-BC, AGACNP-BC [facility]

## 2022-06-11 ENCOUNTER — Encounter: Payer: Self-pay | Admitting: Internal Medicine

## 2022-06-14 ENCOUNTER — Ambulatory Visit
Admission: RE | Admit: 2022-06-14 | Discharge: 2022-06-14 | Disposition: A | Discharge status: Home / Self Care | Payer: Managed Care, Other (non HMO) | Source: Ambulatory Visit | Attending: Internal Medicine | Admitting: Internal Medicine

## 2022-06-14 DIAGNOSIS — G4489 Other headache syndrome: Secondary | ICD-10-CM

## 2022-07-10 ENCOUNTER — Other Ambulatory Visit: Payer: Self-pay

## 2022-07-19 ENCOUNTER — Ambulatory Visit (HOSPITAL_COMMUNITY)
Admission: RE | Admit: 2022-07-19 | Discharge: 2022-07-19 | Disposition: A | Discharge status: Home / Self Care | Payer: Managed Care, Other (non HMO) | Source: Ambulatory Visit | Attending: Internal Medicine | Admitting: Internal Medicine

## 2022-07-19 DIAGNOSIS — E785 Hyperlipidemia, unspecified: Secondary | ICD-10-CM | POA: Insufficient documentation

## 2022-07-23 ENCOUNTER — Ambulatory Visit: Payer: Managed Care, Other (non HMO) | Admitting: Gastroenterology

## 2022-08-07 ENCOUNTER — Other Ambulatory Visit: Payer: Self-pay

## 2022-08-08 ENCOUNTER — Ambulatory Visit: Payer: Managed Care, Other (non HMO) | Attending: Internal Medicine | Admitting: Internal Medicine

## 2022-08-08 ENCOUNTER — Encounter: Payer: Self-pay | Admitting: Internal Medicine

## 2022-08-08 VITALS — BP 118/70 | HR 76 | Ht 68.0 in | Wt 243.0 lb

## 2022-08-08 DIAGNOSIS — Z720 Tobacco use: Secondary | ICD-10-CM | POA: Diagnosis not present

## 2022-08-08 DIAGNOSIS — I10 Essential (primary) hypertension: Secondary | ICD-10-CM | POA: Diagnosis not present

## 2022-08-08 DIAGNOSIS — E669 Obesity, unspecified: Secondary | ICD-10-CM | POA: Diagnosis not present

## 2022-08-08 DIAGNOSIS — E782 Mixed hyperlipidemia: Secondary | ICD-10-CM

## 2022-08-08 DIAGNOSIS — G4733 Obstructive sleep apnea (adult) (pediatric): Secondary | ICD-10-CM
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
## 2022-08-08 NOTE — Patient Instructions (Addendum)
Medication Instructions:  Your physician recommends that you continue on your current medications as directed. Please refer to the Current Medication list given to you today.  *If you need a refill on your cardiac medications before your next appointment, please call your pharmacy*   Lab Work: None If you have labs (blood work) drawn today and your tests are completely normal, you will receive your results only by: MyChart Message (if you have MyChart) OR A paper copy in the mail If you have any lab test that is abnormal or we need to change your treatment, we will call you to review the results.   Testing/Procedures: None   Follow-Up: At [facility], you and your health needs are our priority.  As part of our continuing mission to provide you with exceptional heart care, we have created designated Provider Care Teams.  These Care Teams include your primary Cardiologist (physician) and Advanced Practice Providers (APPs -  Physician Assistants and Nurse Practitioners) who all work together to provide you with the care you need, when you need it.  Your next appointment:   December 2024  Provider:   Riley Lam, MD

## 2022-08-14 ENCOUNTER — Ambulatory Visit: Payer: Managed Care, Other (non HMO) | Admitting: Internal Medicine

## 2022-09-05 ENCOUNTER — Encounter: Payer: Self-pay | Admitting: Gastroenterology

## 2022-09-05 ENCOUNTER — Ambulatory Visit: Payer: Managed Care, Other (non HMO) | Admitting: Gastroenterology

## 2022-09-05 VITALS — BP 128/80 | HR 71 | Temp 98.3°F | Ht 68.0 in | Wt 243.2 lb

## 2022-09-05 DIAGNOSIS — R151 Fecal smearing: Secondary | ICD-10-CM

## 2022-09-05 DIAGNOSIS — K594 Anal spasm: Secondary | ICD-10-CM

## 2022-09-05 NOTE — Progress Notes (Signed)
GI Office Note    Referring Provider: Marcine Matar, MD Primary Care Physician:  Marcine Matar, MD Primary Gastroenterologist: Hennie Duos. Marletta Lor, DO  Date:  09/05/2022  ID:  [name], DOB January 23, 1961, MRN [id number]   Chief Complaint   Chief Complaint  Patient presents with   Follow-up    Not having any bleeding still having some pain.   History of Present Illness  Samuel Irwin is a 62 y.o. male with a history of constipation, rectal pain,GERD, and HTN presenting today for follow up.   EGD 10/18/14: -Moderate worsening gastritis s/p biopsy -Normal duodenum -Advised omeprazole once daily.   Colonoscopy 10/18/14: -moderate internal and external hemorrhoids -redundant left colon -Denies use Preparation H 2-4 times daily for rectal pressure/bleeding and itching.  Also advised high-fiber diet. -repeat in 10 years  Initial office visit 05/23/22.  Reported he eats a fair amount of vegetables and fruits and transitions to keep Rehman, but reported leakage and seepage in his underwear with wiping times.  States he has streaks in his underwear when he attempted to set up for bends over at the gym.  Currently not being very active given concern over bowel movements and rectal pain.  Continues to have pain that is described as 8 out of 10 and last for a while.  Happened about a month prior to office visit and it occurred in the gym.  Also with some mild rectal bleeding.  Pain usually occurs for a few minutes at a time.  Has happened 3 times in the last 12 months.  Rectal exam completed without any external hemorrhoids.  No rectal masses good rectal tone identified.  Possible evidence of small hemorrhoids on DRE.  Vies to start Benefiber or Metamucil 2 to 3 teaspoons daily continue high-fiber diet.  Consider further evaluation with rectal manometry to assess rectal compliance.  Today: Had an episode of rectal pain that lasted for 4 minutes. Was sitting doing nothing during  that time. He stood up when that occurred then goes away like nothing happened. Has not had any bleeding.   Taking vitamins and BP medication. Has been having headaches. Unsure if turmeric is causing headache or if it was recent consumption of pork. BP has been high recently.   Went to cardiology last month and was told he looked good and could possibly come off his.   Still having fecal smearing everyday. Taking generic metamucil. Tries to take it daily, sometimes twice daily.    Current Outpatient Medications  Medication Sig Dispense Refill   acyclovir (ZOVIRAX) 400 MG tablet Take 1 tablet (400 mg total) by mouth 2 (two) times daily. 180 tablet 1   atorvastatin (LIPITOR) 20 MG tablet Take 1 tablet (20 mg total) by mouth daily. 90 tablet 1   azelastine (ASTELIN) 0.1 % nasal spray PLACE 2 SPRAYS INTO BOTH NOSTRILS 2 (TWO) TIMES DAILY. USE IN EACH NOSTRIL AS DIRECTED (Patient taking differently: as needed for allergies.) 30 mL 0   fluticasone (FLONASE) 50 MCG/ACT nasal spray PLACE 2 SPRAYS INTO BOTH NOSTRILS DAILY. (Patient taking differently: Place 2 sprays into both nostrils as needed for allergies.) 16 g 2   hydrochlorothiazide (HYDRODIURIL) 25 MG tablet Take 1 tablet (25 mg total) by mouth daily. 90 tablet 1   omeprazole (PRILOSEC) 20 MG capsule Take by mouth as needed (heartburn).     tadalafil (CIALIS) 10 MG tablet Take 10 mg by mouth daily as needed for erectile dysfunction.     tadalafil (  CIALIS) 20 MG tablet Take 20 mg by mouth daily as needed for erectile dysfunction.     tamsulosin (FLOMAX) 0.4 MG CAPS capsule Take 1 capsule (0.4 mg total) by mouth daily. 90 capsule 1   No current facility-administered medications for this visit.    Past Medical History:  Diagnosis Date   Constipation    Cough    Generalized headaches    GERD (gastroesophageal reflux disease)    H/O hiatal hernia    Heart murmur    Herpes    Hypertension    Sleep apnea    uses BIPAP   Umbilical hernia     Weakness    Wheezing     Past Surgical History:  Procedure Laterality Date   BIOPSY  10/18/2014   Procedure: BIOPSY (Gastric);  Surgeon: West Bali, MD;  Location: [facility];  Service: Endoscopy;;   CARPAL TUNNEL RELEASE     R hand   COLONOSCOPY WITH PROPOFOL N/A 10/18/2014   [id number] size external hemorrhoids/left colon is redundant   ESOPHAGOGASTRODUODENOSCOPY (EGD) WITH PROPOFOL N/A 10/18/2014   SLF: epigastric pain due to moderate NSAID gastritis   FEMUR SURGERY     left   HERNIA REPAIR  04/07/12   umb hernia repair   INSERTION OF MESH N/A 04/07/2012   Procedure: INSERTION OF MESH;  Surgeon: Lodema Pilot, DO;  Location: [facility];  Service: General;  Laterality: N/A;   KNEE ARTHROSCOPY     right   MINOR CARPAL TUNNEL Left 01/31/2016   Procedure: MINOR CARPAL TUNNEL RELEASE LEFT;  Surgeon: Dairl Ponder, MD;  Location:  SUR[facility]ERY [facility]ENTER;  Service: Orthopedics;  Laterality: Left;  Local   TOE SURGERY     dislocated lt foot   UMBILICAL HERNIA REPAIR N/A 04/07/2012   Procedure: HERNIA REPAIR UMBILICAL ADULT;  Surgeon: Lodema Pilot, DO;  Location: [facility];  Service: General;  Laterality: N/A;  open umbilical hernia repair with mesh    Family History  Problem Relation Age of Onset   Hypertension Mother    Cancer Father        lung   Colon cancer Neg Hx     Allergies as of 09/05/2022   (No Known Allergies)    Social History   Socioeconomic History   Marital status: Single    Spouse name: Not on file   Number of children: Not on file   Years of education: Not on file   Highest education level: Not on file  Occupational History   Occupation: unemployed  Tobacco Use   Smoking status: Some Days    Current packs/day: 0.25    Average packs/day: 0.3 packs/day for 43.0 years (10.8 ttl pk-yrs)    Types: Cigarettes   Smokeless tobacco: Never   Tobacco comments:    occas still smokes when he drinks  Vaping Use   Vaping status: Never Used  Substance and Sexual  Activity   Alcohol use: Yes    Comment: once weekly   Drug use: Not Currently    Types: Cocaine, "Crack" cocaine    Comment: smoked crack   Sexual activity: Not Currently    Comment: "when I drink"  Other Topics Concern   Not on file  Social History Narrative   Not on file   Social Determinants of Health   Financial Resource Strain: Not on file  Food Insecurity: Not on file  Transportation Needs: Not on file  Physical Activity: Not on file  Stress: Not on file  Social Connections:  Not on file     Review of Systems   Gen: Denies fever, chills, anorexia. Denies fatigue, weakness, weight loss.  CV: Denies chest pain, palpitations, syncope, peripheral edema, and claudication. Resp: Denies dyspnea at rest, cough, wheezing, coughing up blood, and pleurisy. GI: See HPI Derm: Denies rash, itching, dry skin Psych: Denies depression, anxiety, memory loss, confusion. No homicidal or suicidal ideation.  Heme: Denies bruising, bleeding, and enlarged lymph nodes.   Physical Exam   BP 128/80 (BP Location: Right Arm, Patient Position: Sitting, Cuff Size: Large)   Pulse 71   Temp 98.3 F (36.8 C) (Oral)   Ht 5\' 8"  (1.727 m)   Wt 243 lb 3.2 oz (110.3 kg)   SpO2 96%   BMI 36.98 kg/m   General:   Alert and oriented. No distress noted. Pleasant and cooperative.  Head:  Normocephalic and atraumatic. Eyes:  Conjuctiva clear without scleral icterus. Mouth:  Oral mucosa pink and moist. Good dentition. No lesions. Lungs:  Clear to auscultation bilaterally. No wheezes, rales, or rhonchi. No distress.  Heart:  S1, S2 present without murmurs appreciated.  Abdomen:  +BS, soft, non-tender and non-distended. No rebound or guarding. No HSM or masses noted. Rectal: deferred Msk:  Symmetrical without gross deformities. Normal posture. Extremities:  Without edema. Neurologic:  Alert and  oriented x4 Psych:  Alert and cooperative. Normal mood and affect.   Assessment  Samuel Irwin is a  62 y.o. male with a history of constipation, rectal pain, GERD, COPD, and HTN presenting today for follow up.   Proctalgia, fecal smearing: Denies any rectal bleeding. Symptoms last several minutes when they occur and are intense but able to provide minimal relief with standing when this occurs. Symptoms continue to be seldom in nature and has been taking generic metamucil once daily, sometimes twice daily. With flatulence he continues to have some mild fecal smearing present that occurs with need for more frequent wiping for hygiene. No recent rectal bleeding. Discussed other methods for evaluation of pain including colonoscopy vs rectal manometry. Also discussed potential treatment options including topical dilt vs hemorrhoid banding. He would like early interval colonoscopy given his pain. No evidence of mass on prior rectal exam. Advised to increase fiber intake. May potentially benefit from hemorrhoid banding pending colonoscopy results. Discussed pathology behind hemorrhoids and how this causes smearing.   PLAN   Continue fiber supplementation twice daily.  If rectal pain becomes more frequent we can consider topical diltiazem Proceed with colonoscopy with propofol by Dr. Marletta Lor  in near future: the risks, benefits, and alternatives have been discussed with the patient in detail. The patient states understanding and desires to proceed. ASA 2 Consider hemorrhoid banding - pamphlet provided If pain become more frequent and TCS negative then need to purse rectal manometry.  Follow-up 12-16 weeks.     Brooke Bonito, MSN, FNP-BC, AGACNP-BC [facility]

## 2022-09-05 NOTE — Patient Instructions (Addendum)
For scheduling for colonoscopy in the near future with Dr. Marletta Lor.  Take your Metamucil twice daily if able.  Make sure you mix this in 8 ounces of water.   Please look over the hemorrhoid banding pamphlet that I gave you.  Treatment of hemorrhoids can sometimes benefit and resolve the issue with the fecal smearing (messing up underwear after movement).  I will see you for follow-up after your colonoscopy.  It was a pleasure to see you today. I want to create trusting relationships with patients. If you receive a survey regarding your visit,  I greatly appreciate you taking time to fill this out on paper or through your MyChart. I value your feedback.  Brooke Bonito, MSN, FNP-BC, AGACNP-BC [facility]

## 2022-09-10 ENCOUNTER — Ambulatory Visit (HOSPITAL_COMMUNITY)
Admission: EM | Admit: 2022-09-10 | Discharge: 2022-09-10 | Disposition: A | Discharge status: Home / Self Care | Payer: Managed Care, Other (non HMO) | Attending: Family Medicine | Admitting: Family Medicine

## 2022-09-10 ENCOUNTER — Encounter (HOSPITAL_COMMUNITY): Payer: Self-pay

## 2022-09-10 ENCOUNTER — Other Ambulatory Visit: Payer: Self-pay

## 2022-09-10 ENCOUNTER — Other Ambulatory Visit: Payer: Self-pay | Admitting: Internal Medicine

## 2022-09-10 DIAGNOSIS — B9789 Other viral agents as the cause of diseases classified elsewhere: Secondary | ICD-10-CM | POA: Insufficient documentation

## 2022-09-10 DIAGNOSIS — R059 Cough, unspecified: Secondary | ICD-10-CM | POA: Insufficient documentation

## 2022-09-10 DIAGNOSIS — N400 Enlarged prostate without lower urinary tract symptoms: Secondary | ICD-10-CM

## 2022-09-10 DIAGNOSIS — J069 Acute upper respiratory infection, unspecified: Secondary | ICD-10-CM | POA: Insufficient documentation

## 2022-09-10 DIAGNOSIS — G4733 Obstructive sleep apnea (adult) (pediatric): Secondary | ICD-10-CM | POA: Diagnosis not present

## 2022-09-10 DIAGNOSIS — I10 Essential (primary) hypertension: Secondary | ICD-10-CM | POA: Diagnosis not present

## 2022-09-10 DIAGNOSIS — Z1152 Encounter for screening for COVID-19: Secondary | ICD-10-CM | POA: Diagnosis not present

## 2022-09-10 MED ORDER — TAMSULOSIN HCL 0.4 MG PO CAPS
0.4000 mg | ORAL_CAPSULE | Freq: Every day | ORAL | 0 refills | Status: DC
Start: 2022-09-10 — End: 2022-10-11
  Filled 2022-09-10: qty 30, 30d supply, fill #0

## 2022-09-10 MED ORDER — ACYCLOVIR 400 MG PO TABS
400.0000 mg | ORAL_TABLET | Freq: Two times a day (BID) | ORAL | 0 refills | Status: DC
  Filled 2022-09-10: qty 60, 30d supply, fill #0

## 2022-09-10 MED ORDER — PROMETHAZINE-DM 6.25-15 MG/5ML PO SYRP
5.0000 mL | ORAL_SOLUTION | Freq: Four times a day (QID) | ORAL | 0 refills | Status: DC | PRN
  Filled 2022-09-10: qty 118, 6d supply, fill #0

## 2022-09-10 MED ORDER — ATORVASTATIN CALCIUM 20 MG PO TABS
20.0000 mg | ORAL_TABLET | Freq: Every day | ORAL | 0 refills | Status: DC
  Filled 2022-09-10: qty 30, 30d supply, fill #0

## 2022-09-10 NOTE — ED Triage Notes (Signed)
Patient here today with c/o runny nose, sneeze, cough, SOB, wheeze, and stuffy nose. He tried taking Theraflu with no relief. No sick contacts. No recent travel.

## 2022-09-10 NOTE — Discharge Instructions (Signed)
Take Phenergan with dextromethorphan syrup--5 mL or 1 teaspoon every 6 hours as needed for cough   You have been swabbed for COVID, and the test will result in the next 24 hours. Our staff will call you if positive. If the COVID test is positive, you should quarantine until you are fever free for 24 hours and you are starting to feel better, and then take added precautions for the next 5 days, such as physical distancing/wearing a mask and good hand hygiene/washing.

## 2022-09-10 NOTE — ED Provider Notes (Signed)
[facility]    CSN: [id number] Arrival date & time: 09/10/22  0957      History   Chief Complaint Chief Complaint  Patient presents with   Cough    HPI Samuel Irwin is a 62 y.o. male.    Cough Here for cough, rhinorrhea, and nasal congestion.  Symptoms began on July 13.  He had a scratchy throat.  He has maybe had some sweats but no documented fever and no chills.  No nausea vomiting or diarrhea.  Clinical staff understood him to say he had been wheezing, but when I ask him that he states he has not been wheezing and he does not have a history of asthma.  He states maybe he has been a little short of breath.  He does use a CPAP machine for OSA.  He does have a history of hypertension.    Past Medical History:  Diagnosis Date   Constipation    Cough    Generalized headaches    GERD (gastroesophageal reflux disease)    H/O hiatal hernia    Heart murmur    Herpes    Hypertension    Sleep apnea    uses BIPAP   Umbilical hernia    Weakness    Wheezing     Patient Active Problem List   Diagnosis Date Noted   Right lower quadrant abdominal pain 09/29/2020   Primary osteoarthritis, right shoulder 09/10/2020   Chronic pain of both shoulders 12/07/2019   Prediabetes 09/21/2019   Paranoid state (HCC) 09/20/2019   Tobacco use 02/10/2019   Chronic cough 12/29/2018   Other headache syndrome 02/12/2018   Arthritis pain of hand 02/12/2018   Scrotal mass 08/04/2017   Obesity (BMI 35.0-39.9 without comorbidity) 03/10/2017   Burning sensation of feet 09/23/2016   Floaters in visual field, right 09/23/2016   Osteoarthritis of right knee 02/28/2016   Chronic bilateral low back pain 02/28/2016   Poor dentition 01/29/2016   Essential hypertension 06/06/2015   BPH (benign prostatic hyperplasia) 05/04/2015   Other seasonal allergic rhinitis 05/04/2015   Target of perceived adverse discrimination or persecution 04/05/2015   Hemorrhoids 02/23/2015    Constipation 02/23/2015   GERD (gastroesophageal reflux disease) 09/21/2014   Carpal tunnel syndrome 08/12/2014   Erectile dysfunction 08/20/2013   Hyperlipidemia 08/20/2013   OSA (obstructive sleep apnea) 08/24/2012    Past Surgical History:  Procedure Laterality Date   BIOPSY  10/18/2014   Procedure: BIOPSY (Gastric);  Surgeon: West Bali, MD;  Location: [facility];  Service: Endoscopy;;   CARPAL TUNNEL RELEASE     R hand   COLONOSCOPY WITH PROPOFOL N/A 10/18/2014   [id number] size external hemorrhoids/left colon is redundant   ESOPHAGOGASTRODUODENOSCOPY (EGD) WITH PROPOFOL N/A 10/18/2014   SLF: epigastric pain due to moderate NSAID gastritis   FEMUR SURGERY     left   HERNIA REPAIR  04/07/12   umb hernia repair   INSERTION OF MESH N/A 04/07/2012   Procedure: INSERTION OF MESH;  Surgeon: Lodema Pilot, DO;  Location: [facility];  Service: General;  Laterality: N/A;   KNEE ARTHROSCOPY     right   MINOR CARPAL TUNNEL Left 01/31/2016   Procedure: MINOR CARPAL TUNNEL RELEASE LEFT;  Surgeon: Dairl Ponder, MD;  Location: [facility];  Service: Orthopedics;  Laterality: Left;  Local   TOE SURGERY     dislocated lt foot   UMBILICAL HERNIA REPAIR N/A 04/07/2012   Procedure: HERNIA REPAIR UMBILICAL ADULT;  Surgeon:  Lodema Pilot, DO;  Location: [facility];  Service: General;  Laterality: N/A;  open umbilical hernia repair with mesh       Home Medications    Prior to Admission medications   Medication Sig Start Date End Date Taking? Authorizing Provider  acyclovir (ZOVIRAX) 400 MG tablet Take 1 tablet (400 mg total) by mouth 2 (two) times daily. 02/26/22  Yes Marcine Matar, MD  atorvastatin (LIPITOR) 20 MG tablet Take 1 tablet (20 mg total) by mouth daily. 02/26/22  Yes Marcine Matar, MD  azelastine (ASTELIN) 0.1 % nasal spray PLACE 2 SPRAYS INTO BOTH NOSTRILS 2 (TWO) TIMES DAILY. USE IN EACH NOSTRIL AS DIRECTED Patient taking differently: as needed for allergies. 10/31/21   Yes Marcine Matar, MD  fluticasone (FLONASE) 50 MCG/ACT nasal spray PLACE 2 SPRAYS INTO BOTH NOSTRILS DAILY. Patient taking differently: Place 2 sprays into both nostrils as needed for allergies. 01/26/20  Yes Marcine Matar, MD  hydrochlorothiazide (HYDRODIURIL) 25 MG tablet Take 1 tablet (25 mg total) by mouth daily. 05/23/22  Yes Marcine Matar, MD  promethazine-dextromethorphan (PROMETHAZINE-DM) 6.25-15 MG/5ML syrup Take 5 mLs by mouth 4 (four) times daily as needed for cough. 09/10/22  Yes Zenia Resides, MD  tamsulosin (FLOMAX) 0.4 MG CAPS capsule Take 1 capsule (0.4 mg total) by mouth daily. 02/26/22  Yes Marcine Matar, MD  omeprazole (PRILOSEC) 20 MG capsule Take by mouth as needed (heartburn). 11/23/15   [provider]  tadalafil (CIALIS) 10 MG tablet Take 10 mg by mouth daily as needed for erectile dysfunction.    [provider]  tadalafil (CIALIS) 20 MG tablet Take 20 mg by mouth daily as needed for erectile dysfunction.    [provider]    Family History Family History  Problem Relation Age of Onset   Hypertension Mother    Cancer Father        lung   Colon cancer Neg Hx     Social History Social History   Tobacco Use   Smoking status: Some Days    Current packs/day: 0.25    Average packs/day: 0.3 packs/day for 43.0 years (10.8 ttl pk-yrs)    Types: Cigarettes   Smokeless tobacco: Never   Tobacco comments:    occas still smokes when he drinks  Vaping Use   Vaping status: Never Used  Substance Use Topics   Alcohol use: Yes    Comment: once weekly   Drug use: Not Currently    Types: Cocaine, "Crack" cocaine    Comment: smoked crack     Allergies   Patient has no known allergies.   Review of Systems Review of Systems  Respiratory:  Positive for cough.      Physical Exam Triage Vital Signs ED Triage Vitals [09/10/22 1022]  Encounter Vitals Group     BP 119/77     Systolic BP Percentile      Diastolic BP  Percentile      Pulse Rate 70     Resp 16     Temp 98.3 F (36.8 C)     Temp Source Oral     SpO2 94 %     Weight 240 lb (108.9 kg)     Height 5\' 8"  (1.727 m)     Head Circumference      Peak Flow      Pain Score 0     Pain Loc      Pain Education  Exclude from Growth Chart    No data found.  Updated Vital Signs BP 119/77 (BP Location: Right Arm)   Pulse 70   Temp 98.3 F (36.8 C) (Oral)   Resp 16   Ht 5\' 8"  (1.727 m)   Wt 108.9 kg   SpO2 94%   BMI 36.49 kg/m   Visual Acuity Right Eye Distance:   Left Eye Distance:   Bilateral Distance:    Right Eye Near:   Left Eye Near:    Bilateral Near:     Physical Exam Vitals reviewed.  Constitutional:      General: He is not in acute distress.    Appearance: He is not ill-appearing, toxic-appearing or diaphoretic.  HENT:     Right Ear: Tympanic membrane and ear canal normal.     Left Ear: Tympanic membrane and ear canal normal.     Nose: Congestion present.     Mouth/Throat:     Mouth: Mucous membranes are moist.     Comments: There is very mild erythema of the oropharynx with clear mucus draining in the posterior oropharynx. Eyes:     Extraocular Movements: Extraocular movements intact.     Conjunctiva/sclera: Conjunctivae normal.     Pupils: Pupils are equal, round, and reactive to light.  Cardiovascular:     Rate and Rhythm: Normal rate and regular rhythm.     Heart sounds: No murmur heard. Pulmonary:     Effort: No respiratory distress.     Breath sounds: No stridor. No wheezing, rhonchi or rales.  Musculoskeletal:     Cervical back: Neck supple.  Lymphadenopathy:     Cervical: No cervical adenopathy.  Skin:    Capillary Refill: Capillary refill takes less than 2 seconds.     Coloration: Skin is not jaundiced or pale.  Neurological:     General: No focal deficit present.     Mental Status: He is alert and oriented to person, place, and time.  Psychiatric:        Behavior: Behavior normal.       UC Treatments / Results  Labs (all labs ordered are listed, but only abnormal results are displayed) Labs Reviewed  SARS CORONAVIRUS 2 (TAT 6-24 HRS)    EKG   Radiology No results found.  Procedures Procedures (including critical care time)  Medications Ordered in UC Medications - No data to display  Initial Impression / Assessment and Plan / UC Course  I have reviewed the triage vital signs and the nursing notes.  Pertinent labs & imaging results that were available during my care of the patient were reviewed by me and considered in my medical decision making (see chart for details).       Vital signs are reassuring, and lung exam is normal.  He is moving air well   COVID swab was done today, and if positive he is a candidate for Paxlovid.  His EGFR in 2023 was 87  Phenergan with dextromethorphan is sent in for cough.  Final Clinical Impressions(s) / UC Diagnoses   Final diagnoses:  Viral URI with cough     Discharge Instructions      Take Phenergan with dextromethorphan syrup--5 mL or 1 teaspoon every 6 hours as needed for cough   You have been swabbed for COVID, and the test will result in the next 24 hours. Our staff will call you if positive. If the COVID test is positive, you should quarantine until you are fever free for 24 hours and  you are starting to feel better, and then take added precautions for the next 5 days, such as physical distancing/wearing a mask and good hand hygiene/washing.      ED Prescriptions     Medication Sig Dispense Auth. Provider   promethazine-dextromethorphan (PROMETHAZINE-DM) 6.25-15 MG/5ML syrup Take 5 mLs by mouth 4 (four) times daily as needed for cough. 118 mL Zenia Resides, MD      PDMP not reviewed this encounter.   Zenia Resides, MD 09/10/22 1040

## 2022-09-11 ENCOUNTER — Other Ambulatory Visit: Payer: Self-pay

## 2022-09-11 LAB — SARS CORONAVIRUS 2 (TAT 6-24 HRS): SARS Coronavirus 2: NEGATIVE

## 2022-09-16 ENCOUNTER — Telehealth: Payer: Self-pay | Admitting: *Deleted

## 2022-09-16 NOTE — Telephone Encounter (Signed)
[facility]  TCS w/Dr.Carver ASA 2

## 2022-09-23 ENCOUNTER — Other Ambulatory Visit: Payer: Self-pay

## 2022-09-23 ENCOUNTER — Encounter: Payer: Self-pay | Admitting: *Deleted

## 2022-09-23 ENCOUNTER — Other Ambulatory Visit: Payer: Self-pay | Admitting: *Deleted

## 2022-09-23 MED ORDER — PEG 3350-KCL-NA BICARB-NACL 420 G PO SOLR
4000.0000 mL | Freq: Once | ORAL | 0 refills | Status: AC
  Filled 2022-09-23 – 2022-11-29 (×2): qty 4000, 1d supply, fill #0

## 2022-09-23 NOTE — Telephone Encounter (Signed)
Pt is an ASA 3. He has been scheduled for 10/21/22. Instructions mailed and prep sent to the pharmacy.

## 2022-09-27 ENCOUNTER — Other Ambulatory Visit: Payer: Self-pay

## 2022-10-10 ENCOUNTER — Other Ambulatory Visit: Payer: Self-pay | Admitting: Internal Medicine

## 2022-10-10 DIAGNOSIS — N400 Enlarged prostate without lower urinary tract symptoms: Secondary | ICD-10-CM

## 2022-10-11 ENCOUNTER — Encounter: Payer: Self-pay | Admitting: Pharmacist

## 2022-10-11 ENCOUNTER — Other Ambulatory Visit: Payer: Self-pay

## 2022-10-11 ENCOUNTER — Other Ambulatory Visit: Payer: Self-pay | Admitting: Pharmacist

## 2022-10-11 DIAGNOSIS — N400 Enlarged prostate without lower urinary tract symptoms: Secondary | ICD-10-CM

## 2022-10-11 MED ORDER — ACYCLOVIR 400 MG PO TABS
400.0000 mg | ORAL_TABLET | Freq: Two times a day (BID) | ORAL | 0 refills | Status: DC
  Filled 2022-10-11: qty 14, 7d supply, fill #0

## 2022-10-11 MED ORDER — TAMSULOSIN HCL 0.4 MG PO CAPS
0.4000 mg | ORAL_CAPSULE | Freq: Every day | ORAL | 0 refills | Status: DC
  Filled 2022-10-11: qty 7, 7d supply, fill #0

## 2022-10-11 MED ORDER — ATORVASTATIN CALCIUM 20 MG PO TABS
20.0000 mg | ORAL_TABLET | Freq: Every day | ORAL | 0 refills | Status: DC
  Filled 2022-10-11: qty 7, 7d supply, fill #0

## 2022-10-15 NOTE — Patient Instructions (Signed)
Samuel Irwin  10/15/2022     @PREFPERIOPPHARMACY @   Your procedure is scheduled on  10/21/2022.   Report to [facility] at  1130  A.M.   Call this number if you have problems the morning of surgery:  [phone]  If you experience any cold or flu symptoms such as cough, fever, chills, shortness of breath, etc. between now and your scheduled surgery, please notify us at the above number.   Remember:  Follow the diet and prep instructions given to you by the office.     Take these medicines the morning of surgery with A SIP OF WATER                                 omeprazole, flomax.     Do not wear jewelry, make-up or nail polish, including gel polish,  artificial nails, or any other type of covering on natural nails (fingers and  toes).  Do not wear lotions, powders, or perfumes, or deodorant.  Do not shave 48 hours prior to surgery.  Men may shave face and neck.  Do not bring valuables to the hospital.  [facility] is not responsible for any belongings or valuables.  Contacts, dentures or bridgework may not be worn into surgery.  Leave your suitcase in the car.  After surgery it may be brought to your room.  For patients admitted to the hospital, discharge time will be determined by your treatment team.  Patients discharged the day of surgery will not be allowed to drive home and must have someone with them for 24 hours.     Special instructions:   DO NOT smoke tobacco or vape for 24 hours before your procedure.  Please read over the following fact sheets that you were given. Anesthesia Post-op Instructions and Care and Recovery After Surgery       Colonoscopy, Adult, Care After The following information offers guidance on how to care for yourself after your procedure. Your health care provider may also give you more specific instructions. If you have problems or questions, contact your health care provider. What can I expect after the procedure? After  the procedure, it is common to have: A small amount of blood in your stool for 24 hours after the procedure. Some gas. Mild cramping or bloating of your abdomen. Follow these instructions at home: Eating and drinking  Drink enough fluid to keep your urine pale yellow. Follow instructions from your health care provider about eating or drinking restrictions. Resume your normal diet as told by your health care provider. Avoid heavy or fried foods that are hard to digest. Activity Rest as told by your health care provider. Avoid sitting for a long time without moving. Get up to take short walks every 1-2 hours. This is important to improve blood flow and breathing. Ask for help if you feel weak or unsteady. Return to your normal activities as told by your health care provider. Ask your health care provider what activities are safe for you. Managing cramping and bloating  Try walking around when you have cramps or feel bloated. If directed, apply heat to your abdomen as told by your health care provider. Use the heat source that your health care provider recommends, such as a moist heat pack or a heating pad. Place a towel between your skin and the heat source. Leave the heat on for 20-30 minutes.  Remove the heat if your skin turns bright red. This is especially important if you are unable to feel pain, heat, or cold. You have a greater risk of getting burned. General instructions If you were given a sedative during the procedure, it can affect you for several hours. Do not drive or operate machinery until your health care provider says that it is safe. For the first 24 hours after the procedure: Do not sign important documents. Do not drink alcohol. Do your regular daily activities at a slower pace than normal. Eat soft foods that are easy to digest. Take over-the-counter and prescription medicines only as told by your health care provider. Keep all follow-up visits. This is  important. Contact a health care provider if: You have blood in your stool 2-3 days after the procedure. Get help right away if: You have more than a small spotting of blood in your stool. You have large blood clots in your stool. You have swelling of your abdomen. You have nausea or vomiting. You have a fever. You have increasing pain in your abdomen that is not relieved with medicine. These symptoms may be an emergency. Get help right away. Call 911. Do not wait to see if the symptoms will go away. Do not drive yourself to the hospital. Summary After the procedure, it is common to have a small amount of blood in your stool. You may also have mild cramping and bloating of your abdomen. If you were given a sedative during the procedure, it can affect you for several hours. Do not drive or operate machinery until your health care provider says that it is safe. Get help right away if you have a lot of blood in your stool, nausea or vomiting, a fever, or increased pain in your abdomen. This information is not intended to replace advice given to you by your health care provider. Make sure you discuss any questions you have with your health care provider. Document Revised: 03/26/2022 Document Reviewed: 10/04/2020 Elsevier Patient Education  2024 Else[employer] Inc. Monitored Anesthesia Care, Care After The following information offers guidance on how to care for yourself after your procedure. Your health care provider may also give you more specific instructions. If you have problems or questions, contact your health care provider. What can I expect after the procedure? After the procedure, it is common to have: Tiredness. Little or no memory about what happened during or after the procedure. Impaired judgment when it comes to making decisions. Nausea or vomiting. Some trouble with balance. Follow these instructions at home: For the time period you were told by your health care  provider:  Rest. Do not participate in activities where you could fall or become injured. Do not drive or use machinery. Do not drink alcohol. Do not take sleeping pills or medicines that cause drowsiness. Do not make important decisions or sign legal documents. Do not take care of children on your own. Medicines Take over-the-counter and prescription medicines only as told by your health care provider. If you were prescribed antibiotics, take them as told by your health care provider. Do not stop using the antibiotic even if you start to feel better. Eating and drinking Follow instructions from your health care provider about what you may eat and drink. Drink enough fluid to keep your urine pale yellow. If you vomit: Drink clear fluids slowly and in small amounts as you are able. Clear fluids include water, ice chips, low-calorie sports drinks, and fruit juice that has water  added to it (diluted fruit juice). Eat light and bland foods in small amounts as you are able. These foods include bananas, applesauce, rice, lean meats, toast, and crackers. General instructions  Have a responsible adult stay with you for the time you are told. It is important to have someone help care for you until you are awake and alert. If you have sleep apnea, surgery and some medicines can increase your risk for breathing problems. Follow instructions from your health care provider about wearing your sleep device: When you are sleeping. This includes during daytime naps. While taking prescription pain medicines, sleeping medicines, or medicines that make you drowsy. Do not use any products that contain nicotine or tobacco. These products include cigarettes, chewing tobacco, and vaping devices, such as e-cigarettes. If you need help quitting, ask your health care provider. Contact a health care provider if: You feel nauseous or vomit every time you eat or drink. You feel light-headed. You are still sleepy or  having trouble with balance after 24 hours. You get a rash. You have a fever. You have redness or swelling around the IV site. Get help right away if: You have trouble breathing. You have new confusion after you get home. These symptoms may be an emergency. Get help right away. Call 911. Do not wait to see if the symptoms will go away. Do not drive yourself to the hospital. This information is not intended to replace advice given to you by your health care provider. Make sure you discuss any questions you have with your health care provider. Document Revised: 07/09/2021 Document Reviewed: 07/09/2021 Elsevier Patient Education  2024 [employer].

## 2022-10-16 ENCOUNTER — Encounter (HOSPITAL_COMMUNITY): Payer: Self-pay

## 2022-10-16 ENCOUNTER — Encounter (HOSPITAL_COMMUNITY)
Admission: RE | Admit: 2022-10-16 | Discharge: 2022-10-16 | Disposition: A | Discharge status: Home / Self Care | Payer: Managed Care, Other (non HMO) | Source: Ambulatory Visit | Attending: Internal Medicine | Admitting: Internal Medicine

## 2022-10-16 ENCOUNTER — Telehealth: Payer: Self-pay | Admitting: *Deleted

## 2022-10-16 VITALS — BP 125/83 | HR 70 | Temp 97.8°F | Resp 18 | Ht 68.0 in | Wt 240.1 lb

## 2022-10-16 DIAGNOSIS — Z01818 Encounter for other preprocedural examination: Secondary | ICD-10-CM

## 2022-10-16 DIAGNOSIS — Z01812 Encounter for preprocedural laboratory examination: Secondary | ICD-10-CM | POA: Insufficient documentation

## 2022-10-16 DIAGNOSIS — R7303 Prediabetes: Secondary | ICD-10-CM

## 2022-10-16 HISTORY — DX: Prediabetes: R73.03

## 2022-10-16 LAB — RAPID URINE DRUG SCREEN, HOSP PERFORMED
Amphetamines: NOT DETECTED
Barbiturates: NOT DETECTED
Benzodiazepines: NOT DETECTED
Cocaine: POSITIVE — AB
Opiates: NOT DETECTED
Tetrahydrocannabinol: NOT DETECTED

## 2022-10-16 LAB — BASIC METABOLIC PANEL
Anion gap: 8 (ref 5–15)
BUN: 14 mg/dL (ref 6–20)
CO2: 26 mmol/L (ref 22–32)
Calcium: 9.3 mg/dL (ref 8.9–10.3)
Chloride: 102 mmol/L (ref 98–111)
Creatinine, Ser: 0.91 mg/dL (ref 0.61–1.24)
GFR, Estimated: >60 mL/min (ref >60)
Glucose, Bld: 105 mg/dL — ABNORMAL HIGH (ref 70–99)
Potassium: 3.4 mmol/L — ABNORMAL LOW (ref 3.5–5.1)
Sodium: 136 mmol/L (ref 135–145)

## 2022-10-16 NOTE — Telephone Encounter (Signed)
Called pt, LMOVM to call back. Mychart message sent.

## 2022-10-16 NOTE — Telephone Encounter (Signed)
-----   Message from Nurse Katheren Shams sent at 10/16/2022  2:18 PM EDT ----- Regarding: cancellation Patient tested positive for cocaine. He will need to be off the drug for 2 weeks before rescheduling.

## 2022-10-17 NOTE — Telephone Encounter (Signed)
Called pt, LMOVM to call back. He read [facility] message about rescheduling procedure.

## 2022-10-17 NOTE — Telephone Encounter (Signed)
Spoke with pt. He is aware of why procedure cancelled and why. He has been rescheduled to 9/30. Aware will get a new pre-op appt and will send new instructions. He is aware he needs be clean off cocaine at least x 2 weeks. He stated he would. He is aware he will be retested at pre-op again. He has not picked up his prep from the pharmacy either.

## 2022-10-18 ENCOUNTER — Ambulatory Visit: Payer: Managed Care, Other (non HMO) | Attending: Internal Medicine | Admitting: Internal Medicine

## 2022-10-18 ENCOUNTER — Encounter: Payer: Self-pay | Admitting: Internal Medicine

## 2022-10-18 ENCOUNTER — Other Ambulatory Visit: Payer: Self-pay

## 2022-10-18 ENCOUNTER — Encounter: Payer: Self-pay | Admitting: *Deleted

## 2022-10-18 VITALS — BP 109/73 | HR 69 | Temp 98.1°F | Ht 68.0 in | Wt 242.0 lb

## 2022-10-18 DIAGNOSIS — R252 Cramp and spasm: Secondary | ICD-10-CM

## 2022-10-18 DIAGNOSIS — Z0000 Encounter for general adult medical examination without abnormal findings: Secondary | ICD-10-CM

## 2022-10-18 DIAGNOSIS — R7303 Prediabetes: Secondary | ICD-10-CM | POA: Diagnosis not present

## 2022-10-18 DIAGNOSIS — E782 Mixed hyperlipidemia: Secondary | ICD-10-CM

## 2022-10-18 DIAGNOSIS — N400 Enlarged prostate without lower urinary tract symptoms: Secondary | ICD-10-CM

## 2022-10-18 DIAGNOSIS — Z23 Encounter for immunization: Secondary | ICD-10-CM

## 2022-10-18 DIAGNOSIS — G8929 Other chronic pain: Secondary | ICD-10-CM

## 2022-10-18 DIAGNOSIS — R0981 Nasal congestion: Secondary | ICD-10-CM

## 2022-10-18 DIAGNOSIS — F17210 Nicotine dependence, cigarettes, uncomplicated: Secondary | ICD-10-CM

## 2022-10-18 DIAGNOSIS — I10 Essential (primary) hypertension: Secondary | ICD-10-CM

## 2022-10-18 DIAGNOSIS — Z125 Encounter for screening for malignant neoplasm of prostate: Secondary | ICD-10-CM

## 2022-10-18 DIAGNOSIS — E66812 Obesity, class 2: Secondary | ICD-10-CM

## 2022-10-18 DIAGNOSIS — M545 Low back pain, unspecified: Secondary | ICD-10-CM

## 2022-10-18 DIAGNOSIS — Z6836 Body mass index (BMI) 36.0-36.9, adult: Secondary | ICD-10-CM

## 2022-10-18 LAB — POCT GLYCOSYLATED HEMOGLOBIN (HGB A1C): HbA1c, POC (prediabetic range): 5.8 % (ref 5.7–6.4)

## 2022-10-18 LAB — GLUCOSE, POCT (MANUAL RESULT ENTRY): POC Glucose: 107 mg/dl — AB (ref 70–99)

## 2022-10-18 MED ORDER — TAMSULOSIN HCL 0.4 MG PO CAPS
0.4000 mg | ORAL_CAPSULE | Freq: Every day | ORAL | 6 refills | Status: DC
  Filled 2022-10-18: qty 30, 30d supply, fill #0
  Filled 2022-11-05 – 2022-11-12 (×2): qty 30, 30d supply, fill #1
  Filled 2022-12-08: qty 30, 30d supply, fill #2
  Filled 2023-01-10: qty 30, 30d supply, fill #3
  Filled 2023-02-10: qty 30, 30d supply, fill #4
  Filled 2023-03-21: qty 30, 30d supply, fill #5
  Filled 2023-04-15: qty 30, 30d supply, fill #6

## 2022-10-18 MED ORDER — ACYCLOVIR 400 MG PO TABS
400.0000 mg | ORAL_TABLET | Freq: Two times a day (BID) | ORAL | 1 refills | Status: DC
  Filled 2022-10-18: qty 60, 30d supply, fill #0
  Filled 2022-11-05 – 2022-11-12 (×2): qty 60, 30d supply, fill #1
  Filled 2022-12-08: qty 60, 30d supply, fill #2
  Filled 2023-01-10: qty 60, 30d supply, fill #3
  Filled 2023-02-10: qty 60, 30d supply, fill #4
  Filled 2023-03-21: qty 60, 30d supply, fill #5

## 2022-10-18 MED ORDER — ATORVASTATIN CALCIUM 20 MG PO TABS
20.0000 mg | ORAL_TABLET | Freq: Every day | ORAL | 6 refills | Status: DC
Start: 2022-10-18 — End: 2023-05-14
  Filled 2022-10-18: qty 30, 30d supply, fill #0
  Filled 2022-11-05 – 2022-11-12 (×2): qty 30, 30d supply, fill #1
  Filled 2022-12-08: qty 30, 30d supply, fill #2
  Filled 2023-01-10: qty 30, 30d supply, fill #3
  Filled 2023-02-10: qty 30, 30d supply, fill #4
  Filled 2023-03-21: qty 30, 30d supply, fill #5
  Filled 2023-04-15: qty 30, 30d supply, fill #6

## 2022-10-18 NOTE — Patient Instructions (Signed)
Purchase Loratidine/Claritin over the counter and use as needed to help decrease nasal congestion.

## 2022-10-18 NOTE — Progress Notes (Signed)
Patient ID: Samuel Irwin, male    DOB: Feb 01, 1961  MRN: [id number]  CC: Annual Exam (Physical. Med refills. /Intermittent ongoing pain in R knee )   Subjective: Samuel Irwin is a 62 y.o. male who presents for annual exam for work His concerns today include:  [name], HL, obesity, GERD, ED, tob dep, BPH, OSA on Bipap, Vit D def, dep/anxiety, paranoia (see 08/2017 visit), OA knee, COVID 19 01/2020.     Patient is needing a work physical for his insurance.  HTN: Reports compliance with hydrochlorothiazide.  Feels he can do better with limiting salt in the foods.  States that he still loves chips. No shortness of breath.  Occasional chest pain. Reports swelling in the legs above his tube socks that stop at the ankle He continues to be a light smoker.  States that he smokes care and there on the weekends if he has a drink.  [name]: Results for orders placed or performed in visit on 10/18/22  POCT glycosylated hemoglobin (Hb A1C)  Result Value Ref Range   Hemoglobin A1C     HbA1c POC (<> result, manual entry)     HbA1c, POC (prediabetic range) 5.8 5.7 - 6.4 %   HbA1c, POC (controlled diabetic range)    POCT glucose (manual entry)  Result Value Ref Range   POC Glucose 107 (A) 70 - 99 mg/dl    Weight is up 6 pounds since last visit with me in March.  He attributes this to snacking on things like apple pies and butter cookies.  His significant other packs his lunch for work each day.  Usually carries a salad but snacks and not healthy.  He loves fruits.  Drinks mainly water. Goes to the gym to swim but has not been able to to do it as often as he would like due to his work schedule and working 6 days a week.  HL: Reports compliance with taking atorvastatin.  He endorses cramps in the feet and sometimes if he flexes muscles when he is in the pool.  OSA: Reports compliance with his BiPAP.  Feels he benefits from it and wakes up refreshed in the mornings.  [facility]  refill on acyclovir and Flomax.  Patient Active Problem List   Diagnosis Date Noted   Right lower quadrant abdominal pain 09/29/2020   Primary osteoarthritis, right shoulder 09/10/2020   Chronic pain of both shoulders 12/07/2019   Prediabetes 09/21/2019   Paranoid state (HCC) 09/20/2019   Tobacco use 02/10/2019   Chronic cough 12/29/2018   Other headache syndrome 02/12/2018   Arthritis pain of hand 02/12/2018   Scrotal mass 08/04/2017   Obesity (BMI 35.0-39.9 without comorbidity) 03/10/2017   Burning sensation of feet 09/23/2016   Floaters in visual field, right 09/23/2016   Osteoarthritis of right knee 02/28/2016   Chronic bilateral low back pain 02/28/2016   Poor dentition 01/29/2016   Essential hypertension 06/06/2015   BPH (benign prostatic hyperplasia) 05/04/2015   Other seasonal allergic rhinitis 05/04/2015   Target of perceived adverse discrimination or persecution 04/05/2015   Hemorrhoids 02/23/2015   Constipation 02/23/2015   GERD (gastroesophageal reflux disease) 09/21/2014   Carpal tunnel syndrome 08/12/2014   Erectile dysfunction 08/20/2013   Hyperlipidemia 08/20/2013   OSA (obstructive sleep apnea) 08/24/2012     Current Outpatient Medications on File Prior to Visit  Medication Sig Dispense Refill   azelastine (ASTELIN) 0.1 % nasal spray PLACE 2 SPRAYS INTO BOTH NOSTRILS 2 (TWO) TIMES  DAILY. USE IN EACH NOSTRIL AS DIRECTED (Patient taking differently: as needed for allergies.) 30 mL 0   fluticasone (FLONASE) 50 MCG/ACT nasal spray PLACE 2 SPRAYS INTO BOTH NOSTRILS DAILY. (Patient taking differently: Place 2 sprays into both nostrils as needed for allergies.) 16 g 2   hydrochlorothiazide (HYDRODIURIL) 25 MG tablet Take 1 tablet (25 mg total) by mouth daily. 90 tablet 1   omeprazole (PRILOSEC) 20 MG capsule Take by mouth as needed (heartburn).     promethazine-dextromethorphan (PROMETHAZINE-DM) 6.25-15 MG/5ML syrup Take 5 mLs by mouth 4 (four) times daily as needed  for cough. 118 mL 0   tadalafil (CIALIS) 10 MG tablet Take 10 mg by mouth daily as needed for erectile dysfunction.     tadalafil (CIALIS) 20 MG tablet Take 20 mg by mouth daily as needed for erectile dysfunction.     No current facility-administered medications on file prior to visit.    No Known Allergies  Social History   Socioeconomic History   Marital status: Single    Spouse name: Not on file   Number of children: Not on file   Years of education: Not on file   Highest education level: Associate degree: occupational, Scientist, product/process development, or vocational program  Occupational History   Occupation: unemployed  Tobacco Use   Smoking status: Every Day    Current packs/day: 0.25    Average packs/day: 0.3 packs/day for 43.0 years (10.8 ttl pk-yrs)    Types: Cigarettes   Smokeless tobacco: Never   Tobacco comments:    occas still smokes when he drinks  Vaping Use   Vaping status: Never Used  Substance and Sexual Activity   Alcohol use: Yes    Comment: once weekly   Drug use: Not Currently    Types: Cocaine, "Crack" cocaine    Comment: smoked crack   Sexual activity: Not Currently    Comment: "when I drink"  Other Topics Concern   Not on file  Social History Narrative   Not on file   Social Determinants of Health   Financial Resource Strain: Medium Risk (10/14/2022)   Overall Financial Resource Strain (CARDIA)    Difficulty of Paying Living Expenses: Somewhat hard  Food Insecurity: Food Insecurity Present (10/14/2022)   Hunger Vital Sign    Worried About Running Out of Food in the Last Year: Sometimes true    Ran Out of Food in the Last Year: Sometimes true  Transportation Needs: No Transportation Needs (10/14/2022)   PRAPARE - Administrator, Civil Service (Medical): No    Lack of Transportation (Non-Medical): No  Physical Activity: Not on file  Stress: Not on file  Social Connections: Unknown (10/14/2022)   Social Connection and Isolation Panel [NHANES]     Frequency of Communication with Friends and Family: Once a week    Frequency of Social Gatherings with Friends and Family: Patient declined    Attends Religious Services: 1 to 4 times per year    Active Member of [employer] or Organizations: No    Attends Engineer, structural: Not on file    Marital Status: Never married  Intimate Partner Violence: Not on file    Family History  Problem Relation Age of Onset   Hypertension Mother    Cancer Father        lung   Colon cancer Neg Hx     Past Surgical History:  Procedure Laterality Date   BIOPSY  10/18/2014   Procedure: BIOPSY (Gastric);  Surgeon: Darleene Cleaver  Fields, MD;  Location: [facility];  Service: Endoscopy;;   CARPAL TUNNEL RELEASE     R hand   COLONOSCOPY WITH PROPOFOL N/A 10/18/2014   WUJ:[id number] size external hemorrhoids/left colon is redundant   ESOPHAGOGASTRODUODENOSCOPY (EGD) WITH PROPOFOL N/A 10/18/2014   SLF: epigastric pain due to moderate NSAID gastritis   FEMUR SURGERY     left   HERNIA REPAIR  04/07/12   umb hernia repair   INSERTION OF MESH N/A 04/07/2012   Procedure: INSERTION OF MESH;  Surgeon: Lodema Pilot, DO;  Location: [facility];  Service: General;  Laterality: N/A;   KNEE ARTHROSCOPY     right   MINOR CARPAL TUNNEL Left 01/31/2016   Procedure: MINOR CARPAL TUNNEL RELEASE LEFT;  Surgeon: Dairl Ponder, MD;  Location:  SUR[facility]ERY [facility];  Service: Orthopedics;  Laterality: Left;  Local   TOE SURGERY     dislocated lt foot   UMBILICAL HERNIA REPAIR N/A 04/07/2012   Procedure: HERNIA REPAIR UMBILICAL ADULT;  Surgeon: Lodema Pilot, DO;  Location: [facility] OR;  Service: General;  Laterality: N/A;  open umbilical hernia repair with mesh    ROS: Review of Systems  HENT:         Complains of sinus congestion which has been chronic for him for a few years.  He has Astelin and Flonase nasal sprays.  He uses them as needed but tries not to use them too much because they burn and make him sneeze.  Denies any bloody  mucus.  Respiratory:  Negative for cough and shortness of breath.   Gastrointestinal:  Negative for abdominal distention and anal bleeding.  Musculoskeletal:        Complains of chronic pain in the right knee.  Had arthroscopic surgery several years ago.  Gets intermittent swelling in the knee.  Worse when he is sitting up moving around.  Complains of intermittent pain across the lower back which he describes as a burning.  Occurs intermittently at work after he has been walking for a while.  He would have to sit down.  Pain does not radiate down the legs.  No numbness or tingling.   Negative except as stated above  PHYSICAL EXAM: BP 109/73 (BP Location: Left Arm, Patient Position: Sitting, Cuff Size: Large)   Pulse 69   Temp 98.1 F (36.7 C) (Oral)   Ht 5\' 8"  (1.727 m)   Wt 242 lb (109.8 kg)   SpO2 96%   BMI 36.80 kg/m   Wt Readings from Last 3 Encounters:  10/18/22 242 lb (109.8 kg)  10/16/22 240 lb 1.3 oz (108.9 kg)  09/10/22 240 lb (108.9 kg)    Physical Exam   General appearance - alert, well appearing, and in no distress Mental status - normal mood, behavior, speech, dress, motor activity, and thought processes Eyes - pupils equal and reactive, extraocular eye movements intact Ears - bilateral TM's and external ear canals normal Nose - normal and patent, no erythema, discharge or polyps Mouth - mucous membranes moist, pharynx normal without lesions Neck - supple, no significant adenopathy Lymphatics - no palpable lymphadenopathy, no hepatosplenomegaly Chest - clear to auscultation, no wheezes, rales or rhonchi, symmetric air entry Heart - normal rate, regular rhythm, normal S1, S2, no murmurs, rubs, clicks or gallops Abdomen - soft, nontender, nondistended, no masses or organomegaly Musculoskeletal -right knee: Mild joint enlargement.  No point tenderness.  He seems to have pretty good range of motion. Extremities -trace lower extremity edema.     Latest  Ref Rng &  Units 10/16/2022    8:40 AM 09/14/2021    9:10 AM 08/11/2020    2:16 PM  CMP  Glucose 70 - 99 mg/dL 161  096  045   BUN 6 - 20 mg/dL 14  12  10    Creatinine 0.61 - 1.24 mg/dL 4.09  8.11  9.14   Sodium 135 - 145 mmol/L 136  141  140   Potassium 3.5 - 5.1 mmol/L 3.4  3.9  3.8   Chloride 98 - 111 mmol/L 102  101  100   CO2 22 - 32 mmol/L 26  23  24    Calcium 8.9 - 10.3 mg/dL 9.3  9.6  78.2   Total Protein 6.0 - 8.5 g/dL  7.4  7.4   Total Bilirubin 0.0 - 1.2 mg/dL  0.7  0.8   Alkaline Phos 44 - 121 IU/L  80  71   AST 0 - 40 IU/L  16  16   ALT 0 - 44 IU/L  23  21    Lipid Panel     Component Value Date/Time   CHOL 148 09/14/2021 0910   TRIG 109 09/14/2021 0910   HDL 50 09/14/2021 0910   CHOLHDL 3.0 09/14/2021 0910   CHOLHDL 4.2 03/09/2014 1241   VLDL 36 03/09/2014 1241   LDLCALC 78 09/14/2021 0910    CBC    Component Value Date/Time   WBC 7.0 09/14/2021 0910   WBC 5.7 12/27/2016 1424   RBC 5.22 09/14/2021 0910   RBC 4.92 12/27/2016 1424   HGB 16.1 09/14/2021 0910   HCT 47.1 09/14/2021 0910   PLT 296 09/14/2021 0910   MCV 90 09/14/2021 0910   MCH 30.8 09/14/2021 0910   MCH 30.5 12/27/2016 1424   MCHC 34.2 09/14/2021 0910   MCHC 33.6 12/27/2016 1424   RDW 13.9 09/14/2021 0910   LYMPHSABS 1.9 04/30/2014 1246   MONOABS 1.9 (H) 04/30/2014 1246   EOSABS 0.0 04/30/2014 1246   BASOSABS 0.0 04/30/2014 1246    ASSESSMENT AND PLAN: 1. Annual physical exam   2. Essential hypertension At goal.  Continue HCTZ. - CBC - Comprehensive metabolic panel  3. Class 2 severe obesity with serious comorbidity and body mass index (BMI) of 36.0 to 36.9 in adult, unspecified obesity type [facility]) Discussed on encourage healthy eating habits.  Recommend healthier snacks like fruits/nuts.  Encouraged him to try to get in some exercise at home since going to the gym is not always convenient based on his work schedule.  4. Prediabetes See #3 above. - POCT glycosylated hemoglobin (Hb A1C) -  POCT glucose (manual entry)  5. Mixed hyperlipidemia - atorvastatin (LIPITOR) 20 MG tablet; Take 1 tablet (20 mg total) by mouth daily.  Dispense: 30 tablet; Refill: 6 - Lipid panel  6. Sinus congestion Recommend purchasing some loratadine over-the-counter and using daily.  Use the Flonase as needed.  Declines referral to ENT at this time.  Would like to see how he does with the loratadine.  7. Chronic pain of right knee - AMB referral to orthopedics  8. Muscle cramps Most likely due to the atorvastatin.  Advised that we can change him to Pravachol or Crestor which causes less cramps.  Patient prefers to hold off for now stating that it occurs only when he swims.  9. Chronic bilateral low back pain without sciatica Patient thinks this would improve with him getting more comfortable shoes to wear at work.  He will try this first  10. Benign prostatic hyperplasia without lower urinary tract symptoms - tamsulosin (FLOMAX) 0.4 MG CAPS capsule; Take 1 capsule (0.4 mg total) by mouth daily.  Dispense: 30 capsule; Refill: 6  11. 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  12. Prostate cancer screening Reasonable to screening. - PSA  13. Light smoker Advised that any nicotine use is not good for his health.  Encouraged him to quit completely.    Patient was given the opportunity to ask questions.  Patient verbalized understanding of the plan and was able to repeat key elements of the plan.   This documentation was completed using Paediatric nurse.  Any transcriptional errors are unintentional.  Orders Placed This Encounter  Procedures   CBC   Comprehensive metabolic panel   Lipid panel   PSA   AMB referral to orthopedics   POCT glycosylated hemoglobin (Hb A1C)   POCT glucose (manual entry)     Requested Prescriptions   Signed Prescriptions Disp Refills   atorvastatin (LIPITOR) 20 MG tablet 30 tablet 6     Sig: Take 1 tablet (20 mg total) by mouth daily.   tamsulosin (FLOMAX) 0.4 MG CAPS capsule 30 capsule 6    Sig: Take 1 capsule (0.4 mg total) by mouth daily.   acyclovir (ZOVIRAX) 400 MG tablet 180 tablet 1    Sig: Take 1 tablet (400 mg total) by mouth 2 (two) times daily.    Return in about 4 months (around 02/17/2023) for Nurse visit in 2 wks for flu shot.  Jonah Blue, MD, FACP

## 2022-10-19 LAB — COMPREHENSIVE METABOLIC PANEL
ALT: 26 IU/L (ref 0–44)
AST: 19 IU/L (ref 0–40)
Albumin: 4.8 g/dL (ref 3.8–4.9)
Alkaline Phosphatase: 82 IU/L (ref 44–121)
BUN/Creatinine Ratio: 11 (ref 10–24)
BUN: 11 mg/dL (ref 8–27)
Bilirubin Total: 0.9 mg/dL (ref 0.0–1.2)
CO2: 24 mmol/L (ref 20–29)
Calcium: 10 mg/dL (ref 8.6–10.2)
Chloride: 98 mmol/L (ref 96–106)
Creatinine, Ser: 1 mg/dL (ref 0.76–1.27)
Globulin, Total: 3 g/dL (ref 1.5–4.5)
Glucose: 97 mg/dL (ref 70–99)
Potassium: 3.8 mmol/L (ref 3.5–5.2)
Sodium: 138 mmol/L (ref 134–144)
Total Protein: 7.8 g/dL (ref 6.0–8.5)
eGFR: 86 mL/min/{1.73_m2} (ref >59)

## 2022-10-19 LAB — CBC
Hematocrit: 47.9 % (ref 37.5–51.0)
Hemoglobin: 16.1 g/dL (ref 13.0–17.7)
MCH: 30.1 pg (ref 26.6–33.0)
MCHC: 33.6 g/dL (ref 31.5–35.7)
MCV: 90 fL (ref 79–97)
Platelets: 272 10*3/uL (ref 150–450)
RBC: 5.35 x10E6/uL (ref 4.14–5.80)
RDW: 13.6 % (ref 11.6–15.4)
WBC: 6.2 10*3/uL (ref 3.4–10.8)

## 2022-10-19 LAB — LIPID PANEL
Chol/HDL Ratio: 2.8 ratio (ref 0.0–5.0)
Cholesterol, Total: 150 mg/dL (ref 100–199)
HDL: 54 mg/dL (ref >39)
LDL Chol Calc (NIH): 77 mg/dL (ref 0–99)
Triglycerides: 106 mg/dL (ref 0–149)
VLDL Cholesterol Cal: 19 mg/dL (ref 5–40)

## 2022-10-19 LAB — PSA: Prostate Specific Ag, Serum: 0.4 ng/mL (ref 0.0–4.0)

## 2022-10-29 ENCOUNTER — Encounter: Payer: Self-pay | Admitting: Physician Assistant

## 2022-10-29 ENCOUNTER — Other Ambulatory Visit (INDEPENDENT_AMBULATORY_CARE_PROVIDER_SITE_OTHER): Payer: Managed Care, Other (non HMO)

## 2022-10-29 ENCOUNTER — Other Ambulatory Visit: Payer: Self-pay

## 2022-10-29 ENCOUNTER — Ambulatory Visit: Payer: Managed Care, Other (non HMO) | Admitting: Physician Assistant

## 2022-10-29 DIAGNOSIS — M1711 Unilateral primary osteoarthritis, right knee: Secondary | ICD-10-CM

## 2022-10-29 MED ORDER — MELOXICAM 7.5 MG PO TABS
7.5000 mg | ORAL_TABLET | Freq: Every day | ORAL | 0 refills | Status: DC
  Filled 2022-10-29: qty 30, 30d supply, fill #0

## 2022-10-29 NOTE — Progress Notes (Signed)
Office Visit Note   Patient: Samuel Irwin           Date of Birth: April 13, 1960           MRN: [id number] Visit Date: 10/29/2022              Requested by: Marcine Matar, MD [address] [place],  [place] [address] PCP: Marcine Matar, MD  Chief Complaint  Patient presents with   Right Knee - Pain      HPI: Mr. Ludovico is a pleasant 62 year old gentleman with a chief complaint of right knee pain for about 2 months.  Denies any particular injury but says the pain comes and goes.  He says it hurts to walk especially with quick turning and moving.  He does get some swelling at times.  Takes Tylenol.  When it is hurting him rates his pain is moderate.  Last had his knees evaluated in 2018.  He has stiffness when he arises out of a chair says today he rates his pain is mild  Assessment & Plan: Visit Diagnoses:  1. Primary osteoarthritis of right knee     Plan: Exam and radiographs consistent with arthritis of his right knee especially of the medial compartment.  We talked about the natural history of this we talked about treatment including physical therapy or strengthening.  We talked about injections.  We talked about Voltaren gel and oral anti-inflammatories.  He like to try with some self-directed exercises I will give him information on this today.  Would also like to try with the Voltaren and the meloxicam.  Will follow-up in a month  Follow-Up Instructions: No follow-ups on file.   Ortho Exam  Patient is alert, oriented, no adenopathy, well-dressed, normal affect, normal respiratory effort. Right knee no effusion no erythema compartments are soft and compressible he is neurovascularly intact he does have crepitus with range of motion tender more medially with some varus alignment  Imaging: No results found. No images are attached to the encounter.  Labs: Lab Results  Component Value Date   HGBA1C 5.8 10/18/2022   HGBA1C 5.8 (H) 09/20/2019   HGBA1C  5.5 04/02/2018   LABURIC 7.2 09/20/2016   REPTSTATUS 09/21/2020 FINAL 09/19/2020   CULT  09/19/2020    NO GROUP A STREP (S.PYOGENES) ISOLATED Performed at [facility], [address] [place], [place] [address]      Lab Results  Component Value Date   ALBUMIN 4.8 10/18/2022   ALBUMIN 4.8 09/14/2021   ALBUMIN 4.7 08/11/2020    Lab Results  Component Value Date   MG 2.3 09/20/2016   Lab Results  Component Value Date   VD25OH 26.0 (L) 12/10/2019   VD25OH 41.3 04/02/2018   VD25OH 23.7 (L) 05/07/2017    No results found for: "PREALBUMIN"    Latest Ref Rng & Units 10/18/2022    4:37 PM 09/14/2021    9:10 AM 08/11/2020    2:16 PM  CBC EXTENDED  WBC 3.4 - 10.8 x10E3/uL 6.2  7.0  5.0   RBC 4.14 - 5.80 x10E6/uL 5.35  5.22  5.09   Hemoglobin 13.0 - 17.7 g/dL 82.9  56.2  13.0   HCT 37.5 - 51.0 % 47.9  47.1  44.7   Platelets 150 - 450 x10E3/uL 272  296  294      There is no height or weight on file to calculate BMI.  Orders:  Orders Placed This Encounter  Procedures  XR KNEE 3 VIEW RIGHT   No orders of the defined types were placed in this encounter.    Procedures: No procedures performed  Clinical Data: No additional findings.  ROS:  All other systems negative, except as noted in the HPI. Review of Systems  Objective: Vital Signs: There were no vitals taken for this visit.  Specialty Comments:  No specialty comments available.  PMFS History: Patient Active Problem List   Diagnosis Date Noted   Right lower quadrant abdominal pain 09/29/2020   Primary osteoarthritis, right shoulder 09/10/2020   Chronic pain of both shoulders 12/07/2019   Prediabetes 09/21/2019   Paranoid state (HCC) 09/20/2019   Tobacco use 02/10/2019   Chronic cough 12/29/2018   Other headache syndrome 02/12/2018   Arthritis pain of hand 02/12/2018   Scrotal mass 08/04/2017   Obesity (BMI 35.0-39.9 without comorbidity) 03/10/2017   Burning sensation of feet 09/23/2016    Floaters in visual field, right 09/23/2016   Osteoarthritis of right knee 02/28/2016   Chronic bilateral low back pain 02/28/2016   Poor dentition 01/29/2016   Essential hypertension 06/06/2015   BPH (benign prostatic hyperplasia) 05/04/2015   Other seasonal allergic rhinitis 05/04/2015   Target of perceived adverse discrimination or persecution 04/05/2015   Hemorrhoids 02/23/2015   Constipation 02/23/2015   GERD (gastroesophageal reflux disease) 09/21/2014   Carpal tunnel syndrome 08/12/2014   Erectile dysfunction 08/20/2013   Hyperlipidemia 08/20/2013   OSA (obstructive sleep apnea) 08/24/2012   Past Medical History:  Diagnosis Date   Constipation    Cough    Generalized headaches    GERD (gastroesophageal reflux disease)    H/O hiatal hernia    Heart murmur    Herpes    Hypertension    Pre-diabetes    Sleep apnea    uses BIPAP   Umbilical hernia    Weakness    Wheezing     Family History  Problem Relation Age of Onset   Hypertension Mother    Cancer Father        lung   Colon cancer Neg Hx     Past Surgical History:  Procedure Laterality Date   BIOPSY  10/18/2014   Procedure: BIOPSY (Gastric);  Surgeon: West Bali, MD;  Location: [facility];  Service: Endoscopy;;   CARPAL TUNNEL RELEASE     R hand   COLONOSCOPY WITH PROPOFOL N/A 10/18/2014   MVH:QIONGEXB size external hemorrhoids/left colon is redundant   ESOPHAGOGASTRODUODENOSCOPY (EGD) WITH PROPOFOL N/A 10/18/2014   SLF: epigastric pain due to moderate NSAID gastritis   FEMUR SURGERY     left   HERNIA REPAIR  04/07/12   umb hernia repair   INSERTION OF MESH N/A 04/07/2012   Procedure: INSERTION OF MESH;  Surgeon: Lodema Pilot, DO;  Location: [facility];  Service: General;  Laterality: N/A;   KNEE ARTHROSCOPY     right   MINOR CARPAL TUNNEL Left 01/31/2016   Procedure: MINOR CARPAL TUNNEL RELEASE LEFT;  Surgeon: Dairl Ponder, MD;  Location: [facility];  Service: Orthopedics;  Laterality: Left;   Local   TOE SURGERY     dislocated lt foot   UMBILICAL HERNIA REPAIR N/A 04/07/2012   Procedure: HERNIA REPAIR UMBILICAL ADULT;  Surgeon: Lodema Pilot, DO;  Location: [facility];  Service: General;  Laterality: N/A;  open umbilical hernia repair with mesh   Social History   Occupational History   Occupation: unemployed  Tobacco Use   Smoking status: Every Day    Current  packs/day: 0.25    Average packs/day: 0.3 packs/day for 43.0 years (10.8 ttl pk-yrs)    Types: Cigarettes   Smokeless tobacco: Never   Tobacco comments:    occas still smokes when he drinks  Vaping Use   Vaping status: Never Used  Substance and Sexual Activity   Alcohol use: Yes    Comment: once weekly   Drug use: Not Currently    Types: Cocaine, "Crack" cocaine    Comment: smoked crack   Sexual activity: Not Currently    Comment: "when I drink"

## 2022-10-30 ENCOUNTER — Other Ambulatory Visit: Payer: Self-pay

## 2022-11-01 ENCOUNTER — Ambulatory Visit: Payer: Managed Care, Other (non HMO)

## 2022-11-04 ENCOUNTER — Other Ambulatory Visit: Payer: Self-pay

## 2022-11-05 ENCOUNTER — Other Ambulatory Visit: Payer: Self-pay

## 2022-11-05 ENCOUNTER — Other Ambulatory Visit (HOSPITAL_COMMUNITY): Payer: Self-pay

## 2022-11-08 ENCOUNTER — Other Ambulatory Visit: Payer: Self-pay

## 2022-11-08 ENCOUNTER — Ambulatory Visit: Payer: Managed Care, Other (non HMO) | Attending: Internal Medicine

## 2022-11-08 DIAGNOSIS — Z23 Encounter for immunization: Secondary | ICD-10-CM

## 2022-11-08 NOTE — Progress Notes (Signed)
Flu vaccine administered in right deltoid per protocols.  Information sheet given. Patient denies and pain or discomfort at injection site. Tolerated injection well no reaction.

## 2022-11-12 ENCOUNTER — Other Ambulatory Visit: Payer: Self-pay

## 2022-11-15 ENCOUNTER — Other Ambulatory Visit: Payer: Self-pay

## 2022-11-20 NOTE — Patient Instructions (Signed)
   Your procedure is scheduled on: 11/25/2022  Report to [facility] Main Entrance at 8:00     AM.  Call this number if you have problems the morning of surgery: [phone]   Remember:              Follow Directions on the letter you received from Your Physician's office regarding the Bowel Prep              No Smoking the day of Procedure :   Take these medicines the morning of surgery with A SIP OF WATER: Omeprazole, Flomax, and meloxicam if needed   Do not wear jewelry, make-up or nail polish.    Do not bring valuables to the hospital.  Contacts, dentures or bridgework may not be worn into surgery.  .   Patients discharged the day of surgery will not be allowed to drive home.     Colonoscopy, Adult, Care After This sheet gives you information about how to care for yourself after your procedure. Your health care provider may also give you more specific instructions. If you have problems or questions, contact your health care provider. What can I expect after the procedure? After the procedure, it is common to have: A small amount of blood in your stool for 24 hours after the procedure. Some gas. Mild abdominal cramping or bloating.  Follow these instructions at home: General instructions  For the first 24 hours after the procedure: Do not drive or use machinery. Do not sign important documents. Do not drink alcohol. Do your regular daily activities at a slower pace than normal. Eat soft, easy-to-digest foods. Rest often. Take over-the-counter or prescription medicines only as told by your health care provider. It is up to you to get the results of your procedure. Ask your health care provider, or the department performing the procedure, when your results will be ready. Relieving cramping and bloating Try walking around when you have cramps or feel bloated. Apply heat to your abdomen as told by your health care provider. Use a heat source that your health care provider  recommends, such as a moist heat pack or a heating pad. Place a towel between your skin and the heat source. Leave the heat on for 20-30 minutes. Remove the heat if your skin turns bright red. This is especially important if you are unable to feel pain, heat, or cold. You may have a greater risk of getting burned. Eating and drinking Drink enough fluid to keep your urine clear or pale yellow. Resume your normal diet as instructed by your health care provider. Avoid heavy or fried foods that are hard to digest. Avoid drinking alcohol for as long as instructed by your health care provider. Contact a health care provider if: You have blood in your stool 2-3 days after the procedure. Get help right away if: You have more than a small spotting of blood in your stool. You pass large blood clots in your stool. Your abdomen is swollen. You have nausea or vomiting. You have a fever. You have increasing abdominal pain that is not relieved with medicine. This information is not intended to replace advice given to you by your health care provider. Make sure you discuss any questions you have with your health care provider. Document Released: 09/26/2003 Document Revised: 11/06/2015 Document Reviewed: 04/25/2015 Elsevier Interactive Patient Education  [employer].

## 2022-11-21 ENCOUNTER — Encounter (HOSPITAL_COMMUNITY)
Admission: RE | Admit: 2022-11-21 | Discharge: 2022-11-21 | Disposition: A | Discharge status: Home / Self Care | Payer: Managed Care, Other (non HMO) | Source: Ambulatory Visit | Attending: Internal Medicine | Admitting: Internal Medicine

## 2022-11-21 ENCOUNTER — Encounter (HOSPITAL_COMMUNITY): Payer: Self-pay

## 2022-11-21 VITALS — BP 129/78 | HR 81 | Temp 97.7°F | Resp 18 | Ht 68.0 in | Wt 242.1 lb

## 2022-11-21 DIAGNOSIS — Z01812 Encounter for preprocedural laboratory examination: Secondary | ICD-10-CM | POA: Diagnosis not present

## 2022-11-21 DIAGNOSIS — Z01818 Encounter for other preprocedural examination: Secondary | ICD-10-CM

## 2022-11-21 LAB — RAPID URINE DRUG SCREEN, HOSP PERFORMED
Amphetamines: NOT DETECTED
Barbiturates: NOT DETECTED
Benzodiazepines: NOT DETECTED
Cocaine: NOT DETECTED
Opiates: NOT DETECTED
Tetrahydrocannabinol: NOT DETECTED

## 2022-11-25 ENCOUNTER — Telehealth: Payer: Self-pay | Admitting: *Deleted

## 2022-11-25 ENCOUNTER — Ambulatory Visit (HOSPITAL_COMMUNITY): Admission: RE | Admit: 2022-11-25 | Payer: Managed Care, Other (non HMO) | Source: Home / Self Care

## 2022-11-25 ENCOUNTER — Encounter (HOSPITAL_COMMUNITY): Admission: RE | Payer: Self-pay | Source: Home / Self Care

## 2022-11-25 SURGERY — COLONOSCOPY WITH PROPOFOL
Anesthesia: Monitor Anesthesia Care

## 2022-11-25 NOTE — Telephone Encounter (Signed)
Pt cancelled procedure this morning with Dr. Marletta Lor. He did not pick up prep.  He will need OV prior to rescheduling. Please schedule thanks!

## 2022-11-29 ENCOUNTER — Other Ambulatory Visit (HOSPITAL_COMMUNITY): Payer: Self-pay

## 2022-12-02 ENCOUNTER — Other Ambulatory Visit: Payer: Self-pay

## 2022-12-02 ENCOUNTER — Encounter (HOSPITAL_COMMUNITY): Payer: Self-pay

## 2022-12-02 ENCOUNTER — Ambulatory Visit (HOSPITAL_COMMUNITY)
Admission: EM | Admit: 2022-12-02 | Discharge: 2022-12-02 | Disposition: A | Discharge status: Home / Self Care | Payer: Managed Care, Other (non HMO) | Attending: Family Medicine | Admitting: Family Medicine

## 2022-12-02 DIAGNOSIS — M545 Low back pain, unspecified: Secondary | ICD-10-CM

## 2022-12-02 LAB — POCT URINALYSIS DIP (MANUAL ENTRY)
Bilirubin, UA: NEGATIVE
Blood, UA: NEGATIVE
Glucose, UA: NEGATIVE mg/dL
Ketones, POC UA: NEGATIVE mg/dL
Leukocytes, UA: NEGATIVE
Nitrite, UA: NEGATIVE
Protein Ur, POC: NEGATIVE mg/dL
Spec Grav, UA: 1.025 (ref 1.010–1.025)
Urobilinogen, UA: 0.2 U/dL
pH, UA: 5.5 (ref 5.0–8.0)

## 2022-12-02 MED ORDER — METHOCARBAMOL 500 MG PO TABS
1000.0000 mg | ORAL_TABLET | Freq: Two times a day (BID) | ORAL | 0 refills | Status: DC | PRN
  Filled 2022-12-02: qty 20, 5d supply, fill #0

## 2022-12-02 MED ORDER — NAPROXEN 375 MG PO TABS
375.0000 mg | ORAL_TABLET | Freq: Two times a day (BID) | ORAL | 0 refills | Status: DC
  Filled 2022-12-02: qty 14, 7d supply, fill #0

## 2022-12-02 NOTE — ED Triage Notes (Signed)
Patient here today with c/o LB pain since Saturday. No known injury. Increased pain with going from sitting to standing. Patient also states that he has noticed a strong odor with his urine.

## 2022-12-02 NOTE — ED Provider Notes (Signed)
[facility]   [id number] 12/02/22 Arrival Time: 8841  ASSESSMENT & PLAN:  1. Acute bilateral low back pain without sciatica    Able to ambulate here and hemodynamically stable. No indication for imaging of back at this time given no trauma and normal neurological exam. Discussed.  Trial of: Meds ordered this encounter  Medications   methocarbamol (ROBAXIN) 500 MG tablet    Sig: Take 2 tablets (1,000 mg total) by mouth 2 (two) times daily as needed for muscle spasms.    Dispense:  20 tablet    Refill:  0   naproxen (NAPROSYN) 375 MG tablet    Sig: Take 1 tablet (375 mg total) by mouth 2 (two) times daily with a meal.    Dispense:  14 tablet    Refill:  0   Work/school excuse note: provided. Medication sedation precautions given. Encourage ROM/movement as tolerated.  Discussed that if he sees a rash develop, this could be shingles.  Recommend:  Follow-up Information     Schedule an appointment as soon as possible for a visit  with Marcine Matar, MD.   Specialty: Internal Medicine Why: For follow up. Contact information: [address] [place] [address] [phone]         [facility] Emergency Department at [facility].   Specialty: Emergency Medicine Why: If symptoms worsen in any way. Contact information: [address] [place] [address] [phone]                Reviewed expectations re: course of current medical issues. Questions answered. Outlined signs and symptoms indicating need for more acute intervention. Patient verbalized understanding. After Visit Summary given.   SUBJECTIVE: History from: patient.  Samuel Irwin is a 62 y.o. male who presents with complaint of fairly persistent bilateral (L>R)lower back discomfort. Onset gradual. First noted today while at work. "Basically sitting in a recliner all weekend". Questions relation. Injury/trama: denies. History of back  problems requiring medical care: rare. Pain described as aching and without radiation. Hurts with most any movement; better if still. Aggravating factors: certain movements and prolonged walking/standing. Alleviating factors: have not been identified. Progressive LE weakness or saddle anesthesia: denies. Extremity sensation changes or weakness: denies. Ambulatory without difficulty. Normal bowel/bladder habits: yes; without urinary retention. Normal PO intake without n/v. No associated abdominal pain/n/v. No tx PTA. Also mentions that his urine sometimes has a strong odor; long-standing. Denies dysuria/urinary frequency. Denies CP.   OBJECTIVE:  Vitals:   12/02/22 0922  BP: (!) 145/89  Pulse: 63  Resp: 16  Temp: 98.3 F (36.8 C)  TempSrc: Oral  SpO2: 94%  Weight: 108.9 kg  Height: 5\' 8"  (1.727 m)    General appearance: alert; no distress HEENT: [place]; AT Neck: supple with FROM CV: regular Lungs: unlabored respirations; speaks full sentences without difficulty Abdomen: soft, non-tender; non-distended Back: moderate and poorly localized tenderness to palpation over bilateral lumbar musculature that extends to L flank also ; FROM at waist; bruising: none; without midline tenderness Extremities: without edema; symmetrical without gross deformities; normal ROM of bilateral LE Skin: warm and dry; no rashes over back Neurologic: normal gait; normal sensation and strength of bilateral LE Psychological: alert and cooperative; normal mood and affect  Labs: Results for orders placed or performed during the hospital encounter of 12/02/22  POC urinalysis dipstick  Result Value Ref Range   Color, UA yellow yellow   Clarity, UA clear clear   Glucose, UA  negative negative mg/dL   Bilirubin, UA negative negative   Ketones, POC UA negative negative mg/dL   Spec Grav, UA 4.540 9.811 - 1.025   Blood, UA negative negative   pH, UA 5.5 5.0 - 8.0   Protein Ur, POC negative negative mg/dL    Urobilinogen, UA 0.2 0.2 or 1.0 E.U./dL   Nitrite, UA Negative Negative   Leukocytes, UA Negative Negative   Labs Reviewed  POCT URINALYSIS DIP (MANUAL ENTRY)    Imaging: No results found.  No Known Allergies  Past Medical History:  Diagnosis Date   Constipation    Cough    Generalized headaches    GERD (gastroesophageal reflux disease)    H/O hiatal hernia    Heart murmur    Herpes    Hypertension    Pre-diabetes    Sleep apnea    uses BIPAP   Umbilical hernia    Weakness    Wheezing    Social History   Socioeconomic History   Marital status: Single    Spouse name: Not on file   Number of children: Not on file   Years of education: Not on file   Highest education level: Associate degree: occupational, Scientist, product/process development, or vocational program  Occupational History   Occupation: unemployed  Tobacco Use   Smoking status: Every Day    Current packs/day: 0.25    Average packs/day: 0.3 packs/day for 43.0 years (10.8 ttl pk-yrs)    Types: Cigarettes   Smokeless tobacco: Never   Tobacco comments:    occas still smokes when he drinks  Vaping Use   Vaping status: Never Used  Substance and Sexual Activity   Alcohol use: Yes    Alcohol/week: 4.0 standard drinks of alcohol    Types: 4 Cans of beer per week    Comment: once weekly   Drug use: Not Currently    Types: Cocaine, "Crack" cocaine    Comment: smoked crack   Sexual activity: Not Currently    Comment: "when I drink"  Other Topics Concern   Not on file  Social History Narrative   Not on file   Social Determinants of Health   Financial Resource Strain: Medium Risk (10/14/2022)   Overall Financial Resource Strain (CARDIA)    Difficulty of Paying Living Expenses: Somewhat hard  Food Insecurity: Food Insecurity Present (10/14/2022)   Hunger Vital Sign    Worried About Running Out of Food in the Last Year: Sometimes true    Ran Out of Food in the Last Year: Sometimes true  Transportation Needs: No Transportation  Needs (10/14/2022)   PRAPARE - Administrator, Civil Service (Medical): No    Lack of Transportation (Non-Medical): No  Physical Activity: Not on file  Stress: Not on file  Social Connections: Unknown (10/14/2022)   Social Connection and Isolation Panel [NHANES]    Frequency of Communication with Friends and Family: Once a week    Frequency of Social Gatherings with Friends and Family: Patient declined    Attends Religious Services: 1 to 4 times per year    Active Member of [employer] or Organizations: No    Attends Engineer, structural: Not on file    Marital Status: Never married  Catering manager Violence: Not on file   Family History  Problem Relation Age of Onset   Hypertension Mother    Cancer Father        lung   Colon cancer Neg Hx    Past Surgical History:  Procedure Laterality Date   BIOPSY  10/18/2014   Procedure: BIOPSY (Gastric);  Surgeon: West Bali, MD;  Location: [facility];  Service: Endoscopy;;   CARPAL TUNNEL RELEASE     R hand   COLONOSCOPY WITH PROPOFOL N/A 10/18/2014   [id number] size external hemorrhoids/left colon is redundant   ESOPHAGOGASTRODUODENOSCOPY (EGD) WITH PROPOFOL N/A 10/18/2014   SLF: epigastric pain due to moderate NSAID gastritis   FEMUR SURGERY     left   HERNIA REPAIR  04/07/12   umb hernia repair   INSERTION OF MESH N/A 04/07/2012   Procedure: INSERTION OF MESH;  Surgeon: Lodema Pilot, DO;  Location: [facility];  Service: General;  Laterality: N/A;   KNEE ARTHROSCOPY     right   MINOR CARPAL TUNNEL Left 01/31/2016   Procedure: MINOR CARPAL TUNNEL RELEASE LEFT;  Surgeon: Dairl Ponder, MD;  Location: [facility];  Service: Orthopedics;  Laterality: Left;  Local   TOE SURGERY     dislocated lt foot   UMBILICAL HERNIA REPAIR N/A 04/07/2012   Procedure: HERNIA REPAIR UMBILICAL ADULT;  Surgeon: Lodema Pilot, DO;  Location: [facility];  Service: General;  Laterality: N/A;  open umbilical hernia repair with mesh       Mardella Layman, MD 12/02/22 1109

## 2022-12-04 NOTE — Telephone Encounter (Signed)
Patient had left a message asking that we cancel the 10/17 appt and to rescehdule his colonoscopy.  I called him back and left him a message saying that the appt was cancelled but the scehduler had sent me a note that since he didn't have colonoscopy previously and didn't pick up his prep he would need an OV before they rescheduled.

## 2022-12-08 ENCOUNTER — Other Ambulatory Visit: Payer: Self-pay | Admitting: Internal Medicine

## 2022-12-08 DIAGNOSIS — I10 Essential (primary) hypertension: Secondary | ICD-10-CM

## 2022-12-09 ENCOUNTER — Other Ambulatory Visit: Payer: Self-pay

## 2022-12-09 MED ORDER — HYDROCHLOROTHIAZIDE 25 MG PO TABS
25.0000 mg | ORAL_TABLET | Freq: Every day | ORAL | 1 refills | Status: DC
Start: 2022-12-09 — End: 2023-06-13
  Filled 2022-12-09: qty 30, 30d supply, fill #0
  Filled 2023-01-10: qty 30, 30d supply, fill #1
  Filled 2023-02-10: qty 30, 30d supply, fill #2
  Filled 2023-03-21: qty 30, 30d supply, fill #3
  Filled 2023-04-15: qty 30, 30d supply, fill #4
  Filled 2023-05-14: qty 30, 30d supply, fill #5

## 2022-12-09 NOTE — Telephone Encounter (Signed)
Requested Prescriptions  Pending Prescriptions Disp Refills   hydrochlorothiazide (HYDRODIURIL) 25 MG tablet 90 tablet 1    Sig: Take 1 tablet (25 mg total) by mouth daily.     Cardiovascular: Diuretics - Thiazide Failed - 12/08/2022  2:15 PM      Failed - Last BP in normal range    BP Readings from Last 1 Encounters:  12/02/22 (!) 145/89         Passed - Cr in normal range and within 180 days    Creat  Date Value Ref Range Status  09/19/2015 1.09 0.70 - 1.33 mg/dL Final    Comment:      For patients > or = 60 years of age: The upper reference limit for Creatinine is approximately 13% higher for people identified as African-American.      Creatinine, Ser  Date Value Ref Range Status  10/18/2022 1.00 0.76 - 1.27 mg/dL Final   Creatinine, Urine  Date Value Ref Range Status  04/30/2014 397.06 mg/dL Final         Passed - K in normal range and within 180 days    Potassium  Date Value Ref Range Status  10/18/2022 3.8 3.5 - 5.2 mmol/L Final         Passed - Na in normal range and within 180 days    Sodium  Date Value Ref Range Status  10/18/2022 138 134 - 144 mmol/L Final         Passed - Valid encounter within last 6 months    Recent Outpatient Visits           1 month ago Annual physical exam   [place] [facility] Marcine Matar, MD   7 months ago Essential hypertension   [place] [name] Marcine Matar, MD   11 months ago Essential hypertension   [place] [facility] Marcine Matar, MD   1 year ago OSA treated with BiPAP   [place] [facility] Marcine Matar, MD   1 year ago Essential hypertension   [place] [facility] Marcine Matar, MD       Future Appointments             In 2 months Laural Benes, Binnie Rail, MD [facility]

## 2022-12-09 NOTE — Telephone Encounter (Signed)
Correct he has had 2 reschedules for procedures already. He will need OV prior to rescheduling it has also been July since his OV and procedures are booking into November now.

## 2022-12-11 ENCOUNTER — Other Ambulatory Visit (HOSPITAL_COMMUNITY): Payer: Self-pay

## 2022-12-12 ENCOUNTER — Ambulatory Visit: Payer: Managed Care, Other (non HMO) | Admitting: Gastroenterology

## 2022-12-17 ENCOUNTER — Other Ambulatory Visit: Payer: Self-pay

## 2022-12-19 NOTE — Plan of Care (Signed)
[facility] Tonsillectomy/Adenoidectomy, Postoperative PEDS care plan entered in error.

## 2023-01-09 ENCOUNTER — Ambulatory Visit (INDEPENDENT_AMBULATORY_CARE_PROVIDER_SITE_OTHER): Payer: Managed Care, Other (non HMO) | Admitting: Gastroenterology

## 2023-01-09 ENCOUNTER — Encounter: Payer: Self-pay | Admitting: Gastroenterology

## 2023-01-09 VITALS — BP 127/79 | HR 74 | Temp 97.7°F | Ht 68.0 in | Wt 246.6 lb

## 2023-01-09 DIAGNOSIS — R151 Fecal smearing: Secondary | ICD-10-CM

## 2023-01-09 DIAGNOSIS — K594 Anal spasm: Secondary | ICD-10-CM

## 2023-01-09 NOTE — Patient Instructions (Addendum)
Resume metamucil. Do 3 teaspoons or 1 tablespoon daily in 8 oz water. Increase to twice daily if needed. Can do capsules in afternoon/before bed.     If after several weeks you are still having smearing please start miralax 17g once daily.   We will reschedule your colonoscopy in the near future Dr. Marletta Lor.  Follow-up in 3 months  It was a pleasure to see you today. I want to create trusting relationships with patients. If you receive a survey regarding your visit,  I greatly appreciate you taking time to fill this out on paper or through your MyChart. I value your feedback.  Brooke Bonito, MSN, FNP-BC, AGACNP-BC [facility]

## 2023-01-09 NOTE — Progress Notes (Signed)
GI Office Note    Referring Provider: Marcine Matar, MD Primary Care Physician:  Samuel Matar, MD Primary Gastroenterologist: Samuel Duos. Marletta Lor, DO  Date:  01/09/2023  ID:  Samuel Irwin, DOB 27-Sep-1960, MRN [id number]   Chief Complaint   Chief Complaint  Patient presents with   Colonoscopy    Colonoscopy screening    History of Present Illness  Samuel Irwin is a 62 y.o. male with a history of constipation, rectal pain,GERD, and HTN presenting today for follow up and evaluation prior to rescheduling colonoscopy.  EGD 10/18/14: -Moderate worsening gastritis s/p biopsy -Normal duodenum -Advised omeprazole once daily.   Colonoscopy 10/18/14: -moderate internal and external hemorrhoids -redundant left colon -Denies use Preparation H 2-4 times daily for rectal pressure/bleeding and itching.  Also advised high-fiber diet. -repeat in 10 years   Initial office visit 05/23/22.  Reported he eats a fair amount of vegetables and fruits and transitions to keep Samuel Irwin, but reported leakage and seepage in his underwear with wiping times.  States he has streaks in his underwear when he attempted to set up for bends over at the gym.  Currently not being very active given concern over bowel movements and rectal pain.  Continues to have pain that is described as 8 out of 10 and last for a while.  Happened about a month prior to office visit and it occurred in the gym.  Also with some mild rectal bleeding.  Pain usually occurs for a few minutes at a time.  Has happened 3 times in the last 12 months.  Rectal exam completed without any external hemorrhoids.  No rectal masses good rectal tone identified.  Possible evidence of small hemorrhoids on DRE.  Vies to start Benefiber or Metamucil 2 to 3 teaspoons daily continue high-fiber diet.  Consider further evaluation with rectal manometry to assess rectal compliance.  Last office visit 09/05/22.  Having intermittent rectal pain usually  lasting about 4 minutes at a time, sometimes does not occur with any movement in all he can be sitting.  Denied any rectal bleeding.  He was unsure if turmeric was causing headaches or if it was consumption of pork as his blood pressure had been elevated.  Due to see cardiology soon.  Still having some fecal smearing every day and taking generic Metamucil, sometimes will take it twice daily.  Advised continue fiber supplementation and scheduled for colonoscopy.  Advised we could consider topical diltiazem if he continues to have recurrent rectal pain.  Provided hemorrhoid banding pamphlet.  Today:  Went swimming last night and going back to the gym. Has been having back pain. Has been working long hours and overtime.   Did not have his prep and stuff when he looked last minute. He had eaten solid food and all as well the day before.   Denies lack of appetite, early satiety, abdominal pain, nausea, vomiting, dysphagia, melena, BRBPR, weight loss.  Still taking metamucil as needed. Has not been taking it everyday. Has been trying to not strain. Still has some smearing and states it happens more than it needs to be, especially when active in the gym. It happens less when he takes the metamucil.   Was using castor oil previously but that would clean him out. Eats a lot of fruits and does like to eat meat.    Wt Readings from Last 3 Encounters:  01/09/23 246 lb 9.6 oz (111.9 kg)  12/02/22 240 lb (108.9 kg)  11/21/22 242 lb  1 oz (109.8 kg)    Current Outpatient Medications  Medication Sig Dispense Refill   acyclovir (ZOVIRAX) 400 MG tablet Take 1 tablet (400 mg total) by mouth 2 (two) times daily. 180 tablet 1   atorvastatin (LIPITOR) 20 MG tablet Take 1 tablet (20 mg total) by mouth daily. 30 tablet 6   hydrochlorothiazide (HYDRODIURIL) 25 MG tablet Take 1 tablet (25 mg total) by mouth daily. 90 tablet 1   naproxen (NAPROSYN) 375 MG tablet Take 1 tablet (375 mg total) by mouth 2 (two) times  daily with a meal. 14 tablet 0   omeprazole (PRILOSEC) 20 MG capsule Take by mouth as needed (heartburn).     tadalafil (CIALIS) 10 MG tablet Take 10 mg by mouth daily as needed for erectile dysfunction.     tadalafil (CIALIS) 20 MG tablet Take 20 mg by mouth daily as needed for erectile dysfunction.     tamsulosin (FLOMAX) 0.4 MG CAPS capsule Take 1 capsule (0.4 mg total) by mouth daily. 30 capsule 6   azelastine (ASTELIN) 0.1 % nasal spray PLACE 2 SPRAYS INTO BOTH NOSTRILS 2 (TWO) TIMES DAILY. USE IN EACH NOSTRIL AS DIRECTED (Patient not taking: Reported on 01/09/2023) 30 mL 0   fluticasone (FLONASE) 50 MCG/ACT nasal spray PLACE 2 SPRAYS INTO BOTH NOSTRILS DAILY. (Patient not taking: Reported on 01/09/2023) 16 g 2   methocarbamol (ROBAXIN) 500 MG tablet Take 2 tablets (1,000 mg total) by mouth 2 (two) times daily as needed for muscle spasms. (Patient not taking: Reported on 01/09/2023) 20 tablet 0   No current facility-administered medications for this visit.    Past Medical History:  Diagnosis Date   Constipation    Cough    Generalized headaches    GERD (gastroesophageal reflux disease)    H/O hiatal hernia    Heart murmur    Herpes    Hypertension    Pre-diabetes    Sleep apnea    uses BIPAP   Umbilical hernia    Weakness    Wheezing     Past Surgical History:  Procedure Laterality Date   BIOPSY  10/18/2014   Procedure: BIOPSY (Gastric);  Surgeon: West Bali, MD;  Location: [facility];  Service: Endoscopy;;   CARPAL TUNNEL RELEASE     R hand   COLONOSCOPY WITH PROPOFOL N/A 10/18/2014   [id number] size external hemorrhoids/left colon is redundant   ESOPHAGOGASTRODUODENOSCOPY (EGD) WITH PROPOFOL N/A 10/18/2014   SLF: epigastric pain due to moderate NSAID gastritis   FEMUR SURGERY     left   HERNIA REPAIR  04/07/12   umb hernia repair   INSERTION OF MESH N/A 04/07/2012   Procedure: INSERTION OF MESH;  Surgeon: Lodema Pilot, DO;  Location: [facility];  Service: General;   Laterality: N/A;   KNEE ARTHROSCOPY     right   MINOR CARPAL TUNNEL Left 01/31/2016   Procedure: MINOR CARPAL TUNNEL RELEASE LEFT;  Surgeon: Dairl Ponder, MD;  Location: [facility];  Service: Orthopedics;  Laterality: Left;  Local   TOE SURGERY     dislocated lt foot   UMBILICAL HERNIA REPAIR N/A 04/07/2012   Procedure: HERNIA REPAIR UMBILICAL ADULT;  Surgeon: Lodema Pilot, DO;  Location: [facility];  Service: General;  Laterality: N/A;  open umbilical hernia repair with mesh    Family History  Problem Relation Age of Onset   Hypertension Mother    Cancer Father        lung   Colon cancer Neg Hx  Allergies as of 01/09/2023   (No Known Allergies)    Social History   Socioeconomic History   Marital status: Single    Spouse name: Not on file   Number of children: Not on file   Years of education: Not on file   Highest education level: Associate degree: occupational, Scientist, product/process development, or vocational program  Occupational History   Occupation: unemployed  Tobacco Use   Smoking status: Every Day    Current packs/day: 0.25    Average packs/day: 0.3 packs/day for 43.0 years (10.8 ttl pk-yrs)    Types: Cigarettes   Smokeless tobacco: Never   Tobacco comments:    occas still smokes when he drinks  Vaping Use   Vaping status: Never Used  Substance and Sexual Activity   Alcohol use: Yes    Alcohol/week: 4.0 standard drinks of alcohol    Types: 4 Cans of beer per week    Comment: once weekly   Drug use: Not Currently    Types: Cocaine, "Crack" cocaine    Comment: smoked crack   Sexual activity: Not Currently    Comment: "when I drink"  Other Topics Concern   Not on file  Social History Narrative   Not on file   Social Determinants of Health   Financial Resource Strain: Medium Risk (10/14/2022)   Overall Financial Resource Strain (CARDIA)    Difficulty of Paying Living Expenses: Somewhat hard  Food Insecurity: Food Insecurity Present (10/14/2022)   Hunger Vital  Sign    Worried About Running Out of Food in the Last Year: Sometimes true    Ran Out of Food in the Last Year: Sometimes true  Transportation Needs: No Transportation Needs (10/14/2022)   PRAPARE - Administrator, Civil Service (Medical): No    Lack of Transportation (Non-Medical): No  Physical Activity: Not on file  Stress: Not on file  Social Connections: Unknown (10/14/2022)   Social Connection and Isolation Panel [NHANES]    Frequency of Communication with Friends and Family: Once a week    Frequency of Social Gatherings with Friends and Family: Patient declined    Attends Religious Services: 1 to 4 times per year    Active Member of [employer] or Organizations: No    Attends Engineer, structural: Not on file    Marital Status: Never married     Review of Systems   Gen: Denies fever, chills, anorexia. Denies fatigue, weakness, weight loss.  CV: Denies chest pain, palpitations, syncope, peripheral edema, and claudication. Resp: Denies dyspnea at rest, cough, wheezing, coughing up blood, and pleurisy. GI: See HPI Derm: Denies rash, itching, dry skin Psych: Denies depression, anxiety, memory loss, confusion. No homicidal or suicidal ideation.  Heme: Denies bruising, bleeding, and enlarged lymph nodes.  Physical Exam   BP 127/79 (BP Location: Left Arm, Patient Position: Sitting, Cuff Size: Large)   Pulse 74   Temp 97.7 F (36.5 C) (Temporal)   Ht 5\' 8"  (1.727 m)   Wt 246 lb 9.6 oz (111.9 kg)   BMI 37.50 kg/m   General:   Alert and oriented. No distress noted. Pleasant and cooperative.  Head:  Normocephalic and atraumatic. Eyes:  Conjuctiva clear without scleral icterus. Mouth:  Oral mucosa pink and moist. Good dentition. No lesions. Lungs:  Clear to auscultation bilaterally. No wheezes, rales, or rhonchi. No distress.  Heart:  S1, S2 present without murmurs appreciated.  Abdomen:  +BS, soft, non-tender and non-distended. No rebound or guarding. No HSM or  masses  noted. Rectal: deferred Msk:  Symmetrical without gross deformities. Normal posture. Extremities:  Without edema. Neurologic:  Alert and  oriented x4 Psych:  Alert and cooperative. Normal mood and affect.  Assessment  IROH JESCHKE is a 62 y.o. male with a history of constipation, rectal pain,GERD, and HTN presenting today for follow up and evaluation prior to rescheduling colonoscopy.  Proctalgia, fecal smearing: Currently without any rectal bleeding.  No longer having any significant rectal pain however continues to have fecal smearing.  Previously was having fairly frequent rectal pain/spasm.  Fecal smearing does seem to be improved somewhat with fiber supplementation.  Continues to have to wipe frequently and need to carry around change of underwear.  Previously scheduled for colonoscopy which unfortunately was rescheduled multiple times, will go ahead and reschedule again today.  Discussed that lax and rectal tissue and/or hemorrhoids could be contributing to fecal smearing as well as some pelvic floor dysfunction.  We discussed awaiting findings of colonoscopy and the presence of hemorrhoids going to consider hemorrhoid banding for treatment, provided pamphlet again today and discussed procedure in detail with patient.  Also advised MiraLAX 17 g once daily to avoid the need for any potential straining.  PLAN   Metamucil 1-2 times daily.  Miralax 17g once daily.  Continue omeprazole 20 mg as needed Proceed with colonoscopy with propofol by Dr. Marletta Irwin in near future: the risks, benefits, and alternatives have been discussed with the patient in detail. The patient states understanding and desires to proceed. ASA 2 BMP Provided hemorrhoid banding pamphlet. Follow up in 3 months.    Brooke Bonito, MSN, FNP-BC, AGACNP-BC [facility]

## 2023-01-10 ENCOUNTER — Encounter: Payer: Self-pay | Admitting: *Deleted

## 2023-01-10 ENCOUNTER — Other Ambulatory Visit: Payer: Self-pay

## 2023-01-10 ENCOUNTER — Other Ambulatory Visit: Payer: Self-pay | Admitting: *Deleted

## 2023-01-10 DIAGNOSIS — R151 Fecal smearing: Secondary | ICD-10-CM

## 2023-01-10 DIAGNOSIS — K6289 Other specified diseases of anus and rectum: Secondary | ICD-10-CM

## 2023-01-10 MED ORDER — PEG 3350-KCL-NA BICARB-NACL 420 G PO SOLR
4000.0000 mL | Freq: Once | ORAL | 0 refills | Status: AC
  Filled 2023-01-10: qty 4000, 1d supply, fill #0

## 2023-01-10 NOTE — Addendum Note (Signed)
Addended by: Elinor Dodge on: 01/10/2023 09:13 AM   Modules accepted: Orders

## 2023-01-13 ENCOUNTER — Encounter: Payer: Self-pay | Admitting: *Deleted

## 2023-01-13 ENCOUNTER — Other Ambulatory Visit: Payer: Self-pay

## 2023-02-10 ENCOUNTER — Other Ambulatory Visit: Payer: Self-pay | Admitting: Internal Medicine

## 2023-02-11 NOTE — Patient Instructions (Signed)
Samuel Irwin  02/11/2023     @PREFPERIOPPHARMACY @   Your procedure is scheduled on 02/14/23.  Report to [facility] at 1230 P.M.  Call this number if you have problems the morning of surgery:  [phone]  If you experience any cold or flu symptoms such as cough, fever, chills, shortness of breath, etc. between now and your scheduled surgery, please notify us at the above number.   Remember:  Do not eat or drink after midnight.  You may drink clear liquids until 1030 .  Clear liquids allowed are:                    Water, Juice (No red color; non-citric and without pulp; diabetics please choose diet or no sugar options), Carbonated beverages (diabetics please choose diet or no sugar options), Clear Tea (No creamer, milk, or cream, including half & half and powdered creamer), Black Coffee Only (No creamer, milk or cream, including half & half and powdered creamer), and Clear Sports drink (No red color; diabetics please choose diet or no sugar options)    Take these medicines the morning of surgery with A SIP OF WATER PRILOSEC & FLOMAX   FOLLOW DIET & PREP INSTRUCTIONS PROVIDED TO YOU FROM DOCTOR'S OFFICE    Do not wear jewelry, make-up or nail polish, including gel polish,  artificial nails, or any other type of covering on natural nails (fingers and  toes).  Do not wear lotions, powders, or perfumes, or deodorant.  Do not shave 48 hours prior to surgery.  Men may shave face and neck.  Do not bring valuables to the hospital.  [facility] is not responsible for any belongings or valuables.  Contacts, dentures or bridgework may not be worn into surgery.  Leave your suitcase in the car.  After surgery it may be brought to your room.  For patients admitted to the hospital, discharge time will be determined by your treatment team.  Patients discharged the day of surgery will not be allowed to drive home.   Name and phone number of your driver:   FAMILY Special instructions:   FOLLOW DIET & PREP INSTRUCTIONS PROVIDED TO YOU FROM DOCTOR'S OFFICE  NO SMOKING OR VAPING 24 HOURS PRIOR TO PROCEDURE  Please read over the following fact sheets that you were given. Anesthesia Post-op Instructions and Care and Recovery After Surgery      PATIENT INSTRUCTIONS POST-ANESTHESIA  IMMEDIATELY FOLLOWING SURGERY:  Do not drive or operate machinery for the first twenty four hours after surgery.  Do not make any important decisions for twenty four hours after surgery or while taking narcotic pain medications or sedatives.  If you develop intractable nausea and vomiting or a severe headache please notify your doctor immediately.  FOLLOW-UP:  Please make an appointment with your surgeon as instructed. You do not need to follow up with anesthesia unless specifically instructed to do so.  WOUND CARE INSTRUCTIONS (if applicable):  Keep a dry clean dressing on the anesthesia/puncture wound site if there is drainage.  Once the wound has quit draining you may leave it open to air.  Generally you should leave the bandage intact for twenty four hours unless there is drainage.  If the epidural site drains for more than 36-48 hours please call the anesthesia department.  QUESTIONS?:  Please feel free to call your physician or the hospital operator if you have any questions, and they will be happy to assist you.  Colonoscopy, Adult A colonoscopy is a procedure to look at the entire large intestine. This procedure is done using a long, thin, flexible tube that has a camera on the end. You may have a colonoscopy: As a part of normal colorectal screening. If you have certain symptoms, such as: A low number of red blood cells in your blood (anemia). Diarrhea that does not go away. Pain in your abdomen. Blood in your stool. A colonoscopy can help screen for and diagnose medical problems, including: An abnormal growth of cells or tissue (tumor). Abnormal growths within the lining of your  intestine (polyps). Inflammation. Areas of bleeding. Tell your health care provider about: Any allergies you have. All medicines you are taking, including vitamins, herbs, eye drops, creams, and over-the-counter medicines. Any problems you or family members have had with anesthetic medicines. Any bleeding problems you have. Any surgeries you have had. Any medical conditions you have. Any problems you have had with having bowel movements. Whether you are pregnant or may be pregnant. What are the risks? Generally, this is a safe procedure. However, problems may occur, including: Bleeding. Damage to your intestine. Allergic reactions to medicines given during the procedure. Infection. This is rare. What happens before the procedure? Eating and drinking restrictions Follow instructions from your health care provider about eating or drinking restrictions, which may include: A few days before the procedure: Follow a low-fiber diet. Avoid nuts, seeds, dried fruit, raw fruits, and vegetables. 1-3 days before the procedure: Eat only gelatin dessert or ice pops. Drink only clear liquids, such as water, clear juice, clear broth or bouillon, black coffee or tea, or clear soft drinks or sports drinks. Avoid liquids that contain red or purple dye. The day of the procedure: Do not eat solid foods. You may continue to drink clear liquids until up to 2 hours before the procedure. Do not eat or drink anything starting 2 hours before the procedure, or within the time period that your health care provider recommends. Bowel prep If you were prescribed a bowel prep to take by mouth (orally) to clean out your colon: Take it as told by your health care provider. Starting the day before your procedure, you will need to drink a large amount of liquid medicine. The liquid will cause you to have many bowel movements of loose stool until your stool becomes almost clear or light green. If your skin or the  opening between the buttocks (anus) gets irritated from diarrhea, you may relieve the irritation using: Wipes with medicine in them, such as adult wet wipes with aloe and vitamin E. A product to soothe skin, such as petroleum jelly. If you vomit while drinking the bowel prep: Take a break for up to 60 minutes. Begin the bowel prep again. Call your health care provider if you keep vomiting or you cannot take the bowel prep without vomiting. To clean out your colon, you may also be given: Laxative medicines. These help you have a bowel movement. Instructions for enema use. An enema is liquid medicine injected into your rectum. Medicines Ask your health care provider about: Changing or stopping your regular medicines or supplements. This is especially important if you are taking iron supplements, diabetes medicines, or blood thinners. Taking medicines such as aspirin and ibuprofen. These medicines can thin your blood. Do not take these medicines unless your health care provider tells you to take them. Taking over-the-counter medicines, vitamins, herbs, and supplements. General instructions Ask your health care provider what steps will  be taken to help prevent infection. These may include washing skin with a germ-killing soap. If you will be going home right after the procedure, plan to have a responsible adult: Take you home from the hospital or clinic. You will not be allowed to drive. Care for you for the time you are told. What happens during the procedure?  An IV will be inserted into one of your veins. You will be given a medicine to make you fall asleep (general anesthetic). You will lie on your side with your knees bent. A lubricant will be put on the tube. Then the tube will be: Inserted into your anus. Gently eased through all parts of your large intestine. Air will be sent into your colon to keep it open. This may cause some pressure or cramping. Images will be taken with the  camera and will appear on a screen. A small tissue sample may be removed to be looked at under a microscope (biopsy). The tissue may be sent to a lab for testing if any signs of problems are found. If small polyps are found, they may be removed and checked for cancer cells. When the procedure is finished, the tube will be removed. The procedure may vary among health care providers and hospitals. What happens after the procedure? Your blood pressure, heart rate, breathing rate, and blood oxygen level will be monitored until you leave the hospital or clinic. You may have a small amount of blood in your stool. You may pass gas and have mild cramping or bloating in your abdomen. This is caused by the air that was used to open your colon during the exam. If you were given a sedative during the procedure, it can affect you for several hours. Do not drive or operate machinery until your health care provider says that it is safe. It is up to you to get the results of your procedure. Ask your health care provider, or the department that is doing the procedure, when your results will be ready. Summary A colonoscopy is a procedure to look at the entire large intestine. Follow instructions from your health care provider about eating and drinking before the procedure. If you were prescribed an oral bowel prep to clean out your colon, take it as told by your health care provider. During the colonoscopy, a flexible tube with a camera on its end is inserted into the anus and then passed into all parts of the large intestine. This information is not intended to replace advice given to you by your health care provider. Make sure you discuss any questions you have with your health care provider. Document Revised: 03/26/2022 Document Reviewed: 10/04/2020 Elsevier Patient Education  2024 [employer] Colonoscopy, Adult, Care After The following information offers guidance on how to care for yourself after your  procedure. Your health care provider may also give you more specific instructions. If you have problems or questions, contact your health care provider. What can I expect after the procedure? After the procedure, it is common to have: A small amount of blood in your stool for 24 hours after the procedure. Some gas. Mild cramping or bloating of your abdomen. Follow these instructions at home: Eating and drinking  Drink enough fluid to keep your urine pale yellow. Follow instructions from your health care provider about eating or drinking restrictions. Resume your normal diet as told by your health care provider. Avoid heavy or fried foods that are hard to digest. Activity Rest as told by  your health care provider. Avoid sitting for a long time without moving. Get up to take short walks every 1-2 hours. This is important to improve blood flow and breathing. Ask for help if you feel weak or unsteady. Return to your normal activities as told by your health care provider. Ask your health care provider what activities are safe for you. Managing cramping and bloating  Try walking around when you have cramps or feel bloated. If directed, apply heat to your abdomen as told by your health care provider. Use the heat source that your health care provider recommends, such as a moist heat pack or a heating pad. Place a towel between your skin and the heat source. Leave the heat on for 20-30 minutes. Remove the heat if your skin turns bright red. This is especially important if you are unable to feel pain, heat, or cold. You have a greater risk of getting burned. General instructions If you were given a sedative during the procedure, it can affect you for several hours. Do not drive or operate machinery until your health care provider says that it is safe. For the first 24 hours after the procedure: Do not sign important documents. Do not drink alcohol. Do your regular daily activities at a slower pace  than normal. Eat soft foods that are easy to digest. Take over-the-counter and prescription medicines only as told by your health care provider. Keep all follow-up visits. This is important. Contact a health care provider if: You have blood in your stool 2-3 days after the procedure. Get help right away if: You have more than a small spotting of blood in your stool. You have large blood clots in your stool. You have swelling of your abdomen. You have nausea or vomiting. You have a fever. You have increasing pain in your abdomen that is not relieved with medicine. These symptoms may be an emergency. Get help right away. Call 911. Do not wait to see if the symptoms will go away. Do not drive yourself to the hospital. Summary After the procedure, it is common to have a small amount of blood in your stool. You may also have mild cramping and bloating of your abdomen. If you were given a sedative during the procedure, it can affect you for several hours. Do not drive or operate machinery until your health care provider says that it is safe. Get help right away if you have a lot of blood in your stool, nausea or vomiting, a fever, or increased pain in your abdomen. This information is not intended to replace advice given to you by your health care provider. Make sure you discuss any questions you have with your health care provider. Document Revised: 03/26/2022 Document Reviewed: 10/04/2020 Elsevier Patient Education  2024 [employer] Monitored Anesthesia Care Anesthesia refers to the techniques, procedures, and medicines that help a person stay safe and comfortable during surgery. Monitored anesthesia care, or sedation, is one type of anesthesia. You may have sedation if you do not need to be asleep for your procedure. Procedures that use sedation may include: Surgery to remove cataracts from your eyes. A dental procedure. A biopsy. This is when a tissue sample is removed and looked at under  a microscope. You will be watched closely during your procedure. Your level of sedation or type of anesthesia may be changed to fit your needs. Tell a health care provider about: Any allergies you have. All medicines you are taking, including vitamins, herbs, eye drops, creams,  and over-the-counter medicines. Any problems you or family members have had with anesthesia. Any bleeding problems you have. Any surgeries you have had. Any medical conditions or illnesses you have. This includes sleep apnea, cough, fever, or the flu. Whether you are pregnant or may be pregnant. Whether you use cigarettes, alcohol, or drugs. Any use of steroids, whether by mouth or as a cream. What are the risks? Your health care provider will talk with you about risks. These may include: Getting too much medicine (oversedation). Nausea. Allergic reactions to medicines. Trouble breathing. If this happens, a breathing tube may be used to help you breathe. It will be removed when you are awake and breathing on your own. Heart trouble. Lung trouble. Confusion that gets better with time (emergence delirium). What happens before the procedure? When to stop eating and drinking Follow instructions from your health care provider about what you may eat and drink. These may include: 8 hours before your procedure Stop eating most foods. Do not eat meat, fried foods, or fatty foods. Eat only light foods, such as toast or crackers. All liquids are okay except energy drinks and alcohol. 6 hours before your procedure Stop eating. Drink only clear liquids, such as water, clear fruit juice, black coffee, plain tea, and sports drinks. Do not drink energy drinks or alcohol. 2 hours before your procedure Stop drinking all liquids. You may be allowed to take medicines with small sips of water. If you do not follow your health care provider's instructions, your procedure may be delayed or canceled. Medicines Ask your health  care provider about: Changing or stopping your regular medicines. These include any diabetes medicines or blood thinners you take. Taking medicines such as aspirin and ibuprofen. These medicines can thin your blood. Do not take them unless your health care provider tells you to. Taking over-the-counter medicines, vitamins, herbs, and supplements. Testing You may have an exam or testing. You may have a blood or urine sample taken. General instructions Do not use any products that contain nicotine or tobacco for at least 4 weeks before the procedure. These products include cigarettes, chewing tobacco, and vaping devices, such as e-cigarettes. If you need help quitting, ask your health care provider. If you will be going home right after the procedure, plan to have a responsible adult: Take you home from the hospital or clinic. You will not be allowed to drive. Care for you for the time you are told. What happens during the procedure?  Your blood pressure, heart rate, breathing, level of pain, and blood oxygen level will be monitored. An IV will be inserted into one of your veins. You may be given: A sedative. This helps you relax. Anesthesia. This will: Numb certain areas of your body. Make you fall asleep for surgery. You will be given medicines as needed to keep you comfortable. The more medicine you are given, the deeper your level of sedation will be. Your level of sedation may be changed to fit your needs. There are three levels of sedation: Mild sedation. At this level, you may feel awake and relaxed. You will be able to follow directions. Moderate sedation. At this level, you will be sleepy. You may not remember the procedure. Deep sedation. At this level, you will be asleep. You will not remember the procedure. How you get the medicines will depend on your age and the procedure. They may be given as: A pill. This may be taken by mouth (orally) or inserted into the rectum. An  injection. This may be into a vein or muscle. A spray through the nose. After your procedure is over, the medicine will be stopped. The procedure may vary among health care providers and hospitals. What happens after the procedure? Your blood pressure, heart rate, breathing rate, and blood oxygen level will be monitored until you leave the hospital or clinic. You may feel sleepy, clumsy, or nauseous. You may not remember what happened during or after the procedure. Sedation can affect you for several hours. Do not drive or use machinery until your health care provider says that it is safe. This information is not intended to replace advice given to you by your health care provider. Make sure you discuss any questions you have with your health care provider. Document Revised: 07/09/2021 Document Reviewed: 07/09/2021 Elsevier Patient Education  2024 [employer] Monitored Anesthesia Care, Care After The following information offers guidance on how to care for yourself after your procedure. Your health care provider may also give you more specific instructions. If you have problems or questions, contact your health care provider. What can I expect after the procedure? After the procedure, it is common to have: Tiredness. Little or no memory about what happened during or after the procedure. Impaired judgment when it comes to making decisions. Nausea or vomiting. Some trouble with balance. Follow these instructions at home: For the time period you were told by your health care provider:  Rest. Do not participate in activities where you could fall or become injured. Do not drive or use machinery. Do not drink alcohol. Do not take sleeping pills or medicines that cause drowsiness. Do not make important decisions or sign legal documents. Do not take care of children on your own. Medicines Take over-the-counter and prescription medicines only as told by your health care provider. If you  were prescribed antibiotics, take them as told by your health care provider. Do not stop using the antibiotic even if you start to feel better. Eating and drinking Follow instructions from your health care provider about what you may eat and drink. Drink enough fluid to keep your urine pale yellow. If you vomit: Drink clear fluids slowly and in small amounts as you are able. Clear fluids include water, ice chips, low-calorie sports drinks, and fruit juice that has water added to it (diluted fruit juice). Eat light and bland foods in small amounts as you are able. These foods include bananas, applesauce, rice, lean meats, toast, and crackers. General instructions  Have a responsible adult stay with you for the time you are told. It is important to have someone help care for you until you are awake and alert. If you have sleep apnea, surgery and some medicines can increase your risk for breathing problems. Follow instructions from your health care provider about wearing your sleep device: When you are sleeping. This includes during daytime naps. While taking prescription pain medicines, sleeping medicines, or medicines that make you drowsy. Do not use any products that contain nicotine or tobacco. These products include cigarettes, chewing tobacco, and vaping devices, such as e-cigarettes. If you need help quitting, ask your health care provider. Contact a health care provider if: You feel nauseous or vomit every time you eat or drink. You feel light-headed. You are still sleepy or having trouble with balance after 24 hours. You get a rash. You have a fever. You have redness or swelling around the IV site. Get help right away if: You have trouble breathing. You have new confusion after  you get home. These symptoms may be an emergency. Get help right away. Call 911. Do not wait to see if the symptoms will go away. Do not drive yourself to the hospital. This information is not intended to  replace advice given to you by your health care provider. Make sure you discuss any questions you have with your health care provider. Document Revised: 07/09/2021 Document Reviewed: 07/09/2021 Elsevier Patient Education  2024 [employer].

## 2023-02-12 ENCOUNTER — Encounter (HOSPITAL_COMMUNITY)
Admission: RE | Admit: 2023-02-12 | Discharge: 2023-02-12 | Disposition: A | Discharge status: Home / Self Care | Payer: Managed Care, Other (non HMO) | Source: Ambulatory Visit | Attending: Internal Medicine | Admitting: Internal Medicine

## 2023-02-12 ENCOUNTER — Encounter (HOSPITAL_COMMUNITY): Payer: Self-pay

## 2023-02-12 ENCOUNTER — Other Ambulatory Visit: Payer: Self-pay

## 2023-02-12 VITALS — BP 126/81 | HR 72 | Temp 97.8°F | Resp 18 | Ht 68.0 in | Wt 246.7 lb

## 2023-02-12 DIAGNOSIS — R1031 Right lower quadrant pain: Secondary | ICD-10-CM | POA: Insufficient documentation

## 2023-02-12 DIAGNOSIS — Z01812 Encounter for preprocedural laboratory examination: Secondary | ICD-10-CM | POA: Insufficient documentation

## 2023-02-12 DIAGNOSIS — T502X5A Adverse effect of carbonic-anhydrase inhibitors, benzothiadiazides and other diuretics, initial encounter: Secondary | ICD-10-CM | POA: Insufficient documentation

## 2023-02-12 DIAGNOSIS — X58XXXA Exposure to other specified factors, initial encounter: Secondary | ICD-10-CM | POA: Insufficient documentation

## 2023-02-12 DIAGNOSIS — E876 Hypokalemia: Secondary | ICD-10-CM | POA: Insufficient documentation

## 2023-02-12 LAB — RAPID URINE DRUG SCREEN, HOSP PERFORMED
Amphetamines: NOT DETECTED
Barbiturates: NOT DETECTED
Benzodiazepines: NOT DETECTED
Cocaine: NOT DETECTED
Opiates: NOT DETECTED
Tetrahydrocannabinol: NOT DETECTED

## 2023-02-12 MED ORDER — OMEPRAZOLE 20 MG PO CPDR
20.0000 mg | DELAYED_RELEASE_CAPSULE | ORAL | 3 refills | Status: AC | PRN
  Filled 2023-02-12: qty 30, 30d supply, fill #0

## 2023-02-13 ENCOUNTER — Telehealth: Payer: Self-pay | Admitting: *Deleted

## 2023-02-13 NOTE — Telephone Encounter (Signed)
Pt called and wanted to make sure he was doing his prep right for his procedure tomorrow. Went over instructions with pt. Verbalized understanding

## 2023-02-13 NOTE — Telephone Encounter (Signed)
Pt called back and stated he had gotten bisacodyl suppositories instead of tablets. Can he use that instead?  Advised pt that spoke with Dr.Carver and he said it would be ok to use the suppositories. Pt informed and verbalized understanding.

## 2023-02-14 ENCOUNTER — Other Ambulatory Visit: Payer: Self-pay

## 2023-02-14 ENCOUNTER — Ambulatory Visit (HOSPITAL_COMMUNITY): Payer: Managed Care, Other (non HMO) | Admitting: Anesthesiology

## 2023-02-14 ENCOUNTER — Encounter: Payer: Self-pay | Admitting: *Deleted

## 2023-02-14 ENCOUNTER — Encounter (HOSPITAL_COMMUNITY): Payer: Self-pay

## 2023-02-14 ENCOUNTER — Ambulatory Visit (HOSPITAL_COMMUNITY)
Admission: RE | Admit: 2023-02-14 | Discharge: 2023-02-14 | Disposition: A | Discharge status: Home / Self Care | Payer: Managed Care, Other (non HMO) | Attending: Internal Medicine | Admitting: Internal Medicine

## 2023-02-14 ENCOUNTER — Encounter (HOSPITAL_COMMUNITY)
Admission: RE | Disposition: A | Discharge status: Home / Self Care | Payer: Self-pay | Source: Home / Self Care | Attending: Internal Medicine

## 2023-02-14 DIAGNOSIS — I10 Essential (primary) hypertension: Secondary | ICD-10-CM | POA: Insufficient documentation

## 2023-02-14 DIAGNOSIS — K648 Other hemorrhoids: Secondary | ICD-10-CM | POA: Diagnosis not present

## 2023-02-14 DIAGNOSIS — K449 Diaphragmatic hernia without obstruction or gangrene: Secondary | ICD-10-CM | POA: Insufficient documentation

## 2023-02-14 DIAGNOSIS — R159 Full incontinence of feces: Secondary | ICD-10-CM | POA: Diagnosis present

## 2023-02-14 DIAGNOSIS — G4730 Sleep apnea, unspecified: Secondary | ICD-10-CM | POA: Diagnosis not present

## 2023-02-14 DIAGNOSIS — Z5986 Financial insecurity: Secondary | ICD-10-CM | POA: Diagnosis not present

## 2023-02-14 DIAGNOSIS — K219 Gastro-esophageal reflux disease without esophagitis: Secondary | ICD-10-CM | POA: Insufficient documentation

## 2023-02-14 DIAGNOSIS — F17210 Nicotine dependence, cigarettes, uncomplicated: Secondary | ICD-10-CM | POA: Diagnosis not present

## 2023-02-14 DIAGNOSIS — D123 Benign neoplasm of transverse colon: Secondary | ICD-10-CM | POA: Insufficient documentation

## 2023-02-14 DIAGNOSIS — K602 Anal fissure, unspecified: Secondary | ICD-10-CM | POA: Diagnosis not present

## 2023-02-14 HISTORY — PX: COLONOSCOPY WITH PROPOFOL: SHX5780

## 2023-02-14 HISTORY — PX: POLYPECTOMY: SHX5525

## 2023-02-14 SURGERY — COLONOSCOPY WITH PROPOFOL
Anesthesia: General

## 2023-02-14 MED ORDER — LACTATED RINGERS IV SOLN: INTRAVENOUS | Status: DC

## 2023-02-14 MED ORDER — GLYCOPYRROLATE PF 0.2 MG/ML IJ SOSY
PREFILLED_SYRINGE | INTRAMUSCULAR | Status: DC | PRN
  Administered 2023-02-14: .2 mg via INTRAVENOUS

## 2023-02-14 MED ORDER — LIDOCAINE HCL (CARDIAC) PF 100 MG/5ML IV SOSY
PREFILLED_SYRINGE | INTRAVENOUS | Status: DC | PRN
  Administered 2023-02-14: 100 mg via INTRAVENOUS

## 2023-02-14 MED ORDER — PROPOFOL 500 MG/50ML IV EMUL
INTRAVENOUS | Status: DC | PRN
  Administered 2023-02-14: 150 ug/kg/min via INTRAVENOUS

## 2023-02-14 MED ORDER — PROPOFOL 500 MG/50ML IV EMUL
INTRAVENOUS | Status: AC
  Filled 2023-02-14: qty 50

## 2023-02-14 MED ORDER — PROPOFOL 10 MG/ML IV BOLUS
INTRAVENOUS | Status: DC | PRN
  Administered 2023-02-14: 80 mg via INTRAVENOUS

## 2023-02-14 MED ORDER — GLYCOPYRROLATE PF 0.2 MG/ML IJ SOSY
PREFILLED_SYRINGE | INTRAMUSCULAR | Status: AC
  Filled 2023-02-14: qty 1

## 2023-02-14 MED ORDER — EPHEDRINE 5 MG/ML INJ
INTRAVENOUS | Status: AC
  Filled 2023-02-14: qty 5

## 2023-02-14 NOTE — Transfer of Care (Signed)
Immediate Anesthesia Transfer of Care Note  Patient: Samuel Irwin  Procedure(s) Performed: COLONOSCOPY WITH PROPOFOL POLYPECTOMY  Patient Location: Short Stay  Anesthesia Type:General  Level of Consciousness: awake and patient cooperative  Airway & Oxygen Therapy: Patient Spontanous Breathing  Post-op Assessment: Report given to RN and Post -op Vital signs reviewed and stable  Post vital signs: Reviewed and stable  Last Vitals:  Vitals Value Taken Time  BP    Temp 36.6 C 02/14/23 1417  Pulse 85 02/14/23 1417  Resp 15 02/14/23 1417  SpO2 98 % 02/14/23 1417    Last Pain:  Vitals:   02/14/23 1417  TempSrc: Oral  PainSc: 0-No pain      Patients Stated Pain Goal: 8 (02/14/23 1325)  Complications: No notable events documented.

## 2023-02-14 NOTE — H&P (Signed)
Primary Care Physician:  Marcine Matar, MD Primary Gastroenterologist:  Dr. Marletta Lor  Pre-Procedure History & Physical: HPI:  Samuel Irwin is a 62 y.o. male is here for a colonoscopy to be performed for proctalgia, fecal smearing.  Past Medical History:  Diagnosis Date   Constipation    Cough    Generalized headaches    GERD (gastroesophageal reflux disease)    H/O hiatal hernia    Heart murmur    Herpes    Hypertension    Pre-diabetes    Sleep apnea    uses BIPAP   Umbilical hernia    Weakness    Wheezing     Past Surgical History:  Procedure Laterality Date   BIOPSY  10/18/2014   Procedure: BIOPSY (Gastric);  Surgeon: West Bali, MD;  Location: [facility];  Service: Endoscopy;;   CARPAL TUNNEL RELEASE     R hand   COLONOSCOPY WITH PROPOFOL N/A 10/18/2014   IRS:WNIOEVOJ size external hemorrhoids/left colon is redundant   ESOPHAGOGASTRODUODENOSCOPY (EGD) WITH PROPOFOL N/A 10/18/2014   SLF: epigastric pain due to moderate NSAID gastritis   FEMUR SURGERY     left   HERNIA REPAIR  04/07/12   umb hernia repair   INSERTION OF MESH N/A 04/07/2012   Procedure: INSERTION OF MESH;  Surgeon: Lodema Pilot, DO;  Location: [facility];  Service: General;  Laterality: N/A;   KNEE ARTHROSCOPY     right   MINOR CARPAL TUNNEL Left 01/31/2016   Procedure: MINOR CARPAL TUNNEL RELEASE LEFT;  Surgeon: Dairl Ponder, MD;  Location: [facility];  Service: Orthopedics;  Laterality: Left;  Local   TOE SURGERY     dislocated lt foot   UMBILICAL HERNIA REPAIR N/A 04/07/2012   Procedure: HERNIA REPAIR UMBILICAL ADULT;  Surgeon: Lodema Pilot, DO;  Location: [facility];  Service: General;  Laterality: N/A;  open umbilical hernia repair with mesh    Prior to Admission medications   Medication Sig Start Date End Date Taking? Authorizing Provider  acyclovir (ZOVIRAX) 400 MG tablet Take 1 tablet (400 mg total) by mouth 2 (two) times daily. 10/18/22  Yes Marcine Matar, MD  atorvastatin  (LIPITOR) 20 MG tablet Take 1 tablet (20 mg total) by mouth daily. 10/18/22  Yes Marcine Matar, MD  hydrochlorothiazide (HYDRODIURIL) 25 MG tablet Take 1 tablet (25 mg total) by mouth daily. 12/09/22  Yes Marcine Matar, MD  naproxen (NAPROSYN) 375 MG tablet Take 1 tablet (375 mg total) by mouth 2 (two) times daily with a meal. 12/02/22  Yes Hagler, Arlys John, MD  omeprazole (PRILOSEC) 20 MG capsule Take 1 capsule (20 mg total) by mouth as needed (heartburn). 02/12/23  Yes Marcine Matar, MD  tamsulosin (FLOMAX) 0.4 MG CAPS capsule Take 1 capsule (0.4 mg total) by mouth daily. 10/18/22  Yes Marcine Matar, MD  fluticasone (FLONASE) 50 MCG/ACT nasal spray PLACE 2 SPRAYS INTO BOTH NOSTRILS DAILY. Patient not taking: Reported on 01/09/2023 01/26/20   Marcine Matar, MD  tadalafil (CIALIS) 10 MG tablet Take 10 mg by mouth daily as needed for erectile dysfunction.    [provider]  tadalafil (CIALIS) 20 MG tablet Take 20 mg by mouth daily as needed for erectile dysfunction.    [provider]    Allergies as of 01/10/2023   (No Known Allergies)    Family History  Problem Relation Age of Onset   Hypertension Mother    Cancer Father  lung   Colon cancer Neg Hx     Social History   Socioeconomic History   Marital status: Single    Spouse name: Not on file   Number of children: Not on file   Years of education: Not on file   Highest education level: Associate degree: occupational, Scientist, product/process development, or vocational program  Occupational History   Occupation: unemployed  Tobacco Use   Smoking status: Every Day    Current packs/day: 0.25    Average packs/day: 0.3 packs/day for 43.0 years (10.8 ttl pk-yrs)    Types: Cigarettes   Smokeless tobacco: Never   Tobacco comments:    occas still smokes when he drinks  Vaping Use   Vaping status: Never Used  Substance and Sexual Activity   Alcohol use: Yes    Alcohol/week: 4.0 standard drinks of alcohol    Types:  4 Cans of beer per week    Comment: once weekly   Drug use: Not Currently    Types: Cocaine, "Crack" cocaine    Comment: smoked crack--last used 5-23yrs   Sexual activity: Not Currently    Comment: "when I drink"  Other Topics Concern   Not on file  Social History Narrative   Not on file   Social Drivers of Health   Financial Resource Strain: Medium Risk (10/14/2022)   Overall Financial Resource Strain (CARDIA)    Difficulty of Paying Living Expenses: Somewhat hard  Food Insecurity: Food Insecurity Present (10/14/2022)   Hunger Vital Sign    Worried About Running Out of Food in the Last Year: Sometimes true    Ran Out of Food in the Last Year: Sometimes true  Transportation Needs: No Transportation Needs (10/14/2022)   PRAPARE - Administrator, Civil Service (Medical): No    Lack of Transportation (Non-Medical): No  Physical Activity: Not on file  Stress: Not on file  Social Connections: Unknown (10/14/2022)   Social Connection and Isolation Panel [NHANES]    Frequency of Communication with Friends and Family: Once a week    Frequency of Social Gatherings with Friends and Family: Patient declined    Attends Religious Services: 1 to 4 times per year    Active Member of [employer] or Organizations: No    Attends Engineer, structural: Not on file    Marital Status: Never married  Catering manager Violence: Not on file    Review of Systems: See HPI, otherwise negative ROS  Physical Exam: Vital signs in last 24 hours:     General:   Alert,  Well-developed, well-nourished, pleasant and cooperative in NAD Head:  Normocephalic and atraumatic. Eyes:  Sclera clear, no icterus.   Conjunctiva pink. Ears:  Normal auditory acuity. Nose:  No deformity, discharge,  or lesions. Msk:  Symmetrical without gross deformities. Normal posture. Extremities:  Without clubbing or edema. Neurologic:  Alert and  oriented x4;  grossly normal neurologically. Skin:  Intact without  significant lesions or rashes. Psych:  Alert and cooperative. Normal mood and affect.  Impression/Plan: Samuel Irwin is here for a colonoscopy to be performed for proctalgia, fecal smearing.  The risks of the procedure including infection, bleed, or perforation as well as benefits, limitations, alternatives and imponderables have been reviewed with the patient. Questions have been answered. All parties agreeable.

## 2023-02-14 NOTE — Discharge Instructions (Addendum)
  Colonoscopy Discharge Instructions  Read the instructions outlined below and refer to this sheet in the next few weeks. These discharge instructions provide you with general information on caring for yourself after you leave the hospital. Your doctor may also give you specific instructions. While your treatment has been planned according to the most current medical practices available, unavoidable complications occasionally occur.   ACTIVITY You may resume your regular activity, but move at a slower pace for the next 24 hours.  Take frequent rest periods for the next 24 hours.  Walking will help get rid of the air and reduce the bloated feeling in your belly (abdomen).  No driving for 24 hours (because of the medicine (anesthesia) used during the test).   Do not sign any important legal documents or operate any machinery for 24 hours (because of the anesthesia used during the test).  NUTRITION Drink plenty of fluids.  You may resume your normal diet as instructed by your doctor.  Begin with a light meal and progress to your normal diet. Heavy or fried foods are harder to digest and may make you feel sick to your stomach (nauseated).  Avoid alcoholic beverages for 24 hours or as instructed.  MEDICATIONS You may resume your normal medications unless your doctor tells you otherwise.  WHAT YOU CAN EXPECT TODAY Some feelings of bloating in the abdomen.  Passage of more gas than usual.  Spotting of blood in your stool or on the toilet paper.  IF YOU HAD POLYPS REMOVED DURING THE COLONOSCOPY: No aspirin products for 7 days or as instructed.  No alcohol for 7 days or as instructed.  Eat a soft diet for the next 24 hours.  FINDING OUT THE RESULTS OF YOUR TEST Not all test results are available during your visit. If your test results are not back during the visit, make an appointment with your caregiver to find out the results. Do not assume everything is normal if you have not heard from your  caregiver or the medical facility. It is important for you to follow up on all of your test results.  SEEK IMMEDIATE MEDICAL ATTENTION IF: You have more than a spotting of blood in your stool.  Your belly is swollen (abdominal distention).  You are nauseated or vomiting.  You have a temperature over 101.  You have abdominal pain or discomfort that is severe or gets worse throughout the day.   Your colonoscopy revealed 2 polyp(s) which I removed successfully. Await pathology results, my office will contact you. I recommend repeating colonoscopy in 7 years for surveillance purposes.   You also have an anal fissure which is likely causing your rectal pain. I will send in compounded cream to use twice daily to [facility]. They will call you before they fill the prescription.   You also have internal hemorrhoids. I would recommend increasing fiber in your diet or adding OTC Benefiber/Metamucil. Be sure to drink at least 4 to 6 glasses of water daily.   Follow-up with GI 6-8 weeks   I hope you have a great rest of your week!  Hennie Duos. Marletta Lor, D.O. Gastroenterology and Hepatology [facility]

## 2023-02-14 NOTE — Op Note (Signed)
[facility] Patient Name: Samuel Irwin Procedure Date: 02/14/2023 1:25 PM MRN: [id number] Date of Birth: [birth year] Attending MD: Hennie Duos. Marletta Lor , [place], [id number] CSN: [id number] Age: 62 Admit Type: Outpatient Procedure:                Colonoscopy Indications:              Fecal incontinence, Rectal mass Providers:                Hennie Duos. Marletta Lor, DO, Francoise Ceo RN, RN, Lennice Sites Technician, Technician Referring MD:              Medicines:                See the Anesthesia note for documentation of the                            administered medications Complications:            No immediate complications. Estimated Blood Loss:     Estimated blood loss was minimal. Procedure:                Pre-Anesthesia Assessment:                           - The anesthesia plan was to use monitored                            anesthesia care (MAC).                           After obtaining informed consent, the colonoscope                            was passed under direct vision. Throughout the                            procedure, the patient's blood pressure, pulse, and                            oxygen saturations were monitored continuously. The                            [id number]-HQ190L ([id number]) scope was introduced through                            the anus and advanced to the the cecum, identified                            by appendiceal orifice and ileocecal valve. The                            colonoscopy was performed without difficulty. The                            patient tolerated the procedure  well. The quality                            of the bowel preparation was evaluated using the                            BBPS [facility] Bowel Preparation Scale) with scores                            of: Right Colon = 3, Transverse Colon = 3 and Left                            Colon = 3 (entire mucosa seen well with no residual                             staining, small fragments of stool or opaque                            liquid). The total BBPS score equals 9. Scope In: 1:51:00 PM Scope Out: 2:10:59 PM Scope Withdrawal Time: 0 hours 16 minutes 44 seconds  Total Procedure Duration: 0 hours 19 minutes 59 seconds  Findings:      An anal fissure was found on perianal exam.      Non-bleeding internal hemorrhoids were found during retroflexion.      Two sessile polyps were found in the transverse colon. The polyps were 4       to 6 mm in size. These polyps were removed with a cold snare. Resection       and retrieval were complete.      The exam was otherwise without abnormality. Impression:               - Anal fissure found on perianal exam.                           - Non-bleeding internal hemorrhoids.                           - Two 4 to 6 mm polyps in the transverse colon,                            removed with a cold snare. Resected and retrieved.                           - The examination was otherwise normal. Moderate Sedation:      Per Anesthesia Care Recommendation:           - Patient has a contact number available for                            emergencies. The signs and symptoms of potential                            delayed complications were discussed with the  patient. Return to normal activities tomorrow.                            Written discharge instructions were provided to the                            patient.                           - Resume previous diet.                           - Continue present medications.                           - Await pathology results.                           - Repeat colonoscopy in 7 years for surveillance.                           - Return to GI clinic in 8 weeks.                           - Topical NTG cream rectally BID Procedure Code(s):        --- Professional ---                           [phone], Colonoscopy, flexible; with removal of                             tumor(s), polyp(s), or other lesion(s) by snare                            technique Diagnosis Code(s):        --- Professional ---                           K60.2, Anal fissure, unspecified                           D12.3, Benign neoplasm of transverse colon (hepatic                            flexure or splenic flexure)                           K64.8, Other hemorrhoids                           R15.9, Full incontinence of feces                           K62.89, Other specified diseases of anus and rectum CPT copyright 2022 American Medical Association. All rights reserved. The codes documented in this report are preliminary and upon coder review may  be revised to meet current compliance requirements. Hennie Duos. Marletta Lor, DO Leonette Most  Merrilyn Puma, DO 02/14/2023 2:14:54 PM This report has been signed electronically. Number of Addenda: 0

## 2023-02-14 NOTE — Anesthesia Preprocedure Evaluation (Signed)
Anesthesia Evaluation  Patient identified by MRN, date of birth, ID band Patient awake    Reviewed: Allergy & Precautions, H&P , NPO status , Patient's Chart, lab work & pertinent test results, reviewed documented beta blocker date and time   Airway Mallampati: II  TM Distance: >3 FB Neck ROM: full    Dental no notable dental hx.    Pulmonary neg pulmonary ROS, sleep apnea , Current Smoker and Patient abstained from smoking.   Pulmonary exam normal breath sounds clear to auscultation       Cardiovascular Exercise Tolerance: Good hypertension, negative cardio ROS + Valvular Problems/Murmurs  Rhythm:regular Rate:Normal     Neuro/Psych  Headaches  Neuromuscular disease negative neurological ROS  negative psych ROS   GI/Hepatic negative GI ROS, Neg liver ROS, hiatal hernia,GERD  ,,  Endo/Other  negative endocrine ROS    Renal/GU negative Renal ROS  negative genitourinary   Musculoskeletal   Abdominal   Peds  Hematology negative hematology ROS (+)   Anesthesia Other Findings   Reproductive/Obstetrics negative OB ROS                             Anesthesia Physical Anesthesia Plan  ASA: 3  Anesthesia Plan: General   Post-op Pain Management:    Induction:   PONV Risk Score and Plan: Propofol infusion  Airway Management Planned:   Additional Equipment:   Intra-op Plan:   Post-operative Plan:   Informed Consent: I have reviewed the patients History and Physical, chart, labs and discussed the procedure including the risks, benefits and alternatives for the proposed anesthesia with the patient or authorized representative who has indicated his/her understanding and acceptance.     Dental Advisory Given  Plan Discussed with: CRNA  Anesthesia Plan Comments:        Anesthesia Quick Evaluation

## 2023-02-18 NOTE — Anesthesia Postprocedure Evaluation (Signed)
Anesthesia Post Note  Patient: Samuel Irwin  Procedure(s) Performed: COLONOSCOPY WITH PROPOFOL POLYPECTOMY  Patient location during evaluation: Phase II Anesthesia Type: General Level of consciousness: awake Pain management: pain level controlled Vital Signs Assessment: post-procedure vital signs reviewed and stable Respiratory status: spontaneous breathing and respiratory function stable Cardiovascular status: blood pressure returned to baseline and stable Postop Assessment: no headache and no apparent nausea or vomiting Anesthetic complications: no Comments: Late entry   No notable events documented.   Last Vitals:  Vitals:   02/14/23 1417 02/14/23 1421  BP:  (!) 105/55  Pulse: 85   Resp: 15   Temp: 36.6 C   SpO2: 98%     Last Pain:  Vitals:   02/14/23 1417  TempSrc: Oral  PainSc: 0-No pain                 Windell Norfolk

## 2023-02-20 LAB — SURGICAL PATHOLOGY

## 2023-02-21 ENCOUNTER — Ambulatory Visit: Payer: Self-pay | Admitting: Internal Medicine

## 2023-02-24 ENCOUNTER — Other Ambulatory Visit: Payer: Self-pay

## 2023-03-06 ENCOUNTER — Encounter (HOSPITAL_COMMUNITY): Payer: Self-pay | Admitting: Internal Medicine

## 2023-03-21 ENCOUNTER — Other Ambulatory Visit: Payer: Self-pay | Admitting: Internal Medicine

## 2023-03-21 ENCOUNTER — Other Ambulatory Visit: Payer: Self-pay

## 2023-03-21 MED ORDER — TADALAFIL 20 MG PO TABS
20.0000 mg | ORAL_TABLET | Freq: Every day | ORAL | 3 refills | Status: AC | PRN
  Filled 2023-03-21: qty 10, 10d supply, fill #0

## 2023-03-24 ENCOUNTER — Other Ambulatory Visit: Payer: Self-pay

## 2023-04-01 ENCOUNTER — Other Ambulatory Visit: Payer: Self-pay

## 2023-04-03 ENCOUNTER — Ambulatory Visit: Payer: Managed Care, Other (non HMO) | Admitting: Gastroenterology

## 2023-04-08 ENCOUNTER — Encounter: Payer: Self-pay | Admitting: Internal Medicine

## 2023-04-15 ENCOUNTER — Other Ambulatory Visit: Payer: Self-pay | Admitting: Internal Medicine

## 2023-04-15 ENCOUNTER — Other Ambulatory Visit: Payer: Self-pay

## 2023-04-16 ENCOUNTER — Other Ambulatory Visit: Payer: Self-pay

## 2023-04-16 MED ORDER — ACYCLOVIR 400 MG PO TABS
400.0000 mg | ORAL_TABLET | Freq: Two times a day (BID) | ORAL | 1 refills | Status: DC
  Filled 2023-04-16: qty 60, 30d supply, fill #0
  Filled 2023-05-14: qty 60, 30d supply, fill #1
  Filled 2023-06-13: qty 60, 30d supply, fill #2
  Filled 2023-07-13: qty 60, 30d supply, fill #3
  Filled 2023-08-23: qty 60, 30d supply, fill #4
  Filled 2023-09-22: qty 60, 30d supply, fill #5

## 2023-04-17 ENCOUNTER — Other Ambulatory Visit: Payer: Self-pay | Admitting: Internal Medicine

## 2023-04-17 ENCOUNTER — Other Ambulatory Visit: Payer: Self-pay

## 2023-04-17 ENCOUNTER — Ambulatory Visit: Payer: Managed Care, Other (non HMO) | Admitting: Gastroenterology

## 2023-04-17 DIAGNOSIS — G5602 Carpal tunnel syndrome, left upper limb: Secondary | ICD-10-CM

## 2023-04-24 ENCOUNTER — Encounter: Payer: Self-pay | Admitting: Internal Medicine

## 2023-04-24 ENCOUNTER — Ambulatory Visit: Payer: Managed Care, Other (non HMO) | Attending: Internal Medicine | Admitting: Internal Medicine

## 2023-04-24 VITALS — BP 122/77 | HR 73 | Temp 98.2°F | Ht 68.0 in | Wt 254.0 lb

## 2023-04-24 DIAGNOSIS — R7303 Prediabetes: Secondary | ICD-10-CM

## 2023-04-24 DIAGNOSIS — E66812 Obesity, class 2: Secondary | ICD-10-CM | POA: Diagnosis not present

## 2023-04-24 DIAGNOSIS — F17210 Nicotine dependence, cigarettes, uncomplicated: Secondary | ICD-10-CM

## 2023-04-24 DIAGNOSIS — Z720 Tobacco use: Secondary | ICD-10-CM

## 2023-04-24 DIAGNOSIS — M545 Low back pain, unspecified: Secondary | ICD-10-CM

## 2023-04-24 DIAGNOSIS — Z6838 Body mass index (BMI) 38.0-38.9, adult: Secondary | ICD-10-CM | POA: Diagnosis not present

## 2023-04-24 DIAGNOSIS — I10 Essential (primary) hypertension: Secondary | ICD-10-CM

## 2023-04-24 DIAGNOSIS — M1711 Unilateral primary osteoarthritis, right knee: Secondary | ICD-10-CM

## 2023-04-24 DIAGNOSIS — E782 Mixed hyperlipidemia: Secondary | ICD-10-CM

## 2023-04-24 NOTE — Progress Notes (Signed)
 Patient ID: Samuel Irwin, male    DOB: May 12, 1960  MRN: [id number]  CC: Hypertension (HTN f/u./Back pain while working on home heater X2 weeks ago Dallie Piles received flu vax.)   Subjective: Samuel Irwin is a 63 y.o. male who presents for chronic ds management. His concerns today include:  [name], HL, [name], GERD, ED, tob dep, BPH, OSA on Bipap, Vit D def, dep/anxiety, paranoia (see 08/2017 visit), OA knee,  Discussed the use of AI scribe software for clinical note transcription with the patient, who gave verbal consent to proceed.  History of Present Illness   Samuel Irwin, a patient with hypertension, hyperlipidemia, [name]   [name]:  presents with a 12 pound weight gain since August. He tries to eat healthily at work, consuming fruits and smoothies, but he struggles with portion control at home.  He tends to overeat in the evenings at home.  He is aware that he needs to work on his eating habits and portion sizes.   OA knee: He attributes the weight gain to decreased physical activity as well due to knee pain. He reports sharp pains in his right knee, and recently, his left knee has also started to bother him. He has been advised by his orthopedic doctor that he is 'bone on bone' and has been offered injections or knee replacement, both of which he declined. He has been trying to manage the pain with exercises recommended by his doctor and recently started taking ibuprofen, which he reports has helped alleviate some of the pain. He plans to return to the gym and swimming, which he had stopped due to the knee pain. -Reports an episode of acute right-sided lower back pain about 2 weeks ago when he bent over to light a kerosene heater.  He felt a sharp pain when he stood back up.  Lasted for several days.  Took ibuprofen and it is better.  However he feels that the area may be swollen and would like for me to have a look at it.  HTN: He reports compliance  with taking HCTZ.  He limits salt in the foods.  He endorses headaches when blood pressure is high.  He reports that his blood pressure was elevated during a recent headache, but it went down after he sat down and drank some vinegar. HL: Taking and tolerating atorvastatin 20 mg daily.  Tob:  still smokes mainly on weekends.       Patient Active Problem List   Diagnosis Date Noted   Right lower quadrant abdominal pain 09/29/2020   Primary osteoarthritis, right shoulder 09/10/2020   Chronic pain of both shoulders 12/07/2019   Prediabetes 09/21/2019   Paranoid state (HCC) 09/20/2019   Tobacco use 02/10/2019   Chronic cough 12/29/2018   Other headache syndrome 02/12/2018   Arthritis pain of hand 02/12/2018   Scrotal mass 08/04/2017   Obesity (BMI 35.0-39.9 without comorbidity) 03/10/2017   Burning sensation of feet 09/23/2016   Floaters in visual field, right 09/23/2016   Osteoarthritis of right knee 02/28/2016   Chronic bilateral low back pain 02/28/2016   Poor dentition 01/29/2016   Essential hypertension 06/06/2015   BPH (benign prostatic hyperplasia) 05/04/2015   Other seasonal allergic rhinitis 05/04/2015   Target of perceived adverse discrimination or persecution 04/05/2015   Hemorrhoids 02/23/2015   Constipation 02/23/2015   GERD (gastroesophageal reflux disease) 09/21/2014   Carpal tunnel syndrome 08/12/2014   Erectile dysfunction 08/20/2013   Hyperlipidemia 08/20/2013   OSA (obstructive  sleep apnea) 08/24/2012     Current Outpatient Medications on File Prior to Visit  Medication Sig Dispense Refill   acyclovir (ZOVIRAX) 400 MG tablet Take 1 tablet (400 mg total) by mouth 2 (two) times daily. 180 tablet 1   atorvastatin (LIPITOR) 20 MG tablet Take 1 tablet (20 mg total) by mouth daily. 30 tablet 6   fluticasone (FLONASE) 50 MCG/ACT nasal spray PLACE 2 SPRAYS INTO BOTH NOSTRILS DAILY. 16 g 2   hydrochlorothiazide (HYDRODIURIL) 25 MG tablet Take 1 tablet (25 mg total) by  mouth daily. 90 tablet 1   naproxen (NAPROSYN) 375 MG tablet Take 1 tablet (375 mg total) by mouth 2 (two) times daily with a meal. 14 tablet 0   omeprazole (PRILOSEC) 20 MG capsule Take 1 capsule (20 mg total) by mouth as needed (heartburn). 30 capsule 3   tadalafil (CIALIS) 20 MG tablet Take 1 tablet (20 mg total) by mouth daily as needed for erectile dysfunction. 10 tablet 3   tamsulosin (FLOMAX) 0.4 MG CAPS capsule Take 1 capsule (0.4 mg total) by mouth daily. 30 capsule 6   No current facility-administered medications on file prior to visit.    No Known Allergies  Social History   Socioeconomic History   Marital status: Single    Spouse name: Not on file   Number of children: Not on file   Years of education: Not on file   Highest education level: Associate degree: occupational, Scientist, product/process development, or vocational program  Occupational History   Occupation: unemployed  Tobacco Use   Smoking status: Every Day    Current packs/day: 0.25    Average packs/day: 0.3 packs/day for 43.0 years (10.8 ttl pk-yrs)    Types: Cigarettes   Smokeless tobacco: Never   Tobacco comments:    occas still smokes when he drinks  Vaping Use   Vaping status: Never Used  Substance and Sexual Activity   Alcohol use: Yes    Alcohol/week: 4.0 standard drinks of alcohol    Types: 4 Cans of beer per week    Comment: once weekly   Drug use: Not Currently    Types: Cocaine, "Crack" cocaine    Comment: smoked crack--last used 5-39yrs   Sexual activity: Not Currently    Comment: "when I drink"  Other Topics Concern   Not on file  Social History Narrative   Not on file   Social Drivers of Health   Financial Resource Strain: Medium Risk (10/14/2022)   Overall Financial Resource Strain (CARDIA)    Difficulty of Paying Living Expenses: Somewhat hard  Food Insecurity: Food Insecurity Present (10/14/2022)   Hunger Vital Sign    Worried About Running Out of Food in the Last Year: Sometimes true    Ran Out of Food  in the Last Year: Sometimes true  Transportation Needs: No Transportation Needs (10/14/2022)   PRAPARE - Administrator, Civil Service (Medical): No    Lack of Transportation (Non-Medical): No  Physical Activity: Not on file  Stress: Not on file  Social Connections: Unknown (10/14/2022)   Social Connection and Isolation Panel [NHANES]    Frequency of Communication with Friends and Family: Once a week    Frequency of Social Gatherings with Friends and Family: Patient declined    Attends Religious Services: 1 to 4 times per year    Active Member of [employer] or Organizations: No    Attends Engineer, structural: Not on file    Marital Status: Never married  Intimate  Partner Violence: Not on file    Family History  Problem Relation Age of Onset   Hypertension Mother    Cancer Father        lung   Colon cancer Neg Hx     Past Surgical History:  Procedure Laterality Date   BIOPSY  10/18/2014   Procedure: BIOPSY (Gastric);  Surgeon: West Bali, MD;  Location: [facility];  Service: Endoscopy;;   CARPAL TUNNEL RELEASE     R hand   COLONOSCOPY WITH PROPOFOL N/A 10/18/2014   ZOX:[id number] size external hemorrhoids/left colon is redundant   COLONOSCOPY WITH PROPOFOL N/A 02/14/2023   Procedure: COLONOSCOPY WITH PROPOFOL;  Surgeon: Lanelle Bal, DO;  [facility]: [facility];  Service: Endoscopy;  Laterality: N/A;  2:30 pm, asa 2   ESOPHAGOGASTRODUODENOSCOPY (EGD) WITH PROPOFOL N/A 10/18/2014   SLF: epigastric pain due to moderate NSAID gastritis   FEMUR SURGERY     left   HERNIA REPAIR  04/07/12   umb hernia repair   INSERTION OF MESH N/A 04/07/2012   Procedure: INSERTION OF MESH;  Surgeon: Lodema Pilot, DO;  Location: [facility];  Service: General;  Laterality: N/A;   KNEE ARTHROSCOPY     right   MINOR CARPAL TUNNEL Left 01/31/2016   Procedure: MINOR CARPAL TUNNEL RELEASE LEFT;  Surgeon: Dairl Ponder, MD;  Location: [facility];  Service: Orthopedics;   Laterality: Left;  Local   POLYPECTOMY  02/14/2023   Procedure: POLYPECTOMY;  Surgeon: Lanelle Bal, DO;  [facility]: [facility];  Service: Endoscopy;;   TOE SURGERY     dislocated lt foot   UMBILICAL HERNIA REPAIR N/A 04/07/2012   Procedure: HERNIA REPAIR UMBILICAL ADULT;  Surgeon: Lodema Pilot, DO;  Location: [facility];  Service: General;  Laterality: N/A;  open umbilical hernia repair with mesh    ROS: Review of Systems Negative except as stated above  PHYSICAL EXAM: BP 122/77 (BP Location: Left Arm, Patient Position: Sitting, Cuff Size: Large)   Pulse 73   Temp 98.2 F (36.8 C) (Oral)   Ht 5\' 8"  (1.727 m)   Wt 254 lb (115.2 kg)   SpO2 96%   BMI 38.62 kg/m   Wt Readings from Last 3 Encounters:  04/24/23 254 lb (115.2 kg)  02/12/23 246 lb 11.1 oz (111.9 kg)  01/09/23 246 lb 9.6 oz (111.9 kg)    Physical Exam   General appearance - alert, well appearing, and in no distress Mental status - normal mood, behavior, speech, dress, motor activity, and thought processes Neck - supple, no significant adenopathy Chest - clear to auscultation, no wheezes, rales or rhonchi, symmetric air entry Heart - normal rate, regular rhythm, normal S1, S2, no murmurs, rubs, clicks or gallops Extremities: No lower extremity edema. MSK: No tenderness on palpation of the lumbar spine or surrounding paraspinal muscles.  I do not appreciate any swelling in the soft tissues.    Latest Ref Rng & Units 10/18/2022    4:37 PM 10/16/2022    8:40 AM 09/14/2021    9:10 AM  CMP  Glucose 70 - 99 mg/dL 97  981  191   BUN 8 - 27 mg/dL 11  14  12    Creatinine 0.76 - 1.27 mg/dL 4.78  2.95  6.21   Sodium 134 - 144 mmol/L 138  136  141   Potassium 3.5 - 5.2 mmol/L 3.8  3.4  3.9   Chloride 96 - 106 mmol/L 98  102  101  CO2 20 - 29 mmol/L 24  26  23    Calcium 8.6 - 10.2 mg/dL 40.9  9.3  9.6   Total Protein 6.0 - 8.5 g/dL 7.8   7.4   Total Bilirubin 0.0 - 1.2 mg/dL 0.9   0.7   Alkaline Phos 44 - 121 IU/L 82    80   AST 0 - 40 IU/L 19   16   ALT 0 - 44 IU/L 26   23    Lipid Panel     Component Value Date/Time   CHOL 150 10/18/2022 1637   TRIG 106 10/18/2022 1637   HDL 54 10/18/2022 1637   CHOLHDL 2.8 10/18/2022 1637   CHOLHDL 4.2 03/09/2014 1241   VLDL 36 03/09/2014 1241   LDLCALC 77 10/18/2022 1637    CBC    Component Value Date/Time   WBC 6.2 10/18/2022 1637   WBC 5.7 12/27/2016 1424   RBC 5.35 10/18/2022 1637   RBC 4.92 12/27/2016 1424   HGB 16.1 10/18/2022 1637   HCT 47.9 10/18/2022 1637   PLT 272 10/18/2022 1637   MCV 90 10/18/2022 1637   MCH 30.1 10/18/2022 1637   MCH 30.5 12/27/2016 1424   MCHC 33.6 10/18/2022 1637   MCHC 33.6 12/27/2016 1424   RDW 13.6 10/18/2022 1637   LYMPHSABS 1.9 04/30/2014 1246   MONOABS 1.9 (H) 04/30/2014 1246   EOSABS 0.0 04/30/2014 1246   BASOSABS 0.0 04/30/2014 1246    ASSESSMENT AND PLAN: 1. Essential hypertension (Primary) At goal.  Continue HCTZ 25 mg daily.  2. Class 2 severe obesity due to excess calories with serious comorbidity and body mass index (BMI) of 38.0 to 38.9 in adult [facility]) Discussed and encouraged healthy eating habits. He plans to get back in the gym to do some swimming for exercise.  3. Mixed hyperlipidemia Continue atorvastatin 20 mg daily  4. Tobacco use Strongly advised to quit.  5. Primary osteoarthritis of right knee Discussed importance of weight loss to help take mechanical strain of the knees.  He is not wanting any steroid injections or knee replacement surgery.  6. Acute right-sided low back pain without sciatica 2 weeks ago that is better now after use of ibuprofen.  Advised patient that when he bends over to try bed with his knees.  Try to avoid any heavy lifting, pushing or pulling.  7. Prediabetes See #2 above.  I had recommended checking an A1c today but patient wanted to hold off and work on his eating habits and exercise.  States that he will be agreeable to having the A1c done on his next  visit.    Patient was given the opportunity to ask questions.  Patient verbalized understanding of the plan and was able to repeat key elements of the plan.   This documentation was completed using Paediatric nurse.  Any transcriptional errors are unintentional.  No orders of the defined types were placed in this encounter.    Requested Prescriptions    No prescriptions requested or ordered in this encounter    Return in about 4 months (around 08/22/2023).  Jonah Blue, MD, FACP

## 2023-05-01 ENCOUNTER — Ambulatory Visit: Payer: Managed Care, Other (non HMO) | Admitting: Gastroenterology

## 2023-05-01 VITALS — BP 131/84 | HR 72 | Temp 98.2°F | Ht 68.0 in | Wt 256.2 lb

## 2023-05-01 DIAGNOSIS — K602 Anal fissure, unspecified: Secondary | ICD-10-CM

## 2023-05-01 DIAGNOSIS — K648 Other hemorrhoids: Secondary | ICD-10-CM | POA: Diagnosis not present

## 2023-05-01 DIAGNOSIS — K5900 Constipation, unspecified: Secondary | ICD-10-CM | POA: Diagnosis not present

## 2023-05-01 DIAGNOSIS — Z860101 Personal history of adenomatous and serrated colon polyps: Secondary | ICD-10-CM | POA: Diagnosis not present

## 2023-05-01 NOTE — Progress Notes (Signed)
 Gastroenterology Office Note     Primary Care Physician:  Marcine Matar, MD  Primary Gastroenterologist: Dr. Marletta Lor    Chief Complaint   Chief Complaint  Patient presents with   Follow-up    Follow up on fecal smearing. Its about the same     History of Present Illness   Samuel Irwin is a 63 y.o. male presenting today with a history of constipation, anal fissure, GERD, returning in follow-up after colonoscopy as noted below.    No rectal pain lately like before but did feel like it was flaring recently and trying to "come on".  Benefiber daily. On weekends slacks off. Will strain starting Monday. Feels bloated. May miss a day here and there. Declining rectal exam. Sometimes has to wipe a lot. He doesn't remember picking up the cream for fissure.      EGD 10/18/14: -Moderate worsening gastritis s/p biopsy -Normal duodenum -Advised omeprazole once daily.   Colonoscopy 10/18/14: -moderate internal and external hemorrhoids -redundant left colon -Denies use Preparation H 2-4 times daily for rectal pressure/bleeding and itching.  Also advised high-fiber diet.    Colonoscopy Dec 2024: anal fissure, non-bleeding internal hemorrhoids, two 4-6 mm polyps in transverse colon, 7 year surveillance, tubular adenomas.     Past Medical History:  Diagnosis Date   Constipation    Cough    Generalized headaches    GERD (gastroesophageal reflux disease)    H/O hiatal hernia    Heart murmur    Herpes    Hypertension    Pre-diabetes    Sleep apnea    uses BIPAP   Umbilical hernia    Weakness    Wheezing     Past Surgical History:  Procedure Laterality Date   BIOPSY  10/18/2014   Procedure: BIOPSY (Gastric);  Surgeon: West Bali, MD;  Location: [facility];  Service: Endoscopy;;   CARPAL TUNNEL RELEASE     R hand   COLONOSCOPY WITH PROPOFOL N/A 10/18/2014   ONG:EXBMWUXL size external hemorrhoids/left colon is redundant   COLONOSCOPY WITH PROPOFOL  N/A 02/14/2023   Procedure: COLONOSCOPY WITH PROPOFOL;  Surgeon: Lanelle Bal, DO;  [facility];  Service: Endoscopy;  Laterality: N/A;  2:30 pm, asa 2   ESOPHAGOGASTRODUODENOSCOPY (EGD) WITH PROPOFOL N/A 10/18/2014   SLF: epigastric pain due to moderate NSAID gastritis   FEMUR SURGERY     left   HERNIA REPAIR  04/07/12   umb hernia repair   INSERTION OF MESH N/A 04/07/2012   Procedure: INSERTION OF MESH;  Surgeon: Lodema Pilot, DO;  Location: [facility];  Service: General;  Laterality: N/A;   KNEE ARTHROSCOPY     right   MINOR CARPAL TUNNEL Left 01/31/2016   Procedure: MINOR CARPAL TUNNEL RELEASE LEFT;  Surgeon: Dairl Ponder, MD;  Location: [facility];  Service: Orthopedics;  Laterality: Left;  Local   POLYPECTOMY  02/14/2023   Procedure: POLYPECTOMY;  Surgeon: Lanelle Bal, DO;  [facility];  Service: Endoscopy;;   TOE SURGERY     dislocated lt foot   UMBILICAL HERNIA REPAIR N/A 04/07/2012   Procedure: HERNIA REPAIR UMBILICAL ADULT;  Surgeon: Lodema Pilot, DO;  Location: [facility];  Service: General;  Laterality: N/A;  open umbilical hernia repair with mesh    Current Outpatient Medications  Medication Sig Dispense Refill   acyclovir (ZOVIRAX) 400 MG tablet Take 1 tablet (400 mg total) by mouth 2 (  two) times daily. 180 tablet 1   atorvastatin (LIPITOR) 20 MG tablet Take 1 tablet (20 mg total) by mouth daily. 30 tablet 6   fluticasone (FLONASE) 50 MCG/ACT nasal spray PLACE 2 SPRAYS INTO BOTH NOSTRILS DAILY. 16 g 2   hydrochlorothiazide (HYDRODIURIL) 25 MG tablet Take 1 tablet (25 mg total) by mouth daily. 90 tablet 1   naproxen (NAPROSYN) 375 MG tablet Take 1 tablet (375 mg total) by mouth 2 (two) times daily with a meal. 14 tablet 0   omeprazole (PRILOSEC) 20 MG capsule Take 1 capsule (20 mg total) by mouth as needed (heartburn). 30 capsule 3   tadalafil (CIALIS) 20 MG tablet Take 1 tablet (20 mg total) by mouth daily as needed for erectile  dysfunction. 10 tablet 3   tamsulosin (FLOMAX) 0.4 MG CAPS capsule Take 1 capsule (0.4 mg total) by mouth daily. 30 capsule 6   No current facility-administered medications for this visit.    Allergies as of 05/01/2023   (No Known Allergies)    Family History  Problem Relation Age of Onset   Hypertension Mother    Cancer Father        lung   Colon cancer Neg Hx     Social History   Socioeconomic History   Marital status: Single    Spouse name: Not on file   Number of children: Not on file   Years of education: Not on file   Highest education level: Associate degree: occupational, Scientist, product/process development, or vocational program  Occupational History   Occupation: unemployed  Tobacco Use   Smoking status: Every Day    Current packs/day: 0.25    Average packs/day: 0.3 packs/day for 43.0 years (10.8 ttl pk-yrs)    Types: Cigarettes   Smokeless tobacco: Never   Tobacco comments:    occas still smokes when he drinks  Vaping Use   Vaping status: Never Used  Substance and Sexual Activity   Alcohol use: Yes    Alcohol/week: 4.0 standard drinks of alcohol    Types: 4 Cans of beer per week    Comment: once weekly   Drug use: Not Currently    Types: Cocaine, "Crack" cocaine    Comment: smoked crack--last used 5-57yrs   Sexual activity: Not Currently    Comment: "when I drink"  Other Topics Concern   Not on file  Social History Narrative   Not on file   Social Drivers of Health   Financial Resource Strain: Medium Risk (10/14/2022)   Overall Financial Resource Strain (CARDIA)    Difficulty of Paying Living Expenses: Somewhat hard  Food Insecurity: Food Insecurity Present (10/14/2022)   Hunger Vital Sign    Worried About Running Out of Food in the Last Year: Sometimes true    Ran Out of Food in the Last Year: Sometimes true  Transportation Needs: No Transportation Needs (10/14/2022)   PRAPARE - Administrator, Civil Service (Medical): No    Lack of Transportation  (Non-Medical): No  Physical Activity: Not on file  Stress: Not on file  Social Connections: Unknown (10/14/2022)   Social Connection and Isolation Panel [NHANES]    Frequency of Communication with Friends and Family: Once a week    Frequency of Social Gatherings with Friends and Family: Patient declined    Attends Religious Services: 1 to 4 times per year    Active Member of [employer] or Organizations: No    Attends Banker Meetings: Not on file    Marital  Status: Never married  Intimate Partner Violence: Not on file     Review of Systems   Gen: Denies any fever, chills, fatigue, weight loss, lack of appetite.  CV: Denies chest pain, heart palpitations, peripheral edema, syncope.  Resp: Denies shortness of breath at rest or with exertion. Denies wheezing or cough.  GI: Denies dysphagia or odynophagia. Denies jaundice, hematemesis, fecal incontinence. GU : Denies urinary burning, urinary frequency, urinary hesitancy MS: Denies joint pain, muscle weakness, cramps, or limitation of movement.  Derm: Denies rash, itching, dry skin Psych: Denies depression, anxiety, memory loss, and confusion Heme: Denies bruising, bleeding, and enlarged lymph nodes.   Physical Exam   BP 131/84   Pulse 72   Temp 98.2 F (36.8 C)   Ht 5\' 8"  (1.727 m)   Wt 256 lb 3.2 oz (116.2 kg)   BMI 38.96 kg/m  General:   Alert and oriented. Pleasant and cooperative. Well-nourished and well-developed.  Head:  Normocephalic and atraumatic. Eyes:  Without icterus Abdomen:  +BS, soft, non-tender and non-distended. No HSM noted. No guarding or rebound. No masses appreciated.  Rectal:  declined  Msk:  Symmetrical without gross deformities. Normal posture. Extremities:  Without edema. Neurologic:  Alert and  oriented x4;  grossly normal neurologically. Skin:  Intact without significant lesions or rashes. Psych:  Alert and cooperative. Normal mood and affect.   Assessment   Samuel Irwin is a 63  y.o. male presenting today with a history of  constipation, anal fissure, GERD, returning in follow-up after colonoscopy with findings of tubular adenomas, internal hemorrhoids, and anal fissure.  Anal fissure:unclear if completed topical course of therapy and likely did not pick up compounded cream. However, he notes improvement overall. We discussed in depth avoidance of straining, limiting toilet time, addressing constipation. As he is asymptomatic currently, we can follow clinically and low threshold for compounded cream. Patient is well-versed on MyChart and will message.   Constipation: continue Benefiber daily. Add Linzess 72 mcg as a trial. Will message with update.   Internal hemorrhoids: declined rectal exam today.   Adenomas: surveillance due in 2031    PLAN    Continue Benefiber daily Trial of Linzess 72 mcg Message on MyChart with update and if any recurrent rectal pain Return in 3months Banding in future if remains without rectal pain: patient declined rectal exam today   Gelene Mink, PhD, ANP-BC [facility]

## 2023-05-01 NOTE — Patient Instructions (Signed)
 Avoid straining, limit toilet time to 2-3 minutes, and avoid constipation.  I would like for you to continue Benefiber every day. Let's try the lowest dose of Linzess. Linzess works best when taken once a day every day, on an empty stomach, at least 30 minutes before your first meal of the day.  When Linzess is taken daily as directed:  *Constipation relief is typically felt in about a week *IBS-C patients may begin to experience relief from belly pain and overall abdominal symptoms (pain, discomfort, and bloating) in about 1 week,   with symptoms typically improving over 12 weeks.  Diarrhea may occur in the first 2 weeks -keep taking it.  The diarrhea should go away and you should start having normal, complete, full bowel movements. It may be helpful to start treatment when you can be near the comfort of your own bathroom, such as a weekend.    Let me know how it works for you so I can send in a prescription if needed! If you have any recurrent rectal pain, let me know.  We will see you in 3 months!   It was a pleasure to see you today. I want to create trusting relationships with patients and provide genuine, compassionate, and quality care. I truly value your feedback, so please be on the lookout for a survey regarding your visit with me today. I appreciate your time in completing this!         Gelene Mink, PhD, ANP-BC [facility]

## 2023-05-12 ENCOUNTER — Other Ambulatory Visit: Payer: Self-pay | Admitting: Internal Medicine

## 2023-05-12 ENCOUNTER — Other Ambulatory Visit: Payer: Self-pay

## 2023-05-12 MED ORDER — CYCLOBENZAPRINE HCL 5 MG PO TABS
5.0000 mg | ORAL_TABLET | Freq: Every day | ORAL | 0 refills | Status: DC | PRN
  Filled 2023-05-12: qty 20, 20d supply, fill #0

## 2023-05-13 ENCOUNTER — Other Ambulatory Visit: Payer: Self-pay

## 2023-05-14 ENCOUNTER — Other Ambulatory Visit: Payer: Self-pay

## 2023-05-14 ENCOUNTER — Other Ambulatory Visit: Payer: Self-pay | Admitting: Internal Medicine

## 2023-05-14 DIAGNOSIS — E782 Mixed hyperlipidemia: Secondary | ICD-10-CM

## 2023-05-14 DIAGNOSIS — N400 Enlarged prostate without lower urinary tract symptoms: Secondary | ICD-10-CM

## 2023-05-14 MED ORDER — TAMSULOSIN HCL 0.4 MG PO CAPS
0.4000 mg | ORAL_CAPSULE | Freq: Every day | ORAL | 6 refills | Status: DC
  Filled 2023-05-14: qty 30, 30d supply, fill #0
  Filled 2023-06-13: qty 30, 30d supply, fill #1
  Filled 2023-07-13: qty 30, 30d supply, fill #2
  Filled 2023-08-23: qty 30, 30d supply, fill #3
  Filled 2023-09-22: qty 30, 30d supply, fill #4
  Filled 2023-10-31: qty 30, 30d supply, fill #5
  Filled 2023-11-28: qty 30, 30d supply, fill #6

## 2023-05-14 MED ORDER — ATORVASTATIN CALCIUM 20 MG PO TABS
20.0000 mg | ORAL_TABLET | Freq: Every day | ORAL | 6 refills | Status: DC
  Filled 2023-05-14: qty 30, 30d supply, fill #0
  Filled 2023-06-13: qty 30, 30d supply, fill #1
  Filled 2023-07-13: qty 30, 30d supply, fill #2
  Filled 2023-08-23: qty 30, 30d supply, fill #3
  Filled 2023-09-22: qty 30, 30d supply, fill #4
  Filled 2023-10-31: qty 30, 30d supply, fill #5
  Filled 2023-11-28: qty 30, 30d supply, fill #6

## 2023-05-15 ENCOUNTER — Other Ambulatory Visit: Payer: Self-pay

## 2023-06-13 ENCOUNTER — Other Ambulatory Visit: Payer: Self-pay | Admitting: Internal Medicine

## 2023-06-13 ENCOUNTER — Other Ambulatory Visit: Payer: Self-pay

## 2023-06-13 DIAGNOSIS — I10 Essential (primary) hypertension: Secondary | ICD-10-CM

## 2023-06-13 MED ORDER — HYDROCHLOROTHIAZIDE 25 MG PO TABS
25.0000 mg | ORAL_TABLET | Freq: Every day | ORAL | 0 refills | Status: DC
  Filled 2023-06-13: qty 30, 30d supply, fill #0
  Filled 2023-07-13: qty 30, 30d supply, fill #1
  Filled 2023-08-23: qty 30, 30d supply, fill #2

## 2023-06-17 ENCOUNTER — Other Ambulatory Visit: Payer: Self-pay

## 2023-06-23 ENCOUNTER — Encounter: Payer: Self-pay | Admitting: Internal Medicine

## 2023-06-23 ENCOUNTER — Ambulatory Visit: Attending: Internal Medicine | Admitting: Internal Medicine

## 2023-06-23 VITALS — BP 122/78 | HR 68 | Temp 97.6°F | Ht 68.0 in | Wt 254.0 lb

## 2023-06-23 DIAGNOSIS — M545 Low back pain, unspecified: Secondary | ICD-10-CM

## 2023-06-23 NOTE — Progress Notes (Signed)
 Patient ID: Samuel Irwin, male    DOB: 20-Oct-1960  MRN: [id number]  CC: Back Pain (Back pain X3 - 4 mo ago/)   Subjective: [name] is a 63 y.o. male who presents for f/u on lower back pain His concerns today include:  [name], HL, [name], GERD, ED, tob dep, BPH, OSA on Bipap, Vit D def, dep/anxiety, paranoia (see 08/2017 visit), OA knee,   Discussed the use of AI scribe software for clinical note transcription with the patient, who gave verbal consent to proceed.  History of Present Illness   The patient, with a history of back pain and weight gain, presents for a follow-up visit for back pain. He reports that his back pain has improved significantly since his last visit. The pain, which was initially excruciating and lasted for two months, has now tapered off. He attributes the improvement to a combination of rest and the use of a muscle relaxant (cyclobenzaprine ), of which he only took one pill. He still has some pills on hand for future use if needed.  The patient also discusses his struggle with weight gain, which he attributes to a decrease in physical activity due to his back pain and knee problems. He has been swimming once a week and doing push-ups to try to increase his activity level. He is scheduled to receive steroid injections in both knees later this wk with ortho to help manage the pain and hopefully increase his ability to exercise.      Patient Active Problem List   Diagnosis Date Noted   Anal fissure 05/01/2023   Right lower quadrant abdominal pain 09/29/2020   Primary osteoarthritis, right shoulder 09/10/2020   Chronic pain of both shoulders 12/07/2019   Prediabetes 09/21/2019   Paranoid state (HCC) 09/20/2019   Tobacco use 02/10/2019   Chronic cough 12/29/2018   Other headache syndrome 02/12/2018   Arthritis pain of hand 02/12/2018   Scrotal mass 08/04/2017   Obesity (BMI 35.0-39.9 without comorbidity) 03/10/2017   Burning sensation  of feet 09/23/2016   Floaters in visual field, right 09/23/2016   Osteoarthritis of right knee 02/28/2016   Chronic bilateral low back pain 02/28/2016   Poor dentition 01/29/2016   Essential hypertension 06/06/2015   BPH (benign prostatic hyperplasia) 05/04/2015   Other seasonal allergic rhinitis 05/04/2015   Target of perceived adverse discrimination or persecution 04/05/2015   Hemorrhoids 02/23/2015   Constipation 02/23/2015   GERD (gastroesophageal reflux disease) 09/21/2014   Carpal tunnel syndrome 08/12/2014   Erectile dysfunction 08/20/2013   Hyperlipidemia 08/20/2013   OSA (obstructive sleep apnea) 08/24/2012     Current Outpatient Medications on File Prior to Visit  Medication Sig Dispense Refill   acyclovir  (ZOVIRAX ) 400 MG tablet Take 1 tablet (400 mg total) by mouth 2 (two) times daily. 180 tablet 1   atorvastatin  (LIPITOR) 20 MG tablet Take 1 tablet (20 mg total) by mouth daily. 30 tablet 6   cyclobenzaprine  (FLEXERIL ) 5 MG tablet Take 1 tablet (5 mg total) by mouth daily as needed for muscle spasms. 20 tablet 0   fluticasone  (FLONASE ) 50 MCG/ACT nasal spray PLACE 2 SPRAYS INTO BOTH NOSTRILS DAILY. 16 g 2   hydrochlorothiazide  (HYDRODIURIL ) 25 MG tablet Take 1 tablet (25 mg total) by mouth daily. Must have office visit for refills. 90 tablet 0   naproxen  (NAPROSYN ) 375 MG tablet Take 1 tablet (375 mg total) by mouth 2 (two) times daily with a meal. 14 tablet 0  omeprazole  (PRILOSEC) 20 MG capsule Take 1 capsule (20 mg total) by mouth as needed (heartburn). 30 capsule 3   tadalafil  (CIALIS ) 20 MG tablet Take 1 tablet (20 mg total) by mouth daily as needed for erectile dysfunction. 10 tablet 3   tamsulosin  (FLOMAX ) 0.4 MG CAPS capsule Take 1 capsule (0.4 mg total) by mouth daily.Must have office visit for refills. 30 capsule 6   No current facility-administered medications on file prior to visit.    No Known Allergies  Social History   Socioeconomic History   Marital  status: Single    Spouse name: Not on file   Number of children: Not on file   Years of education: Not on file   Highest education level: Associate degree: occupational, Scientist, product/process development, or vocational program  Occupational History   Occupation: unemployed  Tobacco Use   Smoking status: Every Day    Current packs/day: 0.25    Average packs/day: 0.3 packs/day for 43.0 years (10.8 ttl pk-yrs)    Types: Cigarettes   Smokeless tobacco: Never   Tobacco comments:    occas still smokes when he drinks  Vaping Use   Vaping status: Never Used  Substance and Sexual Activity   Alcohol use: Yes    Alcohol/week: 4.0 standard drinks of alcohol    Types: 4 Cans of beer per week    Comment: once weekly   Drug use: Not Currently    Types: Cocaine, "Crack" cocaine    Comment: smoked crack--last used 5-42yrs   Sexual activity: Not Currently    Comment: "when I drink"  Other Topics Concern   Not on file  Social History Narrative   Not on file   Social Drivers of Health   Financial Resource Strain: Medium Risk (10/14/2022)   Overall Financial Resource Strain (CARDIA)    Difficulty of Paying Living Expenses: Somewhat hard  Food Insecurity: Food Insecurity Present (10/14/2022)   Hunger Vital Sign    Worried About Running Out of Food in the Last Year: Sometimes true    Ran Out of Food in the Last Year: Sometimes true  Transportation Needs: No Transportation Needs (10/14/2022)   PRAPARE - Administrator, Civil Service (Medical): No    Lack of Transportation (Non-Medical): No  Physical Activity: Not on file  Stress: Not on file  Social Connections: Unknown (10/14/2022)   Social Connection and Isolation Panel [NHANES]    Frequency of Communication with Friends and Family: Once a week    Frequency of Social Gatherings with Friends and Family: Patient declined    Attends Religious Services: 1 to 4 times per year    Active Member of [employer] or Organizations: No    Attends Hospital doctor: Not on file    Marital Status: Never married  Intimate Partner Violence: Not on file    Family History  Problem Relation Age of Onset   Hypertension Mother    Cancer Father        lung   Colon cancer Neg Hx     Past Surgical History:  Procedure Laterality Date   BIOPSY  10/18/2014   Procedure: BIOPSY (Gastric);  Surgeon: Alyce Jubilee, MD;  Location: [facility];  Service: Endoscopy;;   CARPAL TUNNEL RELEASE     R hand   COLONOSCOPY WITH PROPOFOL  N/A 10/18/2014   ZOX:WRUEAVWU size external hemorrhoids/left colon is redundant   COLONOSCOPY WITH PROPOFOL  N/A 02/14/2023   Procedure: COLONOSCOPY WITH PROPOFOL ;  Surgeon: Vinetta Greening, DO;  Location: [facility];  Service: Endoscopy;  Laterality: N/A;  2:30 pm, asa 2   ESOPHAGOGASTRODUODENOSCOPY (EGD) WITH PROPOFOL  N/A 10/18/2014   SLF: epigastric pain due to moderate NSAID gastritis   FEMUR SURGERY     left   HERNIA REPAIR  04/07/12   umb hernia repair   INSERTION OF MESH N/A 04/07/2012   Procedure: INSERTION OF MESH;  Surgeon: Evander Hills, DO;  Location: [facility];  Service: General;  Laterality: N/A;   KNEE ARTHROSCOPY     right   MINOR CARPAL TUNNEL Left 01/31/2016   Procedure: MINOR CARPAL TUNNEL RELEASE LEFT;  Surgeon: Florida Hurter, MD;  Location: [facility];  Service: Orthopedics;  Laterality: Left;  Local   POLYPECTOMY  02/14/2023   Procedure: POLYPECTOMY;  Surgeon: Vinetta Greening, DO;  Location: [facility];  Service: Endoscopy;;   TOE SURGERY     dislocated lt foot   UMBILICAL HERNIA REPAIR N/A 04/07/2012   Procedure: HERNIA REPAIR UMBILICAL ADULT;  Surgeon: Evander Hills, DO;  Location: [facility];  Service: General;  Laterality: N/A;  open umbilical hernia repair with mesh    ROS: Review of Systems Negative except as stated above  PHYSICAL EXAM: BP 122/78 (BP Location: Left Arm, Patient Position: Sitting, Cuff Size: Large)   Pulse 68   Temp 97.6 F (36.4 C) (Oral)   Ht 5\' 8"  (1.727 m)    Wt 254 lb (115.2 kg)   SpO2 95%   BMI 38.62 kg/m   Wt Readings from Last 3 Encounters:  06/23/23 254 lb (115.2 kg)  05/01/23 256 lb 3.2 oz (116.2 kg)  04/24/23 254 lb (115.2 kg)    Physical Exam   General appearance - alert, well appearing, older obese African-American male and in no distress Mental status - normal mood, behavior, speech, dress, motor activity, and thought processes MSK: Patient bent over at the waist and right presents to see if back pain that he had previously would be reproduced.  It was not.    Latest Ref Rng & Units 10/18/2022    4:37 PM 10/16/2022    8:40 AM 09/14/2021    9:10 AM  CMP  Glucose 70 - 99 mg/dL 97  161  096   BUN 8 - 27 mg/dL 11  14  12    Creatinine 0.76 - 1.27 mg/dL 0.45  4.09  8.11   Sodium 134 - 144 mmol/L 138  136  141   Potassium 3.5 - 5.2 mmol/L 3.8  3.4  3.9   Chloride 96 - 106 mmol/L 98  102  101   CO2 20 - 29 mmol/L 24  26  23    Calcium  8.6 - 10.2 mg/dL 91.4  9.3  9.6   Total Protein 6.0 - 8.5 g/dL 7.8   7.4   Total Bilirubin 0.0 - 1.2 mg/dL 0.9   0.7   Alkaline Phos 44 - 121 IU/L 82   80   AST 0 - 40 IU/L 19   16   ALT 0 - 44 IU/L 26   23    Lipid Panel     Component Value Date/Time   CHOL 150 10/18/2022 1637   TRIG 106 10/18/2022 1637   HDL 54 10/18/2022 1637   CHOLHDL 2.8 10/18/2022 1637   CHOLHDL 4.2 03/09/2014 1241   VLDL 36 03/09/2014 1241   LDLCALC 77 10/18/2022 1637    CBC    Component Value Date/Time   WBC 6.2 10/18/2022 1637   WBC 5.7  12/27/2016 1424   RBC 5.35 10/18/2022 1637   RBC 4.92 12/27/2016 1424   HGB 16.1 10/18/2022 1637   HCT 47.9 10/18/2022 1637   PLT 272 10/18/2022 1637   MCV 90 10/18/2022 1637   MCH 30.1 10/18/2022 1637   MCH 30.5 12/27/2016 1424   MCHC 33.6 10/18/2022 1637   MCHC 33.6 12/27/2016 1424   RDW 13.6 10/18/2022 1637   LYMPHSABS 1.9 04/30/2014 1246   MONOABS 1.9 (H) 04/30/2014 1246   EOSABS 0.0 04/30/2014 1246   BASOSABS 0.0 04/30/2014 1246    ASSESSMENT AND PLAN: 1. Low  back pain without sciatica, unspecified back pain laterality, unspecified chronicity (Primary) Patient's back pain has significantly improved and just about resolved.  He will keep the muscle relaxant on hand to use as needed. Plans to increase swimming hopefully to 2-3 times a week after he gets injections to the knees later this week.      Patient was given the opportunity to ask questions.  Patient verbalized understanding of the plan and was able to repeat key elements of the plan.   This documentation was completed using Paediatric nurse.  Any transcriptional errors are unintentional.  No orders of the defined types were placed in this encounter.    Requested Prescriptions    No prescriptions requested or ordered in this encounter    No follow-ups on file.  Concetta Dee, MD, FACP

## 2023-06-25 ENCOUNTER — Ambulatory Visit: Admitting: Physician Assistant

## 2023-07-07 ENCOUNTER — Ambulatory Visit: Admitting: Physician Assistant

## 2023-07-07 ENCOUNTER — Encounter: Payer: Self-pay | Admitting: Physician Assistant

## 2023-07-07 DIAGNOSIS — M1711 Unilateral primary osteoarthritis, right knee: Secondary | ICD-10-CM | POA: Diagnosis not present

## 2023-07-07 DIAGNOSIS — M1712 Unilateral primary osteoarthritis, left knee: Secondary | ICD-10-CM | POA: Diagnosis not present

## 2023-07-07 MED ORDER — LIDOCAINE HCL 1 % IJ SOLN
3.0000 mL | INTRAMUSCULAR | Status: AC | PRN
  Administered 2023-07-07: 3 mL

## 2023-07-07 MED ORDER — METHYLPREDNISOLONE ACETATE 40 MG/ML IJ SUSP
40.0000 mg | INTRAMUSCULAR | Status: AC | PRN
  Administered 2023-07-07: 40 mg via INTRA_ARTICULAR

## 2023-07-07 NOTE — Progress Notes (Signed)
 Office Visit Note   Patient: Samuel Irwin           Date of Birth: 1960/05/12           MRN: [id number] Visit Date: 07/07/2023              Requested by: Lawrance Presume, MD [address] [place],  [place] [address] PCP: Lawrance Presume, MD   Assessment & Plan: Visit Diagnoses: Osteoarthritis bilateral knees  Plan: Pleasant 63 year old gentleman with history of osteoarthritis of both of his knees comes in today for steroid injections no recent injury right knee is more stiff and painful than the left  Follow-Up Instructions: Return if symptoms worsen or fail to improve.   Orders:  No orders of the defined types were placed in this encounter.  No orders of the defined types were placed in this encounter.     Procedures: Large Joint Inj: bilateral knee on 07/07/2023 4:13 PM Indications: pain and diagnostic evaluation Details: 25 G 1.5 in needle, anteromedial approach  Arthrogram: No  Medications (Right): 3 mL lidocaine  1 %; 40 mg methylPREDNISolone  acetate 40 MG/ML Medications (Left): 3 mL lidocaine  1 %; 40 mg methylPREDNISolone  acetate 40 MG/ML Outcome: tolerated well, no immediate complications Procedure, treatment alternatives, risks and benefits explained, specific risks discussed. Consent was given by the patient.       Clinical Data: No additional findings.   Subjective: Chief Complaint  Patient presents with   Right Knee - Pain   Left Knee - Pain    HPI pleasant 63 year old gentleman who comes in today for bilateral knee pain history of osteoarthritis of his bilateral knees no new injury  Review of Systems  All other systems reviewed and are negative.    Objective: Vital Signs: There were no vitals taken for this visit.  Physical Exam Constitutional:      Appearance: Normal appearance.  Pulmonary:     Effort: Pulmonary effort is normal.  Skin:    General: Skin is warm and dry.  Neurological:     General: No focal deficit  present.     Mental Status: He is alert and oriented to person, place, and time.  Psychiatric:        Mood and Affect: Mood normal.        Behavior: Behavior normal.     Ortho Exam Right knee a very small effusion no redness no erythema compartments are soft and nontender varus clinical alignment left knee no effusion no erythema compartments are soft and nontender there is clinical alignment Specialty Comments:  No specialty comments available.  Imaging: No results found.   PMFS History: Patient Active Problem List   Diagnosis Date Noted   Anal fissure 05/01/2023   Right lower quadrant abdominal pain 09/29/2020   Primary osteoarthritis, right shoulder 09/10/2020   Chronic pain of both shoulders 12/07/2019   Prediabetes 09/21/2019   Paranoid state (HCC) 09/20/2019   Tobacco use 02/10/2019   Chronic cough 12/29/2018   Other headache syndrome 02/12/2018   Arthritis pain of hand 02/12/2018   Scrotal mass 08/04/2017   Obesity (BMI 35.0-39.9 without comorbidity) 03/10/2017   Burning sensation of feet 09/23/2016   Floaters in visual field, right 09/23/2016   Osteoarthritis of knees, bilateral 02/28/2016   Chronic bilateral low back pain 02/28/2016   Poor dentition 01/29/2016   Essential hypertension 06/06/2015   BPH (benign prostatic hyperplasia) 05/04/2015   Other seasonal allergic rhinitis 05/04/2015   Target of perceived adverse  discrimination or persecution 04/05/2015   Hemorrhoids 02/23/2015   Constipation 02/23/2015   GERD (gastroesophageal reflux disease) 09/21/2014   Carpal tunnel syndrome 08/12/2014   Erectile dysfunction 08/20/2013   Hyperlipidemia 08/20/2013   OSA (obstructive sleep apnea) 08/24/2012   Past Medical History:  Diagnosis Date   Constipation    Cough    Generalized headaches    GERD (gastroesophageal reflux disease)    H/O hiatal hernia    Heart murmur    Herpes    Hypertension    Pre-diabetes    Sleep apnea    uses BIPAP   Umbilical  hernia    Weakness    Wheezing     Family History  Problem Relation Age of Onset   Hypertension Mother    Cancer Father        lung   Colon cancer Neg Hx     Past Surgical History:  Procedure Laterality Date   BIOPSY  10/18/2014   Procedure: BIOPSY (Gastric);  Surgeon: Alyce Jubilee, MD;  Location: [facility];  Service: Endoscopy;;   CARPAL TUNNEL RELEASE     R hand   COLONOSCOPY WITH PROPOFOL  N/A 10/18/2014   ZOX:WRUEAVWU size external hemorrhoids/left colon is redundant   COLONOSCOPY WITH PROPOFOL  N/A 02/14/2023   Procedure: COLONOSCOPY WITH PROPOFOL ;  Surgeon: Vinetta Greening, DO;  [facility];  Service: Endoscopy;  Laterality: N/A;  2:30 pm, asa 2   ESOPHAGOGASTRODUODENOSCOPY (EGD) WITH PROPOFOL  N/A 10/18/2014   SLF: epigastric pain due to moderate NSAID gastritis   FEMUR SURGERY     left   HERNIA REPAIR  04/07/12   umb hernia repair   INSERTION OF MESH N/A 04/07/2012   Procedure: INSERTION OF MESH;  Surgeon: Evander Hills, DO;  Location: [facility];  Service: General;  Laterality: N/A;   KNEE ARTHROSCOPY     right   MINOR CARPAL TUNNEL Left 01/31/2016   Procedure: MINOR CARPAL TUNNEL RELEASE LEFT;  Surgeon: Florida Hurter, MD;  Location: [facility];  Service: Orthopedics;  Laterality: Left;  Local   POLYPECTOMY  02/14/2023   Procedure: POLYPECTOMY;  Surgeon: Vinetta Greening, DO;  Location: [facility];  Service: Endoscopy;;   TOE SURGERY     dislocated lt foot   UMBILICAL HERNIA REPAIR N/A 04/07/2012   Procedure: HERNIA REPAIR UMBILICAL ADULT;  Surgeon: Evander Hills, DO;  Location: [facility];  Service: General;  Laterality: N/A;  open umbilical hernia repair with mesh   Social History   Occupational History   Occupation: unemployed  Tobacco Use   Smoking status: Every Day    Current packs/day: 0.25    Average packs/day: 0.3 packs/day for 43.0 years (10.8 ttl pk-yrs)    Types: Cigarettes   Smokeless tobacco: Never   Tobacco comments:    occas  still smokes when he drinks  Vaping Use   Vaping status: Never Used  Substance and Sexual Activity   Alcohol use: Yes    Alcohol/week: 4.0 standard drinks of alcohol    Types: 4 Cans of beer per week    Comment: once weekly   Drug use: Not Currently    Types: Cocaine, "Crack" cocaine    Comment: smoked crack--last used 5-25yrs   Sexual activity: Not Currently    Comment: "when I drink"

## 2023-07-14 ENCOUNTER — Other Ambulatory Visit: Payer: Self-pay

## 2023-07-14 ENCOUNTER — Ambulatory Visit (HOSPITAL_COMMUNITY): Admission: EM | Admit: 2023-07-14 | Discharge: 2023-07-14 | Disposition: A | Discharge status: Home / Self Care

## 2023-07-14 ENCOUNTER — Other Ambulatory Visit (HOSPITAL_COMMUNITY): Payer: Self-pay

## 2023-07-14 ENCOUNTER — Encounter (HOSPITAL_COMMUNITY): Payer: Self-pay | Admitting: *Deleted

## 2023-07-14 DIAGNOSIS — J069 Acute upper respiratory infection, unspecified: Secondary | ICD-10-CM | POA: Diagnosis not present

## 2023-07-14 LAB — POC SARS CORONAVIRUS 2 AG -  ED: SARS Coronavirus 2 Ag: NEGATIVE

## 2023-07-14 MED ORDER — IBUPROFEN 600 MG PO TABS
600.0000 mg | ORAL_TABLET | Freq: Four times a day (QID) | ORAL | 0 refills | Status: AC | PRN
  Filled 2023-07-14 (×3): qty 30, 8d supply, fill #0

## 2023-07-14 MED ORDER — PROMETHAZINE-DM 6.25-15 MG/5ML PO SYRP
5.0000 mL | ORAL_SOLUTION | Freq: Four times a day (QID) | ORAL | 0 refills | Status: DC | PRN
  Filled 2023-07-14: qty 118, 6d supply, fill #0

## 2023-07-14 MED ORDER — PROMETHAZINE-DM 6.25-15 MG/5ML PO SYRP
5.0000 mL | ORAL_SOLUTION | Freq: Four times a day (QID) | ORAL | 0 refills | Status: AC | PRN
Start: 2023-07-14 — End: ?
  Filled 2023-07-14 (×3): qty 118, 6d supply, fill #0

## 2023-07-14 MED ORDER — AZELASTINE HCL 0.1 % NA SOLN
1.0000 | Freq: Two times a day (BID) | NASAL | 1 refills | Status: DC
  Filled 2023-07-14: qty 30, 100d supply, fill #0

## 2023-07-14 MED ORDER — AZELASTINE HCL 0.1 % NA SOLN
1.0000 | Freq: Two times a day (BID) | NASAL | 1 refills | Status: AC
Start: 2023-07-14 — End: ?
  Filled 2023-07-14 (×2): qty 30, 34d supply, fill #0
  Filled 2023-07-14: qty 30, 50d supply, fill #0
  Filled 2023-11-02: qty 30, 34d supply, fill #1

## 2023-07-14 MED ORDER — IBUPROFEN 600 MG PO TABS
600.0000 mg | ORAL_TABLET | Freq: Four times a day (QID) | ORAL | 0 refills | Status: DC | PRN
  Filled 2023-07-14: qty 30, 8d supply, fill #0

## 2023-07-14 NOTE — ED Triage Notes (Addendum)
 C/O sinus pressure/congestion, HA, rhinorrhea onset 2 days ago. Denies any fevers. Has tried Sudafed. Pt states he also has lost his sense of smell, so he wishes to be checked for Covid.

## 2023-07-14 NOTE — Discharge Instructions (Signed)
  1. Viral URI with cough (Primary) - POC SARS Coronavirus 2 Ag-ED - Nasal Swab performed in UC is negative for COVID-19 - promethazine -dextromethorphan (PROMETHAZINE -DM) 6.25-15 MG/5ML syrup; Take 5 mLs by mouth 4 (four) times daily as needed for cough.  Dispense: 118 mL; Refill: 0 - ibuprofen  (ADVIL ) 600 MG tablet; Take 1 tablet (600 mg total) by mouth every 6 (six) hours as needed.  Dispense: 30 tablet; Refill: 0 - azelastine  (ASTELIN ) 0.1 % nasal spray; Place 1 spray into both nostrils 2 (two) times daily. Use in each nostril as directed  Dispense: 30 mL; Refill: 1

## 2023-07-14 NOTE — ED Provider Notes (Signed)
 [facility]  Note:  This document was prepared using Dragon voice recognition software and may include unintentional dictation errors.  MRN: [id number] DOB: October 26, 1960  Subjective:   Samuel Irwin is a 63 y.o. male presenting for headache, nasal sinus congestion, runny nose x 2 days.  Patient also reports he has mild cough and loss of smell, would like COVID testing today in urgent care.  Patient denies any fever, shortness of breath, chest pain, weakness, dizziness, sore throat patient is taking Sudafed with minimal improvement to symptoms.  No known sick contacts.  No current facility-administered medications for this encounter.  Current Outpatient Medications:    acyclovir  (ZOVIRAX ) 400 MG tablet, Take 1 tablet (400 mg total) by mouth 2 (two) times daily., Disp: 180 tablet, Rfl: 1   atorvastatin  (LIPITOR) 20 MG tablet, Take 1 tablet (20 mg total) by mouth daily., Disp: 30 tablet, Rfl: 6   azelastine  (ASTELIN ) 0.1 % nasal spray, Place 1 spray into both nostrils 2 (two) times daily. Use in each nostril as directed, Disp: 30 mL, Rfl: 1   cyclobenzaprine  (FLEXERIL ) 5 MG tablet, Take 1 tablet (5 mg total) by mouth daily as needed for muscle spasms., Disp: 20 tablet, Rfl: 0   fluticasone  (FLONASE ) 50 MCG/ACT nasal spray, PLACE 2 SPRAYS INTO BOTH NOSTRILS DAILY., Disp: 16 g, Rfl: 2   hydrochlorothiazide  (HYDRODIURIL ) 25 MG tablet, Take 1 tablet (25 mg total) by mouth daily. Must have office visit for refills., Disp: 90 tablet, Rfl: 0   ibuprofen  (ADVIL ) 600 MG tablet, Take 1 tablet (600 mg total) by mouth every 6 (six) hours as needed., Disp: 30 tablet, Rfl: 0   naproxen  (NAPROSYN ) 375 MG tablet, Take 1 tablet (375 mg total) by mouth 2 (two) times daily with a meal., Disp: 14 tablet, Rfl: 0   omeprazole  (PRILOSEC) 20 MG capsule, Take 1 capsule (20 mg total) by mouth as needed (heartburn)., Disp: 30 capsule, Rfl: 3   promethazine -dextromethorphan (PROMETHAZINE -DM) 6.25-15 MG/5ML  syrup, Take 5 mLs by mouth 4 (four) times daily as needed for cough., Disp: 118 mL, Rfl: 0   tadalafil  (CIALIS ) 20 MG tablet, Take 1 tablet (20 mg total) by mouth daily as needed for erectile dysfunction., Disp: 10 tablet, Rfl: 3   tamsulosin  (FLOMAX ) 0.4 MG CAPS capsule, Take 1 capsule (0.4 mg total) by mouth daily.Must have office visit for refills., Disp: 30 capsule, Rfl: 6   No Known Allergies  Past Medical History:  Diagnosis Date   Constipation    Cough    Generalized headaches    GERD (gastroesophageal reflux disease)    H/O hiatal hernia    Heart murmur    Herpes    Hypertension    Pre-diabetes    Sleep apnea    uses BIPAP   Umbilical hernia    Weakness    Wheezing      Past Surgical History:  Procedure Laterality Date   BIOPSY  10/18/2014   Procedure: BIOPSY (Gastric);  Surgeon: Alyce Jubilee, MD;  Location: [facility];  Service: Endoscopy;;   CARPAL TUNNEL RELEASE     R hand   COLONOSCOPY WITH PROPOFOL  N/A 10/18/2014   QMV:HQIONGEX size external hemorrhoids/left colon is redundant   COLONOSCOPY WITH PROPOFOL  N/A 02/14/2023   Procedure: COLONOSCOPY WITH PROPOFOL ;  Surgeon: Vinetta Greening, DO;  [facility];  Service: Endoscopy;  Laterality: N/A;  2:30 pm, asa 2   ESOPHAGOGASTRODUODENOSCOPY (EGD) WITH PROPOFOL  N/A 10/18/2014   SLF: epigastric pain due to  moderate NSAID gastritis   FEMUR SURGERY     left   HERNIA REPAIR  04/07/12   umb hernia repair   INSERTION OF MESH N/A 04/07/2012   Procedure: INSERTION OF MESH;  Surgeon: Evander Hills, DO;  [facility];  Service: General;  Laterality: N/A;   KNEE ARTHROSCOPY     right   MINOR CARPAL TUNNEL Left 01/31/2016   Procedure: MINOR CARPAL TUNNEL RELEASE LEFT;  Surgeon: Florida Hurter, MD;  Location: [facility];  Service: Orthopedics;  Laterality: Left;  Local   POLYPECTOMY  02/14/2023   Procedure: POLYPECTOMY;  Surgeon: Vinetta Greening, DO;  [facility];  Service: Endoscopy;;    TOE SURGERY     dislocated lt foot   UMBILICAL HERNIA REPAIR N/A 04/07/2012   Procedure: HERNIA REPAIR UMBILICAL ADULT;  Surgeon: Evander Hills, DO;  Location: [facility];  Service: General;  Laterality: N/A;  open umbilical hernia repair with mesh    Family History  Problem Relation Age of Onset   Hypertension Mother    Cancer Father        lung   Colon cancer Neg Hx     Social History   Tobacco Use   Smoking status: Every Day    Current packs/day: 0.25    Average packs/day: 0.3 packs/day for 43.0 years (10.8 ttl pk-yrs)    Types: Cigarettes   Smokeless tobacco: Never   Tobacco comments:    occas still smokes when he drinks  Vaping Use   Vaping status: Never Used  Substance Use Topics   Alcohol use: Yes    Alcohol/week: 4.0 standard drinks of alcohol    Types: 4 Cans of beer per week    Comment: once weekly   Drug use: Not Currently    Types: Cocaine, "Crack" cocaine    Comment: smoked crack--last used 5-24yrs    ROS Refer to HPI for ROS details.  Objective:   Vitals: BP 132/86   Pulse 92   Temp 98.1 F (36.7 C) (Oral)   Resp 18   SpO2 97%   Physical Exam Vitals and nursing note reviewed.  Constitutional:      General: He is not in acute distress.    Appearance: Normal appearance. He is well-developed. He is not ill-appearing or toxic-appearing.  HENT:     Head: Normocephalic.     Nose: Congestion and rhinorrhea present.     Mouth/Throat:     Mouth: Mucous membranes are moist.  Eyes:     General:        Right eye: No discharge.        Left eye: No discharge.     Extraocular Movements: Extraocular movements intact.     Conjunctiva/sclera: Conjunctivae normal.  Cardiovascular:     Rate and Rhythm: Normal rate and regular rhythm.     Heart sounds: Normal heart sounds. No murmur heard. Pulmonary:     Effort: Pulmonary effort is normal. No respiratory distress.     Breath sounds: Normal breath sounds. No stridor. No wheezing, rhonchi or rales.  Skin:     General: Skin is warm and dry.  Neurological:     General: No focal deficit present.     Mental Status: He is alert and oriented to person, place, and time.  Psychiatric:        Mood and Affect: Mood normal.        Behavior: Behavior normal.     Procedures  Results for orders placed  or performed during the hospital encounter of 07/14/23 (from the past 24 hours)  POC SARS Coronavirus 2 Ag-ED - Nasal Swab     Status: None   Collection Time: 07/14/23  3:20 PM  Result Value Ref Range   SARS Coronavirus 2 Ag Negative Negative    No results found.   Assessment and Plan :     Discharge Instructions       1. Viral URI with cough (Primary) - POC SARS Coronavirus 2 Ag-ED - Nasal Swab performed in UC is negative for COVID-19 - promethazine -dextromethorphan (PROMETHAZINE -DM) 6.25-15 MG/5ML syrup; Take 5 mLs by mouth 4 (four) times daily as needed for cough.  Dispense: 118 mL; Refill: 0 - ibuprofen  (ADVIL ) 600 MG tablet; Take 1 tablet (600 mg total) by mouth every 6 (six) hours as needed.  Dispense: 30 tablet; Refill: 0 - azelastine  (ASTELIN ) 0.1 % nasal spray; Place 1 spray into both nostrils 2 (two) times daily. Use in each nostril as directed  Dispense: 30 mL; Refill: 1    Lashay Osborne B [place], [place] B, NP 07/14/23 1606

## 2023-07-15 ENCOUNTER — Other Ambulatory Visit: Payer: Self-pay

## 2023-07-30 ENCOUNTER — Other Ambulatory Visit: Payer: Self-pay

## 2023-07-30 MED ORDER — OXYCODONE-ACETAMINOPHEN 5-325 MG PO TABS
ORAL_TABLET | ORAL | 0 refills | Status: DC
  Filled 2023-07-30: qty 25, 5d supply, fill #0

## 2023-08-07 ENCOUNTER — Encounter: Payer: Self-pay | Admitting: Gastroenterology

## 2023-08-07 ENCOUNTER — Ambulatory Visit: Payer: Self-pay | Admitting: Gastroenterology

## 2023-08-07 VITALS — BP 126/82 | HR 68 | Temp 98.3°F | Ht 68.0 in | Wt 256.0 lb

## 2023-08-07 DIAGNOSIS — K5900 Constipation, unspecified: Secondary | ICD-10-CM

## 2023-08-07 DIAGNOSIS — K219 Gastro-esophageal reflux disease without esophagitis: Secondary | ICD-10-CM

## 2023-08-07 DIAGNOSIS — K602 Anal fissure, unspecified: Secondary | ICD-10-CM

## 2023-08-07 NOTE — Patient Instructions (Signed)
 Continue Benefiber.   Let's try the Linzess 72 mcg again. Linzess works best when taken once a day every day, on an empty stomach, at least 30 minutes before your first meal of the day.  When Linzess is taken daily as directed:  *Constipation relief is typically felt in about a week *IBS-C patients may begin to experience relief from belly pain and overall abdominal symptoms (pain, discomfort, and bloating) in about 1 week,   with symptoms typically improving over 12 weeks.  Diarrhea may occur in the first 2 weeks -keep taking it.  The diarrhea should go away and you should start having normal, complete, full bowel movements. It may be helpful to start treatment when you can be near the comfort of your own bathroom, such as a weekend.    Let me know how this does for you!  I have attached a reflux sheet for you.  We will see you in 6 months!  I enjoyed seeing you again today! I value our relationship and want to provide genuine, compassionate, and quality care. You may receive a survey regarding your visit with me, and I welcome your feedback! Thanks so much for taking the time to complete this. I look forward to seeing you again.      Delman Ferns, PhD, ANP-BC [facility]

## 2023-08-07 NOTE — Progress Notes (Signed)
 Gastroenterology Office Note     Primary Care Physician:  Lawrance Presume, MD  Primary Gastroenterologist: Dr. Mordechai April   Chief Complaint   Chief Complaint  Patient presents with   Follow-up     History of Present Illness   Samuel Irwin is a 63 y.o. male presenting today with a history of  constipation, anal fissure, GERD, last seen in March 2025 with recommendations to continue fiber and add Linzess 72 mcg.   Returns today stating he uses a stool softener daliy. Tried Linzess but only a few days. Most days doesn't have to strain. Sometimes straining. Hemorrhoids much improved from past. No rectal pain. Declining rectal exam. Not interested in banding. No abdominal pain. No overt GI bleeding.   Omeprazole  20 mg but not daily. GERD overall controlled with prn use.     EGD 10/18/14: -Moderate worsening gastritis s/p biopsy -Normal duodenum -Advised omeprazole  once daily.   Colonoscopy 10/18/14: -moderate internal and external hemorrhoids -redundant left colon -Denies use Preparation H 2-4 times daily for rectal pressure/bleeding and itching.  Also advised high-fiber diet.       Colonoscopy Dec 2024: anal fissure, non-bleeding internal hemorrhoids, two 4-6 mm polyps in transverse colon, 7 year surveillance, tubular adenomas.        Past Medical History:  Diagnosis Date   Constipation    Cough    Generalized headaches    GERD (gastroesophageal reflux disease)    H/O hiatal hernia    Heart murmur    Herpes    Hypertension    Pre-diabetes    Sleep apnea    uses BIPAP   Umbilical hernia    Weakness    Wheezing     Past Surgical History:  Procedure Laterality Date   BIOPSY  10/18/2014   Procedure: BIOPSY (Gastric);  Surgeon: Alyce Jubilee, MD;  Location: [facility];  Service: Endoscopy;;   CARPAL TUNNEL RELEASE     R hand   COLONOSCOPY WITH PROPOFOL  N/A 10/18/2014   JYN:WGNFAOZH size external hemorrhoids/left colon is redundant   COLONOSCOPY WITH  PROPOFOL  N/A 02/14/2023   Procedure: COLONOSCOPY WITH PROPOFOL ;  Surgeon: Vinetta Greening, DO;  [facility];  Service: Endoscopy;  Laterality: N/A;  2:30 pm, asa 2   ESOPHAGOGASTRODUODENOSCOPY (EGD) WITH PROPOFOL  N/A 10/18/2014   SLF: epigastric pain due to moderate NSAID gastritis   FEMUR SURGERY     left   HERNIA REPAIR  04/07/12   umb hernia repair   INSERTION OF MESH N/A 04/07/2012   Procedure: INSERTION OF MESH;  Surgeon: Evander Hills, DO;  [facility];  Service: General;  Laterality: N/A;   KNEE ARTHROSCOPY     right   MINOR CARPAL TUNNEL Left 01/31/2016   Procedure: MINOR CARPAL TUNNEL RELEASE LEFT;  Surgeon: Florida Hurter, MD;  Location: [facility];  Service: Orthopedics;  Laterality: Left;  Local   POLYPECTOMY  02/14/2023   Procedure: POLYPECTOMY;  Surgeon: Vinetta Greening, DO;  [facility];  Service: Endoscopy;;   TOE SURGERY     dislocated lt foot   UMBILICAL HERNIA REPAIR N/A 04/07/2012   Procedure: HERNIA REPAIR UMBILICAL ADULT;  Surgeon: Evander Hills, DO;  Location: [facility];  Service: General;  Laterality: N/A;  open umbilical hernia repair with mesh    Current Outpatient Medications  Medication Sig Dispense Refill   acyclovir  (ZOVIRAX ) 400 MG tablet Take 1 tablet (400 mg total) by mouth 2 (two) times daily. 180 tablet  1   atorvastatin  (LIPITOR) 20 MG tablet Take 1 tablet (20 mg total) by mouth daily. 30 tablet 6   azelastine  (ASTELIN ) 0.1 % nasal spray Place 1 spray into both nostrils 2 (two) times daily. Use in each nostril as directed 30 mL 1   cyclobenzaprine  (FLEXERIL ) 5 MG tablet Take 1 tablet (5 mg total) by mouth daily as needed for muscle spasms. 20 tablet 0   fluticasone  (FLONASE ) 50 MCG/ACT nasal spray PLACE 2 SPRAYS INTO BOTH NOSTRILS DAILY. 16 g 2   hydrochlorothiazide  (HYDRODIURIL ) 25 MG tablet Take 1 tablet (25 mg total) by mouth daily. Must have office visit for refills. 90 tablet 0   ibuprofen  (ADVIL ) 600 MG  tablet Take 1 tablet (600 mg total) by mouth every 6 (six) hours as needed. 30 tablet 0   naproxen  (NAPROSYN ) 375 MG tablet Take 1 tablet (375 mg total) by mouth 2 (two) times daily with a meal. 14 tablet 0   omeprazole  (PRILOSEC) 20 MG capsule Take 1 capsule (20 mg total) by mouth as needed (heartburn). 30 capsule 3   oxyCODONE -acetaminophen  (PERCOCET/ROXICET) 5-325 MG tablet 1-2 tablets, by mouth, every 4-6 hours as needed for pain. NOT TO EXCEED 6 tablets a day. 25 tablet 0   promethazine -dextromethorphan (PROMETHAZINE -DM) 6.25-15 MG/5ML syrup Take 5 mLs by mouth 4 (four) times daily as needed for cough. 118 mL 0   tadalafil  (CIALIS ) 20 MG tablet Take 1 tablet (20 mg total) by mouth daily as needed for erectile dysfunction. 10 tablet 3   tamsulosin  (FLOMAX ) 0.4 MG CAPS capsule Take 1 capsule (0.4 mg total) by mouth daily.Must have office visit for refills. 30 capsule 6   No current facility-administered medications for this visit.    Allergies as of 08/07/2023   (No Known Allergies)    Family History  Problem Relation Age of Onset   Hypertension Mother    Cancer Father        lung   Colon cancer Neg Hx     Social History   Socioeconomic History   Marital status: Single    Spouse name: Not on file   Number of children: Not on file   Years of education: Not on file   Highest education level: Associate degree: occupational, Scientist, product/process development, or vocational program  Occupational History   Occupation: unemployed  Tobacco Use   Smoking status: Every Day    Current packs/day: 0.25    Average packs/day: 0.3 packs/day for 43.0 years (10.8 ttl pk-yrs)    Types: Cigarettes   Smokeless tobacco: Never   Tobacco comments:    occas still smokes when he drinks  Vaping Use   Vaping status: Never Used  Substance and Sexual Activity   Alcohol use: Yes    Alcohol/week: 4.0 standard drinks of alcohol    Types: 4 Cans of beer per week    Comment: once weekly   Drug use: Not Currently    Types:  Cocaine, Crack cocaine    Comment: smoked crack--last used 5-25yrs   Sexual activity: Not on file    Comment: when I drink  Other Topics Concern   Not on file  Social History Narrative   Not on file   Social Drivers of Health   Financial Resource Strain: Medium Risk (10/14/2022)   Overall Financial Resource Strain (CARDIA)    Difficulty of Paying Living Expenses: Somewhat hard  Food Insecurity: Food Insecurity Present (10/14/2022)   Hunger Vital Sign    Worried About Programme researcher, broadcasting/film/video in  the Last Year: Sometimes true    Ran Out of Food in the Last Year: Sometimes true  Transportation Needs: No Transportation Needs (10/14/2022)   PRAPARE - Administrator, Civil Service (Medical): No    Lack of Transportation (Non-Medical): No  Physical Activity: Not on file  Stress: Not on file  Social Connections: Unknown (10/14/2022)   Social Connection and Isolation Panel    Frequency of Communication with Friends and Family: Once a week    Frequency of Social Gatherings with Friends and Family: Patient declined    Attends Religious Services: 1 to 4 times per year    Active Member of [employer] or Organizations: No    Attends Engineer, structural: Not on file    Marital Status: Never married  Intimate Partner Violence: Not on file     Review of Systems   Gen: Denies any fever, chills, fatigue, weight loss, lack of appetite.  CV: Denies chest pain, heart palpitations, peripheral edema, syncope.  Resp: Denies shortness of breath at rest or with exertion. Denies wheezing or cough.  GI: Denies dysphagia or odynophagia. Denies jaundice, hematemesis, fecal incontinence. GU : Denies urinary burning, urinary frequency, urinary hesitancy MS: Denies joint pain, muscle weakness, cramps, or limitation of movement.  Derm: Denies rash, itching, dry skin Psych: Denies depression, anxiety, memory loss, and confusion Heme: Denies bruising, bleeding, and enlarged lymph  nodes.   Physical Exam   BP 126/82 (BP Location: Right Arm, Patient Position: Sitting, Cuff Size: Large)   Pulse 68   Temp 98.3 F (36.8 C) (Oral)   Ht 5' 8 (1.727 m)   Wt 256 lb (116.1 kg)   SpO2 97%   BMI 38.92 kg/m  General:   Alert and oriented. Pleasant and cooperative. Well-nourished and well-developed.  Head:  Normocephalic and atraumatic. Eyes:  Without icterus Abdomen:  +BS, soft, non-tender and non-distended. No HSM noted. No guarding or rebound. No masses appreciated.  Rectal:  Declined Msk:  Symmetrical without gross deformities. Normal posture. Extremities:  Without edema. Neurologic:  Alert and  oriented x4;  grossly normal neurologically. Skin:  Intact without significant lesions or rashes. Psych:  Alert and cooperative. Normal mood and affect.   Assessment   Samuel Irwin is a 63 y.o. male presenting today with a history of constipation, anal fissure, GERD, last seen in March 2025 with recommendations to continue fiber and add Linzess 72 mcg.   GERD: doing well on prn omeprazole , no alarm signs/symptoms.  Constipation: continues on fiber and still straining at times. Will trial Linzess 72 mcg daily again.   Anal fissure: symptomatically improved. Declining rectal exam. Colonoscopy on file Dec 2024.      PLAN    Continue Benefiber Linzess 72 mcg daily Omeprazole  prn Colonoscopy 2031 Return in 6 months   Delman Ferns, PhD, [facility]

## 2023-08-22 ENCOUNTER — Ambulatory Visit: Payer: Managed Care, Other (non HMO) | Admitting: Internal Medicine

## 2023-08-26 ENCOUNTER — Other Ambulatory Visit: Payer: Self-pay

## 2023-09-22 ENCOUNTER — Other Ambulatory Visit: Payer: Self-pay

## 2023-09-22 ENCOUNTER — Other Ambulatory Visit: Payer: Self-pay | Admitting: Internal Medicine

## 2023-09-22 DIAGNOSIS — I10 Essential (primary) hypertension: Secondary | ICD-10-CM

## 2023-09-23 ENCOUNTER — Other Ambulatory Visit: Payer: Self-pay

## 2023-09-24 ENCOUNTER — Other Ambulatory Visit: Payer: Self-pay

## 2023-10-02 ENCOUNTER — Other Ambulatory Visit: Payer: Self-pay | Admitting: Internal Medicine

## 2023-10-02 DIAGNOSIS — I10 Essential (primary) hypertension: Secondary | ICD-10-CM

## 2023-10-03 ENCOUNTER — Other Ambulatory Visit: Payer: Self-pay | Admitting: Internal Medicine

## 2023-10-03 ENCOUNTER — Other Ambulatory Visit: Payer: Self-pay

## 2023-10-03 ENCOUNTER — Other Ambulatory Visit (HOSPITAL_COMMUNITY): Payer: Self-pay

## 2023-10-03 DIAGNOSIS — I10 Essential (primary) hypertension: Secondary | ICD-10-CM

## 2023-10-03 MED ORDER — HYDROCHLOROTHIAZIDE 25 MG PO TABS
25.0000 mg | ORAL_TABLET | Freq: Every day | ORAL | 0 refills | Status: DC
  Filled 2023-10-03 – 2023-10-06 (×2): qty 30, 30d supply, fill #0

## 2023-10-06 ENCOUNTER — Other Ambulatory Visit: Payer: Self-pay

## 2023-10-06 ENCOUNTER — Encounter: Payer: Self-pay | Admitting: Pharmacist

## 2023-10-06 ENCOUNTER — Other Ambulatory Visit (HOSPITAL_COMMUNITY): Payer: Self-pay

## 2023-10-07 ENCOUNTER — Other Ambulatory Visit: Payer: Self-pay

## 2023-10-09 ENCOUNTER — Other Ambulatory Visit: Payer: Self-pay

## 2023-10-10 ENCOUNTER — Telehealth: Payer: Self-pay | Admitting: Internal Medicine

## 2023-10-10 NOTE — Telephone Encounter (Signed)
 Confirmed appt for 8/18

## 2023-10-13 ENCOUNTER — Other Ambulatory Visit: Payer: Self-pay

## 2023-10-13 ENCOUNTER — Ambulatory Visit: Attending: Internal Medicine | Admitting: Internal Medicine

## 2023-10-13 VITALS — BP 124/73 | HR 66 | Temp 97.9°F | Ht 68.0 in | Wt 253.0 lb

## 2023-10-13 DIAGNOSIS — E782 Mixed hyperlipidemia: Secondary | ICD-10-CM

## 2023-10-13 DIAGNOSIS — E66812 Obesity, class 2: Secondary | ICD-10-CM

## 2023-10-13 DIAGNOSIS — Z125 Encounter for screening for malignant neoplasm of prostate: Secondary | ICD-10-CM

## 2023-10-13 DIAGNOSIS — M79632 Pain in left forearm: Secondary | ICD-10-CM

## 2023-10-13 DIAGNOSIS — R300 Dysuria: Secondary | ICD-10-CM

## 2023-10-13 DIAGNOSIS — N401 Enlarged prostate with lower urinary tract symptoms: Secondary | ICD-10-CM

## 2023-10-13 DIAGNOSIS — I10 Essential (primary) hypertension: Secondary | ICD-10-CM

## 2023-10-13 DIAGNOSIS — Z6838 Body mass index (BMI) 38.0-38.9, adult: Secondary | ICD-10-CM

## 2023-10-13 DIAGNOSIS — R7303 Prediabetes: Secondary | ICD-10-CM | POA: Diagnosis not present

## 2023-10-13 DIAGNOSIS — Z87891 Personal history of nicotine dependence: Secondary | ICD-10-CM

## 2023-10-13 LAB — POCT GLYCOSYLATED HEMOGLOBIN (HGB A1C): HbA1c, POC (prediabetic range): 5.9 % (ref 5.7–6.4)

## 2023-10-13 LAB — GLUCOSE, POCT (MANUAL RESULT ENTRY): POC Glucose: 96 mg/dL (ref 70–99)

## 2023-10-13 MED ORDER — MELOXICAM 15 MG PO TABS
15.0000 mg | ORAL_TABLET | Freq: Every day | ORAL | 0 refills | Status: AC
Start: 2023-10-13 — End: ?
  Filled 2023-10-13: qty 30, 30d supply, fill #0

## 2023-10-13 MED ORDER — HYDROCHLOROTHIAZIDE 25 MG PO TABS
25.0000 mg | ORAL_TABLET | Freq: Every day | ORAL | 1 refills | Status: AC
  Filled 2023-10-13 – 2023-11-02 (×3): qty 90, 90d supply, fill #0
  Filled 2023-11-28 – 2024-01-19 (×2): qty 90, 90d supply, fill #1

## 2023-10-13 NOTE — Patient Instructions (Signed)
  VISIT SUMMARY: Today, you came in for a follow-up visit to discuss your urinary issues, blood pressure, and cholesterol management. We also reviewed your recent blood sugar levels and addressed the pain in your left forearm.  YOUR PLAN: -BENIGN PROSTATIC HYPERPLASIA WITH LOWER URINARY TRACT SYMPTOMS: This condition involves an enlarged prostate gland that can cause urinary problems. Your symptoms have improved with increased water  intake, so we will continue your current medication, tamsulosin . We will also perform a urinalysis to check for any other issues.  -HYPERTENSION: Your blood pressure is well-controlled with your current medication, hydrochlorothiazide . Continue to limit your salt intake, even though you have some dietary indiscretions.  -HYPERLIPIDEMIA: This condition involves high levels of fats in your blood. Continue taking atorvastatin  20 mg daily. We will order a lipid panel to monitor your cholesterol levels.  -PREDIABETES: Your recent blood test shows that your blood sugar levels are slightly elevated. We discussed how your diet can impact your blood sugar and offered a referral to a nutritionist. Continue with your exercise routine and make dietary modifications as needed.  -LEFT FOREARM MYALGIA: This is muscle pain in your left forearm, likely related to your previous carpal tunnel surgery and recent physical activity. You can take meloxicam  as needed for pain and use over-the-counter Icy Hot rub for relief.  INSTRUCTIONS: Please follow up with a nutritionist for dietary counseling regarding your prediabetes. Continue with your current medications and lifestyle modifications. We will perform a urinalysis and order a lipid panel to monitor your cholesterol levels.                      Contains text generated by Abridge.                                 Contains text generated by Abridge.

## 2023-10-13 NOTE — Progress Notes (Signed)
 Patient ID: Samuel Irwin, male    DOB: Apr 17, 1960  MRN: [id number]  CC: Hypertension (HTN f/u./Requesting routine blood work - PSA /Urinary issues - stinging sensation while urinating, trouble urinating - difficulty with hard )   Subjective: Samuel Irwin is a 63 y.o. male who presents for chronic ds management. His concerns today include:  [name], HL, [name], GERD, ED, tob dep, BPH, OSA on Bipap, Vit D def, dep/anxiety, paranoia (see 08/2017 visit), OA knee,   Discussed the use of AI scribe software for clinical note transcription with the patient, who gave verbal consent to proceed.  History of Present Illness   Samuel Irwin is a 63 year old male with hypertension and hyperlipidemia who presents for follow-up regarding urinary issues, blood pressure, and cholesterol management.  He experiences a stinging sensation at the tip of the penis, which has improved but still occurs intermittently.  Denies any burning when he urinates or penile discharge.  He is not concerned about having an STI.  There is difficulty achieving a full urine flow, particularly in the morning, though it has improved with increased water  intake. He urinates once at night and frequently during the day due to high water  consumption. He is currently taking tamsulosin  and notes that the stinging sensation began after starting the medication.   HTN: Reports compliance with taking hydrochlorothiazide  25 mg daily.  He limits salt intake but consumes salty foods like crackers and chips.  For hyperlipidemia, he takes atorvastatin  20 mg daily.  Obesity/prediabetes:  Results for orders placed or performed in visit on 10/13/23  POCT glucose (manual entry)   Collection Time: 10/13/23  4:22 PM  Result Value Ref Range   POC Glucose 96 70 - 99 mg/dl  POCT glycosylated hemoglobin (Hb A1C)   Collection Time: 10/13/23  4:29 PM  Result Value Ref Range   Hemoglobin A1C     HbA1c POC (<> result,  manual entry)     HbA1c, POC (prediabetic range) 5.9 5.7 - 6.4 %   HbA1c, POC (controlled diabetic range)    He generally maintains a good exercise routine, swimming three times a week. He has lost weight, attributing some of it to water  weight.  Reports that he has been doing well with his eating habits until this past weekend when he ate 15 wings. He consumes fruits like watermelon and grapes and occasionally drinks sugary beverages.  He reports a sharp pain in his and soreness in his left forearm, which he attributes to a previous carpal tunnel surgery and recent physical activity working with a starter.  He is requesting prescription for meloxicam  to use as needed.  Tobacco dependence: He quit smoking 1 month ago.  He also quit drinking completely 1 month ago.     Patient Active Problem List   Diagnosis Date Noted   Anal fissure 05/01/2023   Right lower quadrant abdominal pain 09/29/2020   Primary osteoarthritis, right shoulder 09/10/2020   Chronic pain of both shoulders 12/07/2019   Prediabetes 09/21/2019   Paranoid state (HCC) 09/20/2019   Tobacco use 02/10/2019   Chronic cough 12/29/2018   Other headache syndrome 02/12/2018   Arthritis pain of hand 02/12/2018   Scrotal mass 08/04/2017   Obesity (BMI 35.0-39.9 without comorbidity) 03/10/2017   Burning sensation of feet 09/23/2016   Floaters in visual field, right 09/23/2016   Osteoarthritis of knees, bilateral 02/28/2016   Chronic bilateral low back pain 02/28/2016   Poor dentition 01/29/2016  Essential hypertension 06/06/2015   BPH (benign prostatic hyperplasia) 05/04/2015   Other seasonal allergic rhinitis 05/04/2015   Target of perceived adverse discrimination or persecution 04/05/2015   Hemorrhoids 02/23/2015   Constipation 02/23/2015   GERD (gastroesophageal reflux disease) 09/21/2014   Carpal tunnel syndrome 08/12/2014   Erectile dysfunction 08/20/2013   Hyperlipidemia 08/20/2013   OSA (obstructive sleep apnea)  08/24/2012     Current Outpatient Medications on File Prior to Visit  Medication Sig Dispense Refill   acyclovir  (ZOVIRAX ) 400 MG tablet Take 1 tablet (400 mg total) by mouth 2 (two) times daily. 180 tablet 1   atorvastatin  (LIPITOR) 20 MG tablet Take 1 tablet (20 mg total) by mouth daily. 30 tablet 6   azelastine  (ASTELIN ) 0.1 % nasal spray Place 1 spray into both nostrils 2 (two) times daily. Use in each nostril as directed 30 mL 1   cyclobenzaprine  (FLEXERIL ) 5 MG tablet Take 1 tablet (5 mg total) by mouth daily as needed for muscle spasms. 20 tablet 0   fluticasone  (FLONASE ) 50 MCG/ACT nasal spray PLACE 2 SPRAYS INTO BOTH NOSTRILS DAILY. 16 g 2   ibuprofen  (ADVIL ) 600 MG tablet Take 1 tablet (600 mg total) by mouth every 6 (six) hours as needed. 30 tablet 0   omeprazole  (PRILOSEC) 20 MG capsule Take 1 capsule (20 mg total) by mouth as needed (heartburn). 30 capsule 3   oxyCODONE -acetaminophen  (PERCOCET/ROXICET) 5-325 MG tablet 1-2 tablets, by mouth, every 4-6 hours as needed for pain. NOT TO EXCEED 6 tablets a day. 25 tablet 0   promethazine -dextromethorphan (PROMETHAZINE -DM) 6.25-15 MG/5ML syrup Take 5 mLs by mouth 4 (four) times daily as needed for cough. 118 mL 0   tadalafil  (CIALIS ) 20 MG tablet Take 1 tablet (20 mg total) by mouth daily as needed for erectile dysfunction. 10 tablet 3   tamsulosin  (FLOMAX ) 0.4 MG CAPS capsule Take 1 capsule (0.4 mg total) by mouth daily.Must have office visit for refills. 30 capsule 6   No current facility-administered medications on file prior to visit.    No Known Allergies  Social History   Socioeconomic History   Marital status: Single    Spouse name: Not on file   Number of children: Not on file   Years of education: Not on file   Highest education level: Associate degree: occupational, Scientist, product/process development, or vocational program  Occupational History   Occupation: unemployed  Tobacco Use   Smoking status: Every Day    Current packs/day: 0.25     Average packs/day: 0.3 packs/day for 43.0 years (10.8 ttl pk-yrs)    Types: Cigarettes   Smokeless tobacco: Never   Tobacco comments:    occas still smokes when he drinks  Vaping Use   Vaping status: Never Used  Substance and Sexual Activity   Alcohol use: Yes    Alcohol/week: 4.0 standard drinks of alcohol    Types: 4 Cans of beer per week    Comment: once weekly   Drug use: Not Currently    Types: Cocaine, Crack cocaine    Comment: smoked crack--last used 5-47yrs   Sexual activity: Not on file    Comment: when I drink  Other Topics Concern   Not on file  Social History Narrative   Not on file   Social Drivers of Health   Financial Resource Strain: Low Risk  (10/13/2023)   Overall Financial Resource Strain (CARDIA)    Difficulty of Paying Living Expenses: Not very hard  Food Insecurity: No Food Insecurity (10/13/2023)  Hunger Vital Sign    Worried About Running Out of Food in the Last Year: Never true    Ran Out of Food in the Last Year: Never true  Transportation Needs: No Transportation Needs (10/13/2023)   PRAPARE - Administrator, Civil Service (Medical): No    Lack of Transportation (Non-Medical): No  Physical Activity: Sufficiently Active (10/13/2023)   Exercise Vital Sign    Days of Exercise per Week: 3 days    Minutes of Exercise per Session: 90 min  Stress: No Stress Concern Present (10/13/2023)   [employer] of Occupational Health - Occupational Stress Questionnaire    Feeling of Stress: Not at all  Social Connections: Moderately Isolated (10/13/2023)   Social Connection and Isolation Panel    Frequency of Communication with Friends and Family: Once a week    Frequency of Social Gatherings with Friends and Family: More than three times a week    Attends Religious Services: Never    Database administrator or Organizations: No    Attends Banker Meetings: Never    Marital Status: Living with partner  Intimate Partner Violence: Not  At Risk (10/13/2023)   Humiliation, Afraid, Rape, and Kick questionnaire    Fear of Current or Ex-Partner: No    Emotionally Abused: No    Physically Abused: No    Sexually Abused: No    Family History  Problem Relation Age of Onset   Hypertension Mother    Cancer Father        lung   Colon cancer Neg Hx     Past Surgical History:  Procedure Laterality Date   BIOPSY  10/18/2014   Procedure: BIOPSY (Gastric);  Surgeon: Margo LITTIE Haddock, MD;  Location: [facility];  Service: Endoscopy;;   CARPAL TUNNEL RELEASE     R hand   COLONOSCOPY WITH PROPOFOL  N/A 10/18/2014   DOQ:fnizmjuz size external hemorrhoids/left colon is redundant   COLONOSCOPY WITH PROPOFOL  N/A 02/14/2023   Procedure: COLONOSCOPY WITH PROPOFOL ;  Surgeon: Cindie Carlin POUR, DO;  Location: [facility];  Service: Endoscopy;  Laterality: N/A;  2:30 pm, asa 2   ESOPHAGOGASTRODUODENOSCOPY (EGD) WITH PROPOFOL  N/A 10/18/2014   SLF: epigastric pain due to moderate NSAID gastritis   FEMUR SURGERY     left   HERNIA REPAIR  04/07/12   umb hernia repair   INSERTION OF MESH N/A 04/07/2012   Procedure: INSERTION OF MESH;  Surgeon: Redell Faith, DO;  Location: [facility];  Service: General;  Laterality: N/A;   KNEE ARTHROSCOPY     right   MINOR CARPAL TUNNEL Left 01/31/2016   Procedure: MINOR CARPAL TUNNEL RELEASE LEFT;  Surgeon: Donnice Robinsons, MD;  Location: [facility];  Service: Orthopedics;  Laterality: Left;  Local   POLYPECTOMY  02/14/2023   Procedure: POLYPECTOMY;  Surgeon: Cindie Carlin POUR, DO;  Location: [facility];  Service: Endoscopy;;   TOE SURGERY     dislocated lt foot   UMBILICAL HERNIA REPAIR N/A 04/07/2012   Procedure: HERNIA REPAIR UMBILICAL ADULT;  Surgeon: Redell Faith, DO;  Location: [facility];  Service: General;  Laterality: N/A;  open umbilical hernia repair with mesh    ROS: Review of Systems Negative except as stated above  PHYSICAL EXAM: BP 124/73 (BP Location: Left Arm, Patient Position:  Sitting, Cuff Size: Normal)   Pulse 66   Temp 97.9 F (36.6 C) (Oral)   Ht 5' 8 (1.727 m)   Wt 253 lb (114.8  kg)   SpO2 95%   BMI 38.47 kg/m   Wt Readings from Last 3 Encounters:  10/13/23 253 lb (114.8 kg)  08/07/23 256 lb (116.1 kg)  06/23/23 254 lb (115.2 kg)    Physical Exam General appearance - alert, well appearing, obese older African-American male and in no distress Mental status - normal mood, behavior, speech, dress, motor activity, and thought processes Chest - clear to auscultation, no wheezes, rales or rhonchi, symmetric air entry Heart - normal rate, regular rhythm, normal S1, S2, no murmurs, rubs, clicks or gallops     Latest Ref Rng & Units 10/18/2022    4:37 PM 10/16/2022    8:40 AM 09/14/2021    9:10 AM  CMP  Glucose 70 - 99 mg/dL 97  894  892   BUN 8 - 27 mg/dL 11  14  12    Creatinine 0.76 - 1.27 mg/dL 8.99  9.08  8.99   Sodium 134 - 144 mmol/L 138  136  141   Potassium 3.5 - 5.2 mmol/L 3.8  3.4  3.9   Chloride 96 - 106 mmol/L 98  102  101   CO2 20 - 29 mmol/L 24  26  23    Calcium  8.6 - 10.2 mg/dL 89.9  9.3  9.6   Total Protein 6.0 - 8.5 g/dL 7.8   7.4   Total Bilirubin 0.0 - 1.2 mg/dL 0.9   0.7   Alkaline Phos 44 - 121 IU/L 82   80   AST 0 - 40 IU/L 19   16   ALT 0 - 44 IU/L 26   23    Lipid Panel     Component Value Date/Time   CHOL 150 10/18/2022 1637   TRIG 106 10/18/2022 1637   HDL 54 10/18/2022 1637   CHOLHDL 2.8 10/18/2022 1637   CHOLHDL 4.2 03/09/2014 1241   VLDL 36 03/09/2014 1241   LDLCALC 77 10/18/2022 1637    CBC    Component Value Date/Time   WBC 6.2 10/18/2022 1637   WBC 5.7 12/27/2016 1424   RBC 5.35 10/18/2022 1637   RBC 4.92 12/27/2016 1424   HGB 16.1 10/18/2022 1637   HCT 47.9 10/18/2022 1637   PLT 272 10/18/2022 1637   MCV 90 10/18/2022 1637   MCH 30.1 10/18/2022 1637   MCH 30.5 12/27/2016 1424   MCHC 33.6 10/18/2022 1637   MCHC 33.6 12/27/2016 1424   RDW 13.6 10/18/2022 1637   LYMPHSABS 1.9 04/30/2014 1246    MONOABS 1.9 (H) 04/30/2014 1246   EOSABS 0.0 04/30/2014 1246   BASOSABS 0.0 04/30/2014 1246    ASSESSMENT AND PLAN: 1. Essential hypertension (Primary) At goal.  Continue HCTZ 25 mg daily. - hydrochlorothiazide  (HYDRODIURIL ) 25 MG tablet; Take 1 tablet (25 mg total) by mouth daily.  Dispense: 90 tablet; Refill: 1 - CBC; Future - Comprehensive metabolic panel with GFR; Future  2. Mixed hyperlipidemia Continue atorvastatin  - Lipid panel; Future  3. Prediabetes See #4 below - POCT glycosylated hemoglobin (Hb A1C) - POCT glucose (manual entry)  4. Class 2 severe obesity due to excess calories with serious comorbidity and body mass index (BMI) of 38.0 to 38.9 in adult [facility]) Commended him on resuming his exercising 3 times a week by swimming.  Continue to encourage healthy eating habits.  He is agreeable to referral to nutritionist - Amb ref to Medical Nutrition Therapy-MNT  5. Pain of left forearm Most likely muscle strain.  He is requesting meloxicam .  Will give a  30-day supply.  Advised that he can also use IcyHot rub over-the-counter. - meloxicam  (MOBIC ) 15 MG tablet; Take 1 tablet (15 mg total) by mouth daily.  Dispense: 30 tablet; Refill: 0  6. Benign prostatic hyperplasia with lower urinary tract symptoms, symptom details unspecified Patient reports some weak urine stream/flow at times but not frequently.  We agreed to continue current dose of tamsulosin .  7. Prostate cancer screening Patient agreeable to and requesting screening. - PSA; Future  8. Dysuria Patient is not reporting dysuria per se but reports some intermittent stinging at the tip of the penis.  He declines STI screening but agrees to a urinalysis. - Urinalysis, Routine w reflex microscopic  9.  Former smoker  Commended him on quitting.  Encouraged him to remain tobacco free  Patient was given the opportunity to ask questions.  Patient verbalized understanding of the plan and was able to repeat key elements  of the plan.   This documentation was completed using Paediatric nurse.  Any transcriptional errors are unintentional.  Orders Placed This Encounter  Procedures   Urinalysis, Routine w reflex microscopic   CBC   Comprehensive metabolic panel with GFR   Lipid panel   PSA   Amb ref to Medical Nutrition Therapy-MNT   POCT glycosylated hemoglobin (Hb A1C)   POCT glucose (manual entry)     Requested Prescriptions   Signed Prescriptions Disp Refills   hydrochlorothiazide  (HYDRODIURIL ) 25 MG tablet 90 tablet 1    Sig: Take 1 tablet (25 mg total) by mouth daily.   meloxicam  (MOBIC ) 15 MG tablet 30 tablet 0    Sig: Take 1 tablet (15 mg total) by mouth daily.    Return in about 4 months (around 02/12/2024) for give lab appt this week or next week.  Barnie Louder, MD, FACP

## 2023-10-14 ENCOUNTER — Ambulatory Visit: Payer: Self-pay | Admitting: Internal Medicine

## 2023-10-14 LAB — URINALYSIS, ROUTINE W REFLEX MICROSCOPIC
Bilirubin, UA: NEGATIVE
Glucose, UA: NEGATIVE
Ketones, UA: NEGATIVE
Leukocytes,UA: NEGATIVE
Nitrite, UA: NEGATIVE
Protein,UA: NEGATIVE
RBC, UA: NEGATIVE
Specific Gravity, UA: 1.021 (ref 1.005–1.030)
Urobilinogen, Ur: 0.2 mg/dL (ref 0.2–1.0)
pH, UA: 5.5 (ref 5.0–7.5)

## 2023-10-17 ENCOUNTER — Ambulatory Visit: Attending: Internal Medicine

## 2023-10-17 DIAGNOSIS — I10 Essential (primary) hypertension: Secondary | ICD-10-CM

## 2023-10-17 DIAGNOSIS — Z125 Encounter for screening for malignant neoplasm of prostate: Secondary | ICD-10-CM

## 2023-10-17 DIAGNOSIS — E782 Mixed hyperlipidemia: Secondary | ICD-10-CM

## 2023-10-18 LAB — COMPREHENSIVE METABOLIC PANEL WITH GFR
ALT: 25 IU/L (ref 0–44)
AST: 20 IU/L (ref 0–40)
Albumin: 4.6 g/dL (ref 3.9–4.9)
Alkaline Phosphatase: 79 IU/L (ref 44–121)
BUN/Creatinine Ratio: 11 (ref 10–24)
BUN: 12 mg/dL (ref 8–27)
Bilirubin Total: 0.8 mg/dL (ref 0.0–1.2)
CO2: 24 mmol/L (ref 20–29)
Calcium: 10 mg/dL (ref 8.6–10.2)
Chloride: 97 mmol/L (ref 96–106)
Creatinine, Ser: 1.07 mg/dL (ref 0.76–1.27)
Globulin, Total: 2.9 g/dL (ref 1.5–4.5)
Glucose: 100 mg/dL — ABNORMAL HIGH (ref 70–99)
Potassium: 4.1 mmol/L (ref 3.5–5.2)
Sodium: 137 mmol/L (ref 134–144)
Total Protein: 7.5 g/dL (ref 6.0–8.5)
eGFR: 79 mL/min/1.73 (ref >59)

## 2023-10-18 LAB — CBC
Hematocrit: 48.1 % (ref 37.5–51.0)
Hemoglobin: 15.9 g/dL (ref 13.0–17.7)
MCH: 30.3 pg (ref 26.6–33.0)
MCHC: 33.1 g/dL (ref 31.5–35.7)
MCV: 92 fL (ref 79–97)
Platelets: 291 x10E3/uL (ref 150–450)
RBC: 5.24 x10E6/uL (ref 4.14–5.80)
RDW: 13.4 % (ref 11.6–15.4)
WBC: 6.1 x10E3/uL (ref 3.4–10.8)

## 2023-10-18 LAB — LIPID PANEL
Chol/HDL Ratio: 2.9 ratio (ref 0.0–5.0)
Cholesterol, Total: 134 mg/dL (ref 100–199)
HDL: 46 mg/dL (ref >39)
LDL Chol Calc (NIH): 70 mg/dL (ref 0–99)
Triglycerides: 97 mg/dL (ref 0–149)
VLDL Cholesterol Cal: 18 mg/dL (ref 5–40)

## 2023-10-18 LAB — PSA: Prostate Specific Ag, Serum: 0.4 ng/mL (ref 0.0–4.0)

## 2023-10-20 NOTE — Progress Notes (Deleted)
  Medical Nutrition Therapy  Appointment Start time:  ***  Appointment End time:  ***  Primary concerns today: ***  Referral diagnosis: Class 2 severe obesity due to excess calories with serious comorbidity and body mass index (BMI) of 38.0 to 38.9 in adult [facility])  Preferred learning style: *** (auditory, visual, hands on, no preference indicated) Learning readiness: *** (not ready, contemplating, ready, change in progress)   NUTRITION ASSESSMENT    Clinical Medical Hx: *** Medications: *** Labs:  Lab Results  Component Value Date   HGBA1C 5.9 10/13/2023   Lab Results  Component Value Date   CHOL 134 10/17/2023   HDL 46 10/17/2023   LDLCALC 70 10/17/2023   TRIG 97 10/17/2023   CHOLHDL 2.9 10/17/2023    Notable Signs/Symptoms: ***  Lifestyle & Dietary Hx ***  Estimated daily fluid intake: *** oz Supplements: *** Sleep: *** Stress / self-care: *** Current average weekly physical activity: ***  24-Hr Dietary Recall First Meal: *** Snack: *** Second Meal: *** Snack: *** Third Meal: *** Snack: *** Beverages: ***  Estimated Energy Needs Calories: *** Carbohydrate: ***g Protein: ***g Fat: ***g   NUTRITION DIAGNOSIS  {CHL AMB NUTRITIONAL DIAGNOSIS:[id number]}   NUTRITION INTERVENTION  Nutrition education (E-1) on the following topics:  ***  Handouts Provided Include  ***  Learning Style & Readiness for Change Teaching method utilized: Visual & Auditory  Demonstrated degree of understanding via: Teach Back  Barriers to learning/adherence to lifestyle change: ***  Goals Established by Pt ***   MONITORING & EVALUATION Dietary intake, weekly physical activity, and *** in ***.  Next Steps  Patient is to ***.

## 2023-10-31 ENCOUNTER — Ambulatory Visit: Admitting: Dietician

## 2023-10-31 ENCOUNTER — Other Ambulatory Visit: Payer: Self-pay | Admitting: Internal Medicine

## 2023-10-31 DIAGNOSIS — E66812 Obesity, class 2: Secondary | ICD-10-CM

## 2023-11-01 ENCOUNTER — Other Ambulatory Visit: Payer: Self-pay

## 2023-11-02 ENCOUNTER — Other Ambulatory Visit: Payer: Self-pay

## 2023-11-02 MED ORDER — ACYCLOVIR 400 MG PO TABS
400.0000 mg | ORAL_TABLET | Freq: Two times a day (BID) | ORAL | 1 refills | Status: AC
  Filled 2023-11-02: qty 180, 90d supply, fill #0
  Filled 2023-11-28 – 2024-01-29 (×2): qty 180, 90d supply, fill #1

## 2023-11-03 ENCOUNTER — Other Ambulatory Visit: Payer: Self-pay

## 2023-11-28 ENCOUNTER — Other Ambulatory Visit: Payer: Self-pay

## 2023-11-28 NOTE — Progress Notes (Deleted)
  Medical Nutrition Therapy  Appointment Start time:  ***  Appointment End time:  ***  Primary concerns today: ***  Referral diagnosis: Class 2 severe obesity due to excess calories with serious comorbidity and body mass index (BMI) of 38.0 to 38.9 in adult [facility])  Preferred learning style: *** (auditory, visual, hands on, no preference indicated) Learning readiness: *** (not ready, contemplating, ready, change in progress)   NUTRITION ASSESSMENT    Clinical Medical Hx: *** Medications: *** Labs:  Lab Results  Component Value Date   HGBA1C 5.9 10/13/2023   Lab Results  Component Value Date   CHOL 134 10/17/2023   HDL 46 10/17/2023   LDLCALC 70 10/17/2023   TRIG 97 10/17/2023   CHOLHDL 2.9 10/17/2023    Notable Signs/Symptoms: ***  Lifestyle & Dietary Hx ***  Estimated daily fluid intake: *** oz Supplements: *** Sleep: *** Stress / self-care: *** Current average weekly physical activity: ***  24-Hr Dietary Recall First Meal: *** Snack: *** Second Meal: *** Snack: *** Third Meal: *** Snack: *** Beverages: ***  Estimated Energy Needs Calories: *** Carbohydrate: ***g Protein: ***g Fat: ***g   NUTRITION DIAGNOSIS  {CHL AMB NUTRITIONAL DIAGNOSIS:[id number]}   NUTRITION INTERVENTION  Nutrition education (E-1) on the following topics:  ***  Handouts Provided Include  ***  Learning Style & Readiness for Change Teaching method utilized: Visual & Auditory  Demonstrated degree of understanding via: Teach Back  Barriers to learning/adherence to lifestyle change: ***  Goals Established by Pt ***   MONITORING & EVALUATION Dietary intake, weekly physical activity, and *** in ***.  Next Steps  Patient is to ***.

## 2023-12-01 ENCOUNTER — Other Ambulatory Visit: Payer: Self-pay

## 2023-12-02 ENCOUNTER — Encounter: Payer: Self-pay | Admitting: Internal Medicine

## 2023-12-05 ENCOUNTER — Ambulatory Visit: Admitting: Dietician

## 2023-12-05 ENCOUNTER — Other Ambulatory Visit: Payer: Self-pay | Admitting: Internal Medicine

## 2023-12-05 DIAGNOSIS — N400 Enlarged prostate without lower urinary tract symptoms: Secondary | ICD-10-CM

## 2023-12-05 DIAGNOSIS — E66812 Obesity, class 2: Secondary | ICD-10-CM

## 2023-12-05 DIAGNOSIS — G5602 Carpal tunnel syndrome, left upper limb: Secondary | ICD-10-CM

## 2023-12-22 ENCOUNTER — Other Ambulatory Visit: Payer: Self-pay

## 2023-12-22 MED ORDER — LINACLOTIDE 72 MCG PO CAPS
72.0000 ug | ORAL_CAPSULE | Freq: Every day | ORAL | 3 refills | Status: AC
  Filled 2023-12-22: qty 90, 90d supply, fill #0

## 2023-12-22 NOTE — Telephone Encounter (Signed)
 Linzess 72 mcg sent to pharmacy.   May need PA.

## 2023-12-23 ENCOUNTER — Other Ambulatory Visit: Payer: Self-pay

## 2023-12-23 NOTE — Telephone Encounter (Signed)
 Pt made aware in MyChart of Linzess being sent to his pharmacy

## 2023-12-24 ENCOUNTER — Other Ambulatory Visit: Payer: Self-pay

## 2023-12-26 ENCOUNTER — Other Ambulatory Visit: Payer: Self-pay

## 2023-12-29 ENCOUNTER — Encounter: Payer: Self-pay | Admitting: Radiology

## 2023-12-29 ENCOUNTER — Ambulatory Visit: Admitting: Orthopedic Surgery

## 2023-12-30 ENCOUNTER — Encounter: Payer: Self-pay | Admitting: Internal Medicine

## 2023-12-30 ENCOUNTER — Telehealth: Payer: Self-pay | Admitting: Internal Medicine

## 2023-12-30 NOTE — Telephone Encounter (Signed)
 Patient came in the office today and picked up the letter.

## 2023-12-30 NOTE — Telephone Encounter (Signed)
 I wrote letter for pt. Please leave at front desk for him to pick up.

## 2023-12-31 ENCOUNTER — Telehealth: Payer: Self-pay

## 2023-12-31 NOTE — Telephone Encounter (Signed)
 Noted! Thank you

## 2023-12-31 NOTE — Telephone Encounter (Signed)
 PA done on Cover My Meds for Linzess 72mcg. Dx used:K59.00. pt approved through 12/30/2024. Will scan to the pt's chart

## 2024-01-01 ENCOUNTER — Other Ambulatory Visit: Payer: Self-pay

## 2024-01-09 NOTE — Progress Notes (Deleted)
 Medical Nutrition Therapy  Appointment Start time:  ***  Appointment End time:  ***  Primary concerns today: ***  Referral diagnosis: class 2 obesity Preferred learning style: *** (auditory, visual, hands on, no preference indicated) Learning readiness: *** (not ready, contemplating, ready, change in progress)   NUTRITION ASSESSMENT    Clinical Medical Hx: *** Medications: *** Labs: *** Notable Signs/Symptoms: ***  Lifestyle & Dietary Hx ***  Estimated daily fluid intake: *** oz Supplements: *** Sleep: *** Stress / self-care: *** Current average weekly physical activity: ***  24-Hr Dietary Recall First Meal: *** Snack: *** Second Meal: *** Snack: *** Third Meal: *** Snack: *** Beverages: ***  Estimated Energy Needs Calories: *** Carbohydrate: ***g Protein: ***g Fat: ***g   NUTRITION DIAGNOSIS  {CHL AMB NUTRITIONAL DIAGNOSIS:[id number]}   NUTRITION INTERVENTION  Nutrition education (E-1) on the following topics:  ***  Handouts Provided Include  ***  Learning Style & Readiness for Change Teaching method utilized: Visual & Auditory  Demonstrated degree of understanding via: Teach Back  Barriers to learning/adherence to lifestyle change: ***  Goals Established by Pt ***   MONITORING & EVALUATION Dietary intake, weekly physical activity, and *** in ***.  Next Steps  Patient is to ***.

## 2024-01-11 NOTE — Progress Notes (Unsigned)
 Samuel Irwin - 63 y.o. male MRN [id number]  Date of birth: 1960/05/10  Office Visit Note: Visit Date: 01/12/2024 PCP: Vicci Barnie NOVAK, MD Referred by: Vicci Barnie NOVAK, MD  Subjective: No chief complaint on file.  HPI: Samuel Irwin is a pleasant 63 y.o. male who presents today for evaluation of left wrist radial sided pain that radiates into the thumb and proximal forearm region.  States that it is related mostly to heavy pinch and grip activities.  Does have history notable for previous carpal tunnel release done in 2017 by Dr. Sissy.  Does not have significant numbness and tingling currently.  He has been seen previously at an outside facility for index trigger digit that underwent previous injection.  Pertinent ROS were reviewed with the patient and found to be negative unless otherwise specified above in HPI.   Visit Reason: Left wrist de Quervain's tenosynovitis Duration of symptoms: 1+ year Hand dominance: right Occupation: Media Planner  Diabetic: No Smoking: Yes Heart/Lung History: none Blood Thinners: none  Prior Testing/EMG:  none Injections (Date): Treatments: none Prior Surgery: [facility]    Assessment & Plan: Visit Diagnoses:  1. De Quervain's tenosynovitis, left     Plan: Extensive discussion was had with the patient today regarding his left wrist radial sided pain.  I reviewed in detail his previous records and evaluations.  At today's visit, patient has signs and symptoms consistent with de Quervain's tenosynovitis.  We discussed the underlying etiology and pathophysiology of this condition as well as treatment modalities ranging from conservative to surgical.  From a conservative standpoint, we discussed activity modification, anti-inflammatory medication both topical and oral, bracing, therapy and cortisone injections.  From a surgical standpoint, I explained to patient that we can perform left wrist first extensor compartment release  should symptoms remain affected conservative care.  For the time being, he would like to pursue left wrist first extensor compartment injection which is appropriate.  Risks and benefits of the injection were discussed, patient elected to proceed.  He was also fitted for a left wrist Comfort Cool brace today.  Follow-up in approximately 6 weeks for recheck.  I spent 30 minutes in the care of this patient today including review of previous documentation, imaging obtained, face-to-face time discussing all options regarding treatment and documenting the encounter.    Follow-up: No follow-ups on file.   Meds & Orders: No orders of the defined types were placed in this encounter.   Orders Placed This Encounter  Procedures   Hand/UE Inj     Procedures: Hand/UE Inj: L extensor compartment 1 for de Quervain's tenosynovitis on 01/12/2024 9:04 PM Indications: pain Details: 22 G needle Medications: 1 mL lidocaine  1 %; 6 mg betamethasone  acetate-betamethasone  sodium phosphate  6 (3-3) MG/ML Outcome: tolerated well, no immediate complications Procedure, treatment alternatives, risks and benefits explained, specific risks discussed. Consent was given by the patient.          Clinical History: No specialty comments available.  He reports that he has been smoking cigarettes. He has a 10.8 pack-year smoking history. He has never used smokeless tobacco.  Recent Labs    10/13/23 1629  HGBA1C 5.9    Objective:   Vital Signs: There were no vitals taken for this visit.  Physical Exam  Gen: Well-appearing, in no acute distress; non-toxic CV: Regular Rate. Well-perfused. Warm.  Resp: Breathing unlabored on room air; no wheezing. Psych: Fluid speech in conversation; appropriate affect; normal thought process  Ortho Exam General:  Patient is well appearing and in no distress.   Skin and Muscle: Prior well-healed carpal tunnel incision left palm.  Muscle bulk and contour normal, no signs of  atrophy.     Range of Motion and Palpation Tests: Mobility is full about the elbows with flexion and extension.  Forearm supination and pronation are 85/85 bilaterally.  Wrist flexion/extension is 75/65 right side, wrist flexion/extension limited secondary to pain left side, approximately 65/55.  Digital flexion and extension are full.    No cords or nodules are palpated.  No triggering is observed.   Finklestein test is positive left side, significant pain with associated swelling and tenderness.    Neurologic, Vascular, Motor: Sensation is intact to light touch in the median/radial/ulnar distributions.   Fingers pink and well perfused.  Capillary refill is brisk.    Imaging: No results found.  Past Medical/Family/Surgical/Social History: Medications & Allergies reviewed per EMR, new medications updated. Patient Active Problem List   Diagnosis Date Noted   Anal fissure 05/01/2023   Right lower quadrant abdominal pain 09/29/2020   Primary osteoarthritis, right shoulder 09/10/2020   Chronic pain of both shoulders 12/07/2019   Prediabetes 09/21/2019   Paranoid state (HCC) 09/20/2019   Tobacco use 02/10/2019   Chronic cough 12/29/2018   Other headache syndrome 02/12/2018   Arthritis pain of hand 02/12/2018   Scrotal mass 08/04/2017   Obesity (BMI 35.0-39.9 without comorbidity) 03/10/2017   Burning sensation of feet 09/23/2016   Floaters in visual field, right 09/23/2016   Osteoarthritis of knees, bilateral 02/28/2016   Chronic bilateral low back pain 02/28/2016   Poor dentition 01/29/2016   Essential hypertension 06/06/2015   BPH (benign prostatic hyperplasia) 05/04/2015   Other seasonal allergic rhinitis 05/04/2015   Target of perceived adverse discrimination or persecution 04/05/2015   Hemorrhoids 02/23/2015   Constipation 02/23/2015   GERD (gastroesophageal reflux disease) 09/21/2014   Carpal tunnel syndrome 08/12/2014   Erectile dysfunction 08/20/2013   Hyperlipidemia  08/20/2013   OSA (obstructive sleep apnea) 08/24/2012   Past Medical History:  Diagnosis Date   Constipation    Cough    Generalized headaches    GERD (gastroesophageal reflux disease)    H/O hiatal hernia    Heart murmur    Herpes    Hypertension    Pre-diabetes    Sleep apnea    uses BIPAP   Umbilical hernia    Weakness    Wheezing    Family History  Problem Relation Age of Onset   Hypertension Mother    Cancer Father        lung   Colon cancer Neg Hx    Past Surgical History:  Procedure Laterality Date   BIOPSY  10/18/2014   Procedure: BIOPSY (Gastric);  Surgeon: Margo LITTIE Haddock, MD;  Location: [facility];  Service: Endoscopy;;   CARPAL TUNNEL RELEASE     R hand   COLONOSCOPY WITH PROPOFOL  N/A 10/18/2014   DOQ:fnizmjuz size external hemorrhoids/left colon is redundant   COLONOSCOPY WITH PROPOFOL  N/A 02/14/2023   Procedure: COLONOSCOPY WITH PROPOFOL ;  Surgeon: Cindie Carlin POUR, DO;  Location: [facility];  Service: Endoscopy;  Laterality: N/A;  2:30 pm, asa 2   ESOPHAGOGASTRODUODENOSCOPY (EGD) WITH PROPOFOL  N/A 10/18/2014   SLF: epigastric pain due to moderate NSAID gastritis   FEMUR SURGERY     left   HERNIA REPAIR  04/07/12   umb hernia repair   INSERTION OF MESH N/A 04/07/2012   Procedure: INSERTION OF MESH;  Surgeon: Redell  Elvin, DO;  Location: [facility];  Service: General;  Laterality: N/A;   KNEE ARTHROSCOPY     right   MINOR CARPAL TUNNEL Left 01/31/2016   Procedure: MINOR CARPAL TUNNEL RELEASE LEFT;  Surgeon: Donnice Robinsons, MD;  Location: [facility];  Service: Orthopedics;  Laterality: Left;  Local   POLYPECTOMY  02/14/2023   Procedure: POLYPECTOMY;  Surgeon: Cindie Carlin POUR, DO;  Location: [facility];  Service: Endoscopy;;   TOE SURGERY     dislocated lt foot   UMBILICAL HERNIA REPAIR N/A 04/07/2012   Procedure: HERNIA REPAIR UMBILICAL ADULT;  Surgeon: Redell Elvin, DO;  Location: [facility];  Service: General;  Laterality: N/A;  open umbilical  hernia repair with mesh   Social History   Occupational History   Occupation: unemployed  Tobacco Use   Smoking status: Every Day    Current packs/day: 0.25    Average packs/day: 0.3 packs/day for 43.0 years (10.8 ttl pk-yrs)    Types: Cigarettes   Smokeless tobacco: Never   Tobacco comments:    occas still smokes when he drinks  Vaping Use   Vaping status: Never Used  Substance and Sexual Activity   Alcohol use: Yes    Alcohol/week: 4.0 standard drinks of alcohol    Types: 4 Cans of beer per week    Comment: once weekly   Drug use: Not Currently    Types: Cocaine, Crack cocaine    Comment: smoked crack--last used 5-15yrs   Sexual activity: Not on file    Comment: when I drink    Kayshaun Polanco Estela) Arlinda, M.D. [facility], Hand Surgery

## 2024-01-12 ENCOUNTER — Ambulatory Visit (INDEPENDENT_AMBULATORY_CARE_PROVIDER_SITE_OTHER): Admitting: Orthopedic Surgery

## 2024-01-12 DIAGNOSIS — M654 Radial styloid tenosynovitis [de Quervain]: Secondary | ICD-10-CM

## 2024-01-12 MED ORDER — LIDOCAINE HCL 1 % IJ SOLN
1.0000 mL | INTRAMUSCULAR | Status: AC | PRN
  Administered 2024-01-12: 1 mL

## 2024-01-12 MED ORDER — BETAMETHASONE SOD PHOS & ACET 6 (3-3) MG/ML IJ SUSP
6.0000 mg | INTRAMUSCULAR | Status: AC | PRN
  Administered 2024-01-12: 6 mg via INTRA_ARTICULAR

## 2024-01-14 ENCOUNTER — Ambulatory Visit: Admitting: Dietician

## 2024-01-14 DIAGNOSIS — E66812 Obesity, class 2: Secondary | ICD-10-CM

## 2024-01-19 ENCOUNTER — Other Ambulatory Visit: Payer: Self-pay

## 2024-01-19 ENCOUNTER — Other Ambulatory Visit: Payer: Self-pay | Admitting: Internal Medicine

## 2024-01-19 DIAGNOSIS — E782 Mixed hyperlipidemia: Secondary | ICD-10-CM

## 2024-01-19 DIAGNOSIS — N400 Enlarged prostate without lower urinary tract symptoms: Secondary | ICD-10-CM

## 2024-01-19 MED ORDER — ATORVASTATIN CALCIUM 20 MG PO TABS
20.0000 mg | ORAL_TABLET | Freq: Every day | ORAL | 2 refills | Status: AC
  Filled 2024-01-19: qty 30, 30d supply, fill #0
  Filled 2024-01-29 – 2024-02-13 (×2): qty 30, 30d supply, fill #1
  Filled 2024-03-15: qty 30, 30d supply, fill #2

## 2024-01-19 MED ORDER — TAMSULOSIN HCL 0.4 MG PO CAPS
0.4000 mg | ORAL_CAPSULE | Freq: Every day | ORAL | 2 refills | Status: AC
  Filled 2024-01-19: qty 30, 30d supply, fill #0
  Filled 2024-02-13: qty 30, 30d supply, fill #1
  Filled 2024-03-15: qty 30, 30d supply, fill #2

## 2024-01-20 ENCOUNTER — Encounter: Payer: Self-pay | Admitting: Gastroenterology

## 2024-01-29 ENCOUNTER — Other Ambulatory Visit: Payer: Self-pay

## 2024-02-13 ENCOUNTER — Other Ambulatory Visit: Payer: Self-pay

## 2024-02-13 ENCOUNTER — Other Ambulatory Visit: Payer: Self-pay | Admitting: Internal Medicine

## 2024-02-13 DIAGNOSIS — I10 Essential (primary) hypertension: Secondary | ICD-10-CM

## 2024-02-13 MED ORDER — HYDROCHLOROTHIAZIDE 25 MG PO TABS
25.0000 mg | ORAL_TABLET | Freq: Every day | ORAL | 1 refills | Status: AC
  Filled 2024-02-13 – 2024-03-15 (×2): qty 90, 90d supply, fill #0

## 2024-02-16 ENCOUNTER — Other Ambulatory Visit: Payer: Self-pay

## 2024-02-23 NOTE — Progress Notes (Unsigned)
 Follow up left first extensor compartment Injection 01/12/24

## 2024-02-24 ENCOUNTER — Ambulatory Visit: Admitting: Orthopedic Surgery

## 2024-02-24 DIAGNOSIS — G5602 Carpal tunnel syndrome, left upper limb: Secondary | ICD-10-CM

## 2024-02-24 DIAGNOSIS — M654 Radial styloid tenosynovitis [de Quervain]: Secondary | ICD-10-CM | POA: Diagnosis not present

## 2024-03-05 ENCOUNTER — Other Ambulatory Visit: Payer: Self-pay

## 2024-03-05 ENCOUNTER — Ambulatory Visit: Payer: Self-pay | Attending: Internal Medicine | Admitting: Internal Medicine

## 2024-03-05 VITALS — BP 108/70 | HR 68 | Temp 98.0°F | Ht 68.0 in | Wt 247.0 lb

## 2024-03-05 DIAGNOSIS — Z6837 Body mass index (BMI) 37.0-37.9, adult: Secondary | ICD-10-CM | POA: Diagnosis not present

## 2024-03-05 DIAGNOSIS — G4489 Other headache syndrome: Secondary | ICD-10-CM | POA: Diagnosis not present

## 2024-03-05 DIAGNOSIS — M545 Low back pain, unspecified: Secondary | ICD-10-CM | POA: Diagnosis not present

## 2024-03-05 DIAGNOSIS — I10 Essential (primary) hypertension: Secondary | ICD-10-CM | POA: Diagnosis not present

## 2024-03-05 DIAGNOSIS — E782 Mixed hyperlipidemia: Secondary | ICD-10-CM

## 2024-03-05 DIAGNOSIS — Z23 Encounter for immunization: Secondary | ICD-10-CM

## 2024-03-05 DIAGNOSIS — F17210 Nicotine dependence, cigarettes, uncomplicated: Secondary | ICD-10-CM

## 2024-03-05 MED ORDER — TOPIRAMATE 25 MG PO TABS
25.0000 mg | ORAL_TABLET | Freq: Every day | ORAL | 1 refills | Status: AC
  Filled 2024-03-05: qty 30, 30d supply, fill #0

## 2024-03-05 MED ORDER — UBRELVY 50 MG PO TABS
ORAL_TABLET | ORAL | 1 refills | Status: AC
  Filled 2024-03-05: qty 10, 30d supply, fill #0
  Filled 2024-03-15: qty 10, 5d supply, fill #0
  Filled 2024-03-16: qty 10, 30d supply, fill #0

## 2024-03-05 NOTE — Progress Notes (Unsigned)
 "   Patient ID: Samuel Irwin, male    DOB: 12/06/1960  MRN: [id number]  CC: Hypertension (HTN f/u. /Intermittent headaches X1 yr Janiece to flu vax)   Subjective: Samuel Irwin is a 64 y.o. male who presents for chronic ds management. His chronic medical issues include:  [name], HL, [name], GERD, ED, tob dep, BPH, OSA on Bipap, Vit D def, dep/anxiety, paranoia (see 08/2017 visit), OA knee,   Discussed the use of AI scribe software for clinical note transcription with the patient, who gave verbal consent to proceed.  History of Present Illness Samuel Irwin is a 64 year old male who presents with chronic issues and headaches.  He has a history of hypertension, managed with hydrochlorothiazide  25 mg daily. He is mindful of his salt intake, though he occasionally consumes chips. His hyperlipidemia is managed with atorvastatin  20 mg daily.  He has experienced weight fluctuations, currently weighing 247 pounds, down from 253 pounds in August. He attributes recent weight gain to knee and back pain, which have limited his physical activity. He has a history of scoliosis in the lower back, contributing to his back issues, and has experienced episodes where his back 'went out,' possibly related to dehydration. He tries to swim regularly but is cautious due to back pain.  He has a history of smoking and drinking, having quit both but recently resumed on weekends. He consumes about three to four 16-ounce beers on Fridays and smokes on weekends, with plans to quit smoking after his upcoming birthday.  He experiences frequent headaches two to three times a week, lasting about two hours. The headaches are dull, can be triggered by smoking and drinking, but also occur independently. They can be unilateral or at the back of the head, without nausea, vomiting, or light sensitivity. He uses BC powder and ibuprofen  for relief, with varying effectiveness.  He has a history of carpal  tunnel syndrome, with surgeries on both hands. 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  He mentions dental issues, requiring four crowns, and is currently seeing a dentist in [place].    Patient Active Problem List   Diagnosis Date Noted   Anal fissure 05/01/2023   Right lower quadrant abdominal pain 09/29/2020   Primary osteoarthritis, right shoulder 09/10/2020   Chronic pain of both shoulders 12/07/2019   Prediabetes 09/21/2019   Paranoid state (HCC) 09/20/2019   Tobacco use 02/10/2019   Chronic cough 12/29/2018   Other headache syndrome 02/12/2018   Arthritis pain of hand 02/12/2018   Scrotal mass 08/04/2017   Obesity (BMI 35.0-39.9 without comorbidity) 03/10/2017   Burning sensation of feet 09/23/2016   Floaters in visual field, right 09/23/2016   Osteoarthritis of knees, bilateral 02/28/2016   Chronic bilateral low back pain 02/28/2016   Poor dentition 01/29/2016   Essential hypertension 06/06/2015   BPH (benign prostatic hyperplasia) 05/04/2015   Other seasonal allergic rhinitis 05/04/2015   Target of perceived adverse discrimination or persecution 04/05/2015   Hemorrhoids 02/23/2015   Constipation 02/23/2015   GERD (gastroesophageal reflux disease) 09/21/2014   Carpal tunnel syndrome 08/12/2014   Erectile dysfunction 08/20/2013   Hyperlipidemia 08/20/2013   OSA (obstructive sleep apnea) 08/24/2012     Medications Ordered Prior to Encounter[1]  Allergies[2]  Social History   Socioeconomic History   Marital status: Single    Spouse name: Not on file   Number of children: Not on file   Years of education:  Not on file   Highest education level: Associate degree: occupational, technical, or vocational program  Occupational History   Occupation: unemployed  Tobacco Use   Smoking status: Every Day    Current packs/day: 0.25    Average packs/day: 0.3 packs/day for 43.0 years (10.8 ttl pk-yrs)    Types:  Cigarettes   Smokeless tobacco: Never   Tobacco comments:    occas still smokes when he drinks  Vaping Use   Vaping status: Never Used  Substance and Sexual Activity   Alcohol use: Yes    Alcohol/week: 4.0 standard drinks of alcohol    Types: 4 Cans of beer per week    Comment: once weekly   Drug use: Not Currently    Types: Cocaine, Crack cocaine    Comment: smoked crack--last used 5-29yrs   Sexual activity: Not on file    Comment: when I drink  Other Topics Concern   Not on file  Social History Narrative   Not on file   Social Drivers of Health   Tobacco Use: High Risk (08/07/2023)   Patient History    Smoking Tobacco Use: Every Day    Smokeless Tobacco Use: Never    Passive Exposure: Not on file  Financial Resource Strain: Low Risk (10/13/2023)   Overall Financial Resource Strain (CARDIA)    Difficulty of Paying Living Expenses: Not very hard  Food Insecurity: No Food Insecurity (10/13/2023)   Epic    Worried About Programme Researcher, Broadcasting/film/video in the Last Year: Never true    Ran Out of Food in the Last Year: Never true  Transportation Needs: No Transportation Needs (10/13/2023)   Epic    Lack of Transportation (Medical): No    Lack of Transportation (Non-Medical): No  Physical Activity: Sufficiently Active (10/13/2023)   Exercise Vital Sign    Days of Exercise per Week: 3 days    Minutes of Exercise per Session: 90 min  Stress: No Stress Concern Present (10/13/2023)   [employer] of Occupational Health - Occupational Stress Questionnaire    Feeling of Stress: Not at all  Social Connections: Moderately Isolated (10/13/2023)   Social Connection and Isolation Panel    Frequency of Communication with Friends and Family: Once a week    Frequency of Social Gatherings with Friends and Family: More than three times a week    Attends Religious Services: Never    Database Administrator or Organizations: No    Attends Banker Meetings: Never    Marital Status:  Living with partner  Intimate Partner Violence: Not At Risk (10/13/2023)   Epic    Fear of Current or Ex-Partner: No    Emotionally Abused: No    Physically Abused: No    Sexually Abused: No  Depression (PHQ2-9): Low Risk (10/13/2023)   Depression (PHQ2-9)    PHQ-2 Score: 4  Alcohol Screen: Low Risk (10/13/2023)   Alcohol Screen    Last Alcohol Screening Score (AUDIT): 0  Housing: Low Risk (10/13/2023)   Epic    Unable to Pay for Housing in the Last Year: No    Number of Times Moved in the Last Year: 0    Homeless in the Last Year: No  Utilities: Not At Risk (10/13/2023)   Epic    Threatened with loss of utilities: No  Health Literacy: Adequate Health Literacy (10/13/2023)   B1300 Health Literacy    Frequency of need for help with medical instructions: Never    Family History  Problem  Relation Age of Onset   Hypertension Mother    Cancer Father        lung   Colon cancer Neg Hx     Past Surgical History:  Procedure Laterality Date   BIOPSY  10/18/2014   Procedure: BIOPSY (Gastric);  Surgeon: Margo LITTIE Haddock, MD;  Location: [facility];  Service: Endoscopy;;   CARPAL TUNNEL RELEASE     R hand   COLONOSCOPY WITH PROPOFOL  N/A 10/18/2014   DOQ:fnizmjuz size external hemorrhoids/left colon is redundant   COLONOSCOPY WITH PROPOFOL  N/A 02/14/2023   Procedure: COLONOSCOPY WITH PROPOFOL ;  Surgeon: Cindie Carlin POUR, DO;  Location: [facility];  Service: Endoscopy;  Laterality: N/A;  2:30 pm, asa 2   ESOPHAGOGASTRODUODENOSCOPY (EGD) WITH PROPOFOL  N/A 10/18/2014   SLF: epigastric pain due to moderate NSAID gastritis   FEMUR SURGERY     left   HERNIA REPAIR  04/07/12   umb hernia repair   INSERTION OF MESH N/A 04/07/2012   Procedure: INSERTION OF MESH;  Surgeon: Redell Faith, DO;  Location: [facility];  Service: General;  Laterality: N/A;   KNEE ARTHROSCOPY     right   MINOR CARPAL TUNNEL Left 01/31/2016   Procedure: MINOR CARPAL TUNNEL RELEASE LEFT;  Surgeon: Donnice Robinsons, MD;  Location:  [facility];  Service: Orthopedics;  Laterality: Left;  Local   POLYPECTOMY  02/14/2023   Procedure: POLYPECTOMY;  Surgeon: Cindie Carlin POUR, DO;  [facility]: [facility];  Service: Endoscopy;;   TOE SURGERY     dislocated lt foot   UMBILICAL HERNIA REPAIR N/A 04/07/2012   Procedure: HERNIA REPAIR UMBILICAL ADULT;  Surgeon: Redell Faith, DO;  Location: [facility];  Service: General;  Laterality: N/A;  open umbilical hernia repair with mesh    ROS: Review of Systems Negative except as stated above  PHYSICAL EXAM: BP 108/70 (BP Location: Left Arm, Patient Position: Sitting, Cuff Size: Large)   Pulse 68   Temp 98 F (36.7 C) (Oral)   Ht 5' 8 (1.727 m)   Wt 247 lb (112 kg)   SpO2 94%   BMI 37.56 kg/m   Wt Readings from Last 3 Encounters:  03/05/24 247 lb (112 kg)  10/13/23 253 lb (114.8 kg)  08/07/23 256 lb (116.1 kg)    Physical Exam   {male adult master:[id number]}     Latest Ref Rng & Units 10/17/2023    4:30 PM 10/18/2022    4:37 PM 10/16/2022    8:40 AM  CMP  Glucose 70 - 99 mg/dL 899  97  894   BUN 8 - 27 mg/dL 12  11  14    Creatinine 0.76 - 1.27 mg/dL 8.92  8.99  9.08   Sodium 134 - 144 mmol/L 137  138  136   Potassium 3.5 - 5.2 mmol/L 4.1  3.8  3.4   Chloride 96 - 106 mmol/L 97  98  102   CO2 20 - 29 mmol/L 24  24  26    Calcium  8.6 - 10.2 mg/dL 89.9  89.9  9.3   Total Protein 6.0 - 8.5 g/dL 7.5  7.8    Total Bilirubin 0.0 - 1.2 mg/dL 0.8  0.9    Alkaline Phos 44 - 121 IU/L 79  82    AST 0 - 40 IU/L 20  19    ALT 0 - 44 IU/L 25  26     Lipid Panel     Component Value Date/Time   CHOL 134 10/17/2023  1630   TRIG 97 10/17/2023 1630   HDL 46 10/17/2023 1630   CHOLHDL 2.9 10/17/2023 1630   CHOLHDL 4.2 03/09/2014 1241   VLDL 36 03/09/2014 1241   LDLCALC 70 10/17/2023 1630    CBC    Component Value Date/Time   WBC 6.1 10/17/2023 1630   WBC 5.7 12/27/2016 1424   RBC 5.24 10/17/2023 1630   RBC 4.92 12/27/2016 1424   HGB 15.9 10/17/2023 1630   HCT  48.1 10/17/2023 1630   PLT 291 10/17/2023 1630   MCV 92 10/17/2023 1630   MCH 30.3 10/17/2023 1630   MCH 30.5 12/27/2016 1424   MCHC 33.1 10/17/2023 1630   MCHC 33.6 12/27/2016 1424   RDW 13.4 10/17/2023 1630   LYMPHSABS 1.9 04/30/2014 1246   MONOABS 1.9 (H) 04/30/2014 1246   EOSABS 0.0 04/30/2014 1246   BASOSABS 0.0 04/30/2014 1246    ASSESSMENT AND PLAN:  Assessment and Plan Assessment & Plan Chronic recurrent headache, probable migraine Chronic headaches, likely migraine, occurring 2-3 times weekly. No nausea, vomiting, or photophobia. Stress and dehydration possible triggers. Previous CT. - Ordered CT head to rule out intracranial pathology. - Prescribed Ubrelvy  for acute migraine, max two tablets in 24 hours. - Prescribed Topamax  for prophylaxis, taken at bedtime. - Referred to neurology for further evaluation.  Essential hypertension Blood pressure controlled on hydrochlorothiazide . - Continue hydrochlorothiazide  25 mg daily.  Mixed hyperlipidemia Cholesterol controlled on atorvastatin . - Continue atorvastatin  20 mg daily.  Obesity Weight decreased from 253 lbs to 247 lbs. Plans to resume swimming and improve diet. - Encouraged swimming and regular physical activity. - Advised on dietary modifications for weight loss.  Nicotine  dependence, cigarettes Resumed weekend smoking. Plans to quit after birthday. - Encouraged smoking cessation after birthday.  Alcohol use, episodic Consumes four 16-ounce beers on Fridays. - Advised to limit alcohol intake to no more than two drinks per occasion.  Lumbar scoliosis Mild scoliosis with leftward shift. Back pain exacerbated by activity and dehydration. - Advised on proper body mechanics during activities.  Carpal tunnel syndrome, bilateral Further intervention may be needed.     1. Need for immunization against influenza (Primary) *** - Flu vaccine trivalent PF, 6mos and  older(Flulaval,Afluria,Fluarix,Fluzone)    Patient was given the opportunity to ask questions.  Patient verbalized understanding of the plan and was able to repeat key elements of the plan.   This documentation was completed using Paediatric nurse.  Any transcriptional errors are unintentional.  Orders Placed This Encounter  Procedures   Flu vaccine trivalent PF, 6mos and older(Flulaval,Afluria,Fluarix,Fluzone)     Requested Prescriptions   Pending Prescriptions Disp Refills   acyclovir  (ZOVIRAX ) 400 MG tablet 180 tablet 1    Sig: Take 1 tablet (400 mg total) by mouth 2 (two) times daily.    No follow-ups on file.  Barnie Louder, MD, FACP    [1]  Current Outpatient Medications on File Prior to Visit  Medication Sig Dispense Refill   acyclovir  (ZOVIRAX ) 400 MG tablet Take 1 tablet (400 mg total) by mouth 2 (two) times daily. 180 tablet 1   atorvastatin  (LIPITOR) 20 MG tablet Take 1 tablet (20 mg total) by mouth daily. 30 tablet 2   azelastine  (ASTELIN ) 0.1 % nasal spray Place 1 spray into both nostrils 2 (two) times daily. Use in each nostril as directed 30 mL 1   fluticasone  (FLONASE ) 50 MCG/ACT nasal spray PLACE 2 SPRAYS INTO BOTH NOSTRILS DAILY. 16 g 2   hydrochlorothiazide  (HYDRODIURIL ) 25  MG tablet Take 1 tablet (25 mg total) by mouth daily. 90 tablet 1   ibuprofen  (ADVIL ) 600 MG tablet Take 1 tablet (600 mg total) by mouth every 6 (six) hours as needed. 30 tablet 0   linaclotide  (LINZESS ) 72 MCG capsule Take 1 capsule (72 mcg total) by mouth daily before breakfast. 90 capsule 3   meloxicam  (MOBIC ) 15 MG tablet Take 1 tablet (15 mg total) by mouth daily. 30 tablet 0   promethazine -dextromethorphan (PROMETHAZINE -DM) 6.25-15 MG/5ML syrup Take 5 mLs by mouth 4 (four) times daily as needed for cough. 118 mL 0   tadalafil  (CIALIS ) 20 MG tablet Take 1 tablet (20 mg total) by mouth daily as needed for erectile dysfunction. 10 tablet 3   tamsulosin  (FLOMAX ) 0.4  MG CAPS capsule Take 1 capsule (0.4 mg total) by mouth daily.Must have office visit for refills. 30 capsule 2   cyclobenzaprine  (FLEXERIL ) 5 MG tablet Take 1 tablet (5 mg total) by mouth daily as needed for muscle spasms. (Patient not taking: Reported on 03/05/2024) 20 tablet 0   omeprazole  (PRILOSEC) 20 MG capsule Take 1 capsule (20 mg total) by mouth as needed (heartburn). (Patient not taking: Reported on 03/05/2024) 30 capsule 3   oxyCODONE -acetaminophen  (PERCOCET/ROXICET) 5-325 MG tablet 1-2 tablets, by mouth, every 4-6 hours as needed for pain. NOT TO EXCEED 6 tablets a day. (Patient not taking: Reported on 03/05/2024) 25 tablet 0   No current facility-administered medications on file prior to visit.  [2] No Known Allergies  "

## 2024-03-05 NOTE — Patient Instructions (Signed)
" °  VISIT SUMMARY: During your visit, we discussed your chronic headaches, hypertension, hyperlipidemia, weight fluctuations, smoking and drinking habits, back pain, and carpal tunnel syndrome. We reviewed your current medications and made some adjustments to better manage your conditions.  YOUR PLAN: -CHRONIC RECURRENT HEADACHE, PROBABLE MIGRAINE: You experience frequent headaches, likely migraines, occurring 2-3 times a week. Migraines are a type of headache that can cause severe pain and other symptoms. We have ordered a CT scan of your head to rule out any serious issues. You have been prescribed Ubrelvy  for acute migraine relief and Topamax  to prevent migraines. You are also referred to a neurologist for further evaluation.  -ESSENTIAL HYPERTENSION: Your blood pressure is well-controlled with your current medication, hydrochlorothiazide  25 mg daily. Hypertension is high blood pressure, which can lead to serious health problems if not managed. Continue taking your medication as prescribed.  -MIXED HYPERLIPIDEMIA: Your cholesterol levels are well-controlled with atorvastatin  20 mg daily. Hyperlipidemia means you have high levels of fats in your blood, which can increase your risk of heart disease. Continue taking your medication as prescribed.  -OBESITY: Your weight has decreased from 253 lbs to 247 lbs. Obesity means having excess body fat, which can lead to various health issues. Continue swimming and engaging in regular physical activity, and follow the dietary modifications we discussed to help with weight loss.  -NICOTINE  DEPENDENCE, CIGARETTES: You have resumed smoking on weekends but plan to quit after your birthday. Nicotine  dependence means you are addicted to the nicotine  in cigarettes. We encourage you to follow through with your plan to quit smoking.  -ALCOHOL USE, EPISODIC: You consume four 16-ounce beers on Fridays. Episodic alcohol use means drinking large amounts occasionally. We  advise you to limit your alcohol intake to no more than two drinks per occasion.  -LUMBAR SCOLIOSIS: You have mild scoliosis in your lower back, which causes pain, especially with activity and dehydration. Scoliosis is a curvature of the spine. We advise you to use proper body mechanics during activities to help manage your back pain.  -CARPAL TUNNEL SYNDROME, BILATERAL: You have a history of carpal tunnel syndrome and may need further intervention. Carpal tunnel syndrome is a condition that causes pain, numbness, and tingling in the hand and arm. We will monitor your condition and consider additional treatment if necessary.  INSTRUCTIONS: Please follow up with the neurologist as referred for your chronic headaches. Continue taking your medications as prescribed and adhere to the lifestyle recommendations we discussed. If you experience any new or worsening symptoms, please contact our office.                      Contains text generated by [employer]bridge.                                 Contains text generated by [employer]bridge.   "

## 2024-03-06 ENCOUNTER — Encounter: Payer: Self-pay | Admitting: Internal Medicine

## 2024-03-08 ENCOUNTER — Other Ambulatory Visit: Payer: Self-pay

## 2024-03-10 ENCOUNTER — Other Ambulatory Visit: Payer: Self-pay

## 2024-03-10 ENCOUNTER — Telehealth: Payer: Self-pay

## 2024-03-10 ENCOUNTER — Encounter: Payer: Self-pay | Admitting: Neurology

## 2024-03-10 NOTE — Telephone Encounter (Signed)
 Pharmacy Patient Advocate Encounter   Received notification from CoverMyMeds that prior authorization for UBRELVY  is required/requested.   Insurance verification completed.   The patient is insured through [employer].   Per test claim: PA required; PA submitted to above mentioned insurance via CoverMyMeds Key/confirmation #[id number] Status is pending

## 2024-03-11 ENCOUNTER — Encounter: Payer: Self-pay | Admitting: Internal Medicine

## 2024-03-15 ENCOUNTER — Other Ambulatory Visit: Payer: Self-pay

## 2024-03-16 ENCOUNTER — Other Ambulatory Visit: Payer: Self-pay

## 2024-03-17 ENCOUNTER — Other Ambulatory Visit: Payer: Self-pay

## 2024-03-17 NOTE — Telephone Encounter (Signed)
 Pharmacy Patient Advocate Encounter  Received notification from [facility] that Prior Authorization for UBRELVY  has been DENIED.  Full denial letter will be uploaded to the media tab. See denial reason below.   PA #/Case ID/Reference #: [id number]  This medicine is covered only if: You have a history of therapeutic failure (after at least 3 migraine episodes and a minimum of a 30-day trial), contraindication or intolerance to two of the following (name and date tried required): (A) Almotriptan (Axert). (B) Eletriptan (Relpax). (C) Frovatriptan (Frova). (D) Naratriptan (Amerge). (E) Rizatriptan (Maxalt/Maxalt MLT). (F) Sumatriptan (Imitrex). (G) Zolmitriptan (Zomig/Zomig-ZMT).

## 2024-03-19 ENCOUNTER — Ambulatory Visit
Admission: RE | Admit: 2024-03-19 | Discharge: 2024-03-19 | Disposition: A | Source: Ambulatory Visit | Attending: Internal Medicine | Admitting: Internal Medicine

## 2024-03-19 ENCOUNTER — Other Ambulatory Visit: Payer: Self-pay

## 2024-03-19 ENCOUNTER — Ambulatory Visit: Admitting: Physician Assistant

## 2024-03-19 DIAGNOSIS — G4489 Other headache syndrome: Secondary | ICD-10-CM

## 2024-03-19 MED ORDER — DOXYCYCLINE HYCLATE 100 MG PO CAPS
ORAL_CAPSULE | Freq: Two times a day (BID) | ORAL | 0 refills | Status: AC
  Filled 2024-03-19: qty 60, 30d supply, fill #0

## 2024-03-19 MED ORDER — TADALAFIL 5 MG PO TABS
ORAL_TABLET | ORAL | 3 refills | Status: AC
  Filled 2024-03-19: qty 90, 90d supply, fill #0

## 2024-03-22 ENCOUNTER — Ambulatory Visit: Payer: Self-pay | Admitting: Internal Medicine

## 2024-03-24 ENCOUNTER — Other Ambulatory Visit: Payer: Self-pay

## 2024-03-25 NOTE — Telephone Encounter (Signed)
 Appeal faxed to [facility] today for processing.

## 2024-03-26 ENCOUNTER — Other Ambulatory Visit: Payer: Self-pay

## 2024-03-30 ENCOUNTER — Other Ambulatory Visit: Payer: Self-pay

## 2024-04-02 ENCOUNTER — Encounter: Admitting: Rehabilitative and Restorative Service Providers"

## 2024-04-08 ENCOUNTER — Ambulatory Visit: Admitting: Physician Assistant

## 2024-04-16 ENCOUNTER — Encounter: Admitting: Rehabilitative and Restorative Service Providers"

## 2024-04-26 ENCOUNTER — Ambulatory Visit: Admitting: Orthopedic Surgery

## 2024-06-25 ENCOUNTER — Ambulatory Visit: Payer: Self-pay | Admitting: Neurology

## 2024-07-16 ENCOUNTER — Ambulatory Visit: Payer: Self-pay | Admitting: Internal Medicine
